# Patient Record
Sex: Male | Born: 1960
Health system: Southern US, Community
[De-identification: ages and names within clinical notes are randomized; demographics above are authoritative.]

## PROBLEM LIST (undated history)

## (undated) DIAGNOSIS — I4892 Unspecified atrial flutter: Secondary | ICD-10-CM

## (undated) DIAGNOSIS — I509 Heart failure, unspecified: Secondary | ICD-10-CM

## (undated) DIAGNOSIS — I10 Essential (primary) hypertension: Secondary | ICD-10-CM

## (undated) DIAGNOSIS — I2510 Atherosclerotic heart disease of native coronary artery without angina pectoris: Secondary | ICD-10-CM

## (undated) DIAGNOSIS — E785 Hyperlipidemia, unspecified: Secondary | ICD-10-CM

## (undated) DIAGNOSIS — I4891 Unspecified atrial fibrillation: Secondary | ICD-10-CM

## (undated) HISTORY — PX: CATARACT EXTRACTION: SUR2

## (undated) HISTORY — DX: Essential (primary) hypertension: I10

## (undated) HISTORY — PX: CARDIAC CATHETERIZATION: SHX172

---

## 2011-08-06 HISTORY — PX: MOUTH SURGERY: SHX715

## 2011-12-21 ENCOUNTER — Ambulatory Visit (INDEPENDENT_AMBULATORY_CARE_PROVIDER_SITE_OTHER): Payer: 59 | Admitting: Internal Medicine

## 2011-12-21 VITALS — BP 137/85 | HR 70 | Temp 98.1°F | Resp 16 | Ht 74.0 in | Wt 203.0 lb

## 2011-12-21 DIAGNOSIS — K122 Cellulitis and abscess of mouth: Secondary | ICD-10-CM

## 2011-12-21 DIAGNOSIS — K047 Periapical abscess without sinus: Secondary | ICD-10-CM

## 2011-12-21 DIAGNOSIS — R519 Headache, unspecified: Secondary | ICD-10-CM

## 2011-12-21 DIAGNOSIS — G501 Atypical facial pain: Secondary | ICD-10-CM

## 2011-12-21 MED ORDER — AMOXICILLIN 500 MG PO CAPS: 1000.0000 mg | ORAL_CAPSULE | Freq: Two times a day (BID) | ORAL | Status: AC

## 2011-12-21 MED ORDER — HYDROCODONE-ACETAMINOPHEN 5-500 MG PO TABS: 1.0000 | ORAL_TABLET | Freq: Three times a day (TID) | ORAL | Status: AC | PRN

## 2011-12-21 NOTE — Progress Notes (Signed)
  Subjective:    Patient ID: Christopher Gutierrez, male    DOB: 05-13-1961, 51 y.o.   MRN: [id number]  HPI C/o of abrupt onset pain L upper tooth with facial swelling.   Review of Systems     Objective:   Physical Exam Swollen tender R upper face Broken and abscessed tooth       Assessment & Plan:  See dentist Meds

## 2012-06-11 ENCOUNTER — Ambulatory Visit: Payer: 59

## 2012-06-11 ENCOUNTER — Ambulatory Visit (INDEPENDENT_AMBULATORY_CARE_PROVIDER_SITE_OTHER): Payer: 59 | Admitting: Internal Medicine

## 2012-06-11 VITALS — BP 144/92 | HR 69 | Temp 98.4°F | Resp 18 | Ht 73.75 in | Wt 200.0 lb

## 2012-06-11 DIAGNOSIS — M546 Pain in thoracic spine: Secondary | ICD-10-CM

## 2012-06-11 DIAGNOSIS — F17200 Nicotine dependence, unspecified, uncomplicated: Secondary | ICD-10-CM

## 2012-06-11 MED ORDER — HYDROCODONE-ACETAMINOPHEN 5-325 MG PO TABS: 1.0000 | ORAL_TABLET | Freq: Four times a day (QID) | ORAL | Status: DC | PRN

## 2012-06-11 MED ORDER — METHOCARBAMOL 750 MG PO TABS: 750.0000 mg | ORAL_TABLET | Freq: Four times a day (QID) | ORAL | Status: DC

## 2012-06-11 NOTE — Progress Notes (Signed)
  Subjective:    Patient ID: Christopher Gutierrez, male    DOB: January 24, 1961, 51 y.o.   MRN: [id number]  HPI Lifting about 1 mo ago and felt pain under right shoulder blade. Pain has persisted and gradually worsened. Has occ tingling into right arm but has not had neck pain. No weakness. Smoker, no hemoptysis.   Review of Systems     Objective:   Physical Exam  Vitals reviewed. Constitutional: He appears well-developed and well-nourished. No distress.  HENT:  Nose: Nose normal.  Musculoskeletal:       Thoracic back: He exhibits tenderness, bony tenderness, pain and spasm. He exhibits normal range of motion and no deformity.       Tender to palpate at and under right scapula.  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

## 2012-06-11 NOTE — Patient Instructions (Signed)
Strain thor Thoracic Strain You have injured the muscles or tendons that attach to the upper part of your back behind your chest. This injury is called a thoracic strain, thoracic sprain, or mid-back strain.  CAUSES  The cause of thoracic strain varies. A less severe injury involves pulling a muscle or tendon without tearing it. A more severe injury involves tearing (rupturing) a muscle or tendon. With less severe injuries, there may be little loss of strength. Sometimes, there are breaks (fractures) in the bones to which the muscles are attached. These fractures are rare, unless there was a direct hit (trauma) or you have weak bones due to osteoporosis or age. Longstanding strains may be caused by overuse or improper form during certain movements. Obesity can also increase your risk for back injuries. Sudden strains may occur due to injury or not warming up properly before exercise. Often, there is no obvious cause for a thoracic strain. SYMPTOMS  The main symptom is pain, especially with movement, such as during exercise. DIAGNOSIS  Your caregiver can usually tell what is wrong by taking an X-ray and doing a physical exam. TREATMENT   Physical therapy may be helpful for recovery. Your caregiver can give you exercises to do or refer you to a physical therapist after your pain improves.  After your pain improves, strengthening and conditioning programs appropriate for your sport or occupation may be helpful.  Always warm up before physical activities or athletics. Stretching after physical activity may also help.  Certain over-the-counter medicines may also help. Ask your caregiver if there are medicines that would help you. If this is your first thoracic strain injury, proper care and proper healing time before starting activities should prevent long-term problems. Torn ligaments and tendons require as long to heal as broken bones. Average healing times may be only 1 week for a mild strain. For  torn muscles and tendons, healing time may be up to 6 weeks to 2 months. HOME CARE INSTRUCTIONS   Apply ice to the injured area. Ice massages may also be used as directed.  Put ice in a plastic bag.  Place a towel between your skin and the bag.  Leave the ice on for 15 to 20 minutes, 3 to 4 times a day, for the first 2 days.  Only take over-the-counter or prescription medicines for pain, discomfort, or fever as directed by your caregiver.  Keep your appointments for physical therapy if this was prescribed.  Use wraps and back braces as instructed. SEEK IMMEDIATE MEDICAL CARE IF:   You have an increase in bruising, swelling, or pain.  Your pain has not improved with medicines.  You develop new shortness of breath, chest pain, or fever.  Problems seem to be getting worse rather than better. MAKE SURE YOU:   Understand these instructions.  Will watch your condition.  Will get help right away if you are not doing well or get worse. Document Released: 10/12/2003 Document Revised: 10/14/2011 Document Reviewed: 09/07/2010 [facility] Patient Information 2013 [place], [place].

## 2012-08-10 ENCOUNTER — Ambulatory Visit (INDEPENDENT_AMBULATORY_CARE_PROVIDER_SITE_OTHER): Payer: 59 | Admitting: Physician Assistant

## 2012-08-10 VITALS — BP 125/88 | HR 103 | Temp 98.6°F | Resp 18 | Ht 74.5 in | Wt 197.0 lb

## 2012-08-10 DIAGNOSIS — E860 Dehydration: Secondary | ICD-10-CM

## 2012-08-10 DIAGNOSIS — R197 Diarrhea, unspecified: Secondary | ICD-10-CM

## 2012-08-10 DIAGNOSIS — R1084 Generalized abdominal pain: Secondary | ICD-10-CM

## 2012-08-10 DIAGNOSIS — R112 Nausea with vomiting, unspecified: Secondary | ICD-10-CM

## 2012-08-10 LAB — COMPREHENSIVE METABOLIC PANEL
ALT: 33 U/L (ref 0–53)
AST: 52 U/L — ABNORMAL HIGH (ref 0–37)
Albumin: 4.7 g/dL (ref 3.5–5.2)
Alkaline Phosphatase: 70 U/L (ref 39–117)
BUN: 20 mg/dL (ref 6–23)
CO2: 27 mEq/L (ref 19–32)
Calcium: 9.3 mg/dL (ref 8.4–10.5)
Chloride: 96 mEq/L (ref 96–112)
Creat: 1.42 mg/dL — ABNORMAL HIGH (ref 0.50–1.35)
Glucose, Bld: 109 mg/dL — ABNORMAL HIGH (ref 70–99)
Potassium: 4.2 mEq/L (ref 3.5–5.3)
Sodium: 135 mEq/L (ref 135–145)
Total Bilirubin: 2.5 mg/dL — ABNORMAL HIGH (ref 0.3–1.2)
Total Protein: 7.2 g/dL (ref 6.0–8.3)

## 2012-08-10 MED ORDER — ONDANSETRON 4 MG PO TBDP: 4.0000 mg | ORAL_TABLET | Freq: Three times a day (TID) | ORAL | Status: DC | PRN

## 2012-08-10 MED ORDER — ONDANSETRON 4 MG PO TBDP
8.0000 mg | ORAL_TABLET | Freq: Once | ORAL | Status: AC
  Administered 2012-08-10: 8 mg via ORAL

## 2012-08-10 MED ORDER — DICYCLOMINE HCL 10 MG PO CAPS: 10.0000 mg | ORAL_CAPSULE | Freq: Three times a day (TID) | ORAL | Status: DC

## 2012-08-10 NOTE — Progress Notes (Signed)
   [address], [place] [address]   Phone [phone]  Subjective:    Patient ID: Christopher Gutierrez, male    DOB: 03-Jul-1961, 52 y.o.   MRN: [id number]  HPI Pt presents to clinic with nausea vomiting and diarrhea since Sat.  His last episode of vomiting was yesterday am and diarrhea was yesterday evening.  He has not been able to drink much and he tried to eat a small sandwich yesterday but had diarrhea right afterwards.  His abdomin is cramping terribly to the point where it hurts him to move.  He has dizziness when changing positions.  He has taken no OTC medications.  Review of Systems  Constitutional: Negative for fever and chills.  Gastrointestinal: Positive for nausea, vomiting, abdominal pain (cramping) and diarrhea. Negative for blood in stool.  Neurological: Positive for dizziness.       Objective:   Physical Exam  Vitals reviewed. Constitutional: He is oriented to person, place, and time. He appears well-developed and well-nourished.  HENT:  Head: Normocephalic and atraumatic.  Right Ear: External ear normal.  Left Ear: External ear normal.  Eyes: Conjunctivae normal are normal.  Neck: Neck supple.  Cardiovascular: Normal rate, regular rhythm and normal heart sounds.   Pulmonary/Chest: Effort normal and breath sounds normal.  Abdominal: Soft. There is tenderness (generalized). There is no rebound and no guarding.  Neurological: He is alert and oriented to person, place, and time.  Skin: Skin is warm and dry.  Psychiatric: He has a normal mood and affect. His behavior is normal. Judgment and thought content normal.    IV started - pt got 2l NS and feels much better.    Assessment & Plan:   1. Nausea & vomiting  ondansetron (ZOFRAN-ODT) disintegrating tablet 8 mg, ondansetron (ZOFRAN ODT) 4 MG disintegrating tablet  2. Diarrhea  Comprehensive metabolic panel  3. Abdominal cramping, generalized  dicyclomine (BENTYL) 10 MG capsule   Note given for work.  Pt home to  rest.  Push fluids and the 'BRAT' diet is suggested, then progress to diet as tolerated as symptoms abate. Call if bloody stools, persistent diarrhea, vomiting, fever or abdominal pain.

## 2012-08-10 NOTE — Patient Instructions (Signed)
Push fluids - start with ice and then go to clear liquids..  Advanced diet as tolerated - starting with bland foods.  B.R.A.T. Diet Your doctor has recommended the B.R.A.T. diet for you or your child until the condition improves. This is often used to help control diarrhea and vomiting symptoms. If you or your child can tolerate clear liquids, you may have:  Bananas.   Rice.   Applesauce.   Toast (and other simple starches such as crackers, potatoes, noodles).  Be sure to avoid dairy products, meats, and fatty foods until symptoms are better. Fruit juices such as apple, grape, and prune juice can make diarrhea worse. Avoid these. Continue this diet for 2 days or as instructed by your caregiver. Document Released: 07/22/2005 Document Revised: 07/11/2011 Document Reviewed: 01/08/2007 [facility] Patient Information 2012 [place], [place].

## 2012-08-14 NOTE — Addendum Note (Signed)
Addended by: Morrell Riddle on: 08/14/2012 12:36 PM   Modules accepted: Orders

## 2012-08-19 ENCOUNTER — Other Ambulatory Visit (INDEPENDENT_AMBULATORY_CARE_PROVIDER_SITE_OTHER): Payer: 59

## 2012-08-19 VITALS — BP 145/92 | HR 87

## 2012-08-19 DIAGNOSIS — R197 Diarrhea, unspecified: Secondary | ICD-10-CM

## 2012-08-19 LAB — COMPREHENSIVE METABOLIC PANEL
ALT: 36 U/L (ref 0–53)
AST: 20 U/L (ref 0–37)
Albumin: 4.7 g/dL (ref 3.5–5.2)
Alkaline Phosphatase: 67 U/L (ref 39–117)
BUN: 11 mg/dL (ref 6–23)
CO2: 26 mEq/L (ref 19–32)
Calcium: 9.7 mg/dL (ref 8.4–10.5)
Chloride: 104 mEq/L (ref 96–112)
Creat: 1.01 mg/dL (ref 0.50–1.35)
Glucose, Bld: 100 mg/dL — ABNORMAL HIGH (ref 70–99)
Potassium: 4.8 mEq/L (ref 3.5–5.3)
Sodium: 138 mEq/L (ref 135–145)
Total Bilirubin: 0.9 mg/dL (ref 0.3–1.2)
Total Protein: 6.6 g/dL (ref 6.0–8.3)

## 2014-08-05 DIAGNOSIS — Z961 Presence of intraocular lens: Secondary | ICD-10-CM

## 2014-08-05 HISTORY — DX: Presence of intraocular lens: Z96.1

## 2014-10-01 ENCOUNTER — Ambulatory Visit (INDEPENDENT_AMBULATORY_CARE_PROVIDER_SITE_OTHER): Payer: 59 | Admitting: Family Medicine

## 2014-10-01 ENCOUNTER — Other Ambulatory Visit: Payer: Self-pay | Admitting: Physician Assistant

## 2014-10-01 ENCOUNTER — Ambulatory Visit (INDEPENDENT_AMBULATORY_CARE_PROVIDER_SITE_OTHER): Payer: 59

## 2014-10-01 VITALS — BP 150/102 | HR 110 | Temp 98.4°F | Resp 16 | Ht 74.0 in | Wt 204.0 lb

## 2014-10-01 DIAGNOSIS — R0781 Pleurodynia: Secondary | ICD-10-CM

## 2014-10-01 DIAGNOSIS — R17 Unspecified jaundice: Secondary | ICD-10-CM

## 2014-10-01 DIAGNOSIS — I10 Essential (primary) hypertension: Secondary | ICD-10-CM

## 2014-10-01 LAB — COMPREHENSIVE METABOLIC PANEL
ALT: 24 U/L (ref 0–53)
AST: 21 U/L (ref 0–37)
Albumin: 4.6 g/dL (ref 3.5–5.2)
Alkaline Phosphatase: 79 U/L (ref 39–117)
BUN: 10 mg/dL (ref 6–23)
CO2: 21 mEq/L (ref 19–32)
Calcium: 10.3 mg/dL (ref 8.4–10.5)
Chloride: 100 mEq/L (ref 96–112)
Creat: 1.01 mg/dL (ref 0.50–1.35)
Glucose, Bld: 92 mg/dL (ref 70–99)
Potassium: 4.6 mEq/L (ref 3.5–5.3)
Sodium: 135 mEq/L (ref 135–145)
Total Bilirubin: 1.9 mg/dL — ABNORMAL HIGH (ref 0.2–1.2)
Total Protein: 7.3 g/dL (ref 6.0–8.3)

## 2014-10-01 MED ORDER — CYCLOBENZAPRINE HCL 5 MG PO TABS: 5.0000 mg | ORAL_TABLET | Freq: Three times a day (TID) | ORAL | Status: DC | PRN

## 2014-10-01 MED ORDER — HYDROCHLOROTHIAZIDE 12.5 MG PO TABS: 12.5000 mg | ORAL_TABLET | Freq: Every day | ORAL | Status: DC

## 2014-10-01 MED ORDER — NAPROXEN 500 MG PO TABS
500.0000 mg | ORAL_TABLET | Freq: Two times a day (BID) | ORAL | Status: DC
Start: 2014-10-01 — End: 2017-09-29

## 2014-10-01 NOTE — Progress Notes (Signed)
Subjective:    Patient ID: Christopher Gutierrez, male    DOB: 1961-04-02, 54 y.o.   MRN: [id number]  HPI  This is a 54 year old male presenting after a fall. States he tripped last night and his right posterior ribs on a corner of a dresser. Having pain with deep inspiration and movement. Pain described as throbbing and aching. Never had broken bone before. Has tried ice and helped some. He did not hit his head, no LOC. Pt does not have a primary doctor. BP is elevated here today. Never been on anything for BP. Pt is a current every day smoker - 1 ppd. Drinks a 6 pack every once in a while. No illicit drug use. Denies CP, palpitations, headache, dizziness.   Review of Systems  Constitutional: Negative for fever and chills.  Eyes: Negative for visual disturbance.  Respiratory: Negative for shortness of breath.   Cardiovascular: Negative for chest pain, palpitations and leg swelling.  Gastrointestinal: Negative for nausea and vomiting.  Musculoskeletal: Positive for myalgias and arthralgias.  Skin: Positive for color change.  Neurological: Negative for dizziness, weakness, numbness and headaches.    There are no active problems to display for this patient.  Prior to Admission medications   Not on File   No Known Allergies  Patient's social and family history were reviewed.     Objective:   Physical Exam  Constitutional: He is oriented to person, place, and time. He appears well-developed and well-nourished. No distress.  HENT:  Head: Normocephalic and atraumatic.  Right Ear: Hearing normal.  Left Ear: Hearing normal.  Nose: Nose normal.  Eyes: Conjunctivae and lids are normal. Right eye exhibits no discharge. Left eye exhibits no discharge. No scleral icterus.  Cardiovascular: Regular rhythm, normal heart sounds, intact distal pulses and normal pulses.  Tachycardia present.   Pulmonary/Chest: Effort normal and breath sounds normal. No respiratory distress. He has no decreased breath  sounds. He has no wheezes. He has no rhonchi. He has no rales.  Abdominal: Soft. Normal appearance. There is no tenderness.  Musculoskeletal: Normal range of motion.  Contusion over right posterior ribs. Tender over contusion as well as several inferior ribs Pain with spine rotation and right arm flexion/abduction  Neurological: He is alert and oriented to person, place, and time.  Skin: Skin is warm, dry and intact. No lesion and no rash noted.  Psychiatric: He has a normal mood and affect. His speech is normal and behavior is normal. Thought content normal.   BP 150/102 mmHg  Pulse 110  Temp(Src) 98.4 F (36.9 C) (Oral)  Resp 16  Ht 6\' 2"  (1.88 m)  Wt 204 lb (92.534 kg)  BMI 26.18 kg/m2  SpO2 92%  [facility] reading (PRIMARY) by  Dr. Linna Darner: normal radiograph     Assessment & Plan:  1. Rib pain on right side Naprosyn and flexeril for pain. Radiograph normal, no evidence of fracture. Will return if symptoms not improving in 2 weeks.  - DG Ribs Unilateral W/Chest Right; Future - naproxen (NAPROSYN) 500 MG tablet; Take 1 tablet (500 mg total) by mouth 2 (two) times daily with a meal.  Dispense: 30 tablet; Refill: 0 - cyclobenzaprine (FLEXERIL) 5 MG tablet; Take 1 tablet (5 mg total) by mouth 3 (three) times daily as needed for muscle spasms.  Dispense: 30 tablet; Refill: 0  2. Essential hypertension Repeat BP still 150/102. BP elevated 2 years ago here as well. After discussion decided to start on hctz. Counseled on salt, exercise,  diet, smoking cessation. He will return in 2-3 months for follow up.  - hydrochlorothiazide (HYDRODIURIL) 12.5 MG tablet; Take 1 tablet (12.5 mg total) by mouth daily.  Dispense: 30 tablet; Refill: 3 - Comprehensive metabolic panel   Benjaman Pott. Drenda Freeze, MHS [facility]  10/01/2014

## 2014-10-01 NOTE — Patient Instructions (Signed)
For rib pain, take naprosyn twice a day and flexeril 3 times a day for pain. May apply rotating ice and heat. Start taking BP med daily in the mornings. Buy a BP cuff and taking once a day. You want your BP to be <140/90. Exercise 3-4 times a week. Avoid excess salt in your diet. Start thinking about quitting smoking. Return to see me in 2-3 months for follow up.

## 2014-10-01 NOTE — Progress Notes (Signed)
PA presented the case to me. X-rays were examined and no fracture could be seen. Lung look clear. Discussed treatment plan and agreed upon it.

## 2014-10-03 LAB — BILIRUBIN, FRACTIONATED(TOT/DIR/INDIR)
Bilirubin, Direct: 0.2 mg/dL (ref 0.0–0.3)
Indirect Bilirubin: 1.4 mg/dL — ABNORMAL HIGH (ref 0.2–1.2)
Total Bilirubin: 1.6 mg/dL — ABNORMAL HIGH (ref 0.2–1.2)

## 2014-10-05 ENCOUNTER — Telehealth: Payer: Self-pay

## 2014-10-05 NOTE — Telephone Encounter (Signed)
Dr Linna Darner, your assessment and plan below. Please advise.   1. Rib pain on right side Naprosyn and flexeril for pain. Radiograph normal, no evidence of fracture. Will return if symptoms not improving in 2 weeks.  - DG Ribs Unilateral W/Chest Right; Future - naproxen (NAPROSYN) 500 MG tablet; Take 1 tablet (500 mg total) by mouth 2 (two) times daily with a meal. Dispense: 30 tablet; Refill: 0 - cyclobenzaprine (FLEXERIL) 5 MG tablet; Take 1 tablet (5 mg total) by mouth 3 (three) times daily as needed for muscle spasms. Dispense: 30 tablet; Refill: 0

## 2014-10-05 NOTE — Telephone Encounter (Signed)
Call:  Prescribe Tramadol 50 mg  # 20   Take one every 8 hours if needed for severe pain.  NR  Can continue taking naprosyn   If still needing more pain meds over the next 7-10 days needs to return for a recheck.

## 2014-10-05 NOTE — Telephone Encounter (Signed)
Patient is requesting a different pain medication. Per patient the "Naproxen" has not helped him at all. Patients call back number is [phone]

## 2014-10-06 MED ORDER — TRAMADOL HCL 50 MG PO TABS: 50.0000 mg | ORAL_TABLET | Freq: Three times a day (TID) | ORAL | Status: DC | PRN

## 2014-10-06 NOTE — Telephone Encounter (Signed)
Called pt, left message for pt to call back.

## 2014-10-06 NOTE — Telephone Encounter (Signed)
Spoke with pt, advised message from Dr. Linna Darner. Pt understood. I called in the Tramadol to his pharmacy.

## 2017-09-29 ENCOUNTER — Other Ambulatory Visit: Payer: Self-pay

## 2017-09-29 ENCOUNTER — Encounter: Payer: Self-pay | Admitting: Family Medicine

## 2017-09-29 ENCOUNTER — Ambulatory Visit: Payer: 59 | Admitting: Family Medicine

## 2017-09-29 VITALS — BP 130/88 | HR 95 | Temp 98.0°F | Resp 16 | Ht 74.0 in | Wt 214.2 lb

## 2017-09-29 DIAGNOSIS — J029 Acute pharyngitis, unspecified: Secondary | ICD-10-CM

## 2017-09-29 DIAGNOSIS — J0140 Acute pansinusitis, unspecified: Secondary | ICD-10-CM

## 2017-09-29 DIAGNOSIS — F17200 Nicotine dependence, unspecified, uncomplicated: Secondary | ICD-10-CM

## 2017-09-29 LAB — POCT SKIN KOH: Skin KOH, POC: NEGATIVE

## 2017-09-29 MED ORDER — FLUTICASONE PROPIONATE 50 MCG/ACT NA SUSP: 2.0000 | Freq: Every day | NASAL | 2 refills | Status: DC

## 2017-09-29 MED ORDER — HYDROCODONE-HOMATROPINE 5-1.5 MG/5ML PO SYRP: 5.0000 mL | ORAL_SOLUTION | Freq: Three times a day (TID) | ORAL | 0 refills | Status: DC | PRN

## 2017-09-29 MED ORDER — PSEUDOEPHEDRINE-GUAIFENESIN ER 120-1200 MG PO TB12: 1.0000 | ORAL_TABLET | Freq: Two times a day (BID) | ORAL | 0 refills | Status: DC

## 2017-09-29 MED ORDER — AMOXICILLIN-POT CLAVULANATE 875-125 MG PO TABS: 1.0000 | ORAL_TABLET | Freq: Two times a day (BID) | ORAL | 0 refills | Status: DC

## 2017-09-29 MED ORDER — IPRATROPIUM BROMIDE 0.03 % NA SOLN: 2.0000 | Freq: Four times a day (QID) | NASAL | 1 refills | Status: DC

## 2017-09-29 NOTE — Patient Instructions (Addendum)
Feel free to make an appointment to evaluate your back pain at your convenience.  You might also consider using your health insurance benefits and come in for your FREE complete physical exam your insurance covers once a year.   is now offering annual lung cancer screening by low-dose CT scan.  This would be a FREE appointment with the lung cancer screening nurse followed by a FREE lung CT scan AFTER you turn 57 yo.  Call the lung cancer screening nurse navigators at [phone] to learn more about this and get scheduled in October.  Hot showers or breathing in steam may help loosen the congestion.  Using a netti pot or sinus rinse is also likely to help you feel better and keep this from progressing.  Use the atrovent nasal spray as needed throughout the day and use the fluticasone nasal spray every night before bed for at least 2 weeks.  I recommend augmenting with 12 hr Mucinex-D to help you move out the congestion.  If no improvement or you are getting worse, come back as you might need a course of steroids but hopefully with all of the above, you can avoid it.   IF you received an x-ray today, you will receive an invoice from [facility]. Please contact [facility] at [phone] with questions or concerns regarding your invoice.   IF you received labwork today, you will receive an invoice from [place]. Please contact [facility] at [phone] with questions or concerns regarding your invoice.   Our billing staff will not be able to assist you with questions regarding bills from these companies.  You will be contacted with the lab results as soon as they are available. The fastest way to get your results is to activate your My Chart account. Instructions are located on the last page of this paperwork. If you have not heard from [place] regarding the results in 2 weeks, please contact this office.      Sinusitis, Adult Sinusitis is soreness and inflammation of your  sinuses. Sinuses are hollow spaces in the bones around your face. Your sinuses are located:  Around your eyes.  In the middle of your forehead.  Behind your nose.  In your cheekbones.  Your sinuses and nasal passages are lined with a stringy fluid (mucus). Mucus normally drains out of your sinuses. When your nasal tissues become inflamed or swollen, the mucus can become trapped or blocked so air cannot flow through your sinuses. This allows bacteria, viruses, and funguses to grow, which leads to infection. Sinusitis can develop quickly and last for 7?10 days (acute) or for more than 12 weeks (chronic). Sinusitis often develops after a cold. What are the causes? This condition is caused by anything that creates swelling in the sinuses or stops mucus from draining, including:  Allergies.  Asthma.  Bacterial or viral infection.  Abnormally shaped bones between the nasal passages.  Nasal growths that contain mucus (nasal polyps).  Narrow sinus openings.  Pollutants, such as chemicals or irritants in the air.  A foreign object stuck in the nose.  A fungal infection. This is rare.  What increases the risk? The following factors may make you more likely to develop this condition:  Having allergies or asthma.  Having had a recent cold or respiratory tract infection.  Having structural deformities or blockages in your nose or sinuses.  Having a weak immune system.  Doing a lot of swimming or diving.  Overusing nasal sprays.  Smoking.  What are  the signs or symptoms? The main symptoms of this condition are pain and a feeling of pressure around the affected sinuses. Other symptoms include:  Upper toothache.  Earache.  Headache.  Bad breath.  Decreased sense of smell and taste.  A cough that may get worse at night.  Fatigue.  Fever.  Thick drainage from your nose. The drainage is often green and it may contain pus (purulent).  Stuffy nose or  congestion.  Postnasal drip. This is when extra mucus collects in the throat or back of the nose.  Swelling and warmth over the affected sinuses.  Sore throat.  Sensitivity to light.  How is this diagnosed? This condition is diagnosed based on symptoms, a medical history, and a physical exam. To find out if your condition is acute or chronic, your health care provider may:  Look in your nose for signs of nasal polyps.  Tap over the affected sinus to check for signs of infection.  View the inside of your sinuses using an imaging device that has a light attached (endoscope).  If your health care provider suspects that you have chronic sinusitis, you may also:  Be tested for allergies.  Have a sample of mucus taken from your nose (nasal culture) and checked for bacteria.  Have a mucus sample examined to see if your sinusitis is related to an allergy.  If your sinusitis does not respond to treatment and it lasts longer than 8 weeks, you may have an MRI or CT scan to check your sinuses. These scans also help to determine how severe your infection is. In rare cases, a bone biopsy may be done to rule out more serious types of fungal sinus disease. How is this treated? Treatment for sinusitis depends on the cause and whether your condition is chronic or acute. If a virus is causing your sinusitis, your symptoms will go away on their own within 10 days. You may be given medicines to relieve your symptoms, including:  Topical nasal decongestants. They shrink swollen nasal passages and let mucus drain from your sinuses.  Antihistamines. These drugs block inflammation that is triggered by allergies. This can help to ease swelling in your nose and sinuses.  Topical nasal corticosteroids. These are nasal sprays that ease inflammation and swelling in your nose and sinuses.  Nasal saline washes. These rinses can help to get rid of thick mucus in your nose.  If your condition is caused by  bacteria, you will be given an antibiotic medicine. If your condition is caused by a fungus, you will be given an antifungal medicine. Surgery may be needed to correct underlying conditions, such as narrow nasal passages. Surgery may also be needed to remove polyps. Follow these instructions at home: Medicines  Take, use, or apply over-the-counter and prescription medicines only as told by your health care provider. These may include nasal sprays.  If you were prescribed an antibiotic medicine, take it as told by your health care provider. Do not stop taking the antibiotic even if you start to feel better. Hydrate and Humidify  Drink enough water to keep your urine clear or pale yellow. Staying hydrated will help to thin your mucus.  Use a cool mist humidifier to keep the humidity level in your home above 50%.  Inhale steam for 10-15 minutes, 3-4 times a day or as told by your health care provider. You can do this in the bathroom while a hot shower is running.  Limit your exposure to cool or dry  air. Rest  Rest as much as possible.  Sleep with your head raised (elevated).  Make sure to get enough sleep each night. General instructions  Apply a warm, moist washcloth to your face 3-4 times a day or as told by your health care provider. This will help with discomfort.  Wash your hands often with soap and water to reduce your exposure to viruses and other germs. If soap and water are not available, use hand sanitizer.  Do not smoke. Avoid being around people who are smoking (secondhand smoke).  Keep all follow-up visits as told by your health care provider. This is important. Contact a health care provider if:  You have a fever.  Your symptoms get worse.  Your symptoms do not improve within 10 days. Get help right away if:  You have a severe headache.  You have persistent vomiting.  You have pain or swelling around your face or eyes.  You have vision problems.  You  develop confusion.  Your neck is stiff.  You have trouble breathing. This information is not intended to replace advice given to you by your health care provider. Make sure you discuss any questions you have with your health care provider. Document Released: 07/22/2005 Document Revised: 03/17/2016 Document Reviewed: 05/17/2015 Elsevier Interactive Patient Education  2018 [place] Risks of Smoking Smoking cigarettes is very bad for your health. Tobacco smoke has over 200 known poisons in it. It contains the poisonous gases nitrogen oxide and carbon monoxide. There are over 60 chemicals in tobacco smoke that cause cancer. Smoking is difficult to quit because a chemical in tobacco, called nicotine, causes addiction or dependence. When you smoke and inhale, nicotine is absorbed rapidly into the bloodstream through your lungs. Both inhaled and non-inhaled nicotine may be addictive. What are the risks of cigarette smoke? Cigarette smokers have an increased risk of many serious medical problems, including:  Lung cancer.  Lung disease, such as pneumonia, bronchitis, and emphysema.  Chest pain (angina) and heart attack because the heart is not getting enough oxygen.  Heart disease and peripheral blood vessel disease.  High blood pressure (hypertension).  Stroke.  Oral cancer, including cancer of the lip, mouth, or voice box.  Bladder cancer.  Pancreatic cancer.  Cervical cancer.  Pregnancy complications, including premature birth.  Stillbirths and smaller newborn babies, birth defects, and genetic damage to sperm.  Early menopause.  Lower estrogen level for women.  Infertility.  Facial wrinkles.  Blindness.  Increased risk of broken bones (fractures).  Senile dementia.  Stomach ulcers and internal bleeding.  Delayed wound healing and increased risk of complications during surgery.  Even smoking lightly shortens your life expectancy by several  years.  Because of secondhand smoke exposure, children of smokers have an increased risk of the following:  Sudden infant death syndrome (SIDS).  Respiratory infections.  Lung cancer.  Heart disease.  Ear infections.  What are the benefits of quitting? There are many health benefits of quitting smoking. Here are some of them:  Within days of quitting smoking, your risk of having a heart attack decreases, your blood flow improves, and your lung capacity improves. Blood pressure, pulse rate, and breathing patterns start returning to normal soon after quitting.  Within months, your lungs may clear up completely.  Quitting for 10 years reduces your risk of developing lung cancer and heart disease to almost that of a nonsmoker.  People who quit may see an improvement in their overall quality of life.  How do I quit smoking? Smoking is an addiction with both physical and psychological effects, and longtime habits can be hard to change. Your health care provider can recommend:  Programs and community resources, which may include group support, education, or talk therapy.  Prescription medicines to help reduce cravings.  Nicotine replacement products, such as patches, gum, and nasal sprays. Use these products only as directed. Do not replace cigarette smoking with electronic cigarettes, which are commonly called e-cigarettes. The safety of e-cigarettes is not known, and some may contain harmful chemicals.  A combination of two or more of these methods.  Where to find more information:  American Lung Association: www.lung.org  American Cancer Society: www.cancer.org Summary  Smoking cigarettes is very bad for your health. Cigarette smokers have an increased risk of many serious medical problems, including several cancers, heart disease, and stroke.  Smoking is an addiction with both physical and psychological effects, and longtime habits can be hard to change.  By stopping right  away, you can greatly reduce the risk of medical problems for you and your family.  To help you quit smoking, your health care provider can recommend programs, community resources, prescription medicines, and nicotine replacement products such as patches, gum, and nasal sprays. This information is not intended to replace advice given to you by your health care provider. Make sure you discuss any questions you have with your health care provider. Document Released: 08/29/2004 Document Revised: 07/26/2016 Document Reviewed: 07/26/2016 Elsevier Interactive Patient Education  2017 [employer].

## 2017-09-29 NOTE — Progress Notes (Signed)
Subjective:  By signing my name below, I, Essence Howell, attest that this documentation has been prepared under the direction and in the presence of Delman Cheadle, MD Electronically Signed: Ladene Artist, ED Scribe 09/29/2017 at 9:28 AM.   Patient ID: Christopher Gutierrez, male    DOB: May 22, 1961, 57 y.o.   MRN: [id number]  Chief Complaint  Patient presents with  . URI    cough, nasal congestion yellowish mucus, hot/cold chills more at night , started last wednesday    HPI Christopher Gutierrez is a 57 y.o. male who presents to Primary Care at [facility] complaining of gradually worsening productive cough with yellow mucus onset 2 days ago. Pt reports associated symptoms of nasal congestion with clear/yellow discolored discharge x 5 days, intermittent sore throat at night, L-sided sinus pressure, night sweats, chills last night. Pt has tried Sudafed with some relief initially, none now. No antibiotic allergies. He is smoking ~1 ppd.  History reviewed. No pertinent past medical history.   Current Outpatient Medications on File Prior to Visit  Medication Sig Dispense Refill  . cyclobenzaprine (FLEXERIL) 5 MG tablet Take 1 tablet (5 mg total) by mouth 3 (three) times daily as needed for muscle spasms. (Patient not taking: Reported on 09/29/2017) 30 tablet 0  . hydrochlorothiazide (HYDRODIURIL) 12.5 MG tablet Take 1 tablet (12.5 mg total) by mouth daily. (Patient not taking: Reported on 09/29/2017) 30 tablet 3  . naproxen (NAPROSYN) 500 MG tablet Take 1 tablet (500 mg total) by mouth 2 (two) times daily with a meal. (Patient not taking: Reported on 09/29/2017) 30 tablet 0  . traMADol (ULTRAM) 50 MG tablet Take 1 tablet (50 mg total) by mouth every 8 (eight) hours as needed. (Patient not taking: Reported on 09/29/2017) 20 tablet 0   No current facility-administered medications on file prior to visit.    No Known Allergies   Past Surgical History:  Procedure Laterality Date  . MOUTH SURGERY  2013   Family History    Problem Relation Age of Onset  . Gout Brother   . Hypertension Brother   . COPD Maternal Uncle   . Birth defects Paternal Aunt    Social History   Socioeconomic History  . Marital status: Divorced    Spouse name: None  . Number of children: None  . Years of education: None  . Highest education level: None  Social Needs  . Financial resource strain: None  . Food insecurity - worry: None  . Food insecurity - inability: None  . Transportation needs - medical: None  . Transportation needs - non-medical: None  Occupational History  . None  Tobacco Use  . Smoking status: Current Every Day Smoker    Packs/day: 20.00    Types: Cigarettes  . Smokeless tobacco: Never Used  Substance and Sexual Activity  . Alcohol use: Yes    Comment: occassionally  . Drug use: No  . Sexual activity: Yes  Other Topics Concern  . None  Social History Narrative  . None   Depression screen [facility] 2/9 09/29/2017  Decreased Interest 0  Down, Depressed, Hopeless 0  PHQ - 2 Score 0    Review of Systems  Constitutional: Positive for chills and diaphoresis.  HENT: Positive for congestion, sinus pressure and sore throat.   Respiratory: Positive for cough.       Objective:   Physical Exam  Constitutional: He is oriented to person, place, and time. He appears well-developed and well-nourished. No distress.  HENT:  Head: Normocephalic and  atraumatic.  Right Ear: Tympanic membrane is injected and erythematous.  Left Ear: Tympanic membrane normal.  Nose: Rhinorrhea (and erythema) present.  Mouth/Throat: Uvula is midline. Mucous membranes are dry. No trismus in the jaw. Posterior oropharyngeal erythema (severe) present.  Tongue red, dry with tan adherent coating spotted throughout but pt asymptomatic with neg KOH on scraping.  Eyes: Conjunctivae and EOM are normal.  Neck: Neck supple. No tracheal deviation present.  Cardiovascular: Regular rhythm, S1 normal, S2 normal and normal heart sounds.  Tachycardia present.  No murmur heard. Pulmonary/Chest: Effort normal and breath sounds normal. No respiratory distress.  Lungs are clear.  Musculoskeletal: Normal range of motion.  Lymphadenopathy:       Head (right side): Submandibular and tonsillar adenopathy present.    He has cervical adenopathy (large R lower anterior cervical chain).  Neurological: He is alert and oriented to person, place, and time.  Skin: Skin is warm and dry.  Psychiatric: He has a normal mood and affect. His behavior is normal.  Nursing note and vitals reviewed.  BP 130/88   Pulse 95   Temp 98 F (36.7 C)   Resp 16   Ht 6\' 2"  (1.88 m)   Wt 214 lb 3.2 oz (97.2 kg)   SpO2 97%   BMI 27.50 kg/m     Results for orders placed or performed in visit on 09/29/17  POCT Skin KOH  Result Value Ref Range   Skin KOH, POC Negative Negative  From tongue scraping.  Assessment & Plan:   1. Acute non-recurrent pansinusitis - Augmentin.  Sig Rt-sided cervical adenopathy but Rt ear appears infected. Advised pt that adenopathy should resolve w/in 2 weeks and if not, then needs to RTC for further evaluation w/ throat clx, cbc, cxr, etc.  2. Pharyngitis, unspecified etiology - not severe - exam appeared worse than sxs, so question of thrush but KOH negative and no sxs so watchful waiting  3. Tobacco use disorder - encouraged cessation - pt not interested at this time though knows he should   Pt identifies [place] as his PCP - no interest in preventative health care - only comes here when his sxs are severe. Does not seek medical care elsewhere. Encouraged pt to take advantage of the free health insurance benefits he is paying for like CPE and cancer screening.  Orders Placed This Encounter  Procedures  . POCT Skin KOH    Meds ordered this encounter  Medications  . amoxicillin-clavulanate (AUGMENTIN) 875-125 MG tablet    Sig: Take 1 tablet by mouth 2 (two) times daily.    Dispense:  20 tablet    Refill:  0  .  fluticasone (FLONASE) 50 MCG/ACT nasal spray    Sig: Place 2 sprays into both nostrils at bedtime.    Dispense:  16 g    Refill:  2  . ipratropium (ATROVENT) 0.03 % nasal spray    Sig: Place 2 sprays into the nose 4 (four) times daily.    Dispense:  30 mL    Refill:  1  . Pseudoephedrine-Guaifenesin (MUCINEX D MAX STRENGTH) [phone] MG TB12    Sig: Take 1 tablet by mouth 2 (two) times daily.    Dispense:  28 each    Refill:  0  . DISCONTD: HYDROcodone-homatropine (HYCODAN) 5-1.5 MG/5ML syrup    Sig: Take 5 mLs by mouth every 8 (eight) hours as needed for cough.    Dispense:  180 mL    Refill:  0  . HYDROcodone-homatropine (  HYCODAN) 5-1.5 MG/5ML syrup    Sig: Take 5 mLs by mouth every 8 (eight) hours as needed for cough.    Dispense:  180 mL    Refill:  0    I personally performed the services described in this documentation, which was scribed in my presence. The recorded information has been reviewed and considered, and addended by me as needed.   Delman Cheadle, M.D.  Primary Care at [facility] [address] [place], [place] [address] [phone] phone [phone] fax  09/29/17 10:05 AM

## 2020-06-16 DIAGNOSIS — M47816 Spondylosis without myelopathy or radiculopathy, lumbar region: Secondary | ICD-10-CM | POA: Insufficient documentation

## 2020-10-12 ENCOUNTER — Other Ambulatory Visit: Payer: Self-pay

## 2020-10-12 ENCOUNTER — Ambulatory Visit (INDEPENDENT_AMBULATORY_CARE_PROVIDER_SITE_OTHER): Payer: 59 | Admitting: Family Medicine

## 2020-10-12 ENCOUNTER — Encounter: Payer: Self-pay | Admitting: Family Medicine

## 2020-10-12 VITALS — BP 151/100 | HR 95 | Temp 97.6°F | Wt 201.0 lb

## 2020-10-12 DIAGNOSIS — F1020 Alcohol dependence, uncomplicated: Secondary | ICD-10-CM

## 2020-10-12 DIAGNOSIS — I483 Typical atrial flutter: Secondary | ICD-10-CM | POA: Diagnosis not present

## 2020-10-12 DIAGNOSIS — G6289 Other specified polyneuropathies: Secondary | ICD-10-CM | POA: Diagnosis not present

## 2020-10-12 DIAGNOSIS — I499 Cardiac arrhythmia, unspecified: Secondary | ICD-10-CM | POA: Diagnosis not present

## 2020-10-12 DIAGNOSIS — G629 Polyneuropathy, unspecified: Secondary | ICD-10-CM | POA: Insufficient documentation

## 2020-10-12 DIAGNOSIS — I4891 Unspecified atrial fibrillation: Secondary | ICD-10-CM

## 2020-10-12 DIAGNOSIS — F17200 Nicotine dependence, unspecified, uncomplicated: Secondary | ICD-10-CM

## 2020-10-12 DIAGNOSIS — I4892 Unspecified atrial flutter: Secondary | ICD-10-CM | POA: Insufficient documentation

## 2020-10-12 DIAGNOSIS — I10 Essential (primary) hypertension: Secondary | ICD-10-CM

## 2020-10-12 MED ORDER — GABAPENTIN 300 MG PO CAPS: ORAL_CAPSULE | ORAL | 3 refills | Status: DC

## 2020-10-12 MED ORDER — METOPROLOL TARTRATE 50 MG PO TABS: 50.0000 mg | ORAL_TABLET | Freq: Two times a day (BID) | ORAL | 3 refills | Status: DC

## 2020-10-12 NOTE — Assessment & Plan Note (Signed)
Could be due to diabetes, chronic EtOH use or neurogenic in nature.  Check a1c. Check folate and thiamine levels.  May need to consider repeat MRI Start gabapentin 300mg  qhs for first week then may increase to TID.

## 2020-10-12 NOTE — Assessment & Plan Note (Addendum)
A. Flutter with RVR on EKG.  I do not think he needs hospitalization at this time as he is asymptomatic with this.  He has multiple risk factors including HTN, alcohol dependence, nicotine dependence.  He may also have OSA as his hgb is elevated.   Start metoprolol 50mg  BID for rate control.   CHA2DS2-VASC: 1, if A1c returns elevated this would increase his risk.  He will start 81mg  ASA for now.  Referral placed to cardiology.

## 2020-10-12 NOTE — Progress Notes (Signed)
Christopher Gutierrez - 60 y.o. male MRN [id number]  Date of birth: 08/14/1960  Subjective Chief Complaint  Patient presents with  . Establish Care    HPI Christopher Gutierrez is a 60 y.o. male here today for initial visit.  He has a history of HTN and chronic low back pain.    His concern today is pain into bilateral lower extremities. He describes burning pain, numbness, tingling in his lower legs and feet as well as cramping sensation in his calf.  He has had this for several weeks.  He does have history of low back pain and was seeing [facility] for this previously.  He didn't really have radicular symptoms with this previously.  [facility] and glucose is mildly elevated, b12 levels are normal.  He does admit to drinking at least a 6 pack of beer every night to help manage pain.  He is a smoker as welll  [facility] and he was started on lisinopril/hctz.  He is taking this daily now.  Feels occasional palpitations.  He denies chest pain, shortness of breath, headache or vision changes.   ROS:  A comprehensive ROS was completed and negative except as noted per HPI  No Known Allergies  Past Medical History:  Diagnosis Date  . Hypertension     Past Surgical History:  Procedure Laterality Date  . CATARACT EXTRACTION Right   . MOUTH SURGERY  2013    Social History   Socioeconomic History  . Marital status: Divorced    Spouse name: Not on file  . Number of children: Not on file  . Years of education: Not on file  . Highest education level: Not on file  Occupational History  . Not on file  Tobacco Use  . Smoking status: Current Every Day Smoker    Packs/day: 1.50    Years: 25.00    Pack years: 37.50    Types: Cigarettes  . Smokeless tobacco: Never Used  Vaping Use  . Vaping Use: Never used  Substance and Sexual Activity  . Alcohol use: Yes    Comment: occassionally  . Drug use: No  . Sexual  activity: Yes    Partners: Female  Other Topics Concern  . Not on file  Social History Narrative  . Not on file   Social Determinants of Health   Financial Resource Strain: Not on file  Food Insecurity: Not on file  Transportation Needs: Not on file  Physical Activity: Not on file  Stress: Not on file  Social Connections: Not on file    Family History  Problem Relation Age of Onset  . Gout Brother   . Hypertension Brother   . COPD Maternal Uncle   . Birth defects Paternal Aunt     Health Maintenance  Topic Date Due  . Hepatitis C Screening  Never done  . HIV Screening  Never done  . COLONOSCOPY (Pts 45-18yrs Insurance coverage will need to be confirmed)  Never done  . COVID-19 Vaccine (3 - Booster for Pfizer series) 06/07/2020  . INFLUENZA VACCINE  11/02/2020 (Originally 03/05/2020)  . TETANUS/TDAP  10/12/2021 (Originally 04/18/1982)  . HPV VACCINES  Aged Out     ----------------------------------------------------------------------------------------------------------------------------------------------------------------------------------------------------------------- Physical Exam BP (!) 151/100 (BP Location: Left Arm, Patient Position: Sitting, Cuff Size: Normal)   Pulse 95   Temp 97.6 F (36.4 C)   Wt 201 lb (91.2 kg)   SpO2 99%  BMI 25.81 kg/m   Physical Exam Constitutional:      Appearance: Normal appearance.  HENT:     Head: Normocephalic and atraumatic.  Eyes:     General: No scleral icterus. Cardiovascular:     Rate and Rhythm: Tachycardia present. Rhythm irregular.  Pulmonary:     Effort: Pulmonary effort is normal.     Breath sounds: Normal breath sounds.  Musculoskeletal:     Cervical back: Neck supple.     Right lower leg: No edema.     Left lower leg: No edema.     Comments: DP and PT pulses 1+ bilaterally.   Skin:    General: Skin is warm and dry.  Neurological:     General: No focal deficit present.     Mental Status: He is alert.      Motor: No weakness.     Gait: Gait normal.  Psychiatric:        Mood and Affect: Mood normal.        Behavior: Behavior normal.     ------------------------------------------------------------------------------------------------------------------------------------------------------------------------------------------------------------------- Assessment and Plan  Essential hypertension BP remains elevated.  Adding lopressor 50mg  BID in addiiton to lisinopril/hctz.  Atrial flutter ([place]) A. Flutter with RVR on EKG.  I do not think he needs hospitalization at this time as he is asymptomatic with this.  He has multiple risk factors including HTN, alcohol dependence, nicotine dependence.  He may also have OSA as his hgb is elevated.   Start metoprolol 50mg  BID for rate control.   CHA2DS2-VASC: 1, if A1c returns elevated this would increase his risk.  He will start 81mg  ASA for now.  Referral placed to cardiology.    Peripheral neuropathy Could be due to diabetes, chronic EtOH use or neurogenic in nature.  Check a1c. Check folate and thiamine levels.  May need to consider repeat MRI Start gabapentin 300mg  qhs for first week then may increase to TID.    Tobacco use disorder Counseled on smoking cessation.    Uncomplicated alcohol dependence ([place]) We discussed cutting back on EtOH intake and short and long term consequences if he continues current intake.     Meds ordered this encounter  Medications  . metoprolol tartrate (LOPRESSOR) 50 MG tablet    Sig: Take 1 tablet (50 mg total) by mouth 2 (two) times daily.    Dispense:  180 tablet    Refill:  3  . gabapentin (NEURONTIN) 300 MG capsule    Sig: Start 300mg  qhs x1 week and then may increase to TID.    Dispense:  90 capsule    Refill:  3    Return in about 1 week (around 10/19/2020) for A. fib/nerve pain.    This visit occurred during the SARS-CoV-2 public health emergency.  Safety protocols were in place, including  screening questions prior to the visit, additional usage of staff PPE, and extensive cleaning of exam room while observing appropriate contact time as indicated for disinfecting solutions.

## 2020-10-12 NOTE — Patient Instructions (Signed)
Have labs completed Continue lisinopril/hctz Start metoprolol 50mg  twice per day.  You should get a call from cardiology.  Take 81mg  aspirin daily.   Try gabapentin for nerve pain.       Atrial Fibrillation  Atrial fibrillation is a type of heartbeat that is irregular or fast. If you have this condition, your heart beats without any order. This makes it hard for your heart to pump blood in a normal way. Atrial fibrillation may come and go, or it may become a long-lasting problem. If this condition is not treated, it can put you at higher risk for stroke, heart failure, and other heart problems. What are the causes? This condition may be caused by diseases that damage the heart. They include:  High blood pressure.  Heart failure.  Heart valve disease.  Heart surgery. Other causes include:  Diabetes.  Thyroid disease.  Being overweight.  Kidney disease. Sometimes the cause is not known. What increases the risk? You are more likely to develop this condition if:  You are older.  You smoke.  You exercise often and very hard.  You have a family history of this condition.  You are a man.  You use drugs.  You drink a lot of alcohol.  You have lung conditions, such as emphysema, pneumonia, or COPD.  You have sleep apnea. What are the signs or symptoms? Common symptoms of this condition include:  A feeling that your heart is beating very fast.  Chest pain or discomfort.  Feeling short of breath.  Suddenly feeling light-headed or weak.  Getting tired easily during activity.  Fainting.  Sweating. In some cases, there are no symptoms. How is this treated? Treatment for this condition depends on underlying conditions and how you feel when you have atrial fibrillation. They include:  Medicines to: ? Prevent blood clots. ? Treat heart rate or heart rhythm problems.  Using devices, such as a pacemaker, to correct heart rhythm problems.  Doing surgery  to remove the part of the heart that sends bad signals.  Closing an area where clots can form in the heart (left atrial appendage). In some cases, your doctor will treat other underlying conditions. Follow these instructions at home: Medicines  Take over-the-counter and prescription medicines only as told by your doctor.  Do not take any new medicines without first talking to your doctor.  If you are taking blood thinners: ? Talk with your doctor before you take any medicines that have aspirin or NSAIDs, such as ibuprofen, in them. ? Take your medicine exactly as told by your doctor. Take it at the same time each day. ? Avoid activities that could hurt or bruise you. Follow instructions about how to prevent falls. ? Wear a bracelet that says you are taking blood thinners. Or, carry a card that lists what medicines you take. Lifestyle  Do not use any products that have nicotine or tobacco in them. These include cigarettes, e-cigarettes, and chewing tobacco. If you need help quitting, ask your doctor.  Eat heart-healthy foods. Talk with your doctor about the right eating plan for you.  Exercise regularly as told by your doctor.  Do not drink alcohol.  Lose weight if you are overweight.  Do not use drugs, including cannabis.      General instructions  If you have a condition that causes breathing to stop for a short period of time (apnea), treat it as told by your doctor.  Keep a healthy weight. Do not use diet pills  unless your doctor says they are safe for you. Diet pills may make heart problems worse.  Keep all follow-up visits as told by your doctor. This is important. Contact a doctor if:  You notice a change in the speed, rhythm, or strength of your heartbeat.  You are taking a blood-thinning medicine and you get more bruising.  You get tired more easily when you move or exercise.  You have a sudden change in weight. Get help right away if:  You have pain in your  chest or your belly (abdomen).  You have trouble breathing.  You have side effects of blood thinners, such as blood in your vomit, poop (stool), or pee (urine), or bleeding that cannot stop.  You have any signs of a stroke. "BE FAST" is an easy way to remember the main warning signs: ? B - Balance. Signs are dizziness, sudden trouble walking, or loss of balance. ? E - Eyes. Signs are trouble seeing or a change in how you see. ? F - Face. Signs are sudden weakness or loss of feeling in the face, or the face or eyelid drooping on one side. ? A - Arms. Signs are weakness or loss of feeling in an arm. This happens suddenly and usually on one side of the body. ? S - Speech. Signs are sudden trouble speaking, slurred speech, or trouble understanding what people say. ? T - Time. Time to call emergency services. Write down what time symptoms started.  You have other signs of a stroke, such as: ? A sudden, very bad headache with no known cause. ? Feeling like you may vomit (nausea). ? Vomiting. ? A seizure. These symptoms may be an emergency. Do not wait to see if the symptoms will go away. Get medical help right away. Call your local emergency services (911 in the [place] Do not drive yourself to the hospital.   Summary  Atrial fibrillation is a type of heartbeat that is irregular or fast.  You are at higher risk of this condition if you smoke, are older, have diabetes, or are overweight.  Follow your doctor's instructions about medicines, diet, exercise, and follow-up visits.  Get help right away if you have signs or symptoms of a stroke.  Get help right away if you cannot catch your breath, or you have chest pain or discomfort. This information is not intended to replace advice given to you by your health care provider. Make sure you discuss any questions you have with your health care provider. Document Revised: 01/13/2019 Document Reviewed: 01/13/2019 Elsevier Patient Education  [place].

## 2020-10-12 NOTE — Assessment & Plan Note (Signed)
Counseled on smoking cessation  

## 2020-10-12 NOTE — Assessment & Plan Note (Signed)
We discussed cutting back on EtOH intake and short and long term consequences if he continues current intake.

## 2020-10-12 NOTE — Assessment & Plan Note (Signed)
BP remains elevated.  Adding lopressor 50mg  BID in addiiton to lisinopril/hctz.

## 2020-10-16 ENCOUNTER — Encounter: Payer: Self-pay | Admitting: Family Medicine

## 2020-10-16 LAB — TSH: TSH: 2.06 mIU/L (ref 0.40–4.50)

## 2020-10-16 LAB — HEMOGLOBIN A1C
Hgb A1c MFr Bld: 5.3 % of total Hgb (ref <5.7)
Mean Plasma Glucose: 105 mg/dL
eAG (mmol/L): 5.8 mmol/L

## 2020-10-16 LAB — FOLATE: Folate: 13.4 ng/mL

## 2020-10-16 LAB — VITAMIN B1: Vitamin B1 (Thiamine): 9 nmol/L (ref 8–30)

## 2020-10-17 ENCOUNTER — Encounter: Payer: Self-pay | Admitting: Family Medicine

## 2020-10-17 ENCOUNTER — Ambulatory Visit (INDEPENDENT_AMBULATORY_CARE_PROVIDER_SITE_OTHER): Payer: 59

## 2020-10-17 ENCOUNTER — Ambulatory Visit: Payer: 59 | Admitting: Family Medicine

## 2020-10-17 ENCOUNTER — Other Ambulatory Visit: Payer: Self-pay

## 2020-10-17 VITALS — BP 141/113 | HR 106 | Temp 97.4°F | Wt 203.3 lb

## 2020-10-17 DIAGNOSIS — N50812 Left testicular pain: Secondary | ICD-10-CM

## 2020-10-17 DIAGNOSIS — N5089 Other specified disorders of the male genital organs: Secondary | ICD-10-CM | POA: Diagnosis not present

## 2020-10-17 DIAGNOSIS — I483 Typical atrial flutter: Secondary | ICD-10-CM | POA: Diagnosis not present

## 2020-10-17 MED ORDER — CARVEDILOL 6.25 MG PO TABS: 6.2500 mg | ORAL_TABLET | Freq: Two times a day (BID) | ORAL | 3 refills | Status: DC

## 2020-10-17 NOTE — Progress Notes (Signed)
Christopher Gutierrez - 60 y.o. male MRN [id number]  Date of birth: 1961-03-28  Subjective Chief Complaint  Patient presents with  . Testicle Pain    HPI Christopher Gutierrez is a 60 y.o. male here today with complaint of testicular pain.  I saw him last week, found to be in a. Flutter.  Started on metoprolol.  Reports that a few hours after taking metoprolol he noticed swelling and pain in his left testicle.  This has persisted over the past couple of days.  He has never had this before.  He states that he looked on google and found the metoprolol can cause testicle pain.  He has continued the medication but took only 1/2 tab this morning.   ROS:  A comprehensive ROS was completed and negative except as noted per HPI  No Known Allergies  Past Medical History:  Diagnosis Date  . Hypertension     Past Surgical History:  Procedure Laterality Date  . CATARACT EXTRACTION Right   . MOUTH SURGERY  2013    Social History   Socioeconomic History  . Marital status: Divorced    Spouse name: Not on file  . Number of children: Not on file  . Years of education: Not on file  . Highest education level: Not on file  Occupational History  . Not on file  Tobacco Use  . Smoking status: Current Every Day Smoker    Packs/day: 1.50    Years: 25.00    Pack years: 37.50    Types: Cigarettes  . Smokeless tobacco: Never Used  Vaping Use  . Vaping Use: Never used  Substance and Sexual Activity  . Alcohol use: Yes    Comment: occassionally  . Drug use: No  . Sexual activity: Yes    Partners: Female  Other Topics Concern  . Not on file  Social History Narrative  . Not on file   Social Determinants of Health   Financial Resource Strain: Not on file  Food Insecurity: Not on file  Transportation Needs: Not on file  Physical Activity: Not on file  Stress: Not on file  Social Connections: Not on file    Family History  Problem Relation Age of Onset  . Gout Brother   . Hypertension Brother   .  COPD Maternal Uncle   . Birth defects Paternal Aunt     Health Maintenance  Topic Date Due  . Hepatitis C Screening  Never done  . HIV Screening  Never done  . COLONOSCOPY (Pts 45-83yrs Insurance coverage will need to be confirmed)  Never done  . COVID-19 Vaccine (3 - Booster for Pfizer series) 06/07/2020  . INFLUENZA VACCINE  11/02/2020 (Originally 03/05/2020)  . TETANUS/TDAP  10/12/2021 (Originally 04/18/1982)  . HPV VACCINES  Aged Out     ----------------------------------------------------------------------------------------------------------------------------------------------------------------------------------------------------------------- Physical Exam BP (!) 141/113 (BP Location: Left Arm, Patient Position: Sitting, Cuff Size: Normal)   Pulse (!) 106   Temp (!) 97.4 F (36.3 C)   Wt 203 lb 4.8 oz (92.2 kg)   SpO2 100%   BMI 26.10 kg/m   Physical Exam Constitutional:      Appearance: Normal appearance.  Eyes:     General: No scleral icterus. Cardiovascular:     Rate and Rhythm: Normal rate. Rhythm irregular.  Pulmonary:     Effort: Pulmonary effort is normal.     Breath sounds: Normal breath sounds.  Genitourinary:    Comments: Left testicle tender with some scrotal swelling.  Musculoskeletal:  Cervical back: Neck supple.  Neurological:     General: No focal deficit present.     Mental Status: He is alert.  Psychiatric:        Mood and Affect: Mood normal.        Behavior: Behavior normal.     ------------------------------------------------------------------------------------------------------------------------------------------------------------------------------------------------------------------- Assessment and Plan  Pain in left testicle Stat scrotal US ordered. I have never seen metoprolol cause this side effect before and there is nothing listed on lexicomp in regards to this.   Atrial flutter ([facility]) Changing metoprolol to carvedilol due to his  concern of potential side effect from metoprolol.  He has upcoming visit with cardiology as well.    Meds ordered this encounter  Medications  . carvedilol (COREG) 6.25 MG tablet    Sig: Take 1 tablet (6.25 mg total) by mouth 2 (two) times daily with a meal.    Dispense:  60 tablet    Refill:  3    No follow-ups on file.    This visit occurred during the SARS-CoV-2 public health emergency.  Safety protocols were in place, including screening questions prior to the visit, additional usage of staff PPE, and extensive cleaning of exam room while observing appropriate contact time as indicated for disinfecting solutions.

## 2020-10-17 NOTE — Assessment & Plan Note (Signed)
Stat scrotal US ordered. I have never seen metoprolol cause this side effect before and there is nothing listed on lexicomp in regards to this.

## 2020-10-17 NOTE — Assessment & Plan Note (Signed)
Changing metoprolol to carvedilol due to his concern of potential side effect from metoprolol.  He has upcoming visit with cardiology as well.

## 2020-10-17 NOTE — Patient Instructions (Signed)
Discontinue metoprolol Start carvedilol to replace this. Have [place] completed.

## 2020-10-24 ENCOUNTER — Other Ambulatory Visit: Payer: Self-pay | Admitting: Family Medicine

## 2020-10-24 DIAGNOSIS — I4891 Unspecified atrial fibrillation: Secondary | ICD-10-CM

## 2020-10-24 DIAGNOSIS — I10 Essential (primary) hypertension: Secondary | ICD-10-CM

## 2020-10-24 HISTORY — DX: Unspecified atrial fibrillation: I48.91

## 2020-10-27 ENCOUNTER — Ambulatory Visit (INDEPENDENT_AMBULATORY_CARE_PROVIDER_SITE_OTHER): Payer: 59 | Admitting: Cardiology

## 2020-10-27 ENCOUNTER — Other Ambulatory Visit: Payer: Self-pay

## 2020-10-27 ENCOUNTER — Encounter: Payer: Self-pay | Admitting: Cardiology

## 2020-10-27 VITALS — BP 140/77 | HR 165 | Ht 74.0 in | Wt 203.4 lb

## 2020-10-27 DIAGNOSIS — Z720 Tobacco use: Secondary | ICD-10-CM

## 2020-10-27 DIAGNOSIS — I4891 Unspecified atrial fibrillation: Secondary | ICD-10-CM

## 2020-10-27 DIAGNOSIS — R600 Localized edema: Secondary | ICD-10-CM | POA: Diagnosis not present

## 2020-10-27 DIAGNOSIS — R400 Somnolence: Secondary | ICD-10-CM

## 2020-10-27 MED ORDER — APIXABAN 5 MG PO TABS: 5.0000 mg | ORAL_TABLET | Freq: Two times a day (BID) | ORAL | 3 refills | Status: DC

## 2020-10-27 NOTE — Patient Instructions (Addendum)
Medication Instructions:  START carvedilol (Coreg) 6.25 mg two times daily START Eliquis 5 mg two times daily  *If you need a refill on your cardiac medications before your next appointment, please call your pharmacy*   Lab Work: CMET, CBC, BNP today  If you have labs (blood work) drawn today and your tests are completely normal, you will receive your results only by: Marland Kitchen MyChart Message (if you have MyChart) OR . A paper copy in the mail If you have any lab test that is abnormal or we need to change your treatment, we will call you to review the results.   Testing/Procedures: TEE + cardioversion asap (see below)  Your physician has recommended that you have a sleep study. This test records several body functions during sleep, including: brain activity, eye movement, oxygen and carbon dioxide blood levels, heart rate and rhythm, breathing rate and rhythm, the flow of air through your mouth and nose, snoring, body muscle movements, and chest and belly movement. --this must be approved by insurance prior to scheduling.   Follow-Up: At [facility], you and your health needs are our priority.  As part of our continuing mission to provide you with exceptional heart care, we have created designated Provider Care Teams.  These Care Teams include your primary Cardiologist (physician) and Advanced Practice Providers (APPs -  Physician Assistants and Nurse Practitioners) who all work together to provide you with the care you need, when you need it.  We recommend signing up for the patient portal called "MyChart".  Sign up information is provided on this After Visit Summary.  MyChart is used to connect with patients for Virtual Visits (Telemedicine).  Patients are able to view lab/test results, encounter notes, upcoming appointments, etc.  Non-urgent messages can be sent to your provider as well.   To learn more about what you can do with MyChart, go to [email or web address].    Your next  appointment:   1 month(s)  The format for your next appointment:   In Person  Provider:   Oswaldo Milian, MD   Other Instructions BP and EKG on Monday 3/28 (nurse visit)   Your provider has recommended a TEE + cardioversion.   You will need COVID-19 testing prior to your procedure. Go to Pre-Procedural COVID-19 Testing Site at [facility] in [place], [place] [address]: Monday 3/28 at 11:25 AM _________________________________________________.    You are scheduled for a TEE + cardioversion on Wednesday 11/01/20 at 8 AM with Dr. Marlou Porch or associates. Please go to [facility] ([address]) 2nd [place] Stay at 7 AM.  There is free valet parking available.  Enter through the [place] not have any food or drink after midnight Tuesday night. You may take your medicines with a sip of water on the day of your procedure.    DO NOT STOP YOUR ANTICOAGULANT (BLOOD THINNER) Eliquis - YOU WILL NEED TO CONTINUE YOUR ANTICOAGULANT AFTER YOUR PROCEDURE UNTIL YOU ARE TOLD BY YOUR PROVIDER IT IS SAFE TO STOP  You will need someone to drive you home following your procedure and stay in the waiting room during your procedure. Failure to do so could result in your procedure being cancelled.   Every effort is made to have your procedure done on time. Occasionally there are emergencies that occur at the hospital that may cause delays.   Call the [place] office at [phone] if you have any questions, problems or concerns.  Electrical Cardioversion Electrical cardioversion is the delivery of a jolt of electricity to change the rhythm of the heart. Sticky patches or metal paddles are placed on the chest to deliver the electricity from a device. This is done to restore a normal rhythm. A rhythm that is too fast or not regular keeps the heart from pumping well. Electrical cardioversion is done in an emergency if:   There is low  or no blood pressure as a result of the heart rhythm.    Normal rhythm must be restored as fast as possible to protect the brain and heart from further damage.    It may save a life. Cardioversion may be done for heart rhythms that are not immediately life threatening, such as atrial fibrillation or flutter, in which:   The heart is beating too fast or is not regular.    Medicine to change the rhythm has not worked.    It is safe to wait in order to allow time for preparation.  Symptoms of the abnormal rhythm are bothersome.  The risk of stroke and other serious problems can be reduced.  LET [facility] CARE PROVIDER KNOW ABOUT:   Any allergies you have.  All medicines you are taking, including vitamins, herbs, eye drops, creams, and over-the-counter medicines.  Previous problems you or members of your family have had with the use of anesthetics.    Any blood disorders you have.    Previous surgeries you have had.    Medical conditions you have.   RISKS AND COMPLICATIONS  Generally, this is a safe procedure. However, problems can occur and include:   Breathing problems related to the anesthetic used.  A blood clot that breaks free and travels to other parts of your body. This could cause a stroke or other problems. The risk of this is lowered by use of blood-thinning medicine (anticoagulant) prior to the procedure.  Cardiac arrest (rare).   BEFORE THE PROCEDURE   You may have tests to detect blood clots in your heart and to evaluate heart function.   You may start taking anticoagulants so your blood does not clot as easily.    Medicines may be given to help stabilize your heart rate and rhythm.   PROCEDURE  You will be given medicine through an IV tube to reduce discomfort and make you sleepy (sedative).    An electrical shock will be delivered.   AFTER THE PROCEDURE Your heart rhythm will be watched to make sure it does not change. You will need someone to  drive you home.

## 2020-10-27 NOTE — Progress Notes (Signed)
Cardiology Office Note:    Date:  10/29/2020   ID:  Christopher Gutierrez, DOB Feb 05, 1961, MRN [id number]  PCP:  [name], DO  Cardiologist:  No primary care provider on file.  Electrophysiologist:  None   Referring MD: [name], DO   Chief Complaint  Patient presents with  . Atrial Fibrillation    History of Present Illness:    Christopher Gutierrez is a 60 y.o. male with a hx of HTN, tobacco use, alcohol use who is referred by Dr. [name] for evaluation of atrial fibrillation.  He reports occasional palpitations.  Denies any lightheadedness or syncope.  Does report some dyspnea on exertion.  Denies any chest pain.  Was found by Dr. [name] to have atrial fibrillation.  Started on metoprolol but reports felt dizzy and had testicular swelling so stopped taking it and symptoms resolved.  He was switched to carvedilol but has not started.  He drinks about a sixpack of beer every other day.  Smokes 1.5 to 2 packs/day, has smoked for 40 years.  .    Past Medical History:  Diagnosis Date  . Hypertension     Past Surgical History:  Procedure Laterality Date  . CATARACT EXTRACTION Right   . MOUTH SURGERY  2013    Current Medications: Current Meds  Medication Sig  . apixaban (ELIQUIS) 5 MG TABS tablet Take 1 tablet (5 mg total) by mouth 2 (two) times daily.  . carvedilol (COREG) 6.25 MG tablet Take 1 tablet (6.25 mg total) by mouth 2 (two) times daily with a meal.  . gabapentin (NEURONTIN) 300 MG capsule Start 300mg  qhs x1 week and then may increase to TID. (Patient taking differently: Take 300 mg by mouth at bedtime.)  . lisinopril-hydrochlorothiazide (ZESTORETIC) 20-25 MG tablet TAKE 1 TABLET BY MOUTH EVERY DAY (Patient taking differently: Take 1 tablet by mouth every evening.)  . methocarbamol (ROBAXIN) 500 MG tablet methocarbamol 500 mg tablet (Patient not taking: No sig reported)     Allergies:   Patient has no known allergies.   Social History   Socioeconomic History  .  Marital status: Divorced    Spouse name: Not on file  . Number of children: Not on file  . Years of education: Not on file  . Highest education level: Not on file  Occupational History  . Not on file  Tobacco Use  . Smoking status: Current Every Day Smoker    Packs/day: 1.50    Years: 25.00    Pack years: 37.50    Types: Cigarettes  . Smokeless tobacco: Never Used  Vaping Use  . Vaping Use: Never used  Substance and Sexual Activity  . Alcohol use: Yes    Comment: occassionally  . Drug use: No  . Sexual activity: Yes    Partners: Female  Other Topics Concern  . Not on file  Social History Narrative  . Not on file   Social Determinants of Health   Financial Resource Strain: Not on file  Food Insecurity: Not on file  Transportation Needs: Not on file  Physical Activity: Not on file  Stress: Not on file  Social Connections: Not on file     Family History: The patient's family history includes Birth defects in his paternal aunt; COPD in his maternal uncle; Gout in his brother; Hypertension in his brother.  ROS:   Please see the history of present illness.     All other systems reviewed and are negative.  EKGs/Labs/Other Studies Reviewed:    The  following studies were reviewed today:   EKG:  EKG is ordered today.  The ekg ordered today demonstrates atrial fibrillation with RVR, rate 165, Q waves in V1/2 and aVL, T wave inversions in leads V4-6  Recent Labs: 10/12/2020: TSH 2.06 10/27/2020: ALT 29; BNP 766.8; BUN 9; Creatinine, Ser 1.07; Hemoglobin 18.1; Platelets 147; Potassium 3.5; Sodium 141  Recent Lipid Panel No results found for: CHOL, TRIG, HDL, CHOLHDL, VLDL, LDLCALC, LDLDIRECT  Physical Exam:    VS:  BP 140/77   Pulse (!) 165   Ht 6\' 2"  (1.88 m)   Wt 203 lb 6.4 oz (92.3 kg)   SpO2 95%   BMI 26.12 kg/m     Wt Readings from Last 3 Encounters:  10/27/20 203 lb 6.4 oz (92.3 kg)  10/17/20 203 lb 4.8 oz (92.2 kg)  10/12/20 201 lb (91.2 kg)     GEN:   in no acute distress HEENT: Normal NECK: No JVD; No carotid bruits LYMPHATICS: No lymphadenopathy CARDIAC: Irregular, tachycardic, no murmurs RESPIRATORY:  Clear to auscultation without rales, wheezing or rhonchi  ABDOMEN: Soft, non-tender, non-distended MUSCULOSKELETAL: 1+ edema; No deformity  SKIN: Warm and dry NEUROLOGIC:  Alert and oriented x 3 PSYCHIATRIC:  Normal affect   ASSESSMENT:    1. Atrial fibrillation with rapid ventricular response ([place])   2. Lower leg edema   3. Tobacco use   4. Daytime somnolence    PLAN:    Atrial fibrillation: Presents with new diagnosis A. fib with RVR, rate 165 in clinic today.  He was started on carvedilol by PCP but has not started to take yet.  CHA2DS2-VASc score 1 (hypertension). -Start carvedilol -Start Eliquis 5 mg BID -Given significantly elevated rates and appears hypervolemic on exam, discussed admitting to hospital for rate control and plan TEE/DCCV.  He declines hospitalization.  Will schedule TEE/DCCV for next week.  Advised that if developing shortness of breath should proceed to the ED, he is agreeable to this.  Recommended to start carvedilol.  Will start Eliquis 5 mg twice daily for anticoagulation.  Will check CMP, CBC, BNP.  Will have patient return on Monday for BP/EKG check -Check sleep study -Recommend cutting back on alcohol intake  Daytime somnolence: Given new A. fib, will check sleep study  Tobacco use: Cessation recommended  RTC in 1 month   Medication Adjustments/Labs and Tests Ordered: Current medicines are reviewed at length with the patient today.  Concerns regarding medicines are outlined above.  Orders Placed This Encounter  Procedures  . Comprehensive metabolic panel  . CBC  . Brain natriuretic peptide  . EKG 12-Lead  . Split night study   Meds ordered this encounter  Medications  . apixaban (ELIQUIS) 5 MG TABS tablet    Sig: Take 1 tablet (5 mg total) by mouth 2 (two) times daily.    Dispense:  60  tablet    Refill:  3    Patient Instructions  Medication Instructions:  START carvedilol (Coreg) 6.25 mg two times daily START Eliquis 5 mg two times daily  *If you need a refill on your cardiac medications before your next appointment, please call your pharmacy*   Lab Work: CMET, CBC, BNP today  If you have labs (blood work) drawn today and your tests are completely normal, you will receive your results only by: Marland Kitchen MyChart Message (if you have MyChart) OR . A paper copy in the mail If you have any lab test that is abnormal or we need to change your  treatment, we will call you to review the results.   Testing/Procedures: TEE + cardioversion asap (see below)  Your physician has recommended that you have a sleep study. This test records several body functions during sleep, including: brain activity, eye movement, oxygen and carbon dioxide blood levels, heart rate and rhythm, breathing rate and rhythm, the flow of air through your mouth and nose, snoring, body muscle movements, and chest and belly movement. --this must be approved by insurance prior to scheduling.   Follow-Up: At [facility], you and your health needs are our priority.  As part of our continuing mission to provide you with exceptional heart care, we have created designated Provider Care Teams.  These Care Teams include your primary Cardiologist (physician) and Advanced Practice Providers (APPs -  Physician Assistants and Nurse Practitioners) who all work together to provide you with the care you need, when you need it.  We recommend signing up for the patient portal called "MyChart".  Sign up information is provided on this After Visit Summary.  MyChart is used to connect with patients for Virtual Visits (Telemedicine).  Patients are able to view lab/test results, encounter notes, upcoming appointments, etc.  Non-urgent messages can be sent to your provider as well.   To learn more about what you can do with MyChart, go  to [email or web address].    Your next appointment:   1 month(s)  The format for your next appointment:   In Person  Provider:   Oswaldo Milian, MD   Other Instructions BP and EKG on Monday 3/28 (nurse visit)   Your provider has recommended a TEE + cardioversion.   You will need COVID-19 testing prior to your procedure. Go to Pre-Procedural COVID-19 Testing Site at [facility] in [place], [place] [address]: Monday 3/28 at 11:25 AM _________________________________________________.    You are scheduled for a TEE + cardioversion on Wednesday 11/01/20 at 8 AM with Dr. Marlou Porch or associates. Please go to [facility] ([address] 2nd [place] Stay at 7 AM.  There is free valet parking available.  Enter through the [place] not have any food or drink after midnight Tuesday night. You may take your medicines with a sip of water on the day of your procedure.    DO NOT STOP YOUR ANTICOAGULANT (BLOOD THINNER) Eliquis - YOU WILL NEED TO CONTINUE YOUR ANTICOAGULANT AFTER YOUR PROCEDURE UNTIL YOU ARE TOLD BY YOUR PROVIDER IT IS SAFE TO STOP  You will need someone to drive you home following your procedure and stay in the waiting room during your procedure. Failure to do so could result in your procedure being cancelled.   Every effort is made to have your procedure done on time. Occasionally there are emergencies that occur at the hospital that may cause delays.   Call the [place] office at [phone] if you have any questions, problems or concerns.     Electrical Cardioversion Electrical cardioversion is the delivery of a jolt of electricity to change the rhythm of the heart. Sticky patches or metal paddles are placed on the chest to deliver the electricity from a device. This is done to restore a normal rhythm. A rhythm that is too fast or not regular keeps the heart from pumping well. Electrical cardioversion is  done in an emergency if:   There is low or no blood pressure as a result of the heart rhythm.    Normal rhythm must be restored  as fast as possible to protect the brain and heart from further damage.    It may save a life. Cardioversion may be done for heart rhythms that are not immediately life threatening, such as atrial fibrillation or flutter, in which:   The heart is beating too fast or is not regular.    Medicine to change the rhythm has not worked.    It is safe to wait in order to allow time for preparation.  Symptoms of the abnormal rhythm are bothersome.  The risk of stroke and other serious problems can be reduced.  LET [facility] CARE PROVIDER KNOW ABOUT:   Any allergies you have.  All medicines you are taking, including vitamins, herbs, eye drops, creams, and over-the-counter medicines.  Previous problems you or members of your family have had with the use of anesthetics.    Any blood disorders you have.    Previous surgeries you have had.    Medical conditions you have.   RISKS AND COMPLICATIONS  Generally, this is a safe procedure. However, problems can occur and include:   Breathing problems related to the anesthetic used.  A blood clot that breaks free and travels to other parts of your body. This could cause a stroke or other problems. The risk of this is lowered by use of blood-thinning medicine (anticoagulant) prior to the procedure.  Cardiac arrest (rare).   BEFORE THE PROCEDURE   You may have tests to detect blood clots in your heart and to evaluate heart function.   You may start taking anticoagulants so your blood does not clot as easily.    Medicines may be given to help stabilize your heart rate and rhythm.   PROCEDURE  You will be given medicine through an IV tube to reduce discomfort and make you sleepy (sedative).    An electrical shock will be delivered.   AFTER THE PROCEDURE Your heart rhythm will be watched to make sure it  does not change. You will need someone to drive you home.        Signed, [name], MD  10/29/2020 9:15 PM    [facility]

## 2020-10-28 LAB — COMPREHENSIVE METABOLIC PANEL
ALT: 29 IU/L (ref 0–44)
AST: 29 IU/L (ref 0–40)
Albumin/Globulin Ratio: 2 (ref 1.2–2.2)
Albumin: 4.3 g/dL (ref 3.8–4.9)
Alkaline Phosphatase: 106 IU/L (ref 44–121)
BUN/Creatinine Ratio: 8 — ABNORMAL LOW (ref 9–20)
BUN: 9 mg/dL (ref 6–24)
Bilirubin Total: 2.2 mg/dL — ABNORMAL HIGH (ref 0.0–1.2)
CO2: 24 mmol/L (ref 20–29)
Calcium: 9.1 mg/dL (ref 8.7–10.2)
Chloride: 97 mmol/L (ref 96–106)
Creatinine, Ser: 1.07 mg/dL (ref 0.76–1.27)
Globulin, Total: 2.2 g/dL (ref 1.5–4.5)
Glucose: 102 mg/dL — ABNORMAL HIGH (ref 65–99)
Potassium: 3.5 mmol/L (ref 3.5–5.2)
Sodium: 141 mmol/L (ref 134–144)
Total Protein: 6.5 g/dL (ref 6.0–8.5)
eGFR: 81 mL/min/{1.73_m2} (ref >59)

## 2020-10-28 LAB — CBC
Hematocrit: 52.2 % — ABNORMAL HIGH (ref 37.5–51.0)
Hemoglobin: 18.1 g/dL — ABNORMAL HIGH (ref 13.0–17.7)
MCH: 33.6 pg — ABNORMAL HIGH (ref 26.6–33.0)
MCHC: 34.7 g/dL (ref 31.5–35.7)
MCV: 97 fL (ref 79–97)
Platelets: 147 10*3/uL — ABNORMAL LOW (ref 150–450)
RBC: 5.39 x10E6/uL (ref 4.14–5.80)
RDW: 12.6 % (ref 11.6–15.4)
WBC: 8.3 10*3/uL (ref 3.4–10.8)

## 2020-10-28 LAB — BRAIN NATRIURETIC PEPTIDE: BNP: 766.8 pg/mL — ABNORMAL HIGH (ref 0.0–100.0)

## 2020-10-30 ENCOUNTER — Inpatient Hospital Stay (HOSPITAL_COMMUNITY): Payer: 59

## 2020-10-30 ENCOUNTER — Other Ambulatory Visit (HOSPITAL_COMMUNITY): Payer: 59

## 2020-10-30 ENCOUNTER — Ambulatory Visit (INDEPENDENT_AMBULATORY_CARE_PROVIDER_SITE_OTHER): Payer: 59 | Admitting: Cardiology

## 2020-10-30 ENCOUNTER — Encounter (HOSPITAL_COMMUNITY): Payer: Self-pay | Admitting: Cardiology

## 2020-10-30 ENCOUNTER — Other Ambulatory Visit: Payer: Self-pay

## 2020-10-30 ENCOUNTER — Inpatient Hospital Stay (HOSPITAL_COMMUNITY)
Admission: AD | Admit: 2020-10-30 | Discharge: 2020-11-02 | DRG: 308 | Disposition: A | Discharge status: Home / Self Care | Payer: 59 | Source: Ambulatory Visit | Attending: Cardiology | Admitting: Cardiology

## 2020-10-30 ENCOUNTER — Encounter: Payer: Self-pay | Admitting: Cardiology

## 2020-10-30 VITALS — BP 157/99 | HR 138 | Ht 74.0 in | Wt 206.6 lb

## 2020-10-30 DIAGNOSIS — I4819 Other persistent atrial fibrillation: Secondary | ICD-10-CM | POA: Diagnosis not present

## 2020-10-30 DIAGNOSIS — Z79899 Other long term (current) drug therapy: Secondary | ICD-10-CM

## 2020-10-30 DIAGNOSIS — F1010 Alcohol abuse, uncomplicated: Secondary | ICD-10-CM

## 2020-10-30 DIAGNOSIS — I5082 Biventricular heart failure: Secondary | ICD-10-CM | POA: Diagnosis not present

## 2020-10-30 DIAGNOSIS — I4891 Unspecified atrial fibrillation: Secondary | ICD-10-CM

## 2020-10-30 DIAGNOSIS — I10 Essential (primary) hypertension: Secondary | ICD-10-CM | POA: Diagnosis not present

## 2020-10-30 DIAGNOSIS — Z7289 Other problems related to lifestyle: Secondary | ICD-10-CM | POA: Diagnosis not present

## 2020-10-30 DIAGNOSIS — Z716 Tobacco abuse counseling: Secondary | ICD-10-CM

## 2020-10-30 DIAGNOSIS — Z720 Tobacco use: Secondary | ICD-10-CM | POA: Diagnosis not present

## 2020-10-30 DIAGNOSIS — E785 Hyperlipidemia, unspecified: Secondary | ICD-10-CM

## 2020-10-30 DIAGNOSIS — Z20822 Contact with and (suspected) exposure to covid-19: Secondary | ICD-10-CM | POA: Diagnosis not present

## 2020-10-30 DIAGNOSIS — F17210 Nicotine dependence, cigarettes, uncomplicated: Secondary | ICD-10-CM | POA: Diagnosis not present

## 2020-10-30 DIAGNOSIS — I110 Hypertensive heart disease with heart failure: Secondary | ICD-10-CM | POA: Diagnosis not present

## 2020-10-30 DIAGNOSIS — Z789 Other specified health status: Secondary | ICD-10-CM

## 2020-10-30 DIAGNOSIS — I5021 Acute systolic (congestive) heart failure: Secondary | ICD-10-CM | POA: Diagnosis not present

## 2020-10-30 DIAGNOSIS — F17200 Nicotine dependence, unspecified, uncomplicated: Secondary | ICD-10-CM | POA: Diagnosis present

## 2020-10-30 DIAGNOSIS — I428 Other cardiomyopathies: Secondary | ICD-10-CM | POA: Diagnosis not present

## 2020-10-30 DIAGNOSIS — Z7901 Long term (current) use of anticoagulants: Secondary | ICD-10-CM

## 2020-10-30 DIAGNOSIS — I340 Nonrheumatic mitral (valve) insufficiency: Secondary | ICD-10-CM | POA: Diagnosis not present

## 2020-10-30 HISTORY — DX: Unspecified atrial fibrillation: I48.92

## 2020-10-30 HISTORY — DX: Unspecified atrial fibrillation: I48.91

## 2020-10-30 LAB — COMPREHENSIVE METABOLIC PANEL
ALT: 30 U/L (ref 0–44)
ALT: 30 U/L (ref 0–44)
AST: 28 U/L (ref 15–41)
AST: 30 U/L (ref 15–41)
Albumin: 3.5 g/dL (ref 3.5–5.0)
Albumin: 3.6 g/dL (ref 3.5–5.0)
Alkaline Phosphatase: 76 U/L (ref 38–126)
Alkaline Phosphatase: 83 U/L (ref 38–126)
Anion gap: 7 (ref 5–15)
Anion gap: 11 (ref 5–15)
BUN: 12 mg/dL (ref 6–20)
BUN: 14 mg/dL (ref 6–20)
CO2: 32 mmol/L (ref 22–32)
CO2: 27 mmol/L (ref 22–32)
Calcium: 9.3 mg/dL (ref 8.9–10.3)
Calcium: 9.2 mg/dL (ref 8.9–10.3)
Chloride: 99 mmol/L (ref 98–111)
Chloride: 98 mmol/L (ref 98–111)
Creatinine, Ser: 1.28 mg/dL — ABNORMAL HIGH (ref 0.61–1.24)
Creatinine, Ser: 1.15 mg/dL (ref 0.61–1.24)
GFR, Estimated: >60 mL/min (ref >60)
GFR, Estimated: >60 mL/min (ref >60)
Glucose, Bld: 110 mg/dL — ABNORMAL HIGH (ref 70–99)
Glucose, Bld: 87 mg/dL (ref 70–99)
Potassium: 3.2 mmol/L — ABNORMAL LOW (ref 3.5–5.1)
Potassium: 3.3 mmol/L — ABNORMAL LOW (ref 3.5–5.1)
Sodium: 138 mmol/L (ref 135–145)
Sodium: 136 mmol/L (ref 135–145)
Total Bilirubin: 3.6 mg/dL — ABNORMAL HIGH (ref 0.3–1.2)
Total Bilirubin: 4 mg/dL — ABNORMAL HIGH (ref 0.3–1.2)
Total Protein: 6 g/dL — ABNORMAL LOW (ref 6.5–8.1)
Total Protein: 6.1 g/dL — ABNORMAL LOW (ref 6.5–8.1)

## 2020-10-30 LAB — ECHOCARDIOGRAM COMPLETE
AR max vel: 2.57 cm2
AV Area VTI: 2.21 cm2
AV Area mean vel: 2.22 cm2
AV Mean grad: 1.6 mmHg
AV Peak grad: 2.5 mmHg
Ao pk vel: 0.8 m/s
Calc EF: 33.3 %
Height: 74 in
S' Lateral: 4.8 cm
Single Plane A2C EF: 26.8 %
Single Plane A4C EF: 38.7 %
Weight: 3244.8 oz

## 2020-10-30 LAB — CBC WITH DIFFERENTIAL/PLATELET
Abs Immature Granulocytes: 0.04 10*3/uL (ref 0.00–0.07)
Basophils Absolute: 0.1 10*3/uL (ref 0.0–0.1)
Basophils Relative: 1 %
Eosinophils Absolute: 0.1 10*3/uL (ref 0.0–0.5)
Eosinophils Relative: 1 %
HCT: 51.3 % (ref 39.0–52.0)
Hemoglobin: 17.3 g/dL — ABNORMAL HIGH (ref 13.0–17.0)
Immature Granulocytes: 1 %
Lymphocytes Relative: 14 %
Lymphs Abs: 1.1 10*3/uL (ref 0.7–4.0)
MCH: 33.4 pg (ref 26.0–34.0)
MCHC: 33.7 g/dL (ref 30.0–36.0)
MCV: 99 fL (ref 80.0–100.0)
Monocytes Absolute: 0.7 10*3/uL (ref 0.1–1.0)
Monocytes Relative: 8 %
Neutro Abs: 5.9 10*3/uL (ref 1.7–7.7)
Neutrophils Relative %: 75 %
Platelets: 156 10*3/uL (ref 150–400)
RBC: 5.18 MIL/uL (ref 4.22–5.81)
RDW: 13.3 % (ref 11.5–15.5)
WBC: 7.8 10*3/uL (ref 4.0–10.5)
nRBC: 0 % (ref 0.0–0.2)

## 2020-10-30 LAB — CBC
HCT: 50.7 % (ref 39.0–52.0)
Hemoglobin: 17.7 g/dL — ABNORMAL HIGH (ref 13.0–17.0)
MCH: 33.8 pg (ref 26.0–34.0)
MCHC: 34.9 g/dL (ref 30.0–36.0)
MCV: 96.8 fL (ref 80.0–100.0)
Platelets: 159 10*3/uL (ref 150–400)
RBC: 5.24 MIL/uL (ref 4.22–5.81)
RDW: 13.5 % (ref 11.5–15.5)
WBC: 7.4 10*3/uL (ref 4.0–10.5)
nRBC: 0 % (ref 0.0–0.2)

## 2020-10-30 LAB — PHOSPHORUS: Phosphorus: 3.4 mg/dL (ref 2.5–4.6)

## 2020-10-30 LAB — MAGNESIUM: Magnesium: 1.7 mg/dL (ref 1.7–2.4)

## 2020-10-30 MED ORDER — METOPROLOL SUCCINATE ER 50 MG PO TB24
50.0000 mg | ORAL_TABLET | Freq: Every day | ORAL | Status: DC
  Administered 2020-10-30: 50 mg via ORAL
  Filled 2020-10-30: qty 1

## 2020-10-30 MED ORDER — DILTIAZEM HCL-DEXTROSE 125-5 MG/125ML-% IV SOLN (PREMIX)
5.0000 mg/h | INTRAVENOUS | Status: DC
  Administered 2020-10-30: 5 mg/h via INTRAVENOUS
  Filled 2020-10-30: qty 125

## 2020-10-30 MED ORDER — FUROSEMIDE 10 MG/ML IJ SOLN
40.0000 mg | Freq: Every day | INTRAMUSCULAR | Status: DC
  Administered 2020-10-30 – 2020-11-01 (×3): 40 mg via INTRAVENOUS
  Filled 2020-10-30 (×3): qty 4

## 2020-10-30 MED ORDER — LORAZEPAM 1 MG PO TABS
1.0000 mg | ORAL_TABLET | ORAL | Status: DC | PRN
  Administered 2020-10-31: 1 mg via ORAL
  Filled 2020-10-30: qty 1

## 2020-10-30 MED ORDER — POTASSIUM CHLORIDE CRYS ER 20 MEQ PO TBCR
40.0000 meq | EXTENDED_RELEASE_TABLET | Freq: Once | ORAL | Status: AC
  Administered 2020-10-30: 40 meq via ORAL
  Filled 2020-10-30: qty 2

## 2020-10-30 MED ORDER — FOLIC ACID 1 MG PO TABS
1.0000 mg | ORAL_TABLET | Freq: Every day | ORAL | Status: DC
Start: 2020-10-30 — End: 2020-11-02
  Administered 2020-10-30 – 2020-11-02 (×4): 1 mg via ORAL
  Filled 2020-10-30 (×4): qty 1

## 2020-10-30 MED ORDER — THIAMINE HCL 100 MG/ML IJ SOLN
100.0000 mg | Freq: Every day | INTRAMUSCULAR | Status: DC
Start: 2020-10-30 — End: 2020-11-02

## 2020-10-30 MED ORDER — ONDANSETRON HCL 4 MG/2ML IJ SOLN
4.0000 mg | Freq: Four times a day (QID) | INTRAMUSCULAR | Status: DC | PRN
  Administered 2020-11-01: 4 mg via INTRAVENOUS
  Filled 2020-10-30: qty 2

## 2020-10-30 MED ORDER — GABAPENTIN 300 MG PO CAPS
300.0000 mg | ORAL_CAPSULE | Freq: Every day | ORAL | Status: DC
  Administered 2020-10-30 – 2020-11-01 (×3): 300 mg via ORAL
  Filled 2020-10-30 (×3): qty 1

## 2020-10-30 MED ORDER — LORAZEPAM 2 MG/ML IJ SOLN: 1.0000 mg | INTRAMUSCULAR | Status: DC | PRN

## 2020-10-30 MED ORDER — APIXABAN 5 MG PO TABS
5.0000 mg | ORAL_TABLET | Freq: Two times a day (BID) | ORAL | Status: DC
  Administered 2020-10-30 – 2020-11-02 (×7): 5 mg via ORAL
  Filled 2020-10-30 (×6): qty 1

## 2020-10-30 MED ORDER — ADULT MULTIVITAMIN W/MINERALS CH
1.0000 | ORAL_TABLET | Freq: Every day | ORAL | Status: DC
Start: 2020-10-30 — End: 2020-11-02
  Administered 2020-10-30 – 2020-11-02 (×4): 1 via ORAL
  Filled 2020-10-30 (×4): qty 1

## 2020-10-30 MED ORDER — THIAMINE HCL 100 MG PO TABS
100.0000 mg | ORAL_TABLET | Freq: Every day | ORAL | Status: DC
  Administered 2020-10-30 – 2020-11-02 (×4): 100 mg via ORAL
  Filled 2020-10-30 (×4): qty 1

## 2020-10-30 MED ORDER — PRAVASTATIN SODIUM 40 MG PO TABS
40.0000 mg | ORAL_TABLET | Freq: Every day | ORAL | Status: DC
  Filled 2020-10-30: qty 1

## 2020-10-30 MED ORDER — ACETAMINOPHEN 325 MG PO TABS: 650.0000 mg | ORAL_TABLET | ORAL | Status: DC | PRN

## 2020-10-30 NOTE — Progress Notes (Signed)
Patient admitted from clinic. He has refused beta blocker (due to concern it contributed to scrotal edema?) Discussed with Dr. Gardiner Rhyme, will start on IV diltiazem and 40mg  IV lasix. Obtain TTE. Pending TEE/DCCV on Wednesday.  General: alert and oriented x3 Cardiac: HR 120-130s on arrival Lung: clear Leg: trace bilateral LE edema Abdomen: soft, nontender

## 2020-10-30 NOTE — Patient Instructions (Signed)
Proceed to [place] (Main Entrance A) at [facility]: [address] [place], [place] [address].  Admission to [facility]

## 2020-10-30 NOTE — H&P (Signed)
Cardiology Office Note:    Date:  10/30/2020   ID:  Christopher Gutierrez, DOB [birth year], MRN [id number]  PCP:  [name], DO     Cardiologist:  No primary care provider on file.  Electrophysiologist:  None   Referring MD: [name], DO      Chief Complaint  Patient presents with  . Atrial Fibrillation    History of Present Illness:    Christopher Gutierrez is a 60 y.o. male with a hx of HTN, tobacco use, alcohol use who presents for follow-up.  He was referred by Dr. [name] for evaluation of atrial fibrillation.  At initial clinic visit on 3/25, he reported occasional palpitations.  Denies any lightheadedness or syncope.  Does report some dyspnea on exertion.  Denies any chest pain.  Was found by Dr. [name] to have atrial fibrillation.  Started on metoprolol but reports felt dizzy and had testicular swelling so stopped taking it and symptoms resolved.  He was switched to carvedilol but has not started.  He drinks about a sixpack of beer every other day.  Smokes 1.5 to 2 packs/day, has smoked for 40 years.  At initial clinic visit on 3/25, he appeared hypervolemic on exam and was in A. fib with rates up to 160s.  Discussed admission to the hospital for diuresis, rate control of his A. fib and eventual TEE/DCCV, but he declined.  He was started on carvedilol for rate control, Eliquis for anticoagulation, TEE/DCCV was scheduled for 3/30.  Advised that if develops shortness of breath should go to the ED.  He reports over this weekend he startedhaving shortness of breath.  Also reports worsening swelling in his legs.  Started taking carvedilol, and reports has been feeling lightheaded and having testicular swelling since starting.  Weight is up 3 pounds from 3/25.      Wt Readings from Last 3 Encounters:  10/30/20 206 lb 9.6 oz (93.7 kg)  10/27/20 203 lb 6.4 oz (92.3 kg)  10/17/20 203 lb 4.8 oz (92.2 kg)         Past Medical History:  Diagnosis Date  . Hypertension      Past Surgical History:  Procedure Laterality Date  . CATARACT EXTRACTION Right   . MOUTH SURGERY  2013    Current Medications: Active Medications      Current Meds  Medication Sig  . apixaban (ELIQUIS) 5 MG TABS tablet Take 1 tablet (5 mg total) by mouth 2 (two) times daily.  . carvedilol (COREG) 6.25 MG tablet Take 1 tablet (6.25 mg total) by mouth 2 (two) times daily with a meal.  . gabapentin (NEURONTIN) 300 MG capsule Start 300mg  qhs x1 week and then may increase to TID. (Patient taking differently: Take 300 mg by mouth at bedtime.)  . lisinopril-hydrochlorothiazide (ZESTORETIC) 20-25 MG tablet TAKE 1 TABLET BY MOUTH EVERY DAY (Patient taking differently: Take 1 tablet by mouth every evening.)  . methocarbamol (ROBAXIN) 500 MG tablet methocarbamol 500 mg tablet  . pravastatin (PRAVACHOL) 40 MG tablet Take 40 mg by mouth daily.       Allergies:   Patient has no known allergies.   Social History        Socioeconomic History  . Marital status: Divorced    Spouse name: Not on file  . Number of children: Not on file  . Years of education: Not on file  . Highest education level: Not on file  Occupational History  . Not on file  Tobacco Use  . Smoking status:  Current Every Day Smoker    Packs/day: 1.50    Years: 25.00    Pack years: 37.50    Types: Cigarettes  . Smokeless tobacco: Never Used  Vaping Use  . Vaping Use: Never used  Substance and Sexual Activity  . Alcohol use: Yes    Comment: occassionally  . Drug use: No  . Sexual activity: Yes    Partners: Female  Other Topics Concern  . Not on file  Social History Narrative  . Not on file   Social Determinants of Health   Financial Resource Strain: Not on file  Food Insecurity: Not on file  Transportation Needs: Not on file  Physical Activity: Not on file  Stress: Not on file  Social Connections: Not on file     Family History: The patient's family history includes Birth  defects in his paternal aunt; COPD in his maternal uncle; Gout in his brother; Hypertension in his brother.  ROS:   Please see the history of present illness.     All other systems reviewed and are negative.  EKGs/Labs/Other Studies Reviewed:    The following studies were reviewed today:   EKG:  EKG is ordered today.  The ekg ordered today demonstrates atrial fibrillation with RVR, rate 136  Recent Labs: 10/12/2020: TSH 2.06 10/27/2020: ALT 29; BNP 766.8; BUN 9; Creatinine, Ser 1.07; Hemoglobin 18.1; Platelets 147; Potassium 3.5; Sodium 141  Recent Lipid Panel Labs (Brief)  No results found for: CHOL, TRIG, HDL, CHOLHDL, VLDL, LDLCALC, LDLDIRECT    Physical Exam:    VS:  BP (!) 157/99   Pulse (!) 138   Ht 6\' 2"  (1.88 m)   Wt 206 lb 9.6 oz (93.7 kg)   SpO2 98%   BMI 26.53 kg/m        Wt Readings from Last 3 Encounters:  10/30/20 206 lb 9.6 oz (93.7 kg)  10/27/20 203 lb 6.4 oz (92.3 kg)  10/17/20 203 lb 4.8 oz (92.2 kg)     GEN:  in no acute distress HEENT: Normal NECK: + JVD CARDIAC: Irregular, tachycardic, no murmurs RESPIRATORY:  Clear to auscultation without rales, wheezing or rhonchi  ABDOMEN: Soft, non-tender, non-distended MUSCULOSKELETAL: 1+ edema; No deformity  SKIN: Warm and dry NEUROLOGIC:  Alert and oriented x 3 PSYCHIATRIC:  Normal affect   ASSESSMENT:    1. Atrial fibrillation with rapid ventricular response ([place])   2. Tobacco use   3. Alcohol use    PLAN:    Atrial fibrillation: Presents with new diagnosis A. fib with RVR, rate 165 in clinic 3/25.  He was started on carvedilol by PCP but had not started to take yet.  CHA2DS2-VASc score 1 (hypertension). -Rates improved from 160s to 130s with starting carvedilol, but reports similar symptoms to when he started metoprolol.  States that he feels lightheaded and started having testicular swelling (reports same symptoms with metoprolol, which resolved when he stopped taking). -Continue  Eliquis 5 mg twice daily  -He appears more volume overloaded today, with elevated JVD and lower extremity edema.  In addition, he is now having shortness of breath.  Recommend admission to [facility] for IV diuresis, rate control of his A. fib, TTE, and plan for TEE/DCCV.  Cardioversion was scheduled for Wednesday as an outpatient, would recommend admitting for diureses and rate control prior to cardioversion.  For rate control of his A. fib, he appears to have intolerance to beta-blockers, would favor starting diltiazem if systolic function normal on echocardiogram -Direct admission to [facility]  arranged for today, he is agreeable to this  Tobacco use: Cessation recommended  Alcohol use: drinks about six pack of beer every other day, though sometimes reports drinking more than this.  Would monitor on CIWA protocol during admission  RTC in 1 month   Medication Adjustments/Labs and Tests Ordered: Current medicines are reviewed at length with the patient today.  Concerns regarding medicines are outlined above.     Orders Placed This Encounter  Procedures  . EKG 12-Lead   No orders of the defined types were placed in this encounter.   Patient Instructions  Proceed to [facility] (Main Entrance A) at [facility]: [address] [place], [place] [address].  Admission to [place], [name], MD  10/30/2020 12:23 PM    [place]

## 2020-10-30 NOTE — Progress Notes (Signed)
  Echocardiogram 2D Echocardiogram has been performed.  Christopher Gutierrez 10/30/2020, 2:42 PM

## 2020-10-30 NOTE — Progress Notes (Signed)
Cardiology Office Note:    Date:  10/30/2020   ID:  Christopher Gutierrez, DOB 03-09-1961, MRN [id number]  PCP:  [name], DO  Cardiologist:  No primary care provider on file.  Electrophysiologist:  None   Referring MD: [name], DO   Chief Complaint  Patient presents with  . Atrial Fibrillation    History of Present Illness:    Christopher Gutierrez is a 60 y.o. male with a hx of HTN, tobacco use, alcohol use who presents for follow-up.  He was referred by Dr. [name] for evaluation of atrial fibrillation.  At initial clinic visit on 3/25, he reported occasional palpitations.  Denies any lightheadedness or syncope.  Does report some dyspnea on exertion.  Denies any chest pain.  Was found by Dr. [name] to have atrial fibrillation.  Started on metoprolol but reports felt dizzy and had testicular swelling so stopped taking it and symptoms resolved.  He was switched to carvedilol but has not started.  He drinks about a sixpack of beer every other day.  Smokes 1.5 to 2 packs/day, has smoked for 40 years.  At initial clinic visit on 3/25, he appeared hypervolemic on exam and was in A. fib with rates up to 160s.  Discussed admission to the hospital for diuresis, rate control of his A. fib and eventual TEE/DCCV, but he declined.  He was started on carvedilol for rate control, Eliquis for anticoagulation, TEE/DCCV was scheduled for 3/30.  Advised that if develops shortness of breath should go to the ED.  He reports over this weekend he startedhaving shortness of breath.  Also reports worsening swelling in his legs.  Started taking carvedilol, and reports has been feeling lightheaded and having testicular swelling since starting.  Weight is up 3 pounds from 3/25.   Wt Readings from Last 3 Encounters:  10/30/20 206 lb 9.6 oz (93.7 kg)  10/27/20 203 lb 6.4 oz (92.3 kg)  10/17/20 203 lb 4.8 oz (92.2 kg)     Past Medical History:  Diagnosis Date  . Hypertension     Past Surgical History:   Procedure Laterality Date  . CATARACT EXTRACTION Right   . MOUTH SURGERY  2013    Current Medications: Current Meds  Medication Sig  . apixaban (ELIQUIS) 5 MG TABS tablet Take 1 tablet (5 mg total) by mouth 2 (two) times daily.  . carvedilol (COREG) 6.25 MG tablet Take 1 tablet (6.25 mg total) by mouth 2 (two) times daily with a meal.  . gabapentin (NEURONTIN) 300 MG capsule Start 300mg  qhs x1 week and then may increase to TID. (Patient taking differently: Take 300 mg by mouth at bedtime.)  . lisinopril-hydrochlorothiazide (ZESTORETIC) 20-25 MG tablet TAKE 1 TABLET BY MOUTH EVERY DAY (Patient taking differently: Take 1 tablet by mouth every evening.)  . methocarbamol (ROBAXIN) 500 MG tablet methocarbamol 500 mg tablet  . pravastatin (PRAVACHOL) 40 MG tablet Take 40 mg by mouth daily.     Allergies:   Patient has no known allergies.   Social History   Socioeconomic History  . Marital status: Divorced    Spouse name: Not on file  . Number of children: Not on file  . Years of education: Not on file  . Highest education level: Not on file  Occupational History  . Not on file  Tobacco Use  . Smoking status: Current Every Day Smoker    Packs/day: 1.50    Years: 25.00    Pack years: 37.50    Types: Cigarettes  .  Smokeless tobacco: Never Used  Vaping Use  . Vaping Use: Never used  Substance and Sexual Activity  . Alcohol use: Yes    Comment: occassionally  . Drug use: No  . Sexual activity: Yes    Partners: Female  Other Topics Concern  . Not on file  Social History Narrative  . Not on file   Social Determinants of Health   Financial Resource Strain: Not on file  Food Insecurity: Not on file  Transportation Needs: Not on file  Physical Activity: Not on file  Stress: Not on file  Social Connections: Not on file     Family History: The patient's family history includes Birth defects in his paternal aunt; COPD in his maternal uncle; Gout in his brother; Hypertension  in his brother.  ROS:   Please see the history of present illness.     All other systems reviewed and are negative.  EKGs/Labs/Other Studies Reviewed:    The following studies were reviewed today:   EKG:  EKG is ordered today.  The ekg ordered today demonstrates atrial fibrillation with RVR, rate 136  Recent Labs: 10/12/2020: TSH 2.06 10/27/2020: ALT 29; BNP 766.8; BUN 9; Creatinine, Ser 1.07; Hemoglobin 18.1; Platelets 147; Potassium 3.5; Sodium 141  Recent Lipid Panel No results found for: CHOL, TRIG, HDL, CHOLHDL, VLDL, LDLCALC, LDLDIRECT  Physical Exam:    VS:  BP (!) 157/99   Pulse (!) 138   Ht 6\' 2"  (1.88 m)   Wt 206 lb 9.6 oz (93.7 kg)   SpO2 98%   BMI 26.53 kg/m     Wt Readings from Last 3 Encounters:  10/30/20 206 lb 9.6 oz (93.7 kg)  10/27/20 203 lb 6.4 oz (92.3 kg)  10/17/20 203 lb 4.8 oz (92.2 kg)     GEN:  in no acute distress HEENT: Normal NECK: + JVD CARDIAC: Irregular, tachycardic, no murmurs RESPIRATORY:  Clear to auscultation without rales, wheezing or rhonchi  ABDOMEN: Soft, non-tender, non-distended MUSCULOSKELETAL: 1+ edema; No deformity  SKIN: Warm and dry NEUROLOGIC:  Alert and oriented x 3 PSYCHIATRIC:  Normal affect   ASSESSMENT:    1. Atrial fibrillation with rapid ventricular response ([place])   2. Tobacco use   3. Alcohol use    PLAN:    Atrial fibrillation: Presents with new diagnosis A. fib with RVR, rate 165 in clinic 3/25.  He was started on carvedilol by PCP but had not started to take yet.  CHA2DS2-VASc score 1 (hypertension). -Rates improved from 160s to 130s with starting carvedilol, but reports similar symptoms to when he started metoprolol.  States that he feels lightheaded and started having testicular swelling (reports same symptoms with metoprolol, which resolved when he stopped taking). -Continue Eliquis 5 mg twice daily  -He appears more volume overloaded today, with elevated JVD and lower extremity edema.  In addition, he  is now having shortness of breath.  Recommend admission to [facility] for IV diuresis, rate control of his A. fib, TTE, and plan for TEE/DCCV.  Cardioversion was scheduled for Wednesday as an outpatient, would recommend admitting for diureses and rate control prior to cardioversion.  For rate control of his A. fib, he appears to have intolerance to beta-blockers, would favor starting diltiazem if systolic function normal on echocardiogram -Direct admission to [facility] arranged for today, he is agreeable to this  Tobacco use: Cessation recommended  Alcohol use: drinks about six pack of beer every other day, though sometimes reports drinking more than this.  Would monitor  on CIWA protocol during admission  RTC in 1 month   Medication Adjustments/Labs and Tests Ordered: Current medicines are reviewed at length with the patient today.  Concerns regarding medicines are outlined above.  Orders Placed This Encounter  Procedures  . EKG 12-Lead   No orders of the defined types were placed in this encounter.   Patient Instructions  Proceed to [facility] (Main Entrance A) at [facility]: [address] [place], [place] [address].  Admission to [place], [name], MD  10/30/2020 12:23 PM    [place]

## 2020-10-30 NOTE — Discharge Instructions (Addendum)
Heart Failure Education: 1. Weigh yourself EVERY morning after you go to the bathroom but before you eat or drink anything. Write this number down in a weight log/diary. If you gain 3 pounds overnight or 5 pounds in a week, call the office. 2. Take your medicines as prescribed. If you have concerns about your medications, please call us before you stop taking them.  3. Eat low salt foods--Limit salt (sodium) to 2000 mg per day. This will help prevent your body from holding onto fluid. Read food labels as many processed foods have a lot of sodium, especially canned goods and prepackaged meats. If you would like some assistance choosing low sodium foods, we would be happy to set you up with a nutritionist. 4. Limit all fluids for the day to less than 2 liters (64 ounces). Fluid includes all drinks, coffee, juice, ice chips, soup, jello, and all other liquids. 5. Stay as active as you can everyday. Staying active will give you more energy and make your muscles stronger. Start with 5 minutes at a time and work your way up to 30 minutes a day. Break up your activities--do some in the morning and some in the afternoon. Start with 3 days per week and work your way up to 5 days as you can.  If you have chest pain, feel short of breath, dizzy, or lightheaded, STOP. If you don't feel better after a short rest, call 911. If you do feel better, call the office to let us know you have symptoms with exercise. _______________  Information on my medicine - ELIQUIS (apixaban)  Why was Eliquis prescribed for you? Eliquis was prescribed for you to reduce the risk of a blood clot forming that can cause a stroke if you have a medical condition called atrial fibrillation (a type of irregular heartbeat).  What do You need to know about Eliquis ? Take your Eliquis TWICE DAILY - one tablet in the morning and one tablet in the evening with or without food. If you have difficulty swallowing the tablet whole please discuss  with your pharmacist how to take the medication safely.  Take Eliquis exactly as prescribed by your doctor and DO NOT stop taking Eliquis without talking to the doctor who prescribed the medication.  Stopping may increase your risk of developing a stroke.  Refill your prescription before you run out.  After discharge, you should have regular check-up appointments with your healthcare provider that is prescribing your Eliquis.  In the future your dose may need to be changed if your kidney function or weight changes by a significant amount or as you get older.  What do you do if you miss a dose? If you miss a dose, take it as soon as you remember on the same day and resume taking twice daily.  Do not take more than one dose of ELIQUIS at the same time to make up a missed dose.  Important Safety Information A possible side effect of Eliquis is bleeding. You should call your healthcare provider right away if you experience any of the following: ? Bleeding from an injury or your nose that does not stop. ? Unusual colored urine (red or dark brown) or unusual colored stools (red or black). ? Unusual bruising for unknown reasons. ? A serious fall or if you hit your head (even if there is no bleeding).  Some medicines may interact with Eliquis and might increase your risk of bleeding or clotting while on Eliquis. To help avoid  this, consult your healthcare provider or pharmacist prior to using any new prescription or non-prescription medications, including herbals, vitamins, non-steroidal anti-inflammatory drugs (NSAIDs) and supplements.  This website has more information on Eliquis (apixaban): http://www.eliquis.com/eliquis/home

## 2020-10-30 NOTE — TOC Benefit Eligibility Note (Signed)
Patient Teacher, English as a foreign language completed.    The patient is currently admitted and upon discharge could be taking Eliquis 5 mg.  Eliquis is not on Formulary, Warfarin and Xarelto is on Formulary.   The patient is insured through [place] Patient Advocate Specialist [place] Team Direct Number: [phone]  Fax: [phone]

## 2020-10-30 NOTE — Progress Notes (Signed)
   10/30/20 1331  Assess: MEWS Score  Temp 98.4 F (36.9 C)  BP (!) 125/98  Pulse Rate (!) 138  Resp 19  Level of Consciousness Alert  SpO2 97 %  O2 Device Room Air  Assess: MEWS Score  MEWS Temp 0  MEWS Systolic 0  MEWS Pulse 3  MEWS RR 0  MEWS LOC 0  MEWS Score 3  MEWS Score Color Yellow  Assess: if the MEWS score is Yellow or Red  Were vital signs taken at a resting state? Yes  Focused Assessment Change from prior assessment (see assessment flowsheet)  Early Detection of Sepsis Score *See Row Information* Low  MEWS guidelines implemented *See Row Information* Yes  Treat  MEWS Interventions Administered scheduled meds/treatments  Pain Scale 0-10  Pain Score 0  Patients Stated Pain Goal 0  Take Vital Signs  Increase Vital Sign Frequency  Yellow: Q 2hr X 2 then Q 4hr X 2, if remains yellow, continue Q 4hrs  Escalate  MEWS: Escalate Yellow: discuss with charge nurse/RN and consider discussing with provider and RRT  Notify: Charge Nurse/RN  Name of Charge Nurse/RN Notified Christy, RN  Date Charge Nurse/RN Notified 10/30/20  Time Charge Nurse/RN Notified 1345  Document  Patient Outcome Other (Comment) (pt stable remains on unit, admitted with afib rvr)  Progress note created (see row info) Yes

## 2020-10-31 ENCOUNTER — Telehealth: Payer: Self-pay | Admitting: *Deleted

## 2020-10-31 DIAGNOSIS — I10 Essential (primary) hypertension: Secondary | ICD-10-CM

## 2020-10-31 DIAGNOSIS — F1010 Alcohol abuse, uncomplicated: Secondary | ICD-10-CM

## 2020-10-31 DIAGNOSIS — Z720 Tobacco use: Secondary | ICD-10-CM

## 2020-10-31 DIAGNOSIS — I4891 Unspecified atrial fibrillation: Secondary | ICD-10-CM | POA: Diagnosis not present

## 2020-10-31 DIAGNOSIS — I5021 Acute systolic (congestive) heart failure: Secondary | ICD-10-CM

## 2020-10-31 DIAGNOSIS — E785 Hyperlipidemia, unspecified: Secondary | ICD-10-CM | POA: Diagnosis not present

## 2020-10-31 LAB — BASIC METABOLIC PANEL
Anion gap: 9 (ref 5–15)
BUN: 19 mg/dL (ref 6–20)
CO2: 28 mmol/L (ref 22–32)
Calcium: 9.5 mg/dL (ref 8.9–10.3)
Chloride: 101 mmol/L (ref 98–111)
Creatinine, Ser: 1.33 mg/dL — ABNORMAL HIGH (ref 0.61–1.24)
GFR, Estimated: >60 mL/min (ref >60)
Glucose, Bld: 103 mg/dL — ABNORMAL HIGH (ref 70–99)
Potassium: 3.1 mmol/L — ABNORMAL LOW (ref 3.5–5.1)
Sodium: 138 mmol/L (ref 135–145)

## 2020-10-31 LAB — LIPID PANEL
Cholesterol: 153 mg/dL (ref 0–200)
HDL: 36 mg/dL — ABNORMAL LOW (ref >40)
LDL Cholesterol: 103 mg/dL — ABNORMAL HIGH (ref 0–99)
Total CHOL/HDL Ratio: 4.3 RATIO
Triglycerides: 68 mg/dL (ref <150)
VLDL: 14 mg/dL (ref 0–40)

## 2020-10-31 LAB — SARS CORONAVIRUS 2 (TAT 6-24 HRS): SARS Coronavirus 2: NEGATIVE

## 2020-10-31 LAB — PROTIME-INR
INR: 1.4 — ABNORMAL HIGH (ref 0.8–1.2)
Prothrombin Time: 16.4 seconds — ABNORMAL HIGH (ref 11.4–15.2)

## 2020-10-31 MED ORDER — NICOTINE 21 MG/24HR TD PT24
21.0000 mg | MEDICATED_PATCH | Freq: Every day | TRANSDERMAL | Status: DC
  Administered 2020-10-31: 21 mg via TRANSDERMAL
  Filled 2020-10-31 (×2): qty 1

## 2020-10-31 MED ORDER — METOPROLOL SUCCINATE ER 50 MG PO TB24
75.0000 mg | ORAL_TABLET | Freq: Every day | ORAL | Status: DC
  Administered 2020-10-31 – 2020-11-01 (×2): 75 mg via ORAL
  Filled 2020-10-31 (×2): qty 1

## 2020-10-31 MED ORDER — ENSURE ENLIVE PO LIQD
237.0000 mL | Freq: Two times a day (BID) | ORAL | Status: DC
  Administered 2020-10-31: 237 mL via ORAL

## 2020-10-31 MED ORDER — LOSARTAN POTASSIUM 25 MG PO TABS
25.0000 mg | ORAL_TABLET | Freq: Every day | ORAL | Status: DC
  Administered 2020-10-31 – 2020-11-01 (×2): 25 mg via ORAL
  Filled 2020-10-31 (×2): qty 1

## 2020-10-31 MED ORDER — POTASSIUM CHLORIDE CRYS ER 20 MEQ PO TBCR
40.0000 meq | EXTENDED_RELEASE_TABLET | Freq: Once | ORAL | Status: AC
  Administered 2020-10-31: 40 meq via ORAL
  Filled 2020-10-31: qty 2

## 2020-10-31 MED ORDER — POTASSIUM CHLORIDE CRYS ER 20 MEQ PO TBCR
40.0000 meq | EXTENDED_RELEASE_TABLET | Freq: Two times a day (BID) | ORAL | Status: DC
  Administered 2020-10-31 (×2): 40 meq via ORAL
  Filled 2020-10-31 (×3): qty 2

## 2020-10-31 MED ORDER — POTASSIUM CHLORIDE CRYS ER 20 MEQ PO TBCR: 40.0000 meq | EXTENDED_RELEASE_TABLET | Freq: Once | ORAL | Status: DC

## 2020-10-31 MED ORDER — MAGNESIUM SULFATE 2 GM/50ML IV SOLN
2.0000 g | Freq: Once | INTRAVENOUS | Status: AC
  Administered 2020-10-31: 2 g via INTRAVENOUS
  Filled 2020-10-31: qty 50

## 2020-10-31 MED ORDER — ATORVASTATIN CALCIUM 40 MG PO TABS
40.0000 mg | ORAL_TABLET | Freq: Every day | ORAL | Status: DC
  Administered 2020-10-31 – 2020-11-01 (×2): 40 mg via ORAL
  Filled 2020-10-31 (×2): qty 1

## 2020-10-31 MED ORDER — METOPROLOL SUCCINATE ER 100 MG PO TB24: 100.0000 mg | ORAL_TABLET | Freq: Every day | ORAL | Status: DC

## 2020-10-31 MED ORDER — SODIUM CHLORIDE 0.9 % IV SOLN: INTRAVENOUS | Status: DC

## 2020-10-31 NOTE — Progress Notes (Signed)
Initial Nutrition Assessment  DOCUMENTATION CODES:   Not applicable  INTERVENTION:  Provide Ensure Enlive po BID, each supplement provides 350 kcal and 20 grams of protein  Encourage adequate PO intake.  NUTRITION DIAGNOSIS:   Increased nutrient needs related to chronic illness (CHF) as evidenced by estimated needs.  GOAL:   Patient will meet greater than or equal to 90% of their needs  MONITOR:   PO intake,Supplement acceptance,Skin,Weight trends,Labs,I & O's  REASON FOR ASSESSMENT:   Malnutrition Screening Tool    ASSESSMENT:   60 y.o. male with a history of recently diagnosed atrial fibrillation, hypertension, hyperlipidemia, tobacco use, and alcohol use. Pt presents with lightheadedness, worsening edema, and 3lb weight gain. Pt with atrial fibrillation with RVR, acute systolic CHF, and volume overload.  Pt undergoing IV diuresis. Plans for cardioversion tomorrow. RD to order nutritional supplements to aid in caloric and protein needs. Unable to complete Nutrition-Focused physical exam at this time. Labs and medications reviewed.   Diet Order:   Diet Order            Diet NPO time specified  Diet effective midnight           Diet NPO time specified  Diet effective midnight           Diet heart healthy/carb modified Room service appropriate? Yes; Fluid consistency: Thin  Diet effective now                 EDUCATION NEEDS:   Not appropriate for education at this time  Skin:  Skin Assessment: Reviewed RN Assessment  Last BM:  3/28  Height:   Ht Readings from Last 1 Encounters:  10/30/20 6\' 2"  (1.88 m)    Weight:   Wt Readings from Last 1 Encounters:  10/31/20 89.1 kg   BMI:  Body mass index is 25.22 kg/m.  Estimated Nutritional Needs:   Kcal:  2200-2400  Protein:  110-120 grams  Fluid:  2L/day  Corrin Parker, MS, RD, LDN RD pager number/after hours weekend pager number on Amion.

## 2020-10-31 NOTE — Progress Notes (Signed)
Heart Failure Nurse Navigator Progress Note  Attempted to interview/screen patient for TOC readiness. Pt requested to wait until tomorrow, attempting to nap at this time.   Will screen tomorrow, hopefully prior to planned DCCV.   Pricilla Holm, RN, BSN Heart Failure Nurse Navigator [phone]

## 2020-10-31 NOTE — Progress Notes (Signed)
Heart Failure Stewardship Pharmacist Progress Note   PCP: [name], DO PCP-Cardiologist: [name], MD    HPI:  60 yo M with PMH of atrial fibrillation, HTN, HLD, tobacco use, and alcohol use. He presented to outpatient cardiology visit on 10/30/20 for follow up. He was then directly admitted to [facility] for IV diuresis prior to elective TEE/DCCV. Anticipated cardioversion for 11/01/20. An ECHO was done on 10/30/20 and LVEF was 20-25% with moderately reduced RV systolic function.  Current HF Medications: Furosemide 40 mg IV daily Metoprolol XL 75 mg daily Losartan 25 mg daily  Prior to admission HF Medications: Lisinopril 20 mg daily  Pertinent Lab Values: . Serum creatinine 1.33, BUN 19, Potassium 3.1, Sodium 138, BNP 766.8, Magnesium 1.7   Vital Signs: . Weight: 196 lbs (admission weight: 202 lbs) . Blood pressure: 130-140/100s  . Heart rate: 90s   Medication Assistance / Insurance Benefits Check: Does the patient have prescription insurance?  Yes Type of insurance plan: [facility] - Pharmacist, community  Outpatient Pharmacy:  Prior to admission outpatient [facility] Is the patient willing to use [facility] at discharge? Yes Is the patient willing to transition their outpatient pharmacy to utilize a [facility] outpatient pharmacy?   Pending    Assessment: 1. Acute on chronic systolic CHF (EF 35-32%), likely due to atrial fibrillation, no ischemic evaluation completed yet. NYHA class III symptoms. - Continue furosemide 40 mg IV daily. Potassium and magnesium replaced. - Continue metoprolol XL 75 mg daily - On losartan 25 mg daily - would optimize to Entresto 24/26 mg BID for further HF optimization with reduced EF - Consider staring spironolactone and/or SGLT2i prior to discharge   Plan: 1) Medication changes recommended at this time: - Stop losartan and start Entresto 24/26 mg BID in AM  2) Patient assistance: - Farxiga copay $12.66 per month - can  enroll in copay card to lower copay to $0 per month - Entresto copay $517.58 (claims state $246 deductible remaining) - can enroll in copay card to lower copay to $10 per 30-90 day supply  3)  Education  - To be completed prior to discharge  Kerby Nora, PharmD, BCPS Heart Failure Stewardship Pharmacist Phone [phone]

## 2020-10-31 NOTE — Anesthesia Preprocedure Evaluation (Addendum)
Anesthesia Evaluation  Patient identified by MRN, date of birth, ID band Patient awake    Reviewed: Allergy & Precautions, H&P , NPO status , Patient's Chart, lab work & pertinent test results  Airway Mallampati: II  TM Distance: >3 FB Neck ROM: Full    Dental no notable dental hx. (+) Poor Dentition, Dental Advisory Given   Pulmonary Current Smoker and Patient abstained from smoking.,    Pulmonary exam normal breath sounds clear to auscultation       Cardiovascular Exercise Tolerance: Good hypertension, Pt. on medications and Pt. on home beta blockers +CHF  + dysrhythmias Atrial Fibrillation  Rhythm:Irregular Rate:Tachycardia     Neuro/Psych negative neurological ROS  negative psych ROS   GI/Hepatic negative GI ROS, Neg liver ROS,   Endo/Other  negative endocrine ROS  Renal/GU negative Renal ROS  negative genitourinary   Musculoskeletal  (+) Arthritis , Osteoarthritis,    Abdominal   Peds  Hematology negative hematology ROS (+)   Anesthesia Other Findings   Reproductive/Obstetrics negative OB ROS                            Anesthesia Physical Anesthesia Plan  ASA: IV  Anesthesia Plan: MAC   Post-op Pain Management:    Induction: Intravenous  PONV Risk Score and Plan: 0 and Propofol infusion  Airway Management Planned: Nasal Cannula  Additional Equipment:   Intra-op Plan:   Post-operative Plan:   Informed Consent: I have reviewed the patients History and Physical, chart, labs and discussed the procedure including the risks, benefits and alternatives for the proposed anesthesia with the patient or authorized representative who has indicated his/her understanding and acceptance.     Dental advisory given  Plan Discussed with: CRNA  Anesthesia Plan Comments:        Anesthesia Quick Evaluation

## 2020-10-31 NOTE — Progress Notes (Addendum)
Progress Note  Patient Name: Christopher Gutierrez Date of Encounter: 10/31/2020  [facility] Cardiologist: [name], MD   Subjective   No acute overnight events. Patient feels like he is breathing a little better but he has not been up out of bed yet today. No chest pain. No awareness of atrial fibrillation - no palpitations or  lightheadedness/dizziness.  Inpatient Medications    Scheduled Meds: . apixaban  5 mg Oral BID  . folic acid  1 mg Oral Daily  . furosemide  40 mg Intravenous Daily  . gabapentin  300 mg Oral QHS  . metoprolol succinate  50 mg Oral Daily  . multivitamin with minerals  1 tablet Oral Daily  . pravastatin  40 mg Oral Daily  . thiamine  100 mg Oral Daily   Or  . thiamine  100 mg Intravenous Daily   Continuous Infusions:  PRN Meds: acetaminophen, LORazepam **OR** LORazepam, ondansetron (ZOFRAN) IV   Vital Signs    Vitals:   10/30/20 1753 10/30/20 1900 10/30/20 2055 10/31/20 0255  BP: (!) 138/125 (!) 125/94 (!) 118/103 (!) 137/110  Pulse: (!) 116 86 69 (!) 106  Resp: 17 16 18 20   Temp: 97.7 F (36.5 C) 98.7 F (37.1 C) 98.9 F (37.2 C) 98.9 F (37.2 C)  TempSrc: Oral Oral Oral Oral  SpO2: 98% 98% 95% 95%  Weight:    89.1 kg  Height:        Intake/Output Summary (Last 24 hours) at 10/31/2020 0746 Last data filed at 10/31/2020 0300 Gross per 24 hour  Intake 482.53 ml  Output --  Net 482.53 ml   Last 3 Weights 10/31/2020 10/30/2020 10/30/2020  Weight (lbs) 196 lb 6.4 oz 202 lb 12.8 oz 206 lb 9.6 oz  Weight (kg) 89.086 kg 91.989 kg 93.713 kg      Telemetry    Atrial fibrillation with rates in the 90's to low 100's. - Personally Reviewed  ECG    No new ECG tracing today. - Personally Reviewed  Physical Exam   GEN: No acute distress.   Neck: No JVD with patient sitting upright. Cardiac: Tachycardic with irregularly irregular rhythm. No murmurs, rubs, or gallops.  Respiratory: Clear to auscultation bilaterally. No  significant wheezes, rhonchi, or rales. GI: Soft, non-distended, and non-tender. Bowel sounds present. MS: 1+ pitting edema bilaterally. No deformity. Skin: Warm and dry. Neuro:  No focal deficits. Psych: Normal affect. Responds appropriately.  Labs    High Sensitivity Troponin:  No results for input(s): TROPONINIHS in the last 720 hours.    Chemistry Recent Labs  Lab 10/27/20 1252 10/30/20 1505 10/30/20 1821 10/31/20 0243  NA 141 138 136 138  K 3.5 3.2* 3.3* 3.1*  CL 97 99 98 101  CO2 24 32 27 28  GLUCOSE 102* 110* 87 103*  BUN 9 12 14 19   CREATININE 1.07 1.28* 1.15 1.33*  CALCIUM 9.1 9.3 9.2 9.5  PROT 6.5 6.0* 6.1*  --   ALBUMIN 4.3 3.5 3.6  --   AST 29 28 30   --   ALT 29 30 30   --   ALKPHOS 106 76 83  --   BILITOT 2.2* 3.6* 4.0*  --   GFRNONAA  --  >60 >60 >60  ANIONGAP  --  7 11 9      Hematology Recent Labs  Lab 10/27/20 1252 10/30/20 1505 10/30/20 1821  WBC 8.3 7.8 7.4  RBC 5.39 5.18 5.24  HGB 18.1* 17.3* 17.7*  HCT 52.2* 51.3 50.7  MCV 97 99.0 96.8  MCH 33.6* 33.4 33.8  MCHC 34.7 33.7 34.9  RDW 12.6 13.3 13.5  PLT 147* 156 159    BNP Recent Labs  Lab 10/27/20 1252  BNP 766.8*     DDimer No results for input(s): DDIMER in the last 168 hours.   Radiology    ECHOCARDIOGRAM COMPLETE  Result Date: 10/30/2020    ECHOCARDIOGRAM REPORT   Patient Name:   Christopher Gutierrez Date of Exam: 10/30/2020 Medical Rec #:  [id number]      Height:       74.0 in Accession #:    [id number]     Weight:       202.8 lb Date of Birth:  1960/09/03      BSA:          2.186 m Patient Age:    60 years       BP:           157/99 mmHg Patient Gender: M              HR:           126 bpm. Exam Location:  Inpatient Procedure: 2D Echo, Cardiac Doppler and Color Doppler Indications:    Atrial fibrillation  History:        Patient has no prior history of Echocardiogram examinations.                 Risk Factors:Current Smoker.  Sonographer:    Clayton Lefort RDCS (AE) Referring Phys: [id number]  HAO MENG IMPRESSIONS  1. Left ventricular ejection fraction, by estimation, is 20 to 25%. The left ventricle has severely decreased function. The left ventricle demonstrates global hypokinesis. There is mild left ventricular hypertrophy. Left ventricular diastolic parameters  are indeterminate.  2. Right ventricular systolic function is moderately reduced. The right ventricular size is normal. Tricuspid regurgitation signal is inadequate for assessing PA pressure.  3. Left atrial size was mildly dilated.  4. Right atrial size was mildly dilated.  5. The mitral valve is normal in structure. Trivial mitral valve regurgitation. No evidence of mitral stenosis.  6. The aortic valve is tricuspid. Aortic valve regurgitation is not visualized. No aortic stenosis is present.  7. Aortic dilatation noted. There is mild dilatation of the aortic root, measuring 37 mm.  8. The inferior vena cava is normal in size with greater than 50% respiratory variability, suggesting right atrial pressure of 3 mmHg.  9. The patient was in atrial fibrillation. FINDINGS  Left Ventricle: Left ventricular ejection fraction, by estimation, is 20 to 25%. The left ventricle has severely decreased function. The left ventricle demonstrates global hypokinesis. The left ventricular internal cavity size was normal in size. There is mild left ventricular hypertrophy. Left ventricular diastolic parameters are indeterminate. Right Ventricle: The right ventricular size is normal. No increase in right ventricular wall thickness. Right ventricular systolic function is moderately reduced. Tricuspid regurgitation signal is inadequate for assessing PA pressure. Left Atrium: Left atrial size was mildly dilated. Right Atrium: Right atrial size was mildly dilated. Pericardium: There is no evidence of pericardial effusion. Mitral Valve: The mitral valve is normal in structure. Trivial mitral valve regurgitation. No evidence of mitral valve stenosis. Tricuspid Valve:  The tricuspid valve is normal in structure. Tricuspid valve regurgitation is not demonstrated. Aortic Valve: The aortic valve is tricuspid. Aortic valve regurgitation is not visualized. No aortic stenosis is present. Aortic valve mean gradient measures 1.6 mmHg. Aortic valve peak gradient measures 2.5 mmHg. Aortic valve  area, by VTI measures 2.21 cm. Pulmonic Valve: The pulmonic valve was normal in structure. Pulmonic valve regurgitation is not visualized. Aorta: Aortic dilatation noted. There is mild dilatation of the aortic root, measuring 37 mm. Venous: The inferior vena cava is normal in size with greater than 50% respiratory variability, suggesting right atrial pressure of 3 mmHg. IAS/Shunts: No atrial level shunt detected by color flow Doppler.  LEFT VENTRICLE PLAX 2D LVIDd:         5.30 cm LVIDs:         4.80 cm LV PW:         1.40 cm LV IVS:        1.40 cm LVOT diam:     2.00 cm LV SV:         25 LV SV Index:   11 LVOT Area:     3.14 cm  LV Volumes (MOD) LV vol d, MOD A2C: 179.0 ml LV vol d, MOD A4C: 161.0 ml LV vol s, MOD A2C: 131.0 ml LV vol s, MOD A4C: 98.7 ml LV SV MOD A2C:     48.0 ml LV SV MOD A4C:     161.0 ml LV SV MOD BP:      57.1 ml RIGHT VENTRICLE            IVC RV Basal diam:  3.90 cm    IVC diam: 1.80 cm RV Mid diam:    2.60 cm RV S prime:     6.53 cm/s TAPSE (M-mode): 1.1 cm LEFT ATRIUM             Index       RIGHT ATRIUM           Index LA diam:        3.40 cm 1.56 cm/m  RA Area:     20.70 cm LA Vol (A2C):   84.0 ml 38.42 ml/m RA Volume:   69.70 ml  31.88 ml/m LA Vol (A4C):   55.5 ml 25.38 ml/m LA Biplane Vol: 69.3 ml 31.70 ml/m  AORTIC VALVE AV Area (Vmax):    2.57 cm AV Area (Vmean):   2.22 cm AV Area (VTI):     2.21 cm AV Vmax:           79.56 cm/s AV Vmean:          57.620 cm/s AV VTI:            0.112 m AV Peak Grad:      2.5 mmHg AV Mean Grad:      1.6 mmHg LVOT Vmax:         65.14 cm/s LVOT Vmean:        40.740 cm/s LVOT VTI:          0.079 m LVOT/AV VTI ratio: 0.70  AORTA  Ao Root diam: 3.90 cm Ao Asc diam:  3.50 cm  SHUNTS Systemic VTI:  0.08 m Systemic Diam: 2.00 cm Loralie Champagne MD Electronically signed by Loralie Champagne MD Signature Date/Time: 10/30/2020/4:28:16 PM    Final     Cardiac Studies   Echocardiogram 10/30/2020: Impression: 1. Left ventricular ejection fraction, by estimation, is 20 to 25%. The  left ventricle has severely decreased function. The left ventricle  demonstrates global hypokinesis. There is mild left ventricular  hypertrophy. Left ventricular diastolic parameters  are indeterminate.  2. Right ventricular systolic function is moderately reduced. The right  ventricular size is normal. Tricuspid regurgitation signal is inadequate  for assessing PA pressure.  3. Left atrial size was mildly dilated.  4. Right atrial size was mildly dilated.  5. The mitral valve is normal in structure. Trivial mitral valve  regurgitation. No evidence of mitral stenosis.  6. The aortic valve is tricuspid. Aortic valve regurgitation is not  visualized. No aortic stenosis is present.  7. Aortic dilatation noted. There is mild dilatation of the aortic root,  measuring 37 mm.  8. The inferior vena cava is normal in size with greater than 50%  respiratory variability, suggesting right atrial pressure of 3 mmHg.  9. The patient was in atrial fibrillation.  Patient Profile     61 y.o. male with a history of recently diagnosed atrial fibrillation on Eliquis, hypertension, hyperlipidemia, tobacco use, and alcohol use. Patient referred Dr. Gardiner Rhyme on 10/27/2020 for evaluation of atrial fibrillation and found to be in atrial fibrillation with ventricular rates in the 160's. He was started on Carvedilol and Eliquis. He declined hospitalization at that time so plan was for outpatient TEE/DCCV. However, he then developed shortness of breath and was seen back in clinic by Dr. Gardiner Rhyme on 10/30/2020. He reported lightheadedness, worsening edema, and 3lb weight  gain since last visit. Therefore, he was directed admitted from the office for further management of atrial fibrillation with RVR and volume overload.   Assessment & Plan    Recently Diagnosed Atrial Fibrillation with RVR - Rates in the 90's to low 100's. - Potassium 3.1 today. Will replete. - Magnesium 1.7 yesterday. Will replete.  - TSH normal earlier this month. - Echo showed LVEF of 20-25% with global hypokinesis. - Will increase Toprol-XL to 75mg  daily. - CHA2DS2-VASc = 2 (CHF, HTN). Continue Eliquis 5mg  twice daily. - Current plan is for TEE/DCCV tomorrow. However, EF found to be severely reduced. Very possibly tachy-mediated; however, will discuss with MD about whether patient warrants ischemic evaluation prior to this.   After careful review of history and examination, the risks and benefits of transesophageal echocardiogram have been explained including risks of esophageal damage, perforation (1:10,000 risk), bleeding, pharyngeal hematoma as well as other potential complications associated with conscious sedation including aspiration, arrhythmia, respiratory failure and death. Alternatives to treatment were discussed, questions were answered. Patient is willing to proceed.   Acute Systolic CHF - Likely secondary to atrial fibrillation with RVR. - BNP 766 at office visit last seek. - Echo showed LVEF of 20-25% with global hypokinesis and mild LVH. RV normal with moderately reduced systolic function.  - Currently on IV Lasix 40mg  daily. No documented urinary output so far. Weight down 6lbs from yesterday. Creatinine slightly up from yesterday.  - Still looks mildly volume overloaded. - Continue IV Lasix today. May be able to switch to PO tomorrow. - Continue Toprol-XL as above.  - Patient on Lisinopril-HCTZ at home. This was stopped on admission. Will start Losartan 25mg  daily and then can transition to [facility] prior to discharge after ACEi washout. - Continue to monitor daily  weights, strict I/O's, and renal function.  - Cardiomyopathy felt to likely be secondary to atrial fibrillation with RVR and alcohol abuse. Will repeat Echo after restoration of sinus rhythm, 3 months of GDMT, and ideally alcohol cessation. If EF still down, will need ischemic evaluation.  Hypertension - BP mildly elevated (mostly diastolic BP). - Home Lisinopril-HCTZ stopped. - Toprol-XL increased as above.  - Will add Losartan 25mg  daily.  Hyperlipidemia - Lipid panel this admission: Total Cholesterol 153, Triglycerides 68, HDL 36, LDL 103. - On Pravastatin 40mg  daily at home. Will  switch to Lipitor 40mg  daily. - Will need repeat lipid panel and LFTs in 2 months.  Tobacco Abuse - Patient smokes 1.5 pack per day.  - Will need to continue to emphasize the importance of complete cessation.  - Will order Nicotine patch per patient requests.   Alcohol Abuse - Patient usually drinks a 6 pack of beer every other day but sometimes reports drinking more than this.  - Continue CIWA protocol during admission.  For questions or updates, please contact [facility] Please consult [email or web address] for contact info under        Signed, Darreld Mclean, PA-C  10/31/2020, 7:46 AM    Personally seen and examined. Agree with APP above with the following comments: Briefly 60 yo M with biventricular failure in the setting of alcohol abuse Patient notes that his alcohol history may be significant but has never had withdrawl Exam notable for dullness to percussion and LE edema Labs notable for stable creatinine with diuresis Personally reviewed relevant tests; no WMA on echo but global dysfunction Would recommend diuresis, DCCV 11/01/20 if euvolemic, and starting GDMT.  Losartan switch for future Entresto start.  Discussed alcohol free lifestyle.  Rudean Haskell, MD [address], [address] [place], [place] [address] [phone]  11:49 AM

## 2020-10-31 NOTE — Plan of Care (Signed)

## 2020-10-31 NOTE — Telephone Encounter (Signed)
PA submitted to [facility] for sleep study in lab.

## 2020-10-31 NOTE — TOC CAGE-AID Note (Signed)
Transition of Care [facility]) - CAGE-AID Screening   Patient Details  Name: Christopher Gutierrez MRN: [id number] Date of Birth: November 14, 1960  [facility] CM/SW Contact:    Trula Ore, [place] Phone Number: 10/31/2020, 1:12 PM   Clinical Narrative:   TOC CAGE-AID assessment completed. CSW offered patient outpatient substance use treatment services resources. Patient declined. CSW will continue to follow.   CAGE-AID Screening:    Have You Ever Felt You Ought to Cut Down on Your Drinking or Drug Use?: Yes Have People Annoyed You By Critizing Your Drinking Or Drug Use?: Yes Have You Felt Bad Or Guilty About Your Drinking Or Drug Use?: Yes Have You Ever Had a Drink or Used Drugs First Thing In The Morning to Steady Your Nerves or to Get Rid of a Hangover?: No CAGE-AID Score: 3  Substance Abuse Education Offered: Yes (patient declined)  Substance abuse interventions: Scientist, clinical (histocompatibility and immunogenetics) (resources offered patient declined)

## 2020-10-31 NOTE — H&P (View-Only) (Signed)
Progress Note  Patient Name: Christopher Gutierrez Date of Encounter: 10/31/2020  [facility] Cardiologist: [name], MD   Subjective   No acute overnight events. Patient feels like he is breathing a little better but he has not been up out of bed yet today. No chest pain. No awareness of atrial fibrillation - no palpitations or  lightheadedness/dizziness.  Inpatient Medications    Scheduled Meds: . apixaban  5 mg Oral BID  . folic acid  1 mg Oral Daily  . furosemide  40 mg Intravenous Daily  . gabapentin  300 mg Oral QHS  . metoprolol succinate  50 mg Oral Daily  . multivitamin with minerals  1 tablet Oral Daily  . pravastatin  40 mg Oral Daily  . thiamine  100 mg Oral Daily   Or  . thiamine  100 mg Intravenous Daily   Continuous Infusions:  PRN Meds: acetaminophen, LORazepam **OR** LORazepam, ondansetron (ZOFRAN) IV   Vital Signs    Vitals:   10/30/20 1753 10/30/20 1900 10/30/20 2055 10/31/20 0255  BP: (!) 138/125 (!) 125/94 (!) 118/103 (!) 137/110  Pulse: (!) 116 86 69 (!) 106  Resp: 17 16 18 20   Temp: 97.7 F (36.5 C) 98.7 F (37.1 C) 98.9 F (37.2 C) 98.9 F (37.2 C)  TempSrc: Oral Oral Oral Oral  SpO2: 98% 98% 95% 95%  Weight:    89.1 kg  Height:        Intake/Output Summary (Last 24 hours) at 10/31/2020 0746 Last data filed at 10/31/2020 0300 Gross per 24 hour  Intake 482.53 ml  Output --  Net 482.53 ml   Last 3 Weights 10/31/2020 10/30/2020 10/30/2020  Weight (lbs) 196 lb 6.4 oz 202 lb 12.8 oz 206 lb 9.6 oz  Weight (kg) 89.086 kg 91.989 kg 93.713 kg      Telemetry    Atrial fibrillation with rates in the 90's to low 100's. - Personally Reviewed  ECG    No new ECG tracing today. - Personally Reviewed  Physical Exam   GEN: No acute distress.   Neck: No JVD with patient sitting upright. Cardiac: Tachycardic with irregularly irregular rhythm. No murmurs, rubs, or gallops.  Respiratory: Clear to auscultation bilaterally. No  significant wheezes, rhonchi, or rales. GI: Soft, non-distended, and non-tender. Bowel sounds present. MS: 1+ pitting edema bilaterally. No deformity. Skin: Warm and dry. Neuro:  No focal deficits. Psych: Normal affect. Responds appropriately.  Labs    High Sensitivity Troponin:  No results for input(s): TROPONINIHS in the last 720 hours.    Chemistry Recent Labs  Lab 10/27/20 1252 10/30/20 1505 10/30/20 1821 10/31/20 0243  NA 141 138 136 138  K 3.5 3.2* 3.3* 3.1*  CL 97 99 98 101  CO2 24 32 27 28  GLUCOSE 102* 110* 87 103*  BUN 9 12 14 19   CREATININE 1.07 1.28* 1.15 1.33*  CALCIUM 9.1 9.3 9.2 9.5  PROT 6.5 6.0* 6.1*  --   ALBUMIN 4.3 3.5 3.6  --   AST 29 28 30   --   ALT 29 30 30   --   ALKPHOS 106 76 83  --   BILITOT 2.2* 3.6* 4.0*  --   GFRNONAA  --  >60 >60 >60  ANIONGAP  --  7 11 9      Hematology Recent Labs  Lab 10/27/20 1252 10/30/20 1505 10/30/20 1821  WBC 8.3 7.8 7.4  RBC 5.39 5.18 5.24  HGB 18.1* 17.3* 17.7*  HCT 52.2* 51.3 50.7  MCV 97 99.0 96.8  MCH 33.6* 33.4 33.8  MCHC 34.7 33.7 34.9  RDW 12.6 13.3 13.5  PLT 147* 156 159    BNP Recent Labs  Lab 10/27/20 1252  BNP 766.8*     DDimer No results for input(s): DDIMER in the last 168 hours.   Radiology    ECHOCARDIOGRAM COMPLETE  Result Date: 10/30/2020    ECHOCARDIOGRAM REPORT   Patient Name:   Christopher Gutierrez Date of Exam: 10/30/2020 Medical Rec #:  [id number]      Height:       74.0 in Accession #:    [id number]     Weight:       202.8 lb Date of Birth:  1960-10-10      BSA:          2.186 m Patient Age:    60 years       BP:           157/99 mmHg Patient Gender: M              HR:           126 bpm. Exam Location:  Inpatient Procedure: 2D Echo, Cardiac Doppler and Color Doppler Indications:    Atrial fibrillation  History:        Patient has no prior history of Echocardiogram examinations.                 Risk Factors:Current Smoker.  Sonographer:    Clayton Lefort RDCS (AE) Referring Phys: [phone]  HAO MENG IMPRESSIONS  1. Left ventricular ejection fraction, by estimation, is 20 to 25%. The left ventricle has severely decreased function. The left ventricle demonstrates global hypokinesis. There is mild left ventricular hypertrophy. Left ventricular diastolic parameters  are indeterminate.  2. Right ventricular systolic function is moderately reduced. The right ventricular size is normal. Tricuspid regurgitation signal is inadequate for assessing PA pressure.  3. Left atrial size was mildly dilated.  4. Right atrial size was mildly dilated.  5. The mitral valve is normal in structure. Trivial mitral valve regurgitation. No evidence of mitral stenosis.  6. The aortic valve is tricuspid. Aortic valve regurgitation is not visualized. No aortic stenosis is present.  7. Aortic dilatation noted. There is mild dilatation of the aortic root, measuring 37 mm.  8. The inferior vena cava is normal in size with greater than 50% respiratory variability, suggesting right atrial pressure of 3 mmHg.  9. The patient was in atrial fibrillation. FINDINGS  Left Ventricle: Left ventricular ejection fraction, by estimation, is 20 to 25%. The left ventricle has severely decreased function. The left ventricle demonstrates global hypokinesis. The left ventricular internal cavity size was normal in size. There is mild left ventricular hypertrophy. Left ventricular diastolic parameters are indeterminate. Right Ventricle: The right ventricular size is normal. No increase in right ventricular wall thickness. Right ventricular systolic function is moderately reduced. Tricuspid regurgitation signal is inadequate for assessing PA pressure. Left Atrium: Left atrial size was mildly dilated. Right Atrium: Right atrial size was mildly dilated. Pericardium: There is no evidence of pericardial effusion. Mitral Valve: The mitral valve is normal in structure. Trivial mitral valve regurgitation. No evidence of mitral valve stenosis. Tricuspid Valve:  The tricuspid valve is normal in structure. Tricuspid valve regurgitation is not demonstrated. Aortic Valve: The aortic valve is tricuspid. Aortic valve regurgitation is not visualized. No aortic stenosis is present. Aortic valve mean gradient measures 1.6 mmHg. Aortic valve peak gradient measures 2.5 mmHg. Aortic valve  area, by VTI measures 2.21 cm. Pulmonic Valve: The pulmonic valve was normal in structure. Pulmonic valve regurgitation is not visualized. Aorta: Aortic dilatation noted. There is mild dilatation of the aortic root, measuring 37 mm. Venous: The inferior vena cava is normal in size with greater than 50% respiratory variability, suggesting right atrial pressure of 3 mmHg. IAS/Shunts: No atrial level shunt detected by color flow Doppler.  LEFT VENTRICLE PLAX 2D LVIDd:         5.30 cm LVIDs:         4.80 cm LV PW:         1.40 cm LV IVS:        1.40 cm LVOT diam:     2.00 cm LV SV:         25 LV SV Index:   11 LVOT Area:     3.14 cm  LV Volumes (MOD) LV vol d, MOD A2C: 179.0 ml LV vol d, MOD A4C: 161.0 ml LV vol s, MOD A2C: 131.0 ml LV vol s, MOD A4C: 98.7 ml LV SV MOD A2C:     48.0 ml LV SV MOD A4C:     161.0 ml LV SV MOD BP:      57.1 ml RIGHT VENTRICLE            IVC RV Basal diam:  3.90 cm    IVC diam: 1.80 cm RV Mid diam:    2.60 cm RV S prime:     6.53 cm/s TAPSE (M-mode): 1.1 cm LEFT ATRIUM             Index       RIGHT ATRIUM           Index LA diam:        3.40 cm 1.56 cm/m  RA Area:     20.70 cm LA Vol (A2C):   84.0 ml 38.42 ml/m RA Volume:   69.70 ml  31.88 ml/m LA Vol (A4C):   55.5 ml 25.38 ml/m LA Biplane Vol: 69.3 ml 31.70 ml/m  AORTIC VALVE AV Area (Vmax):    2.57 cm AV Area (Vmean):   2.22 cm AV Area (VTI):     2.21 cm AV Vmax:           79.56 cm/s AV Vmean:          57.620 cm/s AV VTI:            0.112 m AV Peak Grad:      2.5 mmHg AV Mean Grad:      1.6 mmHg LVOT Vmax:         65.14 cm/s LVOT Vmean:        40.740 cm/s LVOT VTI:          0.079 m LVOT/AV VTI ratio: 0.70  AORTA  Ao Root diam: 3.90 cm Ao Asc diam:  3.50 cm  SHUNTS Systemic VTI:  0.08 m Systemic Diam: 2.00 cm Loralie Champagne MD Electronically signed by Loralie Champagne MD Signature Date/Time: 10/30/2020/4:28:16 PM    Final     Cardiac Studies   Echocardiogram 10/30/2020: Impression: 1. Left ventricular ejection fraction, by estimation, is 20 to 25%. The  left ventricle has severely decreased function. The left ventricle  demonstrates global hypokinesis. There is mild left ventricular  hypertrophy. Left ventricular diastolic parameters  are indeterminate.  2. Right ventricular systolic function is moderately reduced. The right  ventricular size is normal. Tricuspid regurgitation signal is inadequate  for assessing PA pressure.  3. Left atrial size was mildly dilated.  4. Right atrial size was mildly dilated.  5. The mitral valve is normal in structure. Trivial mitral valve  regurgitation. No evidence of mitral stenosis.  6. The aortic valve is tricuspid. Aortic valve regurgitation is not  visualized. No aortic stenosis is present.  7. Aortic dilatation noted. There is mild dilatation of the aortic root,  measuring 37 mm.  8. The inferior vena cava is normal in size with greater than 50%  respiratory variability, suggesting right atrial pressure of 3 mmHg.  9. The patient was in atrial fibrillation.  Patient Profile     60 y.o. male with a history of recently diagnosed atrial fibrillation on Eliquis, hypertension, hyperlipidemia, tobacco use, and alcohol use. Patient referred Dr. Gardiner Rhyme on 10/27/2020 for evaluation of atrial fibrillation and found to be in atrial fibrillation with ventricular rates in the 160's. He was started on Carvedilol and Eliquis. He declined hospitalization at that time so plan was for outpatient TEE/DCCV. However, he then developed shortness of breath and was seen back in clinic by Dr. Gardiner Rhyme on 10/30/2020. He reported lightheadedness, worsening edema, and 3lb weight  gain since last visit. Therefore, he was directed admitted from the office for further management of atrial fibrillation with RVR and volume overload.   Assessment & Plan    Recently Diagnosed Atrial Fibrillation with RVR - Rates in the 90's to low 100's. - Potassium 3.1 today. Will replete. - Magnesium 1.7 yesterday. Will replete.  - TSH normal earlier this month. - Echo showed LVEF of 20-25% with global hypokinesis. - Will increase Toprol-XL to 75mg  daily. - CHA2DS2-VASc = 2 (CHF, HTN). Continue Eliquis 5mg  twice daily. - Current plan is for TEE/DCCV tomorrow. However, EF found to be severely reduced. Very possibly tachy-mediated; however, will discuss with MD about whether patient warrants ischemic evaluation prior to this.   After careful review of history and examination, the risks and benefits of transesophageal echocardiogram have been explained including risks of esophageal damage, perforation (1:10,000 risk), bleeding, pharyngeal hematoma as well as other potential complications associated with conscious sedation including aspiration, arrhythmia, respiratory failure and death. Alternatives to treatment were discussed, questions were answered. Patient is willing to proceed.   Acute Systolic CHF - Likely secondary to atrial fibrillation with RVR. - BNP 766 at office visit last seek. - Echo showed LVEF of 20-25% with global hypokinesis and mild LVH. RV normal with moderately reduced systolic function.  - Currently on IV Lasix 40mg  daily. No documented urinary output so far. Weight down 6lbs from yesterday. Creatinine slightly up from yesterday.  - Still looks mildly volume overloaded. - Continue IV Lasix today. May be able to switch to PO tomorrow. - Continue Toprol-XL as above.  - Patient on Lisinopril-HCTZ at home. This was stopped on admission. Will start Losartan 25mg  daily and then can transition to [facility] prior to discharge after ACEi washout. - Continue to monitor daily  weights, strict I/O's, and renal function.  - Cardiomyopathy felt to likely be secondary to atrial fibrillation with RVR and alcohol abuse. Will repeat Echo after restoration of sinus rhythm, 3 months of GDMT, and ideally alcohol cessation. If EF still down, will need ischemic evaluation.  Hypertension - BP mildly elevated (mostly diastolic BP). - Home Lisinopril-HCTZ stopped. - Toprol-XL increased as above.  - Will add Losartan 25mg  daily.  Hyperlipidemia - Lipid panel this admission: Total Cholesterol 153, Triglycerides 68, HDL 36, LDL 103. - On Pravastatin 40mg  daily at home. Will  switch to Lipitor 40mg  daily. - Will need repeat lipid panel and LFTs in 2 months.  Tobacco Abuse - Patient smokes 1.5 pack per day.  - Will need to continue to emphasize the importance of complete cessation.  - Will order Nicotine patch per patient requests.   Alcohol Abuse - Patient usually drinks a 6 pack of beer every other day but sometimes reports drinking more than this.  - Continue CIWA protocol during admission.  For questions or updates, please contact [place] Please consult [email or web address] for contact info under        Signed, Darreld Mclean, PA-C  10/31/2020, 7:46 AM    Personally seen and examined. Agree with APP above with the following comments: Briefly 60 yo M with biventricular failure in the setting of alcohol abuse Patient notes that his alcohol history may be significant but has never had withdrawl Exam notable for dullness to percussion and LE edema Labs notable for stable creatinine with diuresis Personally reviewed relevant tests; no WMA on echo but global dysfunction Would recommend diuresis, DCCV 11/01/20 if euvolemic, and starting GDMT.  Losartan switch for future Entresto start.  Discussed alcohol free lifestyle.  Rudean Haskell, MD [address] [place], [place] [address] [phone]  11:49 AM

## 2020-11-01 ENCOUNTER — Inpatient Hospital Stay (HOSPITAL_COMMUNITY): Payer: 59 | Admitting: Anesthesiology

## 2020-11-01 ENCOUNTER — Encounter (HOSPITAL_COMMUNITY): Payer: Self-pay | Admitting: Cardiology

## 2020-11-01 ENCOUNTER — Encounter (HOSPITAL_COMMUNITY)
Admission: AD | Disposition: A | Discharge status: Home / Self Care | Payer: Self-pay | Source: Ambulatory Visit | Attending: Cardiology

## 2020-11-01 ENCOUNTER — Ambulatory Visit (HOSPITAL_COMMUNITY): Admission: RE | Admit: 2020-11-01 | Payer: 59 | Source: Home / Self Care | Admitting: Cardiology

## 2020-11-01 ENCOUNTER — Inpatient Hospital Stay (HOSPITAL_COMMUNITY): Payer: 59

## 2020-11-01 DIAGNOSIS — E785 Hyperlipidemia, unspecified: Secondary | ICD-10-CM | POA: Diagnosis not present

## 2020-11-01 DIAGNOSIS — I5021 Acute systolic (congestive) heart failure: Secondary | ICD-10-CM

## 2020-11-01 DIAGNOSIS — I4891 Unspecified atrial fibrillation: Secondary | ICD-10-CM | POA: Diagnosis not present

## 2020-11-01 DIAGNOSIS — I10 Essential (primary) hypertension: Secondary | ICD-10-CM | POA: Diagnosis not present

## 2020-11-01 DIAGNOSIS — I340 Nonrheumatic mitral (valve) insufficiency: Secondary | ICD-10-CM

## 2020-11-01 DIAGNOSIS — F1010 Alcohol abuse, uncomplicated: Secondary | ICD-10-CM

## 2020-11-01 HISTORY — PX: TEE WITHOUT CARDIOVERSION: SHX5443

## 2020-11-01 HISTORY — PX: CARDIOVERSION: SHX1299

## 2020-11-01 LAB — BASIC METABOLIC PANEL
Anion gap: 11 (ref 5–15)
BUN: 24 mg/dL — ABNORMAL HIGH (ref 6–20)
CO2: 23 mmol/L (ref 22–32)
Calcium: 9.3 mg/dL (ref 8.9–10.3)
Chloride: 102 mmol/L (ref 98–111)
Creatinine, Ser: 1.29 mg/dL — ABNORMAL HIGH (ref 0.61–1.24)
GFR, Estimated: >60 mL/min (ref >60)
Glucose, Bld: 193 mg/dL — ABNORMAL HIGH (ref 70–99)
Potassium: 4.5 mmol/L (ref 3.5–5.1)
Sodium: 136 mmol/L (ref 135–145)

## 2020-11-01 LAB — MAGNESIUM: Magnesium: 2.2 mg/dL (ref 1.7–2.4)

## 2020-11-01 LAB — BRAIN NATRIURETIC PEPTIDE: B Natriuretic Peptide: 3455 pg/mL — ABNORMAL HIGH (ref 0.0–100.0)

## 2020-11-01 SURGERY — ECHOCARDIOGRAM, TRANSESOPHAGEAL
Anesthesia: General

## 2020-11-01 MED ORDER — PRAVASTATIN SODIUM 40 MG PO TABS: 40.0000 mg | ORAL_TABLET | Freq: Every day | ORAL | Status: DC

## 2020-11-01 MED ORDER — BUTAMBEN-TETRACAINE-BENZOCAINE 2-2-14 % EX AERO
INHALATION_SPRAY | CUTANEOUS | Status: DC | PRN
  Administered 2020-11-01: 2 via TOPICAL

## 2020-11-01 MED ORDER — TRAMADOL HCL 50 MG PO TABS: 50.0000 mg | ORAL_TABLET | Freq: Once | ORAL | Status: DC | PRN

## 2020-11-01 MED ORDER — LOSARTAN POTASSIUM 50 MG PO TABS
50.0000 mg | ORAL_TABLET | Freq: Every day | ORAL | Status: DC
  Administered 2020-11-02: 50 mg via ORAL
  Filled 2020-11-01: qty 1

## 2020-11-01 MED ORDER — SPIRONOLACTONE 12.5 MG HALF TABLET
12.5000 mg | ORAL_TABLET | Freq: Every day | ORAL | Status: DC
  Administered 2020-11-01 – 2020-11-02 (×2): 12.5 mg via ORAL
  Filled 2020-11-01 (×2): qty 1

## 2020-11-01 MED ORDER — FUROSEMIDE 20 MG PO TABS
20.0000 mg | ORAL_TABLET | Freq: Every day | ORAL | Status: DC
  Administered 2020-11-02: 20 mg via ORAL
  Filled 2020-11-01: qty 1

## 2020-11-01 MED ORDER — PROPOFOL 500 MG/50ML IV EMUL
INTRAVENOUS | Status: DC | PRN
  Administered 2020-11-01: 125 ug/kg/min via INTRAVENOUS
  Administered 2020-11-01: 20 ug via INTRAVENOUS

## 2020-11-01 MED ORDER — FLUTICASONE PROPIONATE 50 MCG/ACT NA SUSP
1.0000 | Freq: Every day | NASAL | Status: DC | PRN
  Filled 2020-11-01: qty 16

## 2020-11-01 MED ORDER — BISACODYL 10 MG RE SUPP
10.0000 mg | Freq: Once | RECTAL | Status: AC
  Administered 2020-11-01: 10 mg via RECTAL
  Filled 2020-11-01: qty 1

## 2020-11-01 MED ORDER — LORAZEPAM 2 MG/ML IJ SOLN
2.0000 mg | Freq: Once | INTRAMUSCULAR | Status: AC
  Administered 2020-11-01: 2 mg via INTRAVENOUS
  Filled 2020-11-01: qty 1

## 2020-11-01 MED ORDER — ZOLPIDEM TARTRATE 5 MG PO TABS: 5.0000 mg | ORAL_TABLET | Freq: Every evening | ORAL | Status: DC | PRN

## 2020-11-01 NOTE — Progress Notes (Addendum)
   Patient had expressed interest in going home earlier today. We ideally hoped to keep the patient an extra night to optimize CHF medications but were OK with discharge if patient adamant about leaving. Repeat BNP was checked prior to discharge and was markedly elevated at 3,455. Went to talk with patient. He expressed multiple frustrations about this hospitalization including people frequently entering his room, multiple blood draws, and being told different things by different people. I apologized for any miscommunication and did my best to explain everything. I also explained that unfortunately it is difficult for [place] to limit who all comes into his room because multiple people have a job that they need to do. He also expressed frustration about being back on Metoprolol. He believes that beta-blockers cause left testicular swelling. I explained that we restarted this because we felt like it was more likely due to his acute CHF rather than the medication. Also explained the purpose of Metoprolol for management of his CHF and atrial fibrillation. However, he stated the swelling decreased when beta-blocker was stopped and is now as worse as it initially was since we restarted it. I told him that we could stop the Metoprolol and see how he does. He was agreeable to staying the night to allow [place] to monitor his heart rhythm and optimize his CHF medications. He did express frustration about not being able to sleep well partly due to diffuse back pain from the uncomfortable bed. He requested something to help his pain and to help him sleep. I discussed this with Dr. Gasper Sells who was okay with giving Tramadol and dose of Zolpidem tonight to help him sleep. Patient then told RN that he does not want Tramadol and that it is "a joke of a drug." Informed RN that we will not be giving him narcotics. I will put in an order for Tramadol just in case he decides he wants this.   Overview of Plan: - Patient agreeable to  stay overnight for observation. - Stop Metoprolol due to testicular swelling. - Start Spironolactone 12.5mg  daily. - Received last dose of IV Lasix this morning. Will transition to PO Lasix 20mg  daily tomorrow. - Will increase Losartan to 50mg  daily. - Ordered one time dose of Tramadol 50mg  for patient's back pain as well as Zolpidem 5mg  to help him sleep tonight. - Patient will be discharged tomorrow assuming he remains stable. He has close follow-up in the [facility] arranged.  Of note, we had transitioned patient to Lipitor 40mg  daily this admission due to LDL of 103. Patient has Pravastatin 40mg  daily listed under PTA medications but stated he had not started taking this yet. He has this prescription at home but has not started it yet. Given we are adding multiple new medications, will switch patient back to Pravastatin. We can recheck lipid panel in 2 months and if still elevated, can consider increasing to high-intensity statin at that time.  Darreld Mclean, PA-C 11/01/2020 4:21 PM

## 2020-11-01 NOTE — Progress Notes (Addendum)
Progress Note  Patient Name: Christopher Gutierrez Date of Encounter: 11/01/2020  [facility] Cardiologist: [name], MD   Subjective   No acute overnight events. Saw patient after he came back from TEE/DCCV. Maintaining normal sinus rhythm with rates in the 70's. He is more aggravated this morning and states he just wants something to help him sleep. However, he also states he thinks he is still "groggy" from the medications he got during his procedure. No shortness of breath at rest. No chest pain.   He is having a lot of diffuse muscle aches from the uncomfortable bed. Also reports he had abdominal pain and cramping after oral K-Dur. He has declined to take morning dose but has agreed to eat banana.  Inpatient Medications    Scheduled Meds: . [MAR Hold] apixaban  5 mg Oral BID  . [MAR Hold] atorvastatin  40 mg Oral Daily  . [MAR Hold] feeding supplement  237 mL Oral BID BM  . [MAR Hold] folic acid  1 mg Oral Daily  . [MAR Hold] furosemide  40 mg Intravenous Daily  . [MAR Hold] gabapentin  300 mg Oral QHS  . [MAR Hold] losartan  25 mg Oral Daily  . [MAR Hold] metoprolol succinate  75 mg Oral Daily  . [MAR Hold] multivitamin with minerals  1 tablet Oral Daily  . [MAR Hold] nicotine  21 mg Transdermal Daily  . [MAR Hold] potassium chloride  40 mEq Oral BID  . [MAR Hold] thiamine  100 mg Oral Daily   Or  . [facility] Hold] thiamine  100 mg Intravenous Daily   Continuous Infusions: . sodium chloride 20 mL/hr at 11/01/20 0810   PRN Meds: [MAR Hold] acetaminophen, [MAR Hold] LORazepam **OR** [MAR Hold] LORazepam, [MAR Hold] ondansetron (ZOFRAN) IV   Vital Signs    Vitals:   11/01/20 0730 11/01/20 0835 11/01/20 0845 11/01/20 0850  BP: (!) 152/110 126/72 110/81 117/86  Pulse: (!) 104 (!) 58 68 70  Resp: 19 15 (!) 35 (!) 23  Temp: 97.8 F (36.6 C) 97.6 F (36.4 C)    TempSrc: Oral Axillary    SpO2: 96% 90% 97% 98%  Weight:      Height:        Intake/Output Summary  (Last 24 hours) at 11/01/2020 0856 Last data filed at 10/31/2020 1800 Gross per 24 hour  Intake 102.72 ml  Output --  Net 102.72 ml   Last 3 Weights 10/31/2020 10/30/2020 10/30/2020  Weight (lbs) 196 lb 6.4 oz 202 lb 12.8 oz 206 lb 9.6 oz  Weight (kg) 89.086 kg 91.989 kg 93.713 kg      Telemetry    Maintaining sinus rhythm with rates in the 70's after DCCV. - Personally Reviewed  ECG    Normal sinus rhythm, rate 70 bpm, with PAC and T wave changes in lateral leads. - Personally Reviewed  Physical Exam   GEN: No acute distress.   Neck: No JVD. Cardiac: RRR. No murmurs, rubs, or gallops.  Respiratory: Clear to auscultation bilaterally. No wheezes, rhonchi, or rales. GI: Soft, non-distended, and non-tender.  MS: No lower extremity edema. No deformity. Skin: Warm and dry. Neuro:  No focal deficits. Psych: Normal affect. Responds appropriately.  Labs    High Sensitivity Troponin:  No results for input(s): TROPONINIHS in the last 720 hours.    Chemistry Recent Labs  Lab 10/27/20 1252 10/30/20 1505 10/30/20 1821 10/31/20 0243  NA 141 138 136 138  K 3.5 3.2* 3.3* 3.1*  CL 97 99 98 101  CO2 24 32 27 28  GLUCOSE 102* 110* 87 103*  BUN 9 12 14 19   CREATININE 1.07 1.28* 1.15 1.33*  CALCIUM 9.1 9.3 9.2 9.5  PROT 6.5 6.0* 6.1*  --   ALBUMIN 4.3 3.5 3.6  --   AST 29 28 30   --   ALT 29 30 30   --   ALKPHOS 106 76 83  --   BILITOT 2.2* 3.6* 4.0*  --   GFRNONAA  --  >60 >60 >60  ANIONGAP  --  7 11 9      Hematology Recent Labs  Lab 10/27/20 1252 10/30/20 1505 10/30/20 1821  WBC 8.3 7.8 7.4  RBC 5.39 5.18 5.24  HGB 18.1* 17.3* 17.7*  HCT 52.2* 51.3 50.7  MCV 97 99.0 96.8  MCH 33.6* 33.4 33.8  MCHC 34.7 33.7 34.9  RDW 12.6 13.3 13.5  PLT 147* 156 159    BNP Recent Labs  Lab 10/27/20 1252  BNP 766.8*     DDimer No results for input(s): DDIMER in the last 168 hours.   Radiology    ECHOCARDIOGRAM COMPLETE  Result Date: 10/30/2020    ECHOCARDIOGRAM REPORT    Patient Name:   [name] Date of Exam: 10/30/2020 Medical Rec #:  [id number]      Height:       74.0 in Accession #:    [id number]     Weight:       202.8 lb Date of Birth:  [birth year]      BSA:          2.186 m Patient Age:    65 years       BP:           157/99 mmHg Patient Gender: M              HR:           126 bpm. Exam Location:  Inpatient Procedure: 2D Echo, Cardiac Doppler and Color Doppler Indications:    Atrial fibrillation  History:        Patient has no prior history of Echocardiogram examinations.                 Risk Factors:Current Smoker.  Sonographer:    Clayton Lefort RDCS (AE) Referring Phys: [phone] HAO MENG IMPRESSIONS  1. Left ventricular ejection fraction, by estimation, is 20 to 25%. The left ventricle has severely decreased function. The left ventricle demonstrates global hypokinesis. There is mild left ventricular hypertrophy. Left ventricular diastolic parameters  are indeterminate.  2. Right ventricular systolic function is moderately reduced. The right ventricular size is normal. Tricuspid regurgitation signal is inadequate for assessing PA pressure.  3. Left atrial size was mildly dilated.  4. Right atrial size was mildly dilated.  5. The mitral valve is normal in structure. Trivial mitral valve regurgitation. No evidence of mitral stenosis.  6. The aortic valve is tricuspid. Aortic valve regurgitation is not visualized. No aortic stenosis is present.  7. Aortic dilatation noted. There is mild dilatation of the aortic root, measuring 37 mm.  8. The inferior vena cava is normal in size with greater than 50% respiratory variability, suggesting right atrial pressure of 3 mmHg.  9. The patient was in atrial fibrillation. FINDINGS  Left Ventricle: Left ventricular ejection fraction, by estimation, is 20 to 25%. The left ventricle has severely decreased function. The left ventricle demonstrates global hypokinesis. The left ventricular internal cavity size was normal in size. There  is mild  left ventricular hypertrophy. Left ventricular diastolic parameters are indeterminate. Right Ventricle: The right ventricular size is normal. No increase in right ventricular wall thickness. Right ventricular systolic function is moderately reduced. Tricuspid regurgitation signal is inadequate for assessing PA pressure. Left Atrium: Left atrial size was mildly dilated. Right Atrium: Right atrial size was mildly dilated. Pericardium: There is no evidence of pericardial effusion. Mitral Valve: The mitral valve is normal in structure. Trivial mitral valve regurgitation. No evidence of mitral valve stenosis. Tricuspid Valve: The tricuspid valve is normal in structure. Tricuspid valve regurgitation is not demonstrated. Aortic Valve: The aortic valve is tricuspid. Aortic valve regurgitation is not visualized. No aortic stenosis is present. Aortic valve mean gradient measures 1.6 mmHg. Aortic valve peak gradient measures 2.5 mmHg. Aortic valve area, by VTI measures 2.21 cm. Pulmonic Valve: The pulmonic valve was normal in structure. Pulmonic valve regurgitation is not visualized. Aorta: Aortic dilatation noted. There is mild dilatation of the aortic root, measuring 37 mm. Venous: The inferior vena cava is normal in size with greater than 50% respiratory variability, suggesting right atrial pressure of 3 mmHg. IAS/Shunts: No atrial level shunt detected by color flow Doppler.  LEFT VENTRICLE PLAX 2D LVIDd:         5.30 cm LVIDs:         4.80 cm LV PW:         1.40 cm LV IVS:        1.40 cm LVOT diam:     2.00 cm LV SV:         25 LV SV Index:   11 LVOT Area:     3.14 cm  LV Volumes (MOD) LV vol d, MOD A2C: 179.0 ml LV vol d, MOD A4C: 161.0 ml LV vol s, MOD A2C: 131.0 ml LV vol s, MOD A4C: 98.7 ml LV SV MOD A2C:     48.0 ml LV SV MOD A4C:     161.0 ml LV SV MOD BP:      57.1 ml RIGHT VENTRICLE            IVC RV Basal diam:  3.90 cm    IVC diam: 1.80 cm RV Mid diam:    2.60 cm RV S prime:     6.53 cm/s TAPSE (M-mode): 1.1 cm  LEFT ATRIUM             Index       RIGHT ATRIUM           Index LA diam:        3.40 cm 1.56 cm/m  RA Area:     20.70 cm LA Vol (A2C):   84.0 ml 38.42 ml/m RA Volume:   69.70 ml  31.88 ml/m LA Vol (A4C):   55.5 ml 25.38 ml/m LA Biplane Vol: 69.3 ml 31.70 ml/m  AORTIC VALVE AV Area (Vmax):    2.57 cm AV Area (Vmean):   2.22 cm AV Area (VTI):     2.21 cm AV Vmax:           79.56 cm/s AV Vmean:          57.620 cm/s AV VTI:            0.112 m AV Peak Grad:      2.5 mmHg AV Mean Grad:      1.6 mmHg LVOT Vmax:         65.14 cm/s LVOT Vmean:        40.740 cm/s  LVOT VTI:          0.079 m LVOT/AV VTI ratio: 0.70  AORTA Ao Root diam: 3.90 cm Ao Asc diam:  3.50 cm  SHUNTS Systemic VTI:  0.08 m Systemic Diam: 2.00 cm Loralie Champagne MD Electronically signed by Loralie Champagne MD Signature Date/Time: 10/30/2020/4:28:16 PM    Final     Cardiac Studies   TTE 10/30/2020: Impression: 1. Left ventricular ejection fraction, by estimation, is 20 to 25%. The  left ventricle has severely decreased function. The left ventricle  demonstrates global hypokinesis. There is mild left ventricular  hypertrophy. Left ventricular diastolic parameters  are indeterminate.  2. Right ventricular systolic function is moderately reduced. The right  ventricular size is normal. Tricuspid regurgitation signal is inadequate  for assessing PA pressure.  3. Left atrial size was mildly dilated.  4. Right atrial size was mildly dilated.  5. The mitral valve is normal in structure. Trivial mitral valve  regurgitation. No evidence of mitral stenosis.  6. The aortic valve is tricuspid. Aortic valve regurgitation is not  visualized. No aortic stenosis is present.  7. Aortic dilatation noted. There is mild dilatation of the aortic root,  measuring 37 mm.  8. The inferior vena cava is normal in size with greater than 50%  respiratory variability, suggesting right atrial pressure of 3 mmHg.  9. The patient was in atrial  fibrillation.  Patient Profile     60 y.o. male with a history of recently diagnosed atrial fibrillation on Eliquis, hypertension, hyperlipidemia, tobacco use, and alcohol use. Patient referred Dr. Gardiner Rhyme on 10/27/2020 for evaluation of atrial fibrillation and found to be in atrial fibrillation with ventricular rates in the 160's. He was started on Carvedilol and Eliquis. He declined hospitalization at that time so plan was for outpatient TEE/DCCV. However, he then developed shortness of breath and was seen back in clinic by Dr. Gardiner Rhyme on 10/30/2020. He reported lightheadedness, worsening edema, and 3lb weight gain since last visit. Therefore, he was directed admitted from the office for further management of atrial fibrillation with RVR and volume overload.   Assessment & Plan    Recently Diagnosed Atrial Fibrillation with RVR - S/p successful cardioversion this morning. Maintaining sinus rhythm with rates in the 70's. - Potassium 3.1 yesterday. Repeleted. Repeat BMET pending. - Magnesium 1.7 on admission. Repleted. Repeat lab pending. - TSH normal earlier this month. - Echo showed LVEF of 20-25% with global hypokinesis. - Continue Toprol-XL to 75mg  daily. - CHA2DS2-VASc = 2 (CHF, HTN). Continue Eliquis 5mg  twice daily.  Acute Systolic CHF - Likely secondary to atrial fibrillation with RVR. - BNP 766 at office visit last seek. - Echo showed LVEF of 20-25% with global hypokinesis and mild LVH. RV normal with moderately reduced systolic function.  - Currently on IV Lasix 40mg  daily. No documented urinary output this admission. No updated weight today. Creatinine was slightly up from yesterday. Today's labs pending. - Patient looks euvolemic. I think we can likely stop IV Lasix and transition to PO. - Continue Toprol-XL as above.  - Patient on Lisinopril-HCTZ at home. This was stopped on admission. Started on Losartan yesterday and will likely switch to [facility] today if renal function  stable (has been >36 hours since ACEi). - Continue to monitor daily weights, strict I/O's, and renal function.  - Cardiomyopathy felt to likely be secondary to atrial fibrillation with RVR and alcohol abuse. Will repeat Echo after restoration of sinus rhythm, 3 months of GDMT, and ideally alcohol cessation. If  EF still down, will need ischemic evaluation.  Hypertension - BP mildly elevated. - Home Lisinopril-HCTZ stopped.  - Started on Losartan yesterday and will switch to [facility] today if renal function stable as above. - Continue Toprol-XL as above.  Hyperlipidemia - Lipid panel this admission: Total Cholesterol 153, Triglycerides 68, HDL 36, LDL 103. - On Pravastatin 40mg  daily at home. Will switch to Lipitor 40mg  daily. - Will need repeat lipid panel and LFTs in 2 months.  Tobacco Abuse - Patient smokes 1.5 pack per day.  - Will need to continue to emphasize the importance of complete cessation.  - Nicotine patches ordered.  Alcohol Abuse - Patient usually drinks a 6 pack of beer every other day but sometimes reports drinking more than this.  - Continue CIWA protocol during admission.  For questions or updates, please contact [place] Please consult [email or web address] for contact info under        Signed, Darreld Mclean, PA-C  11/01/2020, 8:56 AM    Personally seen and examined. Agree with APP above with the following comments: Briefly 60 yo M with AF and HF who has had sleeping issues through the night; with desire for early DC Patient notes not sleeping well in the hospital and general pain Exam notable for regular rhythm post DCCV Labs notable for BNP 3000+  Personally reviewed relevant tests; Significantly reduced biventricular function Would recommend uptitration of GDMT.  Shared decision making with patient:  Will stay one more day for safe uptitration of medication if we will give him medication to sleep.  Patient will leave either way 11/02/20- we have  discussed the risks and benefits at length and will work for safe DC.

## 2020-11-01 NOTE — CV Procedure (Signed)
   Transesophageal Echocardiogram  Indications: AFIB with cardiomyopathy  Time out performed  Anesthesia - Propofol  Findings:  Left Ventricle: EF 10-15%, severely reduced  Mitral Valve: Normal, Trivial regurgitation  Aortic Valve: Normal  Tricuspid Valve: Normal, mild TR  Left Atrium: No LA appendage thrombus. Smoke in LA. Dilated   Candee Furbish, MD     Electrical Cardioversion Procedure Note Christopher Gutierrez [id number] 04-24-1961  Procedure: Electrical Cardioversion Indications:  Atrial Fibrillation  Time Out: Verified patient identification, verified procedure,medications/allergies/relevent history reviewed, required imaging and test results available.  Performed  Procedure Details  The patient was NPO after midnight. Anesthesia was administered at the beside  by Dr. Therisa Doyne with mg of propofol.  Cardioversion was performed with synchronized biphasic defibrillation via AP pads with 200 joules.  1 attempt(s) were performed.  The patient converted to normal sinus rhythm. The patient tolerated the procedure well   IMPRESSION:  Successful cardioversion of atrial fibrillation.    Candee Furbish 11/01/2020, 8:31 AM

## 2020-11-01 NOTE — Discharge Summary (Signed)
Discharge Summary    Patient ID: Christopher Gutierrez MRN: [id number]; DOB: 06/30/1961  Admit date: 10/30/2020 Discharge date: 11/02/2020  PCP:  [name], [facility]  Cardiologist:  [name], MD  Electrophysiologist:  None    Discharge Diagnoses    Principal Problem:   Atrial fibrillation with RVR [facility]) Active Problems:   Essential hypertension   Tobacco use disorder   Acute systolic CHF (congestive heart failure) ([place])   Hyperlipidemia   Alcohol abuse   Diagnostic Studies/Procedures    TTE 10/30/2020: Impression: 1. Left ventricular ejection fraction, by estimation, is 20 to 25%. The  left ventricle has severely decreased function. The left ventricle  demonstrates global hypokinesis. There is mild left ventricular  hypertrophy. Left ventricular diastolic parameters  are indeterminate.  2. Right ventricular systolic function is moderately reduced. The right  ventricular size is normal. Tricuspid regurgitation signal is inadequate  for assessing PA pressure.  3. Left atrial size was mildly dilated.  4. Right atrial size was mildly dilated.  5. The mitral valve is normal in structure. Trivial mitral valve  regurgitation. No evidence of mitral stenosis.  6. The aortic valve is tricuspid. Aortic valve regurgitation is not  visualized. No aortic stenosis is present.  7. Aortic dilatation noted. There is mild dilatation of the aortic root,  measuring 37 mm.  8. The inferior vena cava is normal in size with greater than 50%  respiratory variability, suggesting right atrial pressure of 3 mmHg.  9. The patient was in atrial fibrillation. _____________   TEE/DCCV 3/302022: Indications: AFIB with cardiomyopathy Time out performed Anesthesia - Propofol  Findings: Left Ventricle: EF 10-15%, severely reduced Mitral Valve: Normal, Trivial regurgitation Aortic Valve: Normal Tricuspid Valve: Normal, mild TR Left Atrium:  No LA appendage thrombus. Smoke in LA. Dilated  Procedure Details The patient was NPO after midnight. Anesthesia was administered at the beside  by Dr. Therisa Doyne with mg of propofol.  Cardioversion was performed with synchronized biphasic defibrillation via AP pads with 200 joules.  1 attempt(s) were performed.  The patient converted to normal sinus rhythm. The patient tolerated the procedure well   Impression: Successful cardioversion of atrial fibrillation.   History of Present Illness     Christopher Gutierrez is a 60 y.o. male with a history of recently diagnosed atrial fibrillation on Eliquis, hypertension, hyperlipidemia, tobacco use, and alcohol use. Patient referred Dr. Gardiner Rhyme on 10/27/2020 for evaluation of atrial fibrillation and found to be in atrial fibrillation with ventricular rates in the 160's. He was started on Carvedilol and Eliquis. He declined hospitalization at that time so plan was for outpatient TEE/DCCV. However, he then developed shortness of breath and was seen back in clinic by Dr. Gardiner Rhyme on 10/30/2020. He reported lightheadedness, worsening edema, and 3lb weight gain since last visit. Therefore, he was directly admitted from the office for further management of atrial fibrillation with RVR and volume overload.  Hospital Course     Consultants: None  Recently Diagnosed Atrial Fibrillation with RVR Potassium initially 3.2 and Magnesium 1.7. Both were repleted with improvement. TSH was normal earlier this month. Patient initially started on IV Diltiazem. However, TTE showed LVEF of 20-25% with global hypokinesis as well as moderately reduced RV systolic function. Therefore, Diltiazem was stopped and patient was started on Toprol-XL. He initially had concerns about beta-blocker causing scrotal edema but it was felt like this was likely due to acute CHF and not the medication. He underwent successful TEE/DCCV  on 11/01/2020 and remained in sinus rhythm the remainder of the  hospitalization. Following DCCV, he voiced concern that scrotal edema had returned since restarting beta-blocker Therefore, Toprol-XL was discontinued.    Chronic anticoagulation Continue Eliquis 5mg  twice daily.  He is part of the [employer] plan which prefers xarelto. Kerby Nora PharmD has submitted the prior authorization. I have submitted eliquis to TOC pharamcy for 30 day fill. Please check for PA approval at OV follow up.  This patients CHA2DS2-VASc Score and unadjusted Ischemic Stroke Rate (% per year) is equal to 2.2 % stroke rate/year from a score of 2 (CHF, HTN)  Will require 4 weeks of AC following DCCV.    Newly Diagnosed Acute Systolic CHF Likely secondary to atrial fibrillation with RVR. BNP 766 at office visit last seek. TTE showed LVEF of 20-25% with global hypokinesis and mild LVH. RV was normal with moderately reduced systolic function. EF was 10-15% on TEE. Patient was diuresed with IV Lasix and he was started on GDMT. Home Lisinopril-HCTZ was stopped and Losartan was started instead. He was initially started on beta-blocker but this was stopped because patient felt like this was causing scrotal edema. Also started on Spironolactone. Discharge weight 196 lbs (10/31/20). Will discharge patient on Lasix 20mg  daily, Losartan 50mg  daily, and Spironolactone 12.5mg  daily. We initially were planning on starting Entresto but decision was ultimately made to hold off and reconsider as outpatient given patient's alcohol abuse and concern for non-compliance. Discharge creatinine 1.40. Repeat BMP in 1 week.   Cardiomyopathy felt to likely be secondary to atrial fibrillation with RVR and alcohol abuse. Recommend repeat Echo after restoration of sinus rhythm, 3 months of GDMT, and ideally alcohol cessation. If EF still down, will need ischemic evaluation.  Patient has close follow-up in the [facility]. BMET can be rechecked at that time.   Hypertension BP mildly elevated  throughout admission (mostly diastolic BP). Patient's home Lisinopril-HCTZ was stopped and medications for CHF were added as above.   Hyperlipidemia Lipid panel this admission: Total Cholesterol 153, Triglycerides 68, HDL 36, LDL 103. Patient has Pravastatin 40mg  daily listed under PTA medications. He states he has this at home but has not started taking it yet. Can continue this at discharge. Recommend repeat lipid panel and LFTs in 2 months. If LDL still elevated, can consider switching to high-intensity statin.    Tobacco Abuse Patient smokes 1.5 pack per day. Patient was educated on the importance of complete cessation. Nicotine patches were ordered during admission.    Alcohol Abuse Patient reported usually drinking a6 pack of beer every other day (and sometimes more than this). CIWA protocol was used during admission. Patient was educated on the importance of complete alcohol cessation.    Patient seen and examined by Dr. Gasper Sells today and determined to be stable for discharge. Outpatient follow-up has been arranged. Medications as below.  Did the patient have an acute coronary syndrome (MI, NSTEMI, STEMI, etc) this admission?:  No                               Did the patient have a percutaneous coronary intervention (stent / angioplasty)?:  No.       _____________  Discharge Vitals Blood pressure (!) 137/97, pulse 73, temperature (!) 97.5 F (36.4 C), temperature source Oral, resp. rate 20, height 6\' 2"  (1.88 m), weight 89.1 kg, SpO2 96 %.  Filed Weights   10/30/20  1331 10/31/20 0255  Weight: 92 kg 89.1 kg    Labs & Radiologic Studies    CBC Recent Labs    10/30/20 1505 10/30/20 1821 11/02/20 0823  WBC 7.8 7.4 7.6  NEUTROABS 5.9  --   --   HGB 17.3* 17.7* 17.2*  HCT 51.3 50.7 52.0  MCV 99.0 96.8 101.6*  PLT 156 159 774   Basic Metabolic Panel Recent Labs    10/30/20 1821 10/31/20 0243 11/01/20 1057 11/02/20 0823  NA 136   < > 136 138  K 3.3*   <  > 4.5 4.0  CL 98   < > 102 104  CO2 27   < > 23 23  GLUCOSE 87   < > 193* 109*  BUN 14   < > 24* 29*  CREATININE 1.15   < > 1.29* 1.40*  CALCIUM 9.2   < > 9.3 9.2  MG 1.7  --  2.2  --   PHOS 3.4  --   --   --    < > = values in this interval not displayed.   Liver Function Tests Recent Labs    10/30/20 1505 10/30/20 1821  AST 28 30  ALT 30 30  ALKPHOS 76 83  BILITOT 3.6* 4.0*  PROT 6.0* 6.1*  ALBUMIN 3.5 3.6   No results for input(s): LIPASE, AMYLASE in the last 72 hours. High Sensitivity Troponin:   No results for input(s): TROPONINIHS in the last 720 hours.  BNP Invalid input(s): POCBNP D-Dimer No results for input(s): DDIMER in the last 72 hours. Hemoglobin A1C No results for input(s): HGBA1C in the last 72 hours. Fasting Lipid Panel Recent Labs    10/31/20 0243  CHOL 153  HDL 36*  LDLCALC 103*  TRIG 68  CHOLHDL 4.3   Thyroid Function Tests No results for input(s): TSH, T4TOTAL, T3FREE, THYROIDAB in the last 72 hours.  Invalid input(s): FREET3 _____________  ECHOCARDIOGRAM COMPLETE  Result Date: 10/30/2020    ECHOCARDIOGRAM REPORT   Patient Name:   NUH LIPTON Date of Exam: 10/30/2020 Medical Rec #:  [id number]      Height:       74.0 in Accession #:    [id number]     Weight:       202.8 lb Date of Birth:  November 21, 1960      BSA:          2.186 m Patient Age:    60 years       BP:           157/99 mmHg Patient Gender: M              HR:           126 bpm. Exam Location:  Inpatient Procedure: 2D Echo, Cardiac Doppler and Color Doppler Indications:    Atrial fibrillation  History:        Patient has no prior history of Echocardiogram examinations.                 Risk Factors:Current Smoker.  Sonographer:    Clayton Lefort RDCS (AE) Referring Phys: [phone] HAO MENG IMPRESSIONS  1. Left ventricular ejection fraction, by estimation, is 20 to 25%. The left ventricle has severely decreased function. The left ventricle demonstrates global hypokinesis. There is mild left  ventricular hypertrophy. Left ventricular diastolic parameters  are indeterminate.  2. Right ventricular systolic function is moderately reduced. The right ventricular size is normal. Tricuspid regurgitation signal is inadequate for assessing  PA pressure.  3. Left atrial size was mildly dilated.  4. Right atrial size was mildly dilated.  5. The mitral valve is normal in structure. Trivial mitral valve regurgitation. No evidence of mitral stenosis.  6. The aortic valve is tricuspid. Aortic valve regurgitation is not visualized. No aortic stenosis is present.  7. Aortic dilatation noted. There is mild dilatation of the aortic root, measuring 37 mm.  8. The inferior vena cava is normal in size with greater than 50% respiratory variability, suggesting right atrial pressure of 3 mmHg.  9. The patient was in atrial fibrillation. FINDINGS  Left Ventricle: Left ventricular ejection fraction, by estimation, is 20 to 25%. The left ventricle has severely decreased function. The left ventricle demonstrates global hypokinesis. The left ventricular internal cavity size was normal in size. There is mild left ventricular hypertrophy. Left ventricular diastolic parameters are indeterminate. Right Ventricle: The right ventricular size is normal. No increase in right ventricular wall thickness. Right ventricular systolic function is moderately reduced. Tricuspid regurgitation signal is inadequate for assessing PA pressure. Left Atrium: Left atrial size was mildly dilated. Right Atrium: Right atrial size was mildly dilated. Pericardium: There is no evidence of pericardial effusion. Mitral Valve: The mitral valve is normal in structure. Trivial mitral valve regurgitation. No evidence of mitral valve stenosis. Tricuspid Valve: The tricuspid valve is normal in structure. Tricuspid valve regurgitation is not demonstrated. Aortic Valve: The aortic valve is tricuspid. Aortic valve regurgitation is not visualized. No aortic stenosis is  present. Aortic valve mean gradient measures 1.6 mmHg. Aortic valve peak gradient measures 2.5 mmHg. Aortic valve area, by VTI measures 2.21 cm. Pulmonic Valve: The pulmonic valve was normal in structure. Pulmonic valve regurgitation is not visualized. Aorta: Aortic dilatation noted. There is mild dilatation of the aortic root, measuring 37 mm. Venous: The inferior vena cava is normal in size with greater than 50% respiratory variability, suggesting right atrial pressure of 3 mmHg. IAS/Shunts: No atrial level shunt detected by color flow Doppler.  LEFT VENTRICLE PLAX 2D LVIDd:         5.30 cm LVIDs:         4.80 cm LV PW:         1.40 cm LV IVS:        1.40 cm LVOT diam:     2.00 cm LV SV:         25 LV SV Index:   11 LVOT Area:     3.14 cm  LV Volumes (MOD) LV vol d, MOD A2C: 179.0 ml LV vol d, MOD A4C: 161.0 ml LV vol s, MOD A2C: 131.0 ml LV vol s, MOD A4C: 98.7 ml LV SV MOD A2C:     48.0 ml LV SV MOD A4C:     161.0 ml LV SV MOD BP:      57.1 ml RIGHT VENTRICLE            IVC RV Basal diam:  3.90 cm    IVC diam: 1.80 cm RV Mid diam:    2.60 cm RV S prime:     6.53 cm/s TAPSE (M-mode): 1.1 cm LEFT ATRIUM             Index       RIGHT ATRIUM           Index LA diam:        3.40 cm 1.56 cm/m  RA Area:     20.70 cm LA Vol (A2C):   84.0 ml 38.[place]  ml/m RA Volume:   69.70 ml  31.88 ml/m LA Vol (A4C):   55.5 ml 25.38 ml/m LA Biplane Vol: 69.3 ml 31.70 ml/m  AORTIC VALVE AV Area (Vmax):    2.57 cm AV Area (Vmean):   2.22 cm AV Area (VTI):     2.21 cm AV Vmax:           79.56 cm/s AV Vmean:          57.620 cm/s AV VTI:            0.112 m AV Peak Grad:      2.5 mmHg AV Mean Grad:      1.6 mmHg LVOT Vmax:         65.14 cm/s LVOT Vmean:        40.740 cm/s LVOT VTI:          0.079 m LVOT/AV VTI ratio: 0.70  AORTA Ao Root diam: 3.90 cm Ao Asc diam:  3.50 cm  SHUNTS Systemic VTI:  0.08 m Systemic Diam: 2.00 cm Loralie Champagne MD Electronically signed by Loralie Champagne MD Signature Date/Time: 10/30/2020/4:28:16 PM    Final     ECHO TEE  Result Date: 11/01/2020    TRANSESOPHOGEAL ECHO REPORT   Patient Name:   SID GREENER Date of Exam: 11/01/2020 Medical Rec #:  [id number]      Height:       74.0 in Accession #:    [id number]     Weight:       196.4 lb Date of Birth:  Aug 04, 1961      BSA:          2.157 m Patient Age:    70 years       BP:           152/110 mmHg Patient Gender: M              HR:           102 bpm. Exam Location:  Inpatient Procedure: Transesophageal Echo Indications:    I48.91* Unspeicified atrial fibrillation  History:        Patient has prior history of Echocardiogram examinations, most                 recent 10/30/2020. Arrythmias:Atrial Fibrillation and Atrial                 Flutter; Risk Factors:Hypertension.  Sonographer:    Bernadene Person RDCS Referring Phys: [id number] [place] PROCEDURE: After discussion of the risks and benefits of a TEE, an informed consent was obtained from the patient. The transesophogeal probe was passed without difficulty through the esophogus of the patient. Local oropharyngeal anesthetic was provided with Cetacaine. Sedation performed by different physician. The patient was monitored while under deep sedation. Anesthestetic sedation was provided intravenously by Anesthesiology: 158.17mg  of Propofol. The patient's vital signs; including heart rate, blood pressure, and oxygen saturation; remained stable throughout the procedure. The patient developed no complications during the procedure. A successful direct current cardioversion was performed at 200 joules with 1 attempt. IMPRESSIONS  1. Left ventricular ejection fraction, by estimation, is <20%. The left ventricle has severely decreased function. The left ventricle demonstrates global hypokinesis.  2. Right ventricular systolic function is normal. The right ventricular size is normal.  3. Left atrial size was severely dilated. No left atrial/left atrial appendage thrombus was detected.  4. The mitral valve is normal in  structure. Mild mitral valve regurgitation. No evidence of mitral stenosis.  5. The aortic valve is normal in structure. Aortic valve regurgitation is trivial. No aortic stenosis is present.  6. The inferior vena cava is normal in size with greater than 50% respiratory variability, suggesting right atrial pressure of 3 mmHg. Conclusion(s)/Recommendation(s): No LA/LAA thrombus identified. Successful cardioversion performed with restoration of normal sinus rhythm. FINDINGS  Left Ventricle: Left ventricular ejection fraction, by estimation, is <20%. The left ventricle has severely decreased function. The left ventricle demonstrates global hypokinesis. The left ventricular internal cavity size was normal in size. There is no  left ventricular hypertrophy. Right Ventricle: The right ventricular size is normal. No increase in right ventricular wall thickness. Right ventricular systolic function is normal. Left Atrium: Left atrial size was severely dilated. Spontaneous echo contrast was present. No left atrial/left atrial appendage thrombus was detected. Right Atrium: Right atrial size was normal in size. Pericardium: There is no evidence of pericardial effusion. Mitral Valve: The mitral valve is normal in structure. Mild mitral valve regurgitation. No evidence of mitral valve stenosis. Tricuspid Valve: The tricuspid valve is normal in structure. Tricuspid valve regurgitation is mild . No evidence of tricuspid stenosis. Aortic Valve: The aortic valve is normal in structure. Aortic valve regurgitation is trivial. No aortic stenosis is present. Pulmonic Valve: The pulmonic valve was normal in structure. Pulmonic valve regurgitation is trivial. No evidence of pulmonic stenosis. Aorta: The aortic root is normal in size and structure. Venous: The inferior vena cava is normal in size with greater than 50% respiratory variability, suggesting right atrial pressure of 3 mmHg. IAS/Shunts: No atrial level shunt detected by color flow  Doppler. Candee Furbish MD Electronically signed by Candee Furbish MD Signature Date/Time: 11/01/2020/2:52:23 PM    Final    US SCROTUM W/DOPPLER  Result Date: 10/17/2020 CLINICAL DATA:  Left testicle pain and swelling x3 days EXAM: SCROTAL ULTRASOUND DOPPLER ULTRASOUND OF THE TESTICLES TECHNIQUE: Complete ultrasound examination of the testicles, epididymis, and other scrotal structures was performed. Color and spectral Doppler ultrasound were also utilized to evaluate blood flow to the testicles. COMPARISON:  None. FINDINGS: Right testicle Measurements: 4.1 x 2.8 x 2.6 cm. No mass or microlithiasis visualized. Left testicle Measurements: 3.6 x 2.6 x 2.6 cm. No mass or microlithiasis visualized. Right epididymis:  Normal in size and appearance. Left epididymis:  Normal in size and appearance. Hydrocele:  Bilateral hydroceles are present. Varicocele:  None visualized. Pulsed Doppler interrogation of both testes demonstrates normal low resistance arterial and venous waveforms bilaterally. IMPRESSION: No evidence of testicular torsion.  Bilateral hydroceles. Electronically Signed   By: Dahlia Bailiff MD   On: 10/17/2020 12:19   Disposition   Patient is being discharged home today in good condition.  Follow-up Plans & Appointments     Follow-up Information    Darreld Mclean, PA-C Follow up.   Specialties: Physician Assistant, Cardiology Why: Hospital follow-up with Cardiology scheduled for 11/21/2020 at 11:15am. Please arrive 15 minute early for check-in. If this date/time does not work for you, please call our office to reschedule. Contact information: [address] [place] [address] [place] [address] [phone]        [facility]. Go in 1 week(s).   Specialty: Cardiology Why: Please come by our office in 1 week for a lab visit so that we can recheck your kidney function and electrolytes. You can come by anytime from 8:00am to 4:30pm (lab is closed from around 11:45am to 1:45pm for  lunch).  Contact information: [address]  [place]. Go on 11/08/2020.   Specialty: Cardiology Why: AT 1pm. [facility] ([facility]) within the [facility] parking at [employer], off [address] all medications/pill box with you. You will see a HF physician, pharmacist, social worker, nurse- about 1 hour appt. Contact information: [address] [id number] [place] [phone]               Discharge Medications   Allergies as of 11/02/2020      Reactions   Beta Adrenergic Blockers Other (See Comments)   Scrotal edema with metoprolol and carvedilol   Tape Rash      Medication List    STOP taking these medications   lisinopril-hydrochlorothiazide 20-25 MG tablet Commonly known as: ZESTORETIC   methocarbamol 500 MG tablet Commonly known as: ROBAXIN     TAKE these medications   apixaban 5 MG Tabs tablet Commonly known as: Eliquis Take 1 tablet (5 mg total) by mouth 2 (two) times daily.   furosemide 20 MG tablet Commonly known as: LASIX Take 1 tablet (20 mg total) by mouth daily.   gabapentin 300 MG capsule Commonly known as: NEURONTIN Start 300mg  qhs x1 week and then may increase to TID. What changed:   how much to take  how to take this  when to take this  additional instructions   losartan 50 MG tablet Commonly known as: COZAAR Take 1 tablet (50 mg total) by mouth daily.   nicotine 21 mg/24hr patch Commonly known as: NICODERM CQ - dosed in mg/24 hours Place 1 patch (21 mg total) onto the skin daily. Start taking on: November 03, 2020   pravastatin 40 MG tablet Commonly known as: PRAVACHOL Take 40 mg by mouth daily.   spironolactone 25 MG tablet Commonly known as: ALDACTONE Take 0.5 tablets (12.5 mg total) by mouth daily.          Outstanding Labs/Studies    Repeat BMET at follow-up visit.  Repeat lipid panel and LFTs in about 2 weeks after starting statin.  Duration of Discharge Encounter   Greater than 30 minutes including physician time.  Signed, Tami Lin Rubena Roseman, PA 11/02/2020, 11:48 AM

## 2020-11-01 NOTE — Progress Notes (Signed)
The patient stated that staff will not be permitted collect any more vital signs during the night while he is sleeping or come in the room. He also states that he will not consent to labwork collection until "later in the morning."

## 2020-11-01 NOTE — Transfer of Care (Signed)
Immediate Anesthesia Transfer of Care Note  Patient: Christopher Gutierrez  Procedure(s) Performed: TRANSESOPHAGEAL ECHOCARDIOGRAM (TEE) (N/A ) CARDIOVERSION (N/A )  Patient Location: Endoscopy Unit  Anesthesia Type:MAC  Level of Consciousness: awake, alert  and sedated  Airway & Oxygen Therapy: Patient connected to nasal cannula oxygen  Post-op Assessment: Post -op Vital signs reviewed and stable  Post vital signs: stable  Last Vitals:  Vitals Value Taken Time  BP    Temp    Pulse    Resp    SpO2      Last Pain:  Vitals:   11/01/20 0730  TempSrc: Oral  PainSc: 4       Patients Stated Pain Goal: 0 (07/28/48 7530)  Complications: No complications documented.

## 2020-11-01 NOTE — Interval H&P Note (Signed)
History and Physical Interval Note:  11/01/2020 8:08 AM  Christopher Gutierrez  has presented today for surgery, with the diagnosis of afib.  The various methods of treatment have been discussed with the patient and family. After consideration of risks, benefits and other options for treatment, the patient has consented to  Procedure(s): TRANSESOPHAGEAL ECHOCARDIOGRAM (TEE) (N/A) CARDIOVERSION (N/A) as a surgical intervention.  The patient's history has been reviewed, patient examined, no change in status, stable for surgery.  I have reviewed the patient's chart and labs.  Questions were answered to the patient's satisfaction.     [employer]

## 2020-11-01 NOTE — Progress Notes (Signed)
Heart Failure Stewardship Pharmacist Progress Note   PCP: [name], DO PCP-Cardiologist: [name], MD    HPI:  60 yo M with PMH of atrial fibrillation, HTN, HLD, tobacco use, and alcohol use. He presented to outpatient cardiology visit on 10/30/20 for follow up. He was then directly admitted to [facility] for IV diuresis prior to elective TEE/DCCV. Anticipated cardioversion for 11/01/20. An ECHO was done on 10/30/20 and LVEF was 20-25% with moderately reduced RV systolic function.  Current HF Medications: Furosemide 40 mg IV daily Metoprolol XL 75 mg daily Losartan 25 mg daily  Prior to admission HF Medications: Lisinopril 20 mg daily  Pertinent Lab Values: . Serum creatinine 1.29, BUN 24, Potassium 4.5, Sodium 136, BNP 766.8, Magnesium 2.2  Vital Signs: . Weight: 196 lbs (admission weight: 202 lbs) . Blood pressure: 130/100s  . Heart rate: 70s   Medication Assistance / Insurance Benefits Check: Does the patient have prescription insurance?  Yes Type of insurance plan: [facility] - Pharmacist, community  Outpatient Pharmacy:  [facility] Is the patient willing to use [facility] at discharge? Yes Is the patient willing to transition their outpatient pharmacy to utilize a [facility] outpatient pharmacy?   Pending    Assessment: 1. Acute on chronic systolic CHF (EF 30-16%), likely due to atrial fibrillation, no ischemic evaluation completed yet. NYHA class II symptoms. - Continue furosemide 40 mg IV daily - Continue metoprolol XL 75 mg daily - On losartan 25 mg daily - would optimize to Entresto 24/26 mg BID for further HF optimization with reduced EF - Consider staring spironolactone and/or SGLT2i prior to discharge vs at follow up pending discharge date   Plan: 1) Medication changes recommended at this time: - Stop losartan and start Entresto 24/26 mg BID   2) Patient assistance: - Farxiga copay $12.66 per month - can enroll in  copay card to lower copay to $0 per month - Entresto copay $517.58 (claims state $246 deductible remaining) - can enroll in copay card to lower copay to $10 per 30-90 day supply  3)  Education  - Patient has been educated on current HF medications and potential additions to HF medication regimen  - Patient verbalizes understanding that over the next few months, these medication doses may change and more medications may be added to optimize HF regimen - Patient has been educated on basic disease state pathophysiology and goals of therapy - Time spent (30 mins)  Kerby Nora, PharmD, BCPS Heart Failure Stewardship Pharmacist Phone [phone]

## 2020-11-01 NOTE — Anesthesia Postprocedure Evaluation (Signed)
Anesthesia Post Note  Patient: Christopher Gutierrez  Procedure(s) Performed: TRANSESOPHAGEAL ECHOCARDIOGRAM (TEE) (N/A ) CARDIOVERSION (N/A )     Patient location during evaluation: Endoscopy Anesthesia Type: General Level of consciousness: awake and alert Pain management: pain level controlled Vital Signs Assessment: post-procedure vital signs reviewed and stable Respiratory status: spontaneous breathing, nonlabored ventilation, respiratory function stable and patient connected to nasal cannula oxygen Cardiovascular status: blood pressure returned to baseline and stable Postop Assessment: no apparent nausea or vomiting Anesthetic complications: no   No complications documented.  Last Vitals:  Vitals:   11/01/20 0850 11/01/20 0900  BP: 117/86 118/82  Pulse: 70 70  Resp: (!) 23 (!) 25  Temp:    SpO2: 98% 96%    Last Pain:  Vitals:   11/01/20 0900  TempSrc:   PainSc: 0-No pain                 [name],W. EDMOND

## 2020-11-01 NOTE — Progress Notes (Signed)
  Echocardiogram Echocardiogram Transesophageal has been performed.  Christopher Gutierrez 11/01/2020, 8:58 AM

## 2020-11-01 NOTE — Progress Notes (Addendum)
Heart Failure Nurse Navigator Progress Note  PCP: [name], DO PCP-Cardiologist: Levada Dy., MD Admission Diagnosis: AF RVR, CHF Admitted from: home  Presentation:   Christopher Gutierrez presented from cardiology office as direct admit for IV diuresis prior to elective DCCV. 3/30 successful cardioversion to NSR. Pleasant man ambulating independently back from sink to bed at time of interview. Pt was interactive with interview, asking appropriate questions, receptive of information.  ECHO/ LVEF: 20-25%, RV mod reduced.  Clinical Course:  Past Medical History:  Diagnosis Date  . Atrial fibrillation and flutter ([place])   . 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 Hypertension     Social History   Socioeconomic History  . Marital status: Divorced    Spouse name: Not on file  . Number of children: 1  . Years of education: Not on file  . Highest education level: Master's degree (e.g., MA, MS, MEng, MEd, MSW, MBA)  Occupational History  . Not on file  Tobacco Use  . Smoking status: Current Every Day Smoker    Packs/day: 1.50    Years: 25.00    Pack years: 37.50    Types: Cigarettes  . Smokeless tobacco: Current User    Types: Chew  Vaping Use  . Vaping Use: Never used  Substance and Sexual Activity  . Alcohol use: Yes    Alcohol/week: 24.0 standard drinks    Types: 24 Cans of beer per week    Comment: 6 cans every other day  . Drug use: Yes    Frequency: 2.0 times per week    Types: Marijuana    Comment: he took Marijuana 2 weeks ago  . Sexual activity: Yes    Partners: Female  Other Topics Concern  . Not on file  Social History Narrative  . Not on file   Social Determinants of Health   Financial Resource Strain: Low Risk   . Difficulty of Paying Living Expenses: Not hard at all  Food Insecurity: No Food Insecurity  . Worried About Charity fundraiser in the Last Year: Never true  . Ran Out of Food in the Last Year: Never true   Transportation Needs: No Transportation Needs  . Lack of Transportation (Medical): No  . Lack of Transportation (Non-Medical): No  Physical Activity: Not on file  Stress: Not on file  Social Connections: Not on file   High Risk Criteria for Readmission and/or Poor Patient Outcomes:  Heart failure hospital admissions (last 6 months): 1   No Show rate: 0%  Difficult social situation: yes, minimal social support. Cares for 58yo father (father relatively independent)  Demonstrates medication adherence: yes  Primary Language: English  Literacy level: able to read/write and comprehend.  Education Assessment and Provision:  Detailed education and instructions provided on heart failure disease management including the following:  Signs and symptoms of Heart Failure When to call the physician Importance of daily weights Low sodium diet Fluid restriction Medication management Anticipated future follow-up appointments  Patient education given on each of the above topics.  Patient acknowledges understanding via teach back method and acceptance of all instructions.  Education Materials:  "Living Better With Heart Failure" Booklet, HF zone tool, & Daily Weight Tracker Tool.  Patient has scale at home: no, plans to purchase upon discharge. Patient has pill box at home: yes.   Barriers of Care:   -alcohol intake -smoking -mediation regimen/compliance -knowledge base  Considerations/Referrals:   Referral made to Heart Failure Pharmacist  Stewardship: yes, at bedside Referral made to Heart & Vascular TOC clinic: yes, April 6 @ 1pm. Confirmed pt has transportation.  Items for Follow-up on DC/TOC: -medication optimization -medication compliance -fluid intake/dietary changes -alcohol cessation -smoking/chew cessation -SW: potential disability (pt concerns about returning to work) -pt had question about getting assistance at home with caring for his father (home  health?)  Pricilla Holm, RN, BSN Heart Failure Nurse Navigator [phone]

## 2020-11-02 ENCOUNTER — Other Ambulatory Visit: Payer: Self-pay | Admitting: Physician Assistant

## 2020-11-02 ENCOUNTER — Encounter (HOSPITAL_COMMUNITY): Payer: Self-pay | Admitting: Cardiology

## 2020-11-02 DIAGNOSIS — I4891 Unspecified atrial fibrillation: Secondary | ICD-10-CM | POA: Diagnosis not present

## 2020-11-02 DIAGNOSIS — I5021 Acute systolic (congestive) heart failure: Secondary | ICD-10-CM

## 2020-11-02 LAB — BASIC METABOLIC PANEL
Anion gap: 11 (ref 5–15)
BUN: 29 mg/dL — ABNORMAL HIGH (ref 6–20)
CO2: 23 mmol/L (ref 22–32)
Calcium: 9.2 mg/dL (ref 8.9–10.3)
Chloride: 104 mmol/L (ref 98–111)
Creatinine, Ser: 1.4 mg/dL — ABNORMAL HIGH (ref 0.61–1.24)
GFR, Estimated: 59 mL/min — ABNORMAL LOW (ref >60)
Glucose, Bld: 109 mg/dL — ABNORMAL HIGH (ref 70–99)
Potassium: 4 mmol/L (ref 3.5–5.1)
Sodium: 138 mmol/L (ref 135–145)

## 2020-11-02 LAB — CBC
HCT: 52 % (ref 39.0–52.0)
Hemoglobin: 17.2 g/dL — ABNORMAL HIGH (ref 13.0–17.0)
MCH: 33.6 pg (ref 26.0–34.0)
MCHC: 33.1 g/dL (ref 30.0–36.0)
MCV: 101.6 fL — ABNORMAL HIGH (ref 80.0–100.0)
Platelets: 157 10*3/uL (ref 150–400)
RBC: 5.12 MIL/uL (ref 4.22–5.81)
RDW: 13.5 % (ref 11.5–15.5)
WBC: 7.6 10*3/uL (ref 4.0–10.5)
nRBC: 0 % (ref 0.0–0.2)

## 2020-11-02 MED ORDER — SPIRONOLACTONE 25 MG PO TABS: 12.5000 mg | ORAL_TABLET | Freq: Every day | ORAL | 2 refills | Status: DC

## 2020-11-02 MED ORDER — NICOTINE 21 MG/24HR TD PT24: 21.0000 mg | MEDICATED_PATCH | Freq: Every day | TRANSDERMAL | 0 refills | Status: DC

## 2020-11-02 MED ORDER — FUROSEMIDE 20 MG PO TABS: 20.0000 mg | ORAL_TABLET | Freq: Every day | ORAL | 2 refills | Status: DC

## 2020-11-02 MED ORDER — LOSARTAN POTASSIUM 50 MG PO TABS: 50.0000 mg | ORAL_TABLET | Freq: Every day | ORAL | 2 refills | Status: DC

## 2020-11-02 MED ORDER — APIXABAN 5 MG PO TABS: 5.0000 mg | ORAL_TABLET | Freq: Two times a day (BID) | ORAL | 3 refills | Status: DC

## 2020-11-02 MED FILL — NICOTINE 21 MG/24HR PATCH: 21 | 28 days supply | Qty: 28 | Fill #0

## 2020-11-02 MED FILL — LOSARTAN POTASSIUM 50 MG TA: 50 | 30 days supply | Qty: 30 | Fill #0

## 2020-11-02 MED FILL — FUROSEMIDE 20 MG TABLET: 20 | 30 days supply | Qty: 30 | Fill #0

## 2020-11-02 MED FILL — ELIQUIS 5 MG TABLET: 5 | 30 days supply | Qty: 60 | Fill #0

## 2020-11-02 MED FILL — SPIRONOLACTONE 25 MG TABLET: 25 | 30 days supply | Qty: 15 | Fill #0

## 2020-11-02 NOTE — Progress Notes (Incomplete)
Progress Note  Patient Name: Christopher Gutierrez Date of Encounter: 11/02/2020  [facility] Cardiologist: [name], MD ***  Subjective   ***  Inpatient Medications    Scheduled Meds: . apixaban  5 mg Oral BID  . feeding supplement  237 mL Oral BID BM  . folic acid  1 mg Oral Daily  . furosemide  20 mg Oral Daily  . gabapentin  300 mg Oral QHS  . losartan  50 mg Oral Daily  . multivitamin with minerals  1 tablet Oral Daily  . nicotine  21 mg Transdermal Daily  . pravastatin  40 mg Oral q1800  . spironolactone  12.5 mg Oral Daily  . thiamine  100 mg Oral Daily   Or  . thiamine  100 mg Intravenous Daily   Continuous Infusions:  PRN Meds: acetaminophen, fluticasone, LORazepam **OR** LORazepam, ondansetron (ZOFRAN) IV, traMADol, zolpidem   Vital Signs    Vitals:   11/01/20 0944 11/01/20 1121 11/01/20 1629 11/01/20 2100  BP: (!) 130/108 (!) 130/110 (!) 104/91 (!) 137/97  Pulse: 71 74 70 73  Resp:  20 20 20   Temp:   (!) 97.2 F (36.2 C) (!) 97.5 F (36.4 C)  TempSrc:   Oral Oral  SpO2:  98% 99% 96%  Weight:      Height:        Intake/Output Summary (Last 24 hours) at 11/02/2020 0944 Last data filed at 11/01/2020 1100 Gross per 24 hour  Intake 268.39 ml  Output -  Net 268.39 ml   Last 3 Weights 10/31/2020 10/30/2020 10/30/2020  Weight (lbs) 196 lb 6.4 oz 202 lb 12.8 oz 206 lb 9.6 oz  Weight (kg) 89.086 kg 91.989 kg 93.713 kg      Telemetry    *** - Personally Reviewed  ECG    *** - Personally Reviewed  Physical Exam  *** GEN: No acute distress.   Neck: No JVD Cardiac: RRR, no murmurs, rubs, or gallops.  Respiratory: Clear to auscultation bilaterally. GI: Soft, nontender, non-distended  MS: No edema; No deformity. Neuro:  Nonfocal  Psych: Normal affect   Labs    High Sensitivity Troponin:  No results for input(s): TROPONINIHS in the last 720 hours.    Chemistry Recent Labs  Lab 10/27/20 1252 10/27/20 1252 10/30/20 1505  10/30/20 1821 10/31/20 0243 11/01/20 1057 11/02/20 0823  NA 141   < > 138 136 138 136 138  K 3.5  --  3.2* 3.3* 3.1* 4.5 4.0  CL 97  --  99 98 101 102 104  CO2 24  --  32 27 28 23 23   GLUCOSE 102*   < > 110* 87 103* 193* 109*  BUN 9   < > 12 14 19  24* 29*  CREATININE 1.07  --  1.28* 1.15 1.33* 1.29* 1.40*  CALCIUM 9.1  --  9.3 9.2 9.5 9.3 9.2  PROT 6.5  --  6.0* 6.1*  --   --   --   ALBUMIN 4.3  --  3.5 3.6  --   --   --   AST 29  --  28 30  --   --   --   ALT 29  --  30 30  --   --   --   ALKPHOS 106  --  76 83  --   --   --   BILITOT 2.2*  --  3.6* 4.0*  --   --   --   GFRNONAA  --    < > >  60 >60 >60 >60 59*  ANIONGAP  --    < > 7 11 9 11 11    < > = values in this interval not displayed.     Hematology Recent Labs  Lab 10/30/20 1505 10/30/20 1821 11/02/20 0823  WBC 7.8 7.4 7.6  RBC 5.18 5.24 5.12  HGB 17.3* 17.7* 17.2*  HCT 51.3 50.7 52.0  MCV 99.0 96.8 101.6*  MCH 33.4 33.8 33.6  MCHC 33.7 34.9 33.1  RDW 13.3 13.5 13.5  PLT 156 159 157    BNP Recent Labs  Lab 10/27/20 1252 11/01/20 1309  BNP 766.8* 3,455.0*     DDimer No results for input(s): DDIMER in the last 168 hours.   Radiology    ECHO TEE  Result Date: 11/01/2020    TRANSESOPHOGEAL ECHO REPORT   Patient Name:   Christopher Gutierrez Date of Exam: 11/01/2020 Medical Rec #:  [id number]      Height:       74.0 in Accession #:    [id number]     Weight:       196.4 lb Date of Birth:  05-Jul-1961      BSA:          2.157 m Patient Age:    60 years       BP:           152/110 mmHg Patient Gender: M              HR:           102 bpm. Exam Location:  Inpatient Procedure: Transesophageal Echo Indications:    I48.91* Unspeicified atrial fibrillation  History:        Patient has prior history of Echocardiogram examinations, most                 recent 10/30/2020. Arrythmias:Atrial Fibrillation and Atrial                 Flutter; Risk Factors:Hypertension.  Sonographer:    Bernadene Person RDCS Referring Phys: [id number] [place] PROCEDURE: After discussion of the risks and benefits of a TEE, an informed consent was obtained from the patient. The transesophogeal probe was passed without difficulty through the esophogus of the patient. Local oropharyngeal anesthetic was provided with Cetacaine. Sedation performed by different physician. The patient was monitored while under deep sedation. Anesthestetic sedation was provided intravenously by Anesthesiology: 158.17mg  of Propofol. The patient's vital signs; including heart rate, blood pressure, and oxygen saturation; remained stable throughout the procedure. The patient developed no complications during the procedure. A successful direct current cardioversion was performed at 200 joules with 1 attempt. IMPRESSIONS  1. Left ventricular ejection fraction, by estimation, is <20%. The left ventricle has severely decreased function. The left ventricle demonstrates global hypokinesis.  2. Right ventricular systolic function is normal. The right ventricular size is normal.  3. Left atrial size was severely dilated. No left atrial/left atrial appendage thrombus was detected.  4. The mitral valve is normal in structure. Mild mitral valve regurgitation. No evidence of mitral stenosis.  5. The aortic valve is normal in structure. Aortic valve regurgitation is trivial. No aortic stenosis is present.  6. The inferior vena cava is normal in size with greater than 50% respiratory variability, suggesting right atrial pressure of 3 mmHg. Conclusion(s)/Recommendation(s): No LA/LAA thrombus identified. Successful cardioversion performed with restoration of normal sinus rhythm. FINDINGS  Left Ventricle: Left ventricular ejection fraction, by estimation, is <20%. The left ventricle has severely decreased  function. The left ventricle demonstrates global hypokinesis. The left ventricular internal cavity size was normal in size. There is no  left ventricular hypertrophy. Right Ventricle: The right ventricular  size is normal. No increase in right ventricular wall thickness. Right ventricular systolic function is normal. Left Atrium: Left atrial size was severely dilated. Spontaneous echo contrast was present. No left atrial/left atrial appendage thrombus was detected. Right Atrium: Right atrial size was normal in size. Pericardium: There is no evidence of pericardial effusion. Mitral Valve: The mitral valve is normal in structure. Mild mitral valve regurgitation. No evidence of mitral valve stenosis. Tricuspid Valve: The tricuspid valve is normal in structure. Tricuspid valve regurgitation is mild . No evidence of tricuspid stenosis. Aortic Valve: The aortic valve is normal in structure. Aortic valve regurgitation is trivial. No aortic stenosis is present. Pulmonic Valve: The pulmonic valve was normal in structure. Pulmonic valve regurgitation is trivial. No evidence of pulmonic stenosis. Aorta: The aortic root is normal in size and structure. Venous: The inferior vena cava is normal in size with greater than 50% respiratory variability, suggesting right atrial pressure of 3 mmHg. IAS/Shunts: No atrial level shunt detected by color flow Doppler. Candee Furbish MD Electronically signed by Candee Furbish MD Signature Date/Time: 11/01/2020/2:52:23 PM    Final     Cardiac Studies   Echo 10/30/20: 1. Left ventricular ejection fraction, by estimation, is 20 to 25%. The  left ventricle has severely decreased function. The left ventricle  demonstrates global hypokinesis. There is mild left ventricular  hypertrophy. Left ventricular diastolic parameters  are indeterminate.  2. Right ventricular systolic function is moderately reduced. The right  ventricular size is normal. Tricuspid regurgitation signal is inadequate  for assessing PA pressure.  3. Left atrial size was mildly dilated.  4. Right atrial size was mildly dilated.  5. The mitral valve is normal in structure. Trivial mitral valve  regurgitation. No  evidence of mitral stenosis.  6. The aortic valve is tricuspid. Aortic valve regurgitation is not  visualized. No aortic stenosis is present.  7. Aortic dilatation noted. There is mild dilatation of the aortic root,  measuring 37 mm.  8. The inferior vena cava is normal in size with greater than 50%  respiratory variability, suggesting right atrial pressure of 3 mmHg.  9. The patient was in atrial fibrillation.   Patient Profile     60 y.o. male   Assessment & Plan    ***  For questions or updates, please contact [facility] Please consult [email or web address] for contact info under        Signed, Ledora Bottcher, PA  11/02/2020, 9:44 AM

## 2020-11-02 NOTE — Plan of Care (Signed)
CDI Query Response: Persistent non long standing atrial fibrillation; s/p DCCV.

## 2020-11-02 NOTE — Progress Notes (Signed)
Patient Advocate Encounter   Received notification from [facility] that prior authorization for Eliquis is required.   PA submitted on CoverMyMeds Key [id number] Status is pending   Will continue to follow.  Kerby Nora, PharmD, BCPS Heart Failure Stewardship Pharmacist Phone [phone]  Please check [email or web address] for unit-specific pharmacist phone numbers

## 2020-11-02 NOTE — Progress Notes (Signed)
Notified by CCMD that patient's leads were off to his telemetry monitoring. Went in patient's room to check on him and he said, "I took it off and I'm not wearing it anymore." PA paged and made aware. Patient currently sitting on side of bed and is inquiring about his discharge orders. PA made aware.

## 2020-11-02 NOTE — Progress Notes (Signed)
Heart Failure Stewardship Pharmacist Progress Note   PCP: [name], DO PCP-Cardiologist: [name], MD    HPI:  60 yo M with PMH of atrial fibrillation, HTN, HLD, tobacco use, and alcohol use. He presented to outpatient cardiology visit on 10/30/20 for follow up. He was then directly admitted to [facility] for IV diuresis prior to elective TEE/DCCV. Anticipated cardioversion for 11/01/20. An ECHO was done on 10/30/20 and LVEF was 20-25% with moderately reduced RV systolic function.  Current HF Medications: Furosemide 20 mg daily Losartan 50 mg daily Spironolactone 12.5 mg daily  Prior to admission HF Medications: Lisinopril 20 mg daily  Pertinent Lab Values: . Serum creatinine 1.29, BUN 24, Potassium 4.5, Sodium 136, BNP 766.8, Magnesium 2.2  Vital Signs: . Weight: 196 lbs (admission weight: 202 lbs) . Blood pressure: 130/100s  . Heart rate: 70s   Medication Assistance / Insurance Benefits Check: Does the patient have prescription insurance?  Yes Type of insurance plan: [facility] - Pharmacist, community  Outpatient Pharmacy[facility]  [facility] Is the patient willing to use [facility] at discharge? Yes Is the patient willing to transition their outpatient pharmacy to utilize a [facility] outpatient pharmacy?   Pending    Assessment: 1. Acute on chronic systolic CHF (EF 66-44%), likely due to atrial fibrillation, no ischemic evaluation completed yet. NYHA class II symptoms. - Agree with transitioning to furosemide 20 mg daily - Off metoprolol and carvedilol because patient felt like this was causing scrotal edema - On losartan 50 mg daily - would optimize to Entresto 24/26 mg BID for further HF optimization with reduced EF - discussed with cardiology and they would like to wait until outpatient to determine compliance and lifestyle modification - Agree with staring spironolactone 12.5 mg daily - Consider adding SGLT2i at follow up appointment     Plan: 1) Medication changes recommended at this time: - Agree with adding spironolactone 12.5 mg daily    2) Patient assistance: - Farxiga copay $12.66 per month - can enroll in copay card to lower copay to $0 per month - Entresto copay $517.58 (claims state $246 deductible remaining) - can enroll in copay card to lower copay to $10 per 30-90 day supply  3)  Education  - Patient has been educated on current HF medications and potential additions to HF medication regimen  - Patient verbalizes understanding that over the next few months, these medication doses may change and more medications may be added to optimize HF regimen - Patient has been educated on basic disease state pathophysiology and goals of therapy - Time spent (30 mins)  Kerby Nora, PharmD, BCPS Heart Failure Stewardship Pharmacist Phone [phone]

## 2020-11-02 NOTE — Progress Notes (Signed)
Pt is alert and oriented. Discharge instructions/ AVS given to the pt.

## 2020-11-03 ENCOUNTER — Telehealth (HOSPITAL_COMMUNITY): Payer: Self-pay

## 2020-11-03 NOTE — Telephone Encounter (Signed)
Patient Advocate Encounter  Received notification from [facility] that prior authorization for Eliquis is required.  PA submitted on CoverMyMeds Key [id number] Status is denied; appeal initiated.   Will continue to follow.  Kerby Nora, PharmD, BCPS Heart Failure Stewardship Pharmacist Phone [phone]

## 2020-11-06 ENCOUNTER — Other Ambulatory Visit: Payer: Self-pay | Admitting: Cardiology

## 2020-11-06 ENCOUNTER — Telehealth: Payer: Self-pay | Admitting: *Deleted

## 2020-11-06 DIAGNOSIS — I4891 Unspecified atrial fibrillation: Secondary | ICD-10-CM

## 2020-11-06 DIAGNOSIS — I5021 Acute systolic (congestive) heart failure: Secondary | ICD-10-CM

## 2020-11-06 NOTE — Telephone Encounter (Signed)
Reviewed in lab sleep study PA request on [employer]'s web portal. Shows this has been denied. Ordering provider Dr Gardiner Rhyme will be notified. He can call and do a peer to peer or order HST, which does not require a PA.

## 2020-11-06 NOTE — Telephone Encounter (Signed)
HST is fine  

## 2020-11-07 ENCOUNTER — Other Ambulatory Visit (HOSPITAL_COMMUNITY): Payer: Self-pay

## 2020-11-08 ENCOUNTER — Other Ambulatory Visit (HOSPITAL_COMMUNITY): Payer: Self-pay

## 2020-11-08 ENCOUNTER — Other Ambulatory Visit: Payer: Self-pay

## 2020-11-08 ENCOUNTER — Encounter (HOSPITAL_COMMUNITY): Payer: Self-pay

## 2020-11-08 ENCOUNTER — Ambulatory Visit (HOSPITAL_COMMUNITY)
Admission: RE | Admit: 2020-11-08 | Discharge: 2020-11-08 | Disposition: A | Discharge status: Home / Self Care | Payer: 59 | Source: Ambulatory Visit | Attending: Internal Medicine | Admitting: Internal Medicine

## 2020-11-08 ENCOUNTER — Telehealth: Payer: Self-pay | Admitting: *Deleted

## 2020-11-08 ENCOUNTER — Telehealth (HOSPITAL_COMMUNITY): Payer: Self-pay

## 2020-11-08 VITALS — BP 150/100 | HR 93 | Wt 191.2 lb

## 2020-11-08 DIAGNOSIS — R5383 Other fatigue: Secondary | ICD-10-CM

## 2020-11-08 DIAGNOSIS — D751 Secondary polycythemia: Secondary | ICD-10-CM | POA: Insufficient documentation

## 2020-11-08 DIAGNOSIS — I10 Essential (primary) hypertension: Secondary | ICD-10-CM

## 2020-11-08 DIAGNOSIS — I110 Hypertensive heart disease with heart failure: Secondary | ICD-10-CM | POA: Insufficient documentation

## 2020-11-08 DIAGNOSIS — Z789 Other specified health status: Secondary | ICD-10-CM

## 2020-11-08 DIAGNOSIS — I5082 Biventricular heart failure: Secondary | ICD-10-CM | POA: Diagnosis not present

## 2020-11-08 DIAGNOSIS — I5022 Chronic systolic (congestive) heart failure: Secondary | ICD-10-CM | POA: Diagnosis present

## 2020-11-08 DIAGNOSIS — Z8249 Family history of ischemic heart disease and other diseases of the circulatory system: Secondary | ICD-10-CM | POA: Insufficient documentation

## 2020-11-08 DIAGNOSIS — F17210 Nicotine dependence, cigarettes, uncomplicated: Secondary | ICD-10-CM | POA: Insufficient documentation

## 2020-11-08 DIAGNOSIS — I4891 Unspecified atrial fibrillation: Secondary | ICD-10-CM

## 2020-11-08 DIAGNOSIS — Z79899 Other long term (current) drug therapy: Secondary | ICD-10-CM | POA: Insufficient documentation

## 2020-11-08 DIAGNOSIS — Z7901 Long term (current) use of anticoagulants: Secondary | ICD-10-CM | POA: Insufficient documentation

## 2020-11-08 DIAGNOSIS — Z720 Tobacco use: Secondary | ICD-10-CM

## 2020-11-08 DIAGNOSIS — F17290 Nicotine dependence, other tobacco product, uncomplicated: Secondary | ICD-10-CM | POA: Insufficient documentation

## 2020-11-08 DIAGNOSIS — M79606 Pain in leg, unspecified: Secondary | ICD-10-CM | POA: Insufficient documentation

## 2020-11-08 DIAGNOSIS — D696 Thrombocytopenia, unspecified: Secondary | ICD-10-CM

## 2020-11-08 DIAGNOSIS — Z7289 Other problems related to lifestyle: Secondary | ICD-10-CM

## 2020-11-08 DIAGNOSIS — M791 Myalgia, unspecified site: Secondary | ICD-10-CM | POA: Insufficient documentation

## 2020-11-08 LAB — BASIC METABOLIC PANEL
Anion gap: 8 (ref 5–15)
BUN: 15 mg/dL (ref 6–20)
CO2: 26 mmol/L (ref 22–32)
Calcium: 9.5 mg/dL (ref 8.9–10.3)
Chloride: 103 mmol/L (ref 98–111)
Creatinine, Ser: 1.01 mg/dL (ref 0.61–1.24)
GFR, Estimated: >60 mL/min (ref >60)
Glucose, Bld: 109 mg/dL — ABNORMAL HIGH (ref 70–99)
Potassium: 3.7 mmol/L (ref 3.5–5.1)
Sodium: 137 mmol/L (ref 135–145)

## 2020-11-08 MED ORDER — DIGOXIN 125 MCG PO TABS: 0.1250 mg | ORAL_TABLET | Freq: Every day | ORAL | 3 refills | Status: DC

## 2020-11-08 MED ORDER — DIGOXIN 125 MCG PO TABS
0.1250 mg | ORAL_TABLET | Freq: Every day | ORAL | 3 refills | Status: DC
  Filled 2020-11-08 (×2): qty 30, 30d supply, fill #0

## 2020-11-08 MED ORDER — ENTRESTO 24-26 MG PO TABS
1.0000 | ORAL_TABLET | Freq: Two times a day (BID) | ORAL | 11 refills | Status: DC
  Filled 2020-11-08 – 2020-12-05 (×3): qty 60, 30d supply, fill #0

## 2020-11-08 MED ORDER — FUROSEMIDE 20 MG PO TABS: 20.0000 mg | ORAL_TABLET | Freq: Every day | ORAL | 0 refills | Status: DC | PRN

## 2020-11-08 MED ORDER — ENTRESTO 24-26 MG PO TABS: 1.0000 | ORAL_TABLET | Freq: Two times a day (BID) | ORAL | 11 refills | Status: DC

## 2020-11-08 MED ORDER — BISOPROLOL FUMARATE 5 MG PO TABS
2.5000 mg | ORAL_TABLET | Freq: Every day | ORAL | 3 refills | Status: DC
  Filled 2020-11-08 (×2): qty 15, 30d supply, fill #0
  Filled 2020-11-29 – 2020-12-05 (×3): qty 15, 30d supply, fill #1

## 2020-11-08 NOTE — Progress Notes (Signed)
Patient Name: Christopher Gutierrez        DOB: 28-Jul-1961      Height:  6'2    Weight:191.2lbs  Office Name: [facility]         Referring Provider: Jenne Pane. Winfrey  Today's Date: 11/08/20  Date:  11/08/20 STOP BANG RISK ASSESSMENT S (snore) Have you been told that you snore?     NO   T (tired) Are you often tired, fatigued, or sleepy during the day?   YES  O (obstruction) Do you stop breathing, choke, or gasp during sleep? NO   P (pressure) Do you have or are you being treated for high blood pressure? YES   B (BMI) Is your body index greater than 35 kg/m? NO   A (age) Are you 22 years old or older? YES   N (neck) Do you have a neck circumference greater than 16 inches?   YES/NO   G (gender) Are you a male? YES   TOTAL STOP/BANG "YES" ANSWERS 4                                                                       For Office Use Only              Procedure Order Form    YES to 3+ Stop Bang questions OR two clinical symptoms - patient qualifies for WatchPAT (CPT 95800)             Clinical Notes: Will consult Sleep Specialist and refer for management of therapy due to patient increased risk of Sleep Apnea. Ordering a sleep study due to the following two clinical symptoms: Excessive daytime sleepiness G47.10 History of high blood pressure R03.0    I understand that I am proceeding with a home sleep apnea test as ordered by my treating physician. I understand that untreated sleep apnea is a serious cardiovascular risk factor and it is my responsibility to perform the test and seek management for sleep apnea. I will be contacted with the results and be managed for sleep apnea by a local sleep physician. I will be receiving equipment and further instructions from [facility]. I shall promptly ship back the equipment via the included mailing label. I understand my insurance will be billed for the test and as the patient I am responsible for any insurance related  out-of-pocket costs incurred. I have been provided with written instructions and can call for additional video or telephonic instruction, with 24-hour availability of qualified personnel to answer any questions: Patient Help Desk [phone].  Patient Signature ______________________________________________________   Date______________________ Patient Telemedicine Verbal Consent

## 2020-11-08 NOTE — Telephone Encounter (Signed)
Called to confirm Heart & Vascular Transitions of Care appointment at [facility]. Patient reminded to bring all medications and pill box organizer with them. Confirmed patient has transportation. Gave directions, instructed to utilize [place] parking.  Confirmed appointment prior to ending call.   Pricilla Holm, RN, BSN Heart Failure Nurse Navigator [phone]

## 2020-11-08 NOTE — Progress Notes (Addendum)
[facility]  PCP: [name] Primary Cardiologist: Oswaldo Milian  HPI:  Christopher Gutierrez is a 60 y.o.  male  with a PMH significant for Afib/Flutter, HTN, Tobacco use disorder, Alcohol use  Prior to 10/2020 not really seeing a physician.  Established with a new PCP Dr. [name] due to ongoing leg pain and shortness of breath.  ecg done in pcp office which showed afib with rvr and he was referred to cardiology Dr. Gardiner Rhyme and seen on 10/27/20 remained in afib w/rvr was offered admission but declined and was started on carvedilol and eliquis.  Symptoms worsened and he returned to clinic on 10/30/20 and was direct admitted for volume overload and afib w/ rvr.  Had an ECHO which demonstrated EF 20-25%, reduced RV function.  Started on IV diuretics with good response and received successful TEE/DCCV with restoration of NSR.  He declined bb before discharge (associates carvedilol and metoprolol with scrotal edema) but was started on losartan and spironolactone.    He is uncertain when he first noticed decreased exercise capacity because of his struggle with MSK pain limiting mobility and inability to tolerate pain killers.  Thinks it may have begun a few years ago.    Chest pain across the shoulder blades a few weeks ago before admission which was associated with palpitations and the feeling of his heart racing hasn't had any chest pain since.    Smoking 25-30 years quits on and off for a few months here and there a few times over the years.  Historically smoking 1ppd or more.  Now down to 2-3 cigs per day but has substituted for chewing tobacco and nicotine packets.  Still feeling weak gets short of breath with ambulation.  Went to the grocery store and had to stop several times due to dyspnea.   Has not been weighing himself doesn't have a scale.  Discharge weight 196lbs and now 191lbs here today.  Sleeping better, denies PND.      ROS: All systems  negative except as listed in HPI, PMH and Problem List.  SH:  Social History   Socioeconomic History  . Marital status: Divorced    Spouse name: Not on file  . Number of children: 1  . Years of education: Not on file  . Highest education level: Master's degree (e.g., MA, MS, MEng, MEd, MSW, MBA)  Occupational History  . Not on file  Tobacco Use  . Smoking status: Current Some Day Smoker    Packs/day: 1.50    Years: 25.00    Pack years: 37.50    Types: Cigarettes  . Smokeless tobacco: Current User    Types: Chew  Vaping Use  . Vaping Use: Never used  Substance and Sexual Activity  . Alcohol use: Yes    Alcohol/week: 24.0 standard drinks    Types: 24 Cans of beer per week    Comment: 6 cans every other day  . Drug use: Yes    Frequency: 2.0 times per week    Types: Marijuana    Comment: he took Marijuana 2 weeks ago  . Sexual activity: Yes    Partners: Female  Other Topics Concern  . Not on file  Social History Narrative  . Not on file   Social Determinants of Health   Financial Resource Strain: Low Risk   . Difficulty of Paying Living Expenses: Not hard at all  Food Insecurity: No Food Insecurity  . Worried About Charity fundraiser  in the Last Year: Never true  . Ran Out of Food in the Last Year: Never true  Transportation Needs: No Transportation Needs  . Lack of Transportation (Medical): No  . Lack of Transportation (Non-Medical): No  Physical Activity: Not on file  Stress: Not on file  Social Connections: Not on file  Intimate Partner Violence: Not on file    FH:  Family History  Problem Relation Age of Onset  . Gout Brother   . Hypertension Brother   . COPD Maternal Uncle   . Birth defects Paternal Aunt     Past Medical History:  Diagnosis Date  . Atrial fibrillation and flutter ([place])   . 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 Hypertension     Current Outpatient Medications  Medication Sig Dispense Refill  .  apixaban (ELIQUIS) 5 MG TABS tablet Take 1 tablet (5 mg total) by mouth 2 (two) times daily. 180 tablet 3  . bisoprolol (ZEBETA) 5 MG tablet Take 0.5 tablets (2.5 mg total) by mouth daily. 45 tablet 3  . gabapentin (NEURONTIN) 300 MG capsule Start 300mg  qhs x1 week and then may increase to TID. (Patient taking differently: Take 300 mg by mouth at bedtime.) 90 capsule 3  . pravastatin (PRAVACHOL) 40 MG tablet Take 40 mg by mouth daily.    Marland Kitchen spironolactone (ALDACTONE) 25 MG tablet Take 0.5 tablets (12.5 mg total) by mouth daily. 15 tablet 2  . digoxin (LANOXIN) 0.125 MG tablet Take 1 tablet (0.125 mg total) by mouth daily. 90 tablet 3  . furosemide (LASIX) 20 MG tablet Take 1 tablet (20 mg total) by mouth daily as needed for fluid or edema. 30 tablet 0  . sacubitril-valsartan (ENTRESTO) 24-26 MG Take 1 tablet by mouth 2 (two) times daily. 60 tablet 11   No current facility-administered medications for this encounter.    Vitals:   11/08/20 1259  BP: (!) 150/100  Pulse: 93  SpO2: 98%  Weight: 86.7 kg (191 lb 3.2 oz)    PHYSICAL EXAM: Cardiac: JVD flat, normal rate and rhythm, clear s1 and s2, no murmurs, rubs or gallops, no LE edema Pulmonary: CTAB, not in distress Abdominal: non distended abdomen, soft and nontender Psych: Alert, conversant, in good spirits   ECG   NSR today rate of 89, normal axis, Left atrial enlargement, t wave inversions in    ASSESSMENT & PLAN: Chronic Systolic CHF, Biventricular CHF:   -New diagnosis in the setting of afib w/rvr, alcohol use and heavy smoking history -10/2020 EF 20%, BIV failure -NYHA Class III symptoms, dry on examination -continue spiro 12.5 -switch losartan to entresto 24/26 -add digoxin -agreed to start on bisoprolol, start at 2.5 -switch lasix to PRN -ordered sleep study  Afib: -new diagnosis 10/2020 but may have been in afib for some time -s/p TEE/DCCV 11/01/20 -CHADS2VASC 2, continue eliquis -ordered sleep study -remains in NSR,  start bisoprolol 2.5  Tobacco Use Disorder: -has cut back significantly but still using chewing tobacco and nicotine packets doesn't tolerate patches.   -he will try the gum and lozenges in an attempt to stop tobacco products  Polycythemia: -could be secondary to smoking, sleep apnea or hypoxemia from CHF but will refer to hematology for further workup and management -won't add asa given he is on eliquis  Alcohol Use: -says he was never drinking that much 1-3 beers socially only and has stopped altogether now   Follow up with clinical pharmacist q2 weeks x2  then AHF

## 2020-11-08 NOTE — Progress Notes (Signed)
[facility] Heart Failure Pharmacist Encounter  HPI:  60 yo M with PMH of atrial fibrillation, HTN, HLD, tobacco use, and alcohol use. He presented to outpatient cardiology visit on 10/30/20 for follow up for atrial fibrillation. He was then directly admitted to [facility] for IV diuresis prior to elective TEE/DCCV. S/p successful cardioversion on 11/01/20. An ECHO was done on 10/30/20 and LVEF was 20-25% with moderately reduced RV systolic function. He was then discharged on 11/01/20.  Today, Christopher Gutierrez presents [facility] for follow up. He is still having some shortness of breath and DOE along with mild dizziness upon standing. He denies having orthopnea/PND or edema. He has been following a low-sodium and fluid-restricted diet at home. He has been taking all medications as prescribed and is using a pill box to stay organized.  HF Medications: Furosemide 20 mg daily Losartan 50 mg daily Spironolactone 12.5 mg daily  Has the patient been experiencing any side effects to the medications prescribed?  no  Does the patient have any problems obtaining medications due to transportation or finances?   no  Understanding of regimen: good Understanding of indications: good Potential of compliance: good Patient understands to avoid NSAIDs. Patient understands to avoid decongestants.   Pertinent Lab Values: . Serum creatinine 1.40, BUN 29, Potassium 4.0, Sodium 138, BNP 3455.0, Magnesium 2.2   Vital Signs: . Weight: 191 lbs (discharge weight: 196 lbs) . Blood pressure: 150/100 mmHg  . Heart rate: 93   Medication Assistance / Insurance Benefits Check: Does the patient have prescription insurance?  Yes Type of insurance plan: [facility] - Pharmacist, community  Outpatient Pharmacy:  [facility] Was the [facility] pharmacy used to supply discharge medications? yes  If TOC pharmacy was used, were the refills transferred out to  current pharmacy yet? no  Is the patient willing to transition their outpatient pharmacy to utilize a [facility] outpatient pharmacy with or without mail order?   Yes - will be using [facility] outpatient pharmacy  Assessment: 1) Chronic systolic CHF (EF 29-79%), due to presumed NICM - no ischemic evaluation completed yet. NYHA class II symptoms. - Change furosemide 20 mg to daily PRN - Start bisoprolol 2.5 mg daily - Stop losartan 50 mg daily - Start Entresto 24/26 mg BID - Continue spironolactone 12.5 mg daily - Start digoxin 0.125 mg daily  Plan: 1) Medication changes: - Change furosemide 20 mg to daily PRN - Start bisoprolol 2.5 mg daily - Stop losartan 50 mg daily - Start Entresto 24/26 mg BID - Start digoxin 0.125 mg daily  2) Patient Assistance: - Was successful in obtaining a copay card for Entresto.  This copay card will make the patients copay $10 per month. - The billing information is as follows and has been shared with the patient to bring to the outpatient pharmacy.   RxBin: [id number] PCN: [id number] Member ID: [id number] Group ID: [id number]  6) Follow up: - Next appointment with HF PharmD on 12/11/20  Kerby Nora, PharmD, BCPS Heart Failure Transitions of Care Clinic Pharmacist [phone]

## 2020-11-08 NOTE — Telephone Encounter (Signed)
Patient notified of [facility] appointment. [employer] denied in lab study. Patient has no co morbidities to warrant a in lab study.

## 2020-11-08 NOTE — Progress Notes (Signed)
Christopher Gutierrez was given a home sleep study and instructions to complete at home.  He will await phone call to proceed when we know if insurance prior authorization is necessary.  He acknowledges understanding.

## 2020-11-08 NOTE — Patient Instructions (Addendum)
EKG done today.  Labs done today. We will contact you only if your labs are abnormal.  STOP taking Losartan  START Digoxin 0.125mg  (1 tablet) by mouth daily.  START Entresto 24-26mg  (1 tablet) by mouth 2 times daily.  START Bisoprolol 2.5mg  (1/2 tablet) by mouth daily.  No other medication changes were made. Please continue all current medications as prescribed.  You have been referred to [facility]. They will contact you to schedule an appointment.  Your provider has recommended that you have a home sleep study.  We have provided you with the equipment in our office today. Please download the app and follow the instructions. YOUR PIN NUMBER IS: [id number]. Once you have completed the test you just dispose of the equipment, the information is automatically uploaded to [place] via blue-tooth technology. If your test is positive for sleep apnea and you need a home CPAP machine you will be contacted by Dr Theodosia Blender office [facility]) to set this up.  Your physician recommends that you schedule a follow-up appointment in: 2 weeks with our Clinic Pharmacist and in 6 weeks with Dr. [name] with an echo prior to your exam.  Your physician has requested that you have an echocardiogram. Echocardiography is a painless test that uses sound waves to create images of your heart. It provides your doctor with information about the size and shape of your heart and how well your heart's chambers and valves are working. This procedure takes approximately one hour. There are no restrictions for this procedure  If you have any questions or concerns before your next appointment please send [place] a message through Universal or call our office at [phone].    TO LEAVE A MESSAGE FOR THE NURSE SELECT OPTION 2, PLEASE LEAVE A MESSAGE INCLUDING: . YOUR NAME . DATE OF BIRTH . CALL BACK NUMBER . REASON FOR CALL**this is important as we prioritize the call backs  YOU WILL RECEIVE A CALL BACK THE SAME DAY AS  LONG AS YOU CALL BEFORE 4:00 PM   Do the following things EVERYDAY: 1) Weigh yourself in the morning before breakfast. Write it down and keep it in a log. 2) Take your medicines as prescribed 3) Eat low salt foods--Limit salt (sodium) to 2000 mg per day.  4) Stay as active as you can everyday 5) Limit all fluids for the day to less than 2 liters   At the [facility], you and your health needs are our priority. As part of our continuing mission to provide you with exceptional heart care, we have created designated Provider Care Teams. These Care Teams include your primary Cardiologist (physician) and Advanced Practice Providers (APPs- Physician Assistants and Nurse Practitioners) who all work together to provide you with the care you need, when you need it.   You may see any of the following providers on your designated Care Team at your next follow up: Marland Kitchen Dr Glori Bickers . Dr Loralie Champagne . Darrick Grinder, NP . Lyda Jester, PA . Audry Riles, PharmD   Please be sure to bring in all your medications bottles to every appointment.

## 2020-11-09 ENCOUNTER — Encounter (HOSPITAL_COMMUNITY): Payer: Self-pay

## 2020-11-10 ENCOUNTER — Telehealth: Payer: Self-pay | Admitting: Nurse Practitioner

## 2020-11-10 NOTE — Telephone Encounter (Signed)
Received a new hem referral from Dr. Shan Levans for polycythemia. Mr. Christopher Gutierrez has been cld and scheduled to see Christopher Gutierrez on 4/28 at 1:15pm w/labs at 1pm.

## 2020-11-13 ENCOUNTER — Other Ambulatory Visit (HOSPITAL_COMMUNITY): Payer: Self-pay

## 2020-11-16 ENCOUNTER — Telehealth (HOSPITAL_COMMUNITY): Payer: Self-pay | Admitting: Internal Medicine

## 2020-11-16 ENCOUNTER — Encounter (HOSPITAL_COMMUNITY): Payer: Self-pay

## 2020-11-16 NOTE — Telephone Encounter (Signed)
Pt called to f/u w/disability forms, that was dropped off last week, pt stated he need those forms by the end of the day, or he will get paid, pt can be reached [phone], please advise

## 2020-11-16 NOTE — Telephone Encounter (Signed)
Forms completed, signed and faxed, pt aware via mychart

## 2020-11-21 ENCOUNTER — Ambulatory Visit: Payer: 59 | Admitting: Student

## 2020-11-23 ENCOUNTER — Telehealth: Payer: Self-pay

## 2020-11-23 NOTE — Telephone Encounter (Signed)
Call to [facility] per IVR prior auth is not required for CPT 95800 (ref last 4 digits [phone]).  Called pt provided update regarding no preauth required, instructions reviewed pt plans to test over the weekend and verbalized understanding of instructions and has PIN.

## 2020-11-27 ENCOUNTER — Other Ambulatory Visit: Payer: Self-pay | Admitting: *Deleted

## 2020-11-27 DIAGNOSIS — D751 Secondary polycythemia: Secondary | ICD-10-CM

## 2020-11-27 DIAGNOSIS — D45 Polycythemia vera: Secondary | ICD-10-CM

## 2020-11-28 ENCOUNTER — Other Ambulatory Visit (HOSPITAL_COMMUNITY): Payer: Self-pay

## 2020-11-28 MED ORDER — SPIRONOLACTONE 25 MG PO TABS: 12.5000 mg | ORAL_TABLET | Freq: Every day | ORAL | 5 refills | Status: DC

## 2020-11-29 ENCOUNTER — Other Ambulatory Visit (HOSPITAL_COMMUNITY): Payer: Self-pay

## 2020-11-29 ENCOUNTER — Telehealth: Payer: Self-pay | Admitting: *Deleted

## 2020-11-29 ENCOUNTER — Encounter: Payer: Self-pay | Admitting: *Deleted

## 2020-11-29 NOTE — Telephone Encounter (Signed)
Called patient to remind him of new patient appointment tomorrow at 1 pm and to arrive at 12:15. Informed of need for mask and visitor policy. He reports he is divorced and lives with his father, Milbert Coulter. He has one adult son. 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 Reports a smoking history of 25-30 years smoking 1.5 ppd and now with current health situation trying to smoke only 1/2 ppd. Has started some smokeless tobacco now. Currently drinks ~ 5 alcoholic drinks/week (trying to cut back on this as well). Recently diagnosed with heart failure and A-fib with cardioversion on 10/24/20. Currently being followed by [facility] and Dr. [name] and Dr. Jeralyn Bennett. Denies any history of blood disorders or cancer in family.

## 2020-11-29 NOTE — Progress Notes (Signed)
Chart updated for new patient appointment on 11/30/20.

## 2020-11-30 ENCOUNTER — Inpatient Hospital Stay: Payer: 59 | Attending: Nurse Practitioner | Admitting: Nurse Practitioner

## 2020-11-30 ENCOUNTER — Other Ambulatory Visit: Payer: Self-pay

## 2020-11-30 ENCOUNTER — Inpatient Hospital Stay: Payer: 59

## 2020-11-30 VITALS — BP 143/99 | HR 67 | Temp 97.8°F | Resp 20 | Ht 74.0 in | Wt 196.6 lb

## 2020-11-30 DIAGNOSIS — I509 Heart failure, unspecified: Secondary | ICD-10-CM | POA: Diagnosis not present

## 2020-11-30 DIAGNOSIS — Z7901 Long term (current) use of anticoagulants: Secondary | ICD-10-CM | POA: Diagnosis not present

## 2020-11-30 DIAGNOSIS — F17210 Nicotine dependence, cigarettes, uncomplicated: Secondary | ICD-10-CM | POA: Diagnosis not present

## 2020-11-30 DIAGNOSIS — D751 Secondary polycythemia: Secondary | ICD-10-CM | POA: Insufficient documentation

## 2020-11-30 DIAGNOSIS — F1099 Alcohol use, unspecified with unspecified alcohol-induced disorder: Secondary | ICD-10-CM | POA: Diagnosis not present

## 2020-11-30 DIAGNOSIS — I4891 Unspecified atrial fibrillation: Secondary | ICD-10-CM

## 2020-11-30 DIAGNOSIS — I110 Hypertensive heart disease with heart failure: Secondary | ICD-10-CM | POA: Diagnosis not present

## 2020-11-30 DIAGNOSIS — D45 Polycythemia vera: Secondary | ICD-10-CM

## 2020-11-30 DIAGNOSIS — I10 Essential (primary) hypertension: Secondary | ICD-10-CM

## 2020-11-30 DIAGNOSIS — Z79899 Other long term (current) drug therapy: Secondary | ICD-10-CM | POA: Insufficient documentation

## 2020-11-30 LAB — CBC WITH DIFFERENTIAL (CANCER CENTER ONLY)
Abs Immature Granulocytes: 0.01 10*3/uL (ref 0.00–0.07)
Basophils Absolute: 0 10*3/uL (ref 0.0–0.1)
Basophils Relative: 1 %
Eosinophils Absolute: 0.1 10*3/uL (ref 0.0–0.5)
Eosinophils Relative: 2 %
HCT: 53.1 % — ABNORMAL HIGH (ref 39.0–52.0)
Hemoglobin: 17.9 g/dL — ABNORMAL HIGH (ref 13.0–17.0)
Immature Granulocytes: 0 %
Lymphocytes Relative: 18 %
Lymphs Abs: 1.1 10*3/uL (ref 0.7–4.0)
MCH: 32.4 pg (ref 26.0–34.0)
MCHC: 33.7 g/dL (ref 30.0–36.0)
MCV: 96 fL (ref 80.0–100.0)
Monocytes Absolute: 0.5 10*3/uL (ref 0.1–1.0)
Monocytes Relative: 8 %
Neutro Abs: 4.6 10*3/uL (ref 1.7–7.7)
Neutrophils Relative %: 71 %
Platelet Count: 125 10*3/uL — ABNORMAL LOW (ref 150–400)
RBC: 5.53 MIL/uL (ref 4.22–5.81)
RDW: 12.8 % (ref 11.5–15.5)
WBC Count: 6.4 10*3/uL (ref 4.0–10.5)
nRBC: 0 % (ref 0.0–0.2)

## 2020-11-30 LAB — RETICULOCYTES
Immature Retic Fract: 5 % (ref 2.3–15.9)
RBC.: 5.57 MIL/uL (ref 4.22–5.81)
Retic Count, Absolute: 38.4 10*3/uL (ref 19.0–186.0)
Retic Ct Pct: 0.7 % (ref 0.4–3.1)

## 2020-11-30 LAB — SAVE SMEAR(SSMR), FOR PROVIDER SLIDE REVIEW

## 2020-11-30 NOTE — Progress Notes (Addendum)
New Hematology/Oncology Consult   Requesting MD: Dr. Guadlupe Spanish  [phone]  Reason for Consult: Polycythemia  HPI: Christopher Gutierrez is a 60 year old man with past medical history significant for atrial fibrillation, hypertension, tobacco and alcohol use.  He was hospitalized 10/30/2020 to 11/02/2020 with atrial fibrillation.  He was found to have an EF of 20 to 25%.  Labs during that hospitalization showed elevated hemoglobin/hematocrit.  He has been referred for evaluation of polycythemia.     Past Medical History:  Diagnosis Date  . Atrial fibrillation ([place]) 10/24/2020  . Atrial fibrillation and flutter ([place])   . 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 Hypertension   :   Past Surgical History:  Procedure Laterality Date  . CARDIOVERSION N/A 11/01/2020   Procedure: CARDIOVERSION;  Surgeon: Jerline Pain, MD;  Location: [facility] ENDOSCOPY;  Service: Cardiovascular;  Laterality: N/A;  . CATARACT EXTRACTION Right   . MOUTH SURGERY  2013  . TEE WITHOUT CARDIOVERSION N/A 11/01/2020   Procedure: TRANSESOPHAGEAL ECHOCARDIOGRAM (TEE);  Surgeon: Jerline Pain, MD;  Location: [facility] ENDOSCOPY;  Service: Cardiovascular;  Laterality: N/A;  :   Current Outpatient Medications:  .  apixaban (ELIQUIS) 5 MG TABS tablet, Take 1 tablet (5 mg total) by mouth 2 (two) times daily., Disp: 180 tablet, Rfl: 3 .  bisoprolol (ZEBETA) 5 MG tablet, Take 0.5 tablets (2.5 mg total) by mouth daily., Disp: 45 tablet, Rfl: 3 .  digoxin (LANOXIN) 0.125 MG tablet, Take 1 tablet (0.125 mg total) by mouth daily., Disp: 90 tablet, Rfl: 3 .  furosemide (LASIX) 20 MG tablet, Take 1 tablet (20 mg total) by mouth daily as needed for fluid or edema. (Patient not taking: Reported on 11/29/2020), Disp: 30 tablet, Rfl: 0 .  pravastatin (PRAVACHOL) 40 MG tablet, Take 40 mg by mouth daily., Disp: , Rfl:  .  sacubitril-valsartan (ENTRESTO) 24-26 MG, Take 1 tablet by mouth 2 (two) times daily., Disp: 60 tablet,  Rfl: 11 .  spironolactone (ALDACTONE) 25 MG tablet, Take 0.5 tablets (12.5 mg total) by mouth daily., Disp: 15 tablet, Rfl: 5:  :   Allergies  Allergen Reactions  . Beta Adrenergic Blockers Other (See Comments)    Scrotal edema with metoprolol and carvedilol  . Tape Rash    Plastic tape  :  FH: Father is living, 43, "reasonably" good health; mother deceased of suicide; brother age 22 in good health.  No family history of a blood disorder.  SOCIAL HISTORY: He lives in [place] with his father.  He is divorced.  He has a 74 year old son who overall is healthy.  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  He is currently on short-term disability.  He has been smoking for 25+ years, current use 1/2 pack/day, in the past as much is 2 packs/day.  Current alcohol intake reported as 3-4 beers per week.  He reports heavier alcohol intake in the past, 7 beers on a daily basis.  He has never had a blood transfusion.  Review of Systems: No fevers or sweats.  He reports a good appetite.  He reports weight loss with Lasix.  He has never had a blood clot.  No unusual headaches or vision change.  He has occasional numbness/tingling in his feet.  No pruritis after bath/shower.  No rash.  Dyspnea is better.  He reports a "smoker's cough".  Also coughs intermittently due to allergies.  No chest pain.  No leg swelling.  No blood or  pain with bowel movements.  No urinary symptoms.   Physical Exam:  Blood pressure (!) 143/99, pulse 67, temperature 97.8 F (36.6 C), temperature source Oral, resp. rate 20, height 6\' 2"  (1.88 m), weight 196 lb 9.6 oz (89.2 kg), SpO2 100 %.  HEENT: PERRLA.  Sclera anicteric.  Oropharynx without thrush or ulceration. Lungs: Lungs clear bilaterally. Cardiac: Regular rate and rhythm. Abdomen: Soft and nontender.  No hepatosplenomegaly.  Vascular: No leg edema. Lymph nodes: No palpable cervical, supraclavicular, axillary or inguinal lymph nodes. Neurologic: Alert and  oriented.  Follows commands. Skin: No rash.  LABS: CBC 11/02/2020-hemoglobin 17.2, hematocrit 52, MCV 101.6, white blood cell 7.6, platelet count 157,000 CBC 11/30/2020-hemoglobin 17.9, hematocrit 53.1, white count 6.4, platelet count 125,000  Review of peripheral blood smear: RBCs-few ovalocytes, teardrops and target cells; platelets-normal in number, no platelet clumps; white blood cells-morphology unremarkable, no blasts or other young forms seen  RADIOLOGY:  ECHO TEE  Result Date: 11/01/2020    TRANSESOPHOGEAL ECHO REPORT   Patient Name:   Christopher Gutierrez Date of Exam: 11/01/2020 Medical Rec #:  [id number]      Height:       74.0 in Accession #:    [id number]     Weight:       196.4 lb Date of Birth:  July 22, 1961      BSA:          2.157 m Patient Age:    36 years       BP:           152/110 mmHg Patient Gender: M              HR:           102 bpm. Exam Location:  Inpatient Procedure: Transesophageal Echo Indications:    I48.91* Unspeicified atrial fibrillation  History:        Patient has prior history of Echocardiogram examinations, most                 recent 10/30/2020. Arrythmias:Atrial Fibrillation and Atrial                 Flutter; Risk Factors:Hypertension.  Sonographer:    Bernadene Person RDCS Referring Phys: [id number] [place] PROCEDURE: After discussion of the risks and benefits of a TEE, an informed consent was obtained from the patient. The transesophogeal probe was passed without difficulty through the esophogus of the patient. Local oropharyngeal anesthetic was provided with Cetacaine. Sedation performed by different physician. The patient was monitored while under deep sedation. Anesthestetic sedation was provided intravenously by Anesthesiology: 158.17mg  of Propofol. The patient's vital signs; including heart rate, blood pressure, and oxygen saturation; remained stable throughout the procedure. The patient developed no complications during the procedure. A successful direct current  cardioversion was performed at 200 joules with 1 attempt. IMPRESSIONS  1. Left ventricular ejection fraction, by estimation, is <20%. The left ventricle has severely decreased function. The left ventricle demonstrates global hypokinesis.  2. Right ventricular systolic function is normal. The right ventricular size is normal.  3. Left atrial size was severely dilated. No left atrial/left atrial appendage thrombus was detected.  4. The mitral valve is normal in structure. Mild mitral valve regurgitation. No evidence of mitral stenosis.  5. The aortic valve is normal in structure. Aortic valve regurgitation is trivial. No aortic stenosis is present.  6. The inferior vena cava is normal in size with greater than 50% respiratory variability, suggesting right atrial pressure of 3 mmHg.  Conclusion(s)/Recommendation(s): No LA/LAA thrombus identified. Successful cardioversion performed with restoration of normal sinus rhythm. FINDINGS  Left Ventricle: Left ventricular ejection fraction, by estimation, is <20%. The left ventricle has severely decreased function. The left ventricle demonstrates global hypokinesis. The left ventricular internal cavity size was normal in size. There is no  left ventricular hypertrophy. Right Ventricle: The right ventricular size is normal. No increase in right ventricular wall thickness. Right ventricular systolic function is normal. Left Atrium: Left atrial size was severely dilated. Spontaneous echo contrast was present. No left atrial/left atrial appendage thrombus was detected. Right Atrium: Right atrial size was normal in size. Pericardium: There is no evidence of pericardial effusion. Mitral Valve: The mitral valve is normal in structure. Mild mitral valve regurgitation. No evidence of mitral valve stenosis. Tricuspid Valve: The tricuspid valve is normal in structure. Tricuspid valve regurgitation is mild . No evidence of tricuspid stenosis. Aortic Valve: The aortic valve is normal in  structure. Aortic valve regurgitation is trivial. No aortic stenosis is present. Pulmonic Valve: The pulmonic valve was normal in structure. Pulmonic valve regurgitation is trivial. No evidence of pulmonic stenosis. Aorta: The aortic root is normal in size and structure. Venous: The inferior vena cava is normal in size with greater than 50% respiratory variability, suggesting right atrial pressure of 3 mmHg. IAS/Shunts: No atrial level shunt detected by color flow Doppler. Candee Furbish MD Electronically signed by Candee Furbish MD Signature Date/Time: 11/01/2020/2:52:23 PM    Final     Assessment and Plan:   1. Erythrocytosis, likely secondary 2. Hypertension 3. Atrial fibrillation 4. CHF 5. Alcohol and tobacco use  Christopher Gutierrez was recently found to have erythrocytosis when he was hospitalized with atrial fibrillation, heart failure.  The erythrocytosis is likely secondary due to heart disease, smoking.   We will follow-up on the erythropoietin level from today and also send testing for JAK2 mutation.  He will return for a CBC and follow-up visit in 6 months, sooner if indicated pending results of outstanding labs.  Patient seen with Dr. Benay Spice.    Ned Card, NP 11/30/2020, 1:40 PM   This was a shared visit with Ned Card.  Christopher Gutierrez was interviewed and examined.  I reviewed the peripheral blood smear.  He was found to have an elevated hemoglobin and hematocrit during a recent hospital admission with atrial fibrillation.  I think it is very likely he has a true erythrocytosis secondary to cardiopulmonary disease.  However there may now be a component of plasma contraction related to diuretic therapy.  We have a low clinical suspicion for a myeloproliferative disorder.  We will obtain an erythropoietin level and JAK2 mutation analysis to complete a work-up for erythrocytosis.  There is no indication for phlebotomy therapy at present.  Mild thrombocytopenia is likely related to alcohol use.   John We recommend he discontinue alcohol and tobacco.  He will follow-up with cardiology for management of atrial fibrillation and CHF.  I was present for greater than 50% of today's visit.  I perform medical decision making.  Julieanne Manson, MD

## 2020-12-02 LAB — ERYTHROPOIETIN: Erythropoietin: 10.1 m[IU]/mL (ref 2.6–18.5)

## 2020-12-05 ENCOUNTER — Other Ambulatory Visit (HOSPITAL_COMMUNITY): Payer: Self-pay

## 2020-12-07 ENCOUNTER — Ambulatory Visit: Payer: 59 | Admitting: Cardiology

## 2020-12-08 ENCOUNTER — Other Ambulatory Visit (HOSPITAL_COMMUNITY): Payer: Self-pay

## 2020-12-11 ENCOUNTER — Other Ambulatory Visit: Payer: Self-pay

## 2020-12-11 ENCOUNTER — Other Ambulatory Visit (HOSPITAL_COMMUNITY): Payer: Self-pay

## 2020-12-11 ENCOUNTER — Ambulatory Visit (HOSPITAL_COMMUNITY)
Admission: RE | Admit: 2020-12-11 | Discharge: 2020-12-11 | Disposition: A | Discharge status: Home / Self Care | Payer: 59 | Source: Ambulatory Visit | Attending: Cardiology | Admitting: Cardiology

## 2020-12-11 DIAGNOSIS — Z79899 Other long term (current) drug therapy: Secondary | ICD-10-CM | POA: Insufficient documentation

## 2020-12-11 DIAGNOSIS — I4891 Unspecified atrial fibrillation: Secondary | ICD-10-CM | POA: Insufficient documentation

## 2020-12-11 DIAGNOSIS — D751 Secondary polycythemia: Secondary | ICD-10-CM | POA: Diagnosis not present

## 2020-12-11 DIAGNOSIS — Z7901 Long term (current) use of anticoagulants: Secondary | ICD-10-CM | POA: Insufficient documentation

## 2020-12-11 DIAGNOSIS — F17220 Nicotine dependence, chewing tobacco, uncomplicated: Secondary | ICD-10-CM | POA: Diagnosis not present

## 2020-12-11 DIAGNOSIS — I5022 Chronic systolic (congestive) heart failure: Secondary | ICD-10-CM | POA: Diagnosis present

## 2020-12-11 DIAGNOSIS — I5021 Acute systolic (congestive) heart failure: Secondary | ICD-10-CM

## 2020-12-11 DIAGNOSIS — I5082 Biventricular heart failure: Secondary | ICD-10-CM | POA: Insufficient documentation

## 2020-12-11 LAB — BASIC METABOLIC PANEL
Anion gap: 8 (ref 5–15)
BUN: 14 mg/dL (ref 6–20)
CO2: 25 mmol/L (ref 22–32)
Calcium: 9.4 mg/dL (ref 8.9–10.3)
Chloride: 103 mmol/L (ref 98–111)
Creatinine, Ser: 1.02 mg/dL (ref 0.61–1.24)
GFR, Estimated: >60 mL/min (ref >60)
Glucose, Bld: 108 mg/dL — ABNORMAL HIGH (ref 70–99)
Potassium: 4.1 mmol/L (ref 3.5–5.1)
Sodium: 136 mmol/L (ref 135–145)

## 2020-12-11 LAB — DIGOXIN LEVEL: Digoxin Level: <0.2 ng/mL — ABNORMAL LOW (ref 0.8–2.0)

## 2020-12-11 MED ORDER — ENTRESTO 49-51 MG PO TABS: 1.0000 | ORAL_TABLET | Freq: Two times a day (BID) | ORAL | 11 refills | Status: DC

## 2020-12-11 MED ORDER — BISOPROLOL FUMARATE 5 MG PO TABS: 2.5000 mg | ORAL_TABLET | Freq: Every day | ORAL | 3 refills | Status: DC

## 2020-12-11 NOTE — Progress Notes (Signed)
PCP: [name] Primary Cardiologist: Oswaldo Milian  HPI:  Christopher Gutierrez is a 60 y.o.  male  with a PMH significant for Afib/Flutter, HTN, tobacco use disorder, and alcohol use  Prior to 10/2020, he was not being seen by a physician. Got established with Dr. [name] due to ongoing leg pain and shortness of breath. ECG was done in PCP office which showed Afib with RVR and he was referred to cardiology. He was seen by Dr. Gardiner Rhyme on 10/27/20 and remained in Afib with RVR. He declined admission and was started on carvedilol and Eliquis.  Symptoms worsened and he returned to clinic on 10/30/20 and was directly admitted for volume overload and Afib w/ RVR. ECHO demonstrated EF 20-25%, reduced RV function.  Started on IV diuretics with good response and received successful TEE/DCCV with restoration of NSR.  He declined beta blocker before discharge (associated carvedilol and metoprolol with scrotal edema) but was started on losartan and spironolactone.    He was seen by [facility] clinic on 11/08/20 for CHF TOC follow-up. Was still feeling weak and got SOB with ambulation. Had to stop several times while grocery shopping due to dyspnea. Was sleeping better, denied PND. Did not have a scale at home but weight was down 5 lbs. No CP since admission. Uncertain when decreased exercise capacity started due to his struggle with MSK pain limiting mobility and inability to tolerate pain medications. Continued to smoke but down to 2-3 cigarettes daily, using chewing tobacco and nicotine packets.  Today he returns to HF clinic for pharmacist medication titration. At last visit with MD, losartan was switched to Entresto, bisoprolol and digoxin were added, and furosemide changed to PRN. Today he is overall feeling OK. Continues to feel weak and fatigued - this is slightly better from last visit but still not back to his baseline. No dizziness, lightheadedness, chest pain, or palpitations. Breathing is fine. Home weight  stable 195 lbs, has not used furosemide since last visit. Physical activity is limited to chronic numbness in legs that started prior to hospitalization. Denies LEE, PND, orthopnea. He is eating a lot and is trying to stick to low-salt diet. Taking all medications as prescribed.  HF Medications: Furosemide 20 mg PRN Bisoprolol 2.5 mg daily Entresto 24/26 mg BID Spironolactone 12.5 mg daily Digoxin 0.125 mg daily  Has the patient been experiencing any side effects to the medications prescribed?  no  Does the patient have any problems obtaining medications due to transportation or finances?   no - Has [employer]  Understanding of regimen: good Understanding of indications: good Potential of compliance: good Patient understands to avoid NSAIDs. Patient understands to avoid decongestants.    Pertinent Lab Values: . Serum creatinine 1.02, BUN 14, Potassium 4.1, Sodium 136, Digoxin <0.2   Vital Signs: . Weight: 198.6 lbs (last clinic weight: 291 lbs) . Blood pressure: 152/98 mmHg . Heart rate: 69 bpm  Assessment/Plan: 1. Chronic Systolic CHF, Biventricular CHF:   - New diagnosis in the setting of Afib with RVR, alcohol use and heavy smoking history - 10/2020 EF 20%, BIV failure - NYHA Class III symptoms, dry on examination - Continue furosemide 20 mg PRN for fluid or edema - Continue bisoprolol 2.5 mg daily - Increase Entresto to 49/51 mg BID. BMET completed today. SCr and K stable from piror. - Continue spironolactone 12.5 mg daily - Continue digoxin 0.125 mg daily in the evening. Digoxin trough level <0.2 today. - Will need SGLT2i in the future. Wilder Glade copay is $  0. - Previously ordered sleep study - Follow-up office visit and ECHO on 01/26/21 with Dr. [name]  2. Afib: - New diagnosis 10/2020 but may have been in afib for some time. S/p successful TEE/DCCV on 11/01/20. CHADS2VASC score 2. - Continue Eliquis 5 mg BID  - Continue Bisoprolol 2.5 mg daily  3.  Tobacco Use Disorder: - Has cut back significantly but still using chewing tobacco and nicotine packets doesn't tolerate patches.   - Previously noted that he will try gum and lozenges in an attempt to stop tobacco products  4. Polycythemia: - Could be secondary to smoking, sleep apnea or hypoxemia from CHF. Referred to hematology for further workup and management.   5. Alcohol Use: - Previously noted to have stopped alcohol intake.  Richardine Service, PharmD, [place] PGY2 Cardiology Pharmacy Resident  Kerby Nora, PharmD, BCPS Heart Failure Clinic Pharmacist [phone]

## 2020-12-11 NOTE — Patient Instructions (Signed)
It was a pleasure seeing you today!  MEDICATIONS: -We are changing your medications today -Increase Entresto to 49/51 mg (1 tablet) twice daily. You can take 2 tablets twice daily of your current supply until you use up your current supply. -Call if you have questions about your medications.  LABS: -We will call you if your labs need attention.  NEXT APPOINTMENT: Return to clinic in 1 month with Dr. [name].  In general, to take care of your heart failure: -Limit your fluid intake to 2 Liters (half-gallon) per day.   -Limit your salt intake to ideally 2-3 grams (2000-3000 mg) per day. -Weigh yourself daily and record, and bring that "weight diary" to your next appointment.  (Weight gain of 2-3 pounds in 1 day typically means fluid weight.) -The medications for your heart are to help your heart and help you live longer.   -Please contact us before stopping any of your heart medications.  Call the clinic at [phone] with questions or to reschedule future appointments.

## 2020-12-13 ENCOUNTER — Encounter (HOSPITAL_BASED_OUTPATIENT_CLINIC_OR_DEPARTMENT_OTHER): Payer: 59 | Admitting: Cardiovascular Disease

## 2020-12-20 LAB — JAK 2 V617F (GENPATH)

## 2020-12-21 ENCOUNTER — Telehealth (HOSPITAL_COMMUNITY): Payer: Self-pay | Admitting: Surgery

## 2020-12-21 NOTE — Telephone Encounter (Signed)
I attempted to reach patient in reference to his ordered home sleep study.  I left a message to indicate that he should proceed with the study and reminded him of the PIN code needed.  I also encouraged him to call with any concerns or questions.

## 2020-12-22 ENCOUNTER — Other Ambulatory Visit (HOSPITAL_COMMUNITY): Payer: Self-pay | Admitting: Internal Medicine

## 2020-12-25 ENCOUNTER — Encounter (HOSPITAL_COMMUNITY): Payer: Self-pay

## 2020-12-25 ENCOUNTER — Encounter (HOSPITAL_COMMUNITY): Payer: Self-pay | Admitting: *Deleted

## 2020-12-25 ENCOUNTER — Telehealth: Payer: Self-pay

## 2020-12-25 NOTE — Telephone Encounter (Signed)
Called message left concerning lab results Jak2 negative and erythropoietin WNL follow up as scheduled  Encouraged to call for any questions concerns or changes

## 2020-12-25 NOTE — Telephone Encounter (Signed)
-----   Message from Owens Shark, NP sent at 12/22/2020  4:03 PM EDT ----- Please let him know the JAK2 testing returned negative, erythropoietin in normal range.  Follow-up as scheduled.

## 2020-12-28 ENCOUNTER — Encounter (HOSPITAL_COMMUNITY): Payer: Self-pay

## 2020-12-29 ENCOUNTER — Encounter (HOSPITAL_COMMUNITY): Payer: Self-pay | Admitting: *Deleted

## 2021-01-20 ENCOUNTER — Other Ambulatory Visit: Payer: Self-pay | Admitting: Family Medicine

## 2021-01-20 DIAGNOSIS — I10 Essential (primary) hypertension: Secondary | ICD-10-CM

## 2021-01-26 ENCOUNTER — Other Ambulatory Visit: Payer: Self-pay

## 2021-01-26 ENCOUNTER — Ambulatory Visit (HOSPITAL_BASED_OUTPATIENT_CLINIC_OR_DEPARTMENT_OTHER)
Admission: RE | Admit: 2021-01-26 | Discharge: 2021-01-26 | Disposition: A | Discharge status: Home / Self Care | Payer: 59 | Source: Ambulatory Visit | Attending: Internal Medicine | Admitting: Internal Medicine

## 2021-01-26 ENCOUNTER — Ambulatory Visit (HOSPITAL_COMMUNITY)
Admission: RE | Admit: 2021-01-26 | Discharge: 2021-01-26 | Disposition: A | Discharge status: Home / Self Care | Payer: 59 | Source: Ambulatory Visit | Attending: Internal Medicine | Admitting: Internal Medicine

## 2021-01-26 VITALS — BP 156/106 | HR 60 | Ht 74.0 in | Wt 197.2 lb

## 2021-01-26 DIAGNOSIS — I739 Peripheral vascular disease, unspecified: Secondary | ICD-10-CM

## 2021-01-26 DIAGNOSIS — E785 Hyperlipidemia, unspecified: Secondary | ICD-10-CM | POA: Diagnosis not present

## 2021-01-26 DIAGNOSIS — I4891 Unspecified atrial fibrillation: Secondary | ICD-10-CM | POA: Diagnosis not present

## 2021-01-26 DIAGNOSIS — I5022 Chronic systolic (congestive) heart failure: Secondary | ICD-10-CM

## 2021-01-26 DIAGNOSIS — I110 Hypertensive heart disease with heart failure: Secondary | ICD-10-CM | POA: Insufficient documentation

## 2021-01-26 DIAGNOSIS — R0602 Shortness of breath: Secondary | ICD-10-CM | POA: Diagnosis not present

## 2021-01-26 DIAGNOSIS — Z8249 Family history of ischemic heart disease and other diseases of the circulatory system: Secondary | ICD-10-CM | POA: Diagnosis not present

## 2021-01-26 DIAGNOSIS — D751 Secondary polycythemia: Secondary | ICD-10-CM

## 2021-01-26 DIAGNOSIS — Z720 Tobacco use: Secondary | ICD-10-CM

## 2021-01-26 DIAGNOSIS — I4819 Other persistent atrial fibrillation: Secondary | ICD-10-CM | POA: Insufficient documentation

## 2021-01-26 DIAGNOSIS — F1020 Alcohol dependence, uncomplicated: Secondary | ICD-10-CM | POA: Insufficient documentation

## 2021-01-26 LAB — BASIC METABOLIC PANEL
Anion gap: 7 (ref 5–15)
BUN: 10 mg/dL (ref 6–20)
CO2: 27 mmol/L (ref 22–32)
Calcium: 9.6 mg/dL (ref 8.9–10.3)
Chloride: 103 mmol/L (ref 98–111)
Creatinine, Ser: 1.03 mg/dL (ref 0.61–1.24)
GFR, Estimated: >60 mL/min (ref >60)
Glucose, Bld: 99 mg/dL (ref 70–99)
Potassium: 3.9 mmol/L (ref 3.5–5.1)
Sodium: 137 mmol/L (ref 135–145)

## 2021-01-26 LAB — CBC
HCT: 52.4 % — ABNORMAL HIGH (ref 39.0–52.0)
Hemoglobin: 18 g/dL — ABNORMAL HIGH (ref 13.0–17.0)
MCH: 33.9 pg (ref 26.0–34.0)
MCHC: 34.4 g/dL (ref 30.0–36.0)
MCV: 98.7 fL (ref 80.0–100.0)
Platelets: 152 10*3/uL (ref 150–400)
RBC: 5.31 MIL/uL (ref 4.22–5.81)
RDW: 14.2 % (ref 11.5–15.5)
WBC: 7.3 10*3/uL (ref 4.0–10.5)
nRBC: 0 % (ref 0.0–0.2)

## 2021-01-26 LAB — LIPID PANEL
Cholesterol: 229 mg/dL — ABNORMAL HIGH (ref 0–200)
HDL: 69 mg/dL (ref >40)
LDL Cholesterol: 139 mg/dL — ABNORMAL HIGH (ref 0–99)
Total CHOL/HDL Ratio: 3.3 RATIO
Triglycerides: 105 mg/dL (ref <150)
VLDL: 21 mg/dL (ref 0–40)

## 2021-01-26 LAB — ECHOCARDIOGRAM COMPLETE
Area-P 1/2: 3.21 cm2
S' Lateral: 3.7 cm
Single Plane A4C EF: 41.4 %

## 2021-01-26 LAB — BRAIN NATRIURETIC PEPTIDE: B Natriuretic Peptide: 385.9 pg/mL — ABNORMAL HIGH (ref 0.0–100.0)

## 2021-01-26 MED ORDER — NICOTINE 10 MG/ML NA SOLN: NASAL | 2 refills | Status: DC

## 2021-01-26 MED ORDER — ROSUVASTATIN CALCIUM 20 MG PO TABS
20.0000 mg | ORAL_TABLET | Freq: Every day | ORAL | 6 refills | Status: DC
Start: 2021-01-26 — End: 2021-09-04

## 2021-01-26 MED ORDER — ENTRESTO 97-103 MG PO TABS: 1.0000 | ORAL_TABLET | Freq: Two times a day (BID) | ORAL | 6 refills | Status: DC

## 2021-01-26 NOTE — Progress Notes (Signed)
[facility]ED [facility] CLINIC CONSULT NOTE  Referring Physician:Dr. Oswaldo Gutierrez Primary Care: Christopher Gutierrez Primary Cardiologist: Christopher Gutierrez   HPI:   Christopher Gutierrez is a 60 y.o.  male  with a PMH significant for Afib/Flutter, HTN, Tobacco use disorder, Alcohol use   Prior to 10/2020 not really seeing a physician.  Established with a new PCP Dr. Zigmund Andreea Gutierrez due to ongoing leg pain and shortness of breath.  ecg done in pcp office which showed afib with rvr and he was referred to cardiology Dr. Gardiner Gutierrez and seen on 10/27/20 remained in afib w/rvr was offered admission but declined and was started on carvedilol and eliquis.  Symptoms worsened and he returned to clinic on 10/30/20 and was direct admitted for volume overload and afib w/ rvr.  Had an ECHO which demonstrated EF 20-25%, reduced RV function.  Started on IV diuretics with good response and received successful TEE/DCCV with restoration of NSR.  He declined bb before discharge (associates carvedilol and metoprolol with scrotal edema) but was started on losartan and spironolactone.     He is uncertain when he first noticed decreased exercise capacity because of his struggle with MSK pain limiting mobility and inability to tolerate pain killers.  Thinks it may have begun a few years ago.     Chest pain across the shoulder blades a few weeks ago before admission which was associated with palpitations and the feeling of his heart racing hasn't had any chest pain since.     Smoking 25-30 years quits on and off for a few months here and there a few times over the years.  Historically smoking 1ppd or more.    Last seen by clinical pharmacist was doing well, remained hypertensive, entresto increased.     Still smoking around 5 cigs per day.  Shortness of breath has been doing well, doing home repairs, yard work.  Denies chest pain, PND.  Does not check bp at home.  Weight has been stable, has not needed to take the PRN lasix.  He has been  having a lot of trouble with claudication in the left leg.      Past Medical History:  Diagnosis Date   Atrial fibrillation ([place]) 10/24/2020   Atrial fibrillation and flutter ([place])    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   Hypertension     Current Outpatient Medications  Medication Sig Dispense Refill   acetaminophen (TYLENOL) 325 MG tablet Take 325 mg by mouth as needed.     apixaban (ELIQUIS) 5 MG TABS tablet Take 1 tablet (5 mg total) by mouth 2 (two) times daily. 180 tablet 3   bisoprolol (ZEBETA) 5 MG tablet Take 0.5 tablets (2.5 mg total) by mouth daily. 45 tablet 3   cetirizine (ZYRTEC) 10 MG tablet Take 10 mg by mouth as needed for allergies.     digoxin (LANOXIN) 0.125 MG tablet Take 1 tablet (0.125 mg total) by mouth daily. 90 tablet 3   Famotidine-Ca Carb-Mag Hydrox (PEPCID COMPLETE PO) Take 1 tablet by mouth as needed.     furosemide (LASIX) 20 MG tablet Take 1 tablet (20 mg total) by mouth daily as needed for fluid or edema. Pt must keep upcoming appt in June for further refills 30 tablet 0   lisinopril-hydrochlorothiazide (ZESTORETIC) 20-25 MG tablet Take 1 tablet by mouth every evening. 30 tablet 2   spironolactone (ALDACTONE) 25 MG tablet Take 0.5 tablets (12.5 mg total) by mouth daily. 15 tablet 5  Throat Lozenges (RA VITAMIN C COUGH DROPS MT) Use as directed in the mouth or throat. Takes a few daily     pravastatin (PRAVACHOL) 40 MG tablet Take 40 mg by mouth daily. (Patient not taking: Reported on 01/26/2021)     No current facility-administered medications for this encounter.    Allergies  Allergen Reactions   Beta Adrenergic Blockers Other (See Comments)    Scrotal edema with metoprolol and carvedilol   Tape Rash    Plastic tape      Social History   Socioeconomic History   Marital status: Divorced    Spouse name: Not on file   Number of children: 1   Years of education: Not on file   Highest education level: Master's degree  (e.g., MA, MS, MEng, MEd, MSW, MBA)  Occupational History   Not on file  Tobacco Use   Smoking status: Some Days    Packs/day: 1.50    Years: 25.00    Pack years: 37.50    Types: Cigarettes   Smokeless tobacco: Current    Types: Chew  Vaping Use   Vaping Use: Never used  Substance and Sexual Activity   Alcohol use: Yes    Alcohol/week: 24.0 standard drinks    Types: 24 Cans of beer per week    Comment: 6 cans every other day   Drug use: Yes    Frequency: 2.0 times per week    Types: Marijuana    Comment: he took Marijuana 2 weeks ago   Sexual activity: Yes    Partners: Female  Other Topics Concern   Not on file  Social History Narrative   Not on file   Social Determinants of Health   Financial Resource Strain: Low Risk    Difficulty of Paying Living Expenses: Not hard at all  Food Insecurity: No Food Insecurity   Worried About Charity fundraiser in the Last Year: Never true   Arboriculturist in the Last Year: Never true  Transportation Needs: No Transportation Needs   Lack of Transportation (Medical): No   Lack of Transportation (Non-Medical): No  Physical Activity: Not on file  Stress: Not on file  Social Connections: Not on file  Intimate Partner Violence: Not on file      Family History  Problem Relation Age of Onset   Gout Brother    Hypertension Brother    COPD Maternal Uncle    Stroke Paternal Aunt    Birth defects Paternal Aunt     Vitals:   01/26/21 1110  BP: (!) 156/106  Pulse: 60  SpO2: 97%  Weight: 89.4 kg (197 lb 3.2 oz)  Height: 6\' 2"  (1.88 m)    PHYSICAL EXAM: General:  Well appearing. No respiratory difficulty HEENT: normal Neck: supple. no JVD. Carotids 2+ bilat; no bruits. No lymphadenopathy or thryomegaly appreciated. Cor: PMI nondisplaced. Regular rate & rhythm. No rubs, gallops or murmurs. Lungs: clear Abdomen: soft, nontender, nondistended. No hepatosplenomegaly. No bruits or masses. Good bowel sounds. Extremities: decreased  dorsalis pedis left foot, preserved on right foot, no cyanosis, clubbing, rash, edema, legs are warm bilaterally, thinning of hair bilateral lowe legs.   Neuro: alert & oriented x 3, cranial nerves grossly intact. moves all 4 extremities w/o difficulty. Affect pleasant.   ASSESSMENT & PLAN:   Chronic Systolic CHF, Biventricular CHF:   -New diagnosis in the setting of afib w/rvr, alcohol use and heavy smoking history -10/2020 EF 20%, BIV failure -ECHO today 01/26/21 with  significant improvement in EF 45-50%, normal RV function -NYHA Class II symptoms, euvolemic on exam -continue spiro 12.5 -Increase entresto to 97/103 -stop digoxin -continue bisoprolol 2.5 -continue lasix  PRN -ordered Christopher Gutierrez sleep study last visit has everything he needs at home he has not completed this yet we encouraged him to do so   2. Persistent Afib: -new diagnosis 10/2020 but may have been in afib for some time -s/p TEE/DCCV 11/01/20 -CHADS2VASC 2, continue eliquis -ordered Christopher Gutierrez sleep study last visit has everything he needs at home he has not completed this yet we encouraged him to do so -remains in NSR, continue bisoprolol 2.5   3. Tobacco Use Disorder: -Still smoking, more habitual than craving patches and lozenges did not work -switch cessation aid to nicotine inhaler   4. Polycythemia: -Likely secondary to smoking, sleep apnea -Hematology following Jak 2 negative   5. Alcohol Use: -Continued to cut back on alcohol use rarely drinking now  6. Left Leg Claudication: -ordered ABI's -encouraged walking program -restarted his statin, continue eliquis -discussed importance of complete smoking cessation  Guadlupe Spanish, MD  Patient seen and examined with the above-signed Advanced Practice Provider and/or Housestaff. I personally reviewed laboratory data, imaging studies and relevant notes. I independently examined the patient and formulated the important aspects of the plan. I have edited the note to  reflect any of my changes or salient points. I have personally discussed the plan with the patient and/or family.  He is much improved. NYHA II. EF 45-50% on echo today. Remains in NSR. No bleeding on Eliquis. Has cut back ETOH significantly. Still smoking.   General:  Well appearing. No resp difficulty HEENT: normal Neck: supple. no JVD. Carotids 2+ bilat; no bruits. No lymphadenopathy or thryomegaly appreciated. Cor: PMI nondisplaced. Regular rate & rhythm. No rubs, gallops or murmurs. Lungs: clear Abdomen: soft, nontender, nondistended. No hepatosplenomegaly. No bruits or masses. Good bowel sounds. Extremities: no cyanosis, clubbing, rash, edema Neuro: alert & orientedx3, cranial nerves grossly intact. moves all 4 extremities w/o difficulty. Affect pleasant  NYHA II. EF much improved on echo now 45-50%. Increase Entresto. Stop digoxin. Encouraged smoking cessation and to complete sleep study. Labs today, Can likely transfer care to [facility] at next visit.   Glori Bickers, MD  11:29 PM

## 2021-01-26 NOTE — Patient Instructions (Signed)
Stop Digoxin  Start Crestor 20 mg Daily  Start Nicotrol Inhaler  Increase Entresto to 97/103 mg Twice daily   Labs done today, your results will be available in MyChart, we will contact you for abnormal readings.  Your physician has requested that you have an ankle brachial index (ABI). During this test an ultrasound and blood pressure cuff are used to evaluate the arteries that supply the arms and legs with blood. Allow thirty minutes for this exam. There are no restrictions or special instructions.  Your physician recommends that you schedule a follow-up appointment in: 3-4 months  We have provided you a note to remain out of work until your next appointment  If you have any questions or concerns before your next appointment please send [place] a message through [place] or call our office at [phone].    TO LEAVE A MESSAGE FOR THE NURSE SELECT OPTION 2, PLEASE LEAVE A MESSAGE INCLUDING: YOUR NAME DATE OF BIRTH CALL BACK NUMBER REASON FOR CALL**this is important as we prioritize the call backs  YOU WILL RECEIVE A CALL BACK THE SAME DAY AS LONG AS YOU CALL BEFORE 4:00 PM  At the [facility], you and your health needs are our priority. As part of our continuing mission to provide you with exceptional heart care, we have created designated Provider Care Teams. These Care Teams include your primary Cardiologist (physician) and Advanced Practice Providers (APPs- Physician Assistants and Nurse Practitioners) who all work together to provide you with the care you need, when you need it.   You may see any of the following providers on your designated Care Team at your next follow up: Dr Glori Bickers Dr Loralie Champagne Dr Patrice Paradise, NP Lyda Jester, [place] Ginnie Smart Audry Riles, PharmD   Please be sure to bring in all your medications bottles to every appointment.

## 2021-01-26 NOTE — Progress Notes (Signed)
  Echocardiogram 2D Echocardiogram has been performed.  Matilde Bash 01/26/2021, 11:13 AM

## 2021-01-30 ENCOUNTER — Encounter (HOSPITAL_COMMUNITY): Payer: Self-pay

## 2021-01-30 ENCOUNTER — Other Ambulatory Visit: Payer: Self-pay

## 2021-01-30 ENCOUNTER — Ambulatory Visit (HOSPITAL_COMMUNITY)
Admission: RE | Admit: 2021-01-30 | Discharge: 2021-01-30 | Disposition: A | Discharge status: Home / Self Care | Payer: 59 | Source: Ambulatory Visit | Attending: Internal Medicine | Admitting: Internal Medicine

## 2021-01-30 DIAGNOSIS — I739 Peripheral vascular disease, unspecified: Secondary | ICD-10-CM | POA: Diagnosis present

## 2021-02-02 ENCOUNTER — Encounter: Payer: Self-pay | Admitting: *Deleted

## 2021-02-02 ENCOUNTER — Telehealth: Payer: Self-pay | Admitting: *Deleted

## 2021-02-02 DIAGNOSIS — I739 Peripheral vascular disease, unspecified: Secondary | ICD-10-CM

## 2021-02-02 NOTE — Telephone Encounter (Signed)
Left a message for the patient to call back to set up an appointment with Dr. Fletcher Anon on 02/06/21 and to get the Doppler scheduled.   Per Dr. Fletcher Anon: This patient seems to have severe PAD.  Please schedule an urgent LEA and office visit with me July 5th.

## 2021-02-06 ENCOUNTER — Encounter (HOSPITAL_COMMUNITY): Payer: Self-pay

## 2021-02-06 NOTE — Telephone Encounter (Signed)
MyChart message sent to the patient as well.

## 2021-02-07 ENCOUNTER — Ambulatory Visit (HOSPITAL_COMMUNITY)
Admission: RE | Admit: 2021-02-07 | Discharge: 2021-02-07 | Disposition: A | Discharge status: Home / Self Care | Payer: 59 | Source: Ambulatory Visit | Attending: Internal Medicine | Admitting: Internal Medicine

## 2021-02-07 ENCOUNTER — Other Ambulatory Visit (HOSPITAL_COMMUNITY): Payer: Self-pay | Admitting: Cardiovascular Disease

## 2021-02-07 ENCOUNTER — Other Ambulatory Visit: Payer: Self-pay

## 2021-02-07 DIAGNOSIS — I739 Peripheral vascular disease, unspecified: Secondary | ICD-10-CM

## 2021-02-07 DIAGNOSIS — I708 Atherosclerosis of other arteries: Secondary | ICD-10-CM

## 2021-02-07 DIAGNOSIS — Z95828 Presence of other vascular implants and grafts: Secondary | ICD-10-CM

## 2021-02-13 ENCOUNTER — Ambulatory Visit (INDEPENDENT_AMBULATORY_CARE_PROVIDER_SITE_OTHER): Payer: 59 | Admitting: Cardiovascular Disease

## 2021-02-13 ENCOUNTER — Encounter: Payer: Self-pay | Admitting: Cardiovascular Disease

## 2021-02-13 ENCOUNTER — Other Ambulatory Visit: Payer: Self-pay

## 2021-02-13 VITALS — BP 150/98 | HR 55 | Resp 18 | Ht 74.0 in | Wt 197.0 lb

## 2021-02-13 DIAGNOSIS — Z720 Tobacco use: Secondary | ICD-10-CM

## 2021-02-13 DIAGNOSIS — I5022 Chronic systolic (congestive) heart failure: Secondary | ICD-10-CM

## 2021-02-13 DIAGNOSIS — I4819 Other persistent atrial fibrillation: Secondary | ICD-10-CM

## 2021-02-13 DIAGNOSIS — E785 Hyperlipidemia, unspecified: Secondary | ICD-10-CM

## 2021-02-13 DIAGNOSIS — I739 Peripheral vascular disease, unspecified: Secondary | ICD-10-CM

## 2021-02-13 MED ORDER — ASPIRIN EC 81 MG PO TBEC: 81.0000 mg | DELAYED_RELEASE_TABLET | Freq: Every day | ORAL | 3 refills | Status: DC

## 2021-02-13 MED ORDER — SODIUM CHLORIDE 0.9% FLUSH: 3.0000 mL | Freq: Two times a day (BID) | INTRAVENOUS | Status: DC

## 2021-02-13 NOTE — Progress Notes (Signed)
Cardiology Office Note   Date:  02/13/2021   ID:  Christopher Gutierrez, DOB 25-Apr-1961, MRN [id number]  PCP:  [name], DO  Cardiologist: Dr. [name].  No chief complaint on file.     History of Present Illness: Christopher Gutierrez is a 60 y.o. male who was referred by Dr. [name] for evaluation management of peripheral arterial disease.  He has known history of atrial fibrillation/flutter, essential hypertension, tobacco use, and chronic systolic heart failure with severely reduced LV systolic function. The patient was diagnosed with heart failure in March in the setting of atrial fibrillation with rapid ventricular response.  Echocardiogram showed an EF of 20 to 25%.  He underwent successful TEE guided cardioversion.  Heart failure improved gradually with medical therapy with most recent ejection fraction 40 to 45%.  He has been mostly limited by severe left calf claudication.  This started about 1 year ago but has progressed significantly over the last few months.  Symptoms are now happening after walking few steps.  In addition, he has numbness in his toes mostly at rest.  Also when he is sitting certain position, there is dark discoloration in his left toes with subsequent discomfort forcing him to stand and change position.  Recent lower extremity arterial Doppler studies showed an ABI of 0.76 on the right and 0.41 on the left with no flow detected in the toe.  Duplex on the right showed severe mid SFA stenosis with occluded posterior tibial artery.  On the left, the SFA was occluded in the mid to distal segment with occluded posterior tibial artery.  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  He is currently on short-term disability.  He is clearly limited by his claudication and rest pain.  He cut down on tobacco use to half a pack per day.  There is history of excessive alcohol use but not recently.  Past Medical History:   Diagnosis Date   Atrial fibrillation ([place]) 10/24/2020   Atrial fibrillation and flutter ([place])    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   Hypertension     Past Surgical History:  Procedure Laterality Date   CARDIOVERSION N/A 11/01/2020   Procedure: CARDIOVERSION;  Surgeon: Jerline Pain, MD;  Location: [facility] ENDOSCOPY;  Service: Cardiovascular;  Laterality: N/A;   CATARACT EXTRACTION Right    MOUTH SURGERY  2013   TEE WITHOUT CARDIOVERSION N/A 11/01/2020   Procedure: TRANSESOPHAGEAL ECHOCARDIOGRAM (TEE);  Surgeon: Jerline Pain, MD;  Location: [facility] ENDOSCOPY;  Service: Cardiovascular;  Laterality: N/A;     Current Outpatient Medications  Medication Sig Dispense Refill   acetaminophen (TYLENOL) 325 MG tablet Take 325 mg by mouth as needed.     apixaban (ELIQUIS) 5 MG TABS tablet Take 1 tablet (5 mg total) by mouth 2 (two) times daily. 180 tablet 3   aspirin EC 81 MG tablet Take 1 tablet (81 mg total) by mouth daily. Swallow whole. 90 tablet 3   bisoprolol (ZEBETA) 5 MG tablet Take 0.5 tablets (2.5 mg total) by mouth daily. 45 tablet 3   cetirizine (ZYRTEC) 10 MG tablet Take 10 mg by mouth as needed for allergies.     Famotidine-Ca Carb-Mag Hydrox (PEPCID COMPLETE PO) Take 1 tablet by mouth as needed.     Nicotine 10 MG/ML SOLN 1-2 puffs as needed 10 mL 2   rosuvastatin (CRESTOR) 20 MG tablet Take 1 tablet (  20 mg total) by mouth daily. 30 tablet 6   sacubitril-valsartan (ENTRESTO) 97-103 MG Take 1 tablet by mouth 2 (two) times daily. 60 tablet 6   spironolactone (ALDACTONE) 25 MG tablet Take 0.5 tablets (12.5 mg total) by mouth daily. 15 tablet 5   Throat Lozenges (RA VITAMIN C COUGH DROPS MT) Use as directed in the mouth or throat. Takes a few daily     No current facility-administered medications for this visit.    Allergies:   Beta adrenergic blockers and Tape    Social History:  The patient  reports that he has been smoking cigarettes. He has a 37.50  pack-year smoking history. His smokeless tobacco use includes chew. He reports current alcohol use of about 24.0 standard drinks of alcohol per week. He reports current drug use. Frequency: 2.00 times per week. Drug: Marijuana.   Family History:  The patient's family history includes Birth defects in his paternal aunt; COPD in his maternal uncle; Gout in his brother; Hypertension in his brother; Stroke in his paternal aunt.    ROS:  Please see the history of present illness.   Otherwise, review of systems are positive for .   All other systems are reviewed and negative.    PHYSICAL EXAM: VS:  BP (!) 150/98   Pulse (!) 55   Resp 18   Ht 6\' 2"  (1.88 m)   Wt 197 lb (89.4 kg)   SpO2 99%   BMI 25.29 kg/m  , BMI Body mass index is 25.29 kg/m. GEN: Well nourished, well developed, in no acute distress  HEENT: normal  Neck: no JVD, carotid bruits, or masses Cardiac: RRR; no murmurs, rubs, or gallops,no edema  Respiratory:  clear to auscultation bilaterally, normal work of breathing GI: soft, nontender, nondistended, + BS MS: no deformity or atrophy  Skin: warm and dry, no rash Neuro:  Strength and sensation are intact Psych: euthymic mood, full affect Vascular: Femoral pulses +2 on the right and +1 on the left.  Dorsalis pedis is +1 on the right and not palpable on the left.  Posterior tibial is not palpable bilaterally.  There is dependent rubor on the left   EKG:  EKG is ordered today. The ekg ordered today demonstrates sinus bradycardia with old septal infarct.   Recent Labs: 10/12/2020: TSH 2.06 10/30/2020: ALT 30 11/01/2020: Magnesium 2.2 01/26/2021: B Natriuretic Peptide 385.9; BUN 10; Creatinine, Ser 1.03; Hemoglobin 18.0; Platelets 152; Potassium 3.9; Sodium 137    Lipid Panel    Component Value Date/Time   CHOL 229 (H) 01/26/2021 1213   TRIG 105 01/26/2021 1213   HDL 69 01/26/2021 1213   CHOLHDL 3.3 01/26/2021 1213   VLDL 21 01/26/2021 1213   LDLCALC 139 (H) 01/26/2021  1213      Wt Readings from Last 3 Encounters:  02/13/21 197 lb (89.4 kg)  01/26/21 197 lb 3.2 oz (89.4 kg)  12/11/20 198 lb 9.6 oz (90.1 kg)        No flowsheet data found.    ASSESSMENT AND PLAN:  1.  Peripheral arterial disease: Severe left lower extremity claudication and rest pain.  [place]uther[place] class IV.  The patient has severe ischemia affecting the left lower extremity.  His symptoms are clearly disabling and he has been trying to increase his activities but has not been able to do so. Given severity of his symptoms, I recommend proceeding with abdominal aortogram with lower extremity runoff and possible endovascular intervention.  I discussed the procedure in details  as well as risk and benefits. The patient reports that he has no one at home to stay with him the day of the procedure and we might have to keep him overnight especially that he is on anticoagulation with Eliquis. Hold Eliquis 2 days before and start aspirin 81 mg daily.  2.  Chronic systolic heart failure: He appears to be euvolemic without furosemide.  He is currently on bisoprolol, spironolactone and Entresto. His blood pressure remains uncontrolled and this raises the possibility of renal artery stenosis which will be evaluated with upcoming angiogram.  3.  Persistent atrial fibrillation: He is maintaining in sinus rhythm and tolerating anticoagulation.  4.  Tobacco use: He cut down but has not been able to quit smoking completely.  He understands that he needs to quit.  5.  Hyperlipidemia: I reviewed most recent lipid profile which showed an LDL of 139.  Not entirely sure he has been on rosuvastatin long enough.  For now, I recommend continuing rosuvastatin and will recheck his lipid profile in few months.  If LDL remains above 70, we will plan on adding Zetia.  6.  Short-term disability: The patient is trying to extend his short-term disability which I think is warranted given his degree of claudication and  rest pain in addition to his cardiac history.    Disposition: Proceed with angiogram next week and follow-up with me in 1 month.  Signed,  Kathlyn Sacramento, MD  02/13/2021 1:18 PM    [place]

## 2021-02-13 NOTE — H&P (View-Only) (Signed)
Cardiology Office Note   Date:  02/13/2021   ID:  Christopher Gutierrez, DOB 1961-03-07, MRN [id number]  PCP:  [name], DO  Cardiologist: Dr. [name].  No chief complaint on file.     History of Present Illness: Christopher Gutierrez is a 60 y.o. male who was referred by Dr. [name] for evaluation management of peripheral arterial disease.  He has known history of atrial fibrillation/flutter, essential hypertension, tobacco use, and chronic systolic heart failure with severely reduced LV systolic function. The patient was diagnosed with heart failure in March in the setting of atrial fibrillation with rapid ventricular response.  Echocardiogram showed an EF of 20 to 25%.  He underwent successful TEE guided cardioversion.  Heart failure improved gradually with medical therapy with most recent ejection fraction 40 to 45%.  He has been mostly limited by severe left calf claudication.  This started about 1 year ago but has progressed significantly over the last few months.  Symptoms are now happening after walking few steps.  In addition, he has numbness in his toes mostly at rest.  Also when he is sitting certain position, there is dark discoloration in his left toes with subsequent discomfort forcing him to stand and change position.  Recent lower extremity arterial Doppler studies showed an ABI of 0.76 on the right and 0.41 on the left with no flow detected in the toe.  Duplex on the right showed severe mid SFA stenosis with occluded posterior tibial artery.  On the left, the SFA was occluded in the mid to distal segment with occluded posterior tibial artery.  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  He is currently on short-term disability.  He is clearly limited by his claudication and rest pain.  He cut down on tobacco use to half a pack per day.  There is history of excessive alcohol use but not recently.  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   Hypertension     Past Surgical History:  Procedure Laterality Date   CARDIOVERSION N/A 11/01/2020   Procedure: CARDIOVERSION;  Surgeon: Jerline Pain, MD;  Location: [facility] ENDOSCOPY;  Service: Cardiovascular;  Laterality: N/A;   CATARACT EXTRACTION Right    MOUTH SURGERY  2013   TEE WITHOUT CARDIOVERSION N/A 11/01/2020   Procedure: TRANSESOPHAGEAL ECHOCARDIOGRAM (TEE);  Surgeon: Jerline Pain, MD;  Location: [facility] ENDOSCOPY;  Service: Cardiovascular;  Laterality: N/A;     Current Outpatient Medications  Medication Sig Dispense Refill   acetaminophen (TYLENOL) 325 MG tablet Take 325 mg by mouth as needed.     apixaban (ELIQUIS) 5 MG TABS tablet Take 1 tablet (5 mg total) by mouth 2 (two) times daily. 180 tablet 3   aspirin EC 81 MG tablet Take 1 tablet (81 mg total) by mouth daily. Swallow whole. 90 tablet 3   bisoprolol (ZEBETA) 5 MG tablet Take 0.5 tablets (2.5 mg total) by mouth daily. 45 tablet 3   cetirizine (ZYRTEC) 10 MG tablet Take 10 mg by mouth as needed for allergies.     Famotidine-Ca Carb-Mag Hydrox (PEPCID COMPLETE PO) Take 1 tablet by mouth as needed.     Nicotine 10 MG/ML SOLN 1-2 puffs as needed 10 mL 2   rosuvastatin (CRESTOR) 20 MG tablet Take 1 tablet (  20 mg total) by mouth daily. 30 tablet 6   sacubitril-valsartan (ENTRESTO) 97-103 MG Take 1 tablet by mouth 2 (two) times daily. 60 tablet 6   spironolactone (ALDACTONE) 25 MG tablet Take 0.5 tablets (12.5 mg total) by mouth daily. 15 tablet 5   Throat Lozenges (RA VITAMIN C COUGH DROPS MT) Use as directed in the mouth or throat. Takes a few daily     No current facility-administered medications for this visit.    Allergies:   Beta adrenergic blockers and Tape    Social History:  The patient  reports that he has been smoking cigarettes. He has a 37.50  pack-year smoking history. His smokeless tobacco use includes chew. He reports current alcohol use of about 24.0 standard drinks of alcohol per week. He reports current drug use. Frequency: 2.00 times per week. Drug: Marijuana.   Family History:  The patient's family history includes Birth defects in his paternal aunt; COPD in his maternal uncle; Gout in his brother; Hypertension in his brother; Stroke in his paternal aunt.    ROS:  Please see the history of present illness.   Otherwise, review of systems are positive for .   All other systems are reviewed and negative.    PHYSICAL EXAM: VS:  BP (!) 150/98   Pulse (!) 55   Resp 18   Ht 6\' 2"  (1.88 m)   Wt 197 lb (89.4 kg)   SpO2 99%   BMI 25.29 kg/m  , BMI Body mass index is 25.29 kg/m. GEN: Well nourished, well developed, in no acute distress  HEENT: normal  Neck: no JVD, carotid bruits, or masses Cardiac: RRR; no murmurs, rubs, or gallops,no edema  Respiratory:  clear to auscultation bilaterally, normal work of breathing GI: soft, nontender, nondistended, + BS MS: no deformity or atrophy  Skin: warm and dry, no rash Neuro:  Strength and sensation are intact Psych: euthymic mood, full affect Vascular: Femoral pulses +2 on the right and +1 on the left.  Dorsalis pedis is +1 on the right and not palpable on the left.  Posterior tibial is not palpable bilaterally.  There is dependent rubor on the left   EKG:  EKG is ordered today. The ekg ordered today demonstrates sinus bradycardia with old septal infarct.   Recent Labs: 10/12/2020: TSH 2.06 10/30/2020: ALT 30 11/01/2020: Magnesium 2.2 01/26/2021: B Natriuretic Peptide 385.9; BUN 10; Creatinine, Ser 1.03; Hemoglobin 18.0; Platelets 152; Potassium 3.9; Sodium 137    Lipid Panel    Component Value Date/Time   CHOL 229 (H) 01/26/2021 1213   TRIG 105 01/26/2021 1213   HDL 69 01/26/2021 1213   CHOLHDL 3.3 01/26/2021 1213   VLDL 21 01/26/2021 1213   LDLCALC 139 (H) 01/26/2021  1213      Wt Readings from Last 3 Encounters:  02/13/21 197 lb (89.4 kg)  01/26/21 197 lb 3.2 oz (89.4 kg)  12/11/20 198 lb 9.6 oz (90.1 kg)        No flowsheet data found.    ASSESSMENT AND PLAN:  1.  Peripheral arterial disease: Severe left lower extremity claudication and rest pain.  Rutherford class IV.  The patient has severe ischemia affecting the left lower extremity.  His symptoms are clearly disabling and he has been trying to increase his activities but has not been able to do so. Given severity of his symptoms, I recommend proceeding with abdominal aortogram with lower extremity runoff and possible endovascular intervention.  I discussed the procedure in details  as well as risk and benefits. The patient reports that he has no one at home to stay with him the day of the procedure and we might have to keep him overnight especially that he is on anticoagulation with Eliquis. Hold Eliquis 2 days before and start aspirin 81 mg daily.  2.  Chronic systolic heart failure: He appears to be euvolemic without furosemide.  He is currently on bisoprolol, spironolactone and Entresto. His blood pressure remains uncontrolled and this raises the possibility of renal artery stenosis which will be evaluated with upcoming angiogram.  3.  Persistent atrial fibrillation: He is maintaining in sinus rhythm and tolerating anticoagulation.  4.  Tobacco use: He cut down but has not been able to quit smoking completely.  He understands that he needs to quit.  5.  Hyperlipidemia: I reviewed most recent lipid profile which showed an LDL of 139.  Not entirely sure he has been on rosuvastatin long enough.  For now, I recommend continuing rosuvastatin and will recheck his lipid profile in few months.  If LDL remains above 70, we will plan on adding Zetia.  6.  Short-term disability: The patient is trying to extend his short-term disability which I think is warranted given his degree of claudication and  rest pain in addition to his cardiac history.    Disposition: Proceed with angiogram next week and follow-up with me in 1 month.  Signed,  Kathlyn Sacramento, MD  02/13/2021 1:18 PM    [place]

## 2021-02-13 NOTE — Patient Instructions (Signed)
Medication Instructions:  START: ASPIRIN 81mg  DAILY PLEASE FOLLOW MEDICATION INSTRUCTIONS LISTED ON PROCEDURE INSTRUCTION SHEET *If you need a refill on your cardiac medications before your next appointment, please call your pharmacy*  Testing/Procedures: Your physician has requested that you have a peripheral vascular angiogram. This exam is performed at the hospital. During this exam IV contrast is used to look at arterial blood flow. Please review the information sheet given for details.  Follow-Up: At [facility], you and your health needs are our priority.  As part of our continuing mission to provide you with exceptional heart care, we have created designated Provider Care Teams.  These Care Teams include your primary Cardiologist (physician) and Advanced Practice Providers (APPs -  Physician Assistants and Nurse Practitioners) who all work together to provide you with the care you need, when you need it.  Your next appointment:   3 week(s) POST PROCEDURE   The format for your next appointment:   In Person  Provider:   Kathlyn Sacramento, MD  Other Instructions    [place] [address] Dept: [phone] Loc: [phone]  Christopher Gutierrez  02/13/2021  You are scheduled for a Peripheral Angiogram on Wednesday, July 20 with Dr. Kathlyn Sacramento.  1. Please arrive at the [facility] (Main Entrance A) at [facility]: [address] [place], [place] [address] at 8:30 AM (This time is two hours before your procedure to ensure your preparation). Free valet parking service is available.   Special note: Every effort is made to have your procedure done on time. Please understand that emergencies sometimes delay scheduled procedures.  2. Diet: Do not eat solid foods after midnight.  The patient may have clear liquids until 5am upon the day of the procedure.  4.  Medication instructions in preparation for your procedure:   Contrast Allergy: No  Stop taking Eliquis (Apixiban) on Monday, July 18. 2 DAYS BEFORE PROCEDURE   On the morning of your procedure, take your Aspirin and any morning medicines NOT listed above.  You may use sips of water.  5. Plan for one night stay--bring personal belongings. 6. Bring a current list of your medications and current insurance cards. 7. You MUST have a responsible person to drive you home. 8. Someone MUST be with you the first 24 hours after you arrive home or your discharge will be delayed. 9. Please wear clothes that are easy to get on and off and wear slip-on shoes.  Thank you for allowing [place] to care for you!   -- [facility]

## 2021-02-15 ENCOUNTER — Telehealth (HOSPITAL_COMMUNITY): Payer: Self-pay | Admitting: *Deleted

## 2021-02-15 NOTE — Telephone Encounter (Signed)
Done, pt aware.

## 2021-02-15 NOTE — Telephone Encounter (Signed)
Pt sent another mychart message following up on disability.    "Please help my hr dept is threatening to terminate my benefits if i cannot suppy them with medical documentation Thank You"  "Hello I have spoken to my HR dept and they are requesting that I have an "Attending Physician Statement" filled out and returned (Attached)  If you could help me with this i would appreciate it Fax number  [phone]"   Routed to [facility]

## 2021-02-20 ENCOUNTER — Telehealth: Payer: Self-pay | Admitting: *Deleted

## 2021-02-20 DIAGNOSIS — I739 Peripheral vascular disease, unspecified: Secondary | ICD-10-CM

## 2021-02-20 DIAGNOSIS — I5022 Chronic systolic (congestive) heart failure: Secondary | ICD-10-CM

## 2021-02-20 NOTE — Telephone Encounter (Addendum)
Pt contacted pre-abdominal aortogram scheduled at [facility] for: Wednesday February 21, 2021 10:30 AM Verified arrival time and place: [facility]) at: 8:30 AM   No solid food after midnight prior to cath, clear liquids until 5 AM day of procedure.  Hold: Eliquis-pt reports he took dose this morning at 7:30 AM-knows not to take anymore until post procedure* Spironolactone-AM of procedure  Except hold medications AM meds can be  taken pre-cath with sips of water including: aspirin 81 mg   Confirmed patient has responsible adult to drive home post procedure and be with patient first 24 hours after arriving home:  You are allowed ONE visitor in the waiting room during the time you are at the hospital for your procedure. Both you and your visitor must wear a mask once you enter the hospital.   Patient reports does not currently have any symptoms concerning for COVID-19 and no household members with COVID-19 like illness.      * Patient reports he did not stop Eliquis yesterday (02/19/21)  as instructed and did take dose of Eliquis this morning at 7:30 AM.                           I will forward to Dr Fletcher Anon for review and recommendation about proceeding with abdominal aortogram 02/21/21.

## 2021-02-20 NOTE — Telephone Encounter (Addendum)
Per Dr Leland Her need to reschedule abdominal aortogram since patient took dose of Eliquis this morning 7:30 AM.  I have rescheduled abdominal aortogram to Wednesday March 07, 2021 with Dr Pandora Leiter 10 AM procedure for 12 Noon procedure. I have reviewed instructions with patient.  In addition: -Patient will plan to get BMP/CBC week of February 26, 2021 at [facility]. -Patient instructed -do not take Eliquis starting Monday March 05, 2021 until after procedure March 07, 2021.

## 2021-02-20 NOTE — Telephone Encounter (Signed)
Patient is returning call to discuss procedure instructions.

## 2021-02-21 NOTE — Telephone Encounter (Signed)
Thanks!  Anne

## 2021-02-23 ENCOUNTER — Ambulatory Visit: Payer: 59 | Admitting: Cardiovascular Disease

## 2021-02-27 ENCOUNTER — Other Ambulatory Visit: Payer: Self-pay

## 2021-02-27 DIAGNOSIS — I5021 Acute systolic (congestive) heart failure: Secondary | ICD-10-CM

## 2021-02-28 LAB — CBC
Hematocrit: 50.3 % (ref 37.5–51.0)
Hemoglobin: 17.6 g/dL (ref 13.0–17.7)
MCH: 34.3 pg — ABNORMAL HIGH (ref 26.6–33.0)
MCHC: 35 g/dL (ref 31.5–35.7)
MCV: 98 fL — ABNORMAL HIGH (ref 79–97)
Platelets: 148 10*3/uL — ABNORMAL LOW (ref 150–450)
RBC: 5.13 x10E6/uL (ref 4.14–5.80)
RDW: 11.6 % (ref 11.6–15.4)
WBC: 6.9 10*3/uL (ref 3.4–10.8)

## 2021-02-28 LAB — BASIC METABOLIC PANEL
BUN/Creatinine Ratio: 10 (ref 9–20)
BUN: 11 mg/dL (ref 6–24)
CO2: 23 mmol/L (ref 20–29)
Calcium: 9.3 mg/dL (ref 8.7–10.2)
Chloride: 101 mmol/L (ref 96–106)
Creatinine, Ser: 1.11 mg/dL (ref 0.76–1.27)
Glucose: 92 mg/dL (ref 65–99)
Potassium: 4.8 mmol/L (ref 3.5–5.2)
Sodium: 139 mmol/L (ref 134–144)
eGFR: 77 mL/min/{1.73_m2} (ref >59)

## 2021-03-05 ENCOUNTER — Telehealth: Payer: Self-pay | Admitting: *Deleted

## 2021-03-05 NOTE — Telephone Encounter (Signed)
Abdominal aortogram scheduled at [facility] for: Wednesday March 07, 2021 [facility] Main Entrance A [facility]) at: 10 AM   No solid food after midnight prior to cath, clear liquids until 5 AM day of procedure.  Hold:  -  Eliquis-hold 03/05/21 until post procedure  - Spironolactone-AM of procedure   -Except hold medication AM meds can be  taken pre-cath with sips of water including:   aspirin 81 mg   Confirmed patient has responsible adult to drive home post procedure and be with patient first 24 hours after arriving home.  Patients are allowed one visitor in the waiting room during the time they are at the hospital for their procedure. Both patient and visitor must wear a mask once they enter the hospital.   Patient reports does not currently have any symptoms concerning for COVID-19 and no household members with COVID-19 like illness.      Reviewed procedure/mask/visitor instructions with patient.

## 2021-03-07 ENCOUNTER — Encounter (HOSPITAL_COMMUNITY): Payer: Self-pay | Admitting: Cardiovascular Disease

## 2021-03-07 ENCOUNTER — Ambulatory Visit (HOSPITAL_COMMUNITY)
Admission: RE | Disposition: A | Discharge status: Home / Self Care | Payer: Self-pay | Source: Home / Self Care | Attending: Cardiovascular Disease

## 2021-03-07 ENCOUNTER — Ambulatory Visit (HOSPITAL_COMMUNITY)
Admission: RE | Admit: 2021-03-07 | Discharge: 2021-03-07 | Disposition: A | Discharge status: Home / Self Care | Payer: 59 | Source: Home / Self Care | Attending: Cardiovascular Disease | Admitting: Cardiovascular Disease

## 2021-03-07 ENCOUNTER — Other Ambulatory Visit: Payer: Self-pay | Admitting: *Deleted

## 2021-03-07 ENCOUNTER — Other Ambulatory Visit: Payer: Self-pay

## 2021-03-07 ENCOUNTER — Observation Stay (HOSPITAL_COMMUNITY)
Admission: EM | Admit: 2021-03-07 | Discharge: 2021-03-08 | Disposition: A | Discharge status: Home / Self Care | Payer: 59 | Attending: Cardiology | Admitting: Cardiology

## 2021-03-07 ENCOUNTER — Other Ambulatory Visit (HOSPITAL_COMMUNITY): Payer: Self-pay

## 2021-03-07 DIAGNOSIS — R58 Hemorrhage, not elsewhere classified: Secondary | ICD-10-CM | POA: Diagnosis present

## 2021-03-07 DIAGNOSIS — I110 Hypertensive heart disease with heart failure: Secondary | ICD-10-CM | POA: Insufficient documentation

## 2021-03-07 DIAGNOSIS — Z20822 Contact with and (suspected) exposure to covid-19: Secondary | ICD-10-CM | POA: Diagnosis not present

## 2021-03-07 DIAGNOSIS — I70213 Atherosclerosis of native arteries of extremities with intermittent claudication, bilateral legs: Secondary | ICD-10-CM | POA: Diagnosis not present

## 2021-03-07 DIAGNOSIS — Z888 Allergy status to other drugs, medicaments and biological substances status: Secondary | ICD-10-CM | POA: Diagnosis not present

## 2021-03-07 DIAGNOSIS — I70211 Atherosclerosis of native arteries of extremities with intermittent claudication, right leg: Secondary | ICD-10-CM | POA: Insufficient documentation

## 2021-03-07 DIAGNOSIS — I97638 Postprocedural hematoma of a circulatory system organ or structure following other circulatory system procedure: Secondary | ICD-10-CM

## 2021-03-07 DIAGNOSIS — F1290 Cannabis use, unspecified, uncomplicated: Secondary | ICD-10-CM | POA: Insufficient documentation

## 2021-03-07 DIAGNOSIS — I480 Paroxysmal atrial fibrillation: Secondary | ICD-10-CM | POA: Insufficient documentation

## 2021-03-07 DIAGNOSIS — I97618 Postprocedural hemorrhage and hematoma of a circulatory system organ or structure following other circulatory system procedure: Principal | ICD-10-CM | POA: Insufficient documentation

## 2021-03-07 DIAGNOSIS — I5022 Chronic systolic (congestive) heart failure: Secondary | ICD-10-CM | POA: Insufficient documentation

## 2021-03-07 DIAGNOSIS — G629 Polyneuropathy, unspecified: Secondary | ICD-10-CM | POA: Diagnosis not present

## 2021-03-07 DIAGNOSIS — Z7902 Long term (current) use of antithrombotics/antiplatelets: Secondary | ICD-10-CM | POA: Diagnosis not present

## 2021-03-07 DIAGNOSIS — I4819 Other persistent atrial fibrillation: Secondary | ICD-10-CM | POA: Insufficient documentation

## 2021-03-07 DIAGNOSIS — Z7901 Long term (current) use of anticoagulants: Secondary | ICD-10-CM | POA: Insufficient documentation

## 2021-03-07 DIAGNOSIS — Z7982 Long term (current) use of aspirin: Secondary | ICD-10-CM | POA: Insufficient documentation

## 2021-03-07 DIAGNOSIS — E785 Hyperlipidemia, unspecified: Secondary | ICD-10-CM | POA: Insufficient documentation

## 2021-03-07 DIAGNOSIS — Z8249 Family history of ischemic heart disease and other diseases of the circulatory system: Secondary | ICD-10-CM | POA: Insufficient documentation

## 2021-03-07 DIAGNOSIS — I70222 Atherosclerosis of native arteries of extremities with rest pain, left leg: Secondary | ICD-10-CM | POA: Insufficient documentation

## 2021-03-07 DIAGNOSIS — I5021 Acute systolic (congestive) heart failure: Secondary | ICD-10-CM | POA: Insufficient documentation

## 2021-03-07 DIAGNOSIS — I4892 Unspecified atrial flutter: Secondary | ICD-10-CM | POA: Insufficient documentation

## 2021-03-07 DIAGNOSIS — I97610 Postprocedural hemorrhage and hematoma of a circulatory system organ or structure following a cardiac catheterization: Secondary | ICD-10-CM | POA: Insufficient documentation

## 2021-03-07 DIAGNOSIS — F17210 Nicotine dependence, cigarettes, uncomplicated: Secondary | ICD-10-CM | POA: Insufficient documentation

## 2021-03-07 DIAGNOSIS — I739 Peripheral vascular disease, unspecified: Secondary | ICD-10-CM

## 2021-03-07 DIAGNOSIS — Z79899 Other long term (current) drug therapy: Secondary | ICD-10-CM | POA: Diagnosis not present

## 2021-03-07 HISTORY — PX: ABDOMINAL AORTOGRAM W/LOWER EXTREMITY: CATH118223

## 2021-03-07 HISTORY — PX: PERIPHERAL VASCULAR INTERVENTION: CATH118257

## 2021-03-07 LAB — POCT ACTIVATED CLOTTING TIME
Activated Clotting Time: 254 seconds
Activated Clotting Time: 312 seconds

## 2021-03-07 SURGERY — ABDOMINAL AORTOGRAM W/LOWER EXTREMITY
Anesthesia: LOCAL

## 2021-03-07 MED ORDER — FENTANYL CITRATE (PF) 100 MCG/2ML IJ SOLN
INTRAMUSCULAR | Status: DC | PRN
  Administered 2021-03-07 (×2): 50 ug via INTRAVENOUS

## 2021-03-07 MED ORDER — MIDAZOLAM HCL 2 MG/2ML IJ SOLN
INTRAMUSCULAR | Status: AC
  Filled 2021-03-07: qty 2

## 2021-03-07 MED ORDER — CLOPIDOGREL BISULFATE 75 MG PO TABS: 75.0000 mg | ORAL_TABLET | Freq: Every day | ORAL | 6 refills | Status: DC

## 2021-03-07 MED ORDER — FENTANYL CITRATE (PF) 100 MCG/2ML IJ SOLN
INTRAMUSCULAR | Status: AC
  Filled 2021-03-07: qty 2

## 2021-03-07 MED ORDER — SODIUM CHLORIDE 0.9% FLUSH: 3.0000 mL | INTRAVENOUS | Status: DC | PRN

## 2021-03-07 MED ORDER — HEPARIN (PORCINE) IN NACL 1000-0.9 UT/500ML-% IV SOLN
INTRAVENOUS | Status: DC | PRN
Start: 2021-03-07 — End: 2021-03-07
  Administered 2021-03-07: 500 mL

## 2021-03-07 MED ORDER — HEPARIN SODIUM (PORCINE) 1000 UNIT/ML IJ SOLN
INTRAMUSCULAR | Status: AC
  Filled 2021-03-07: qty 1

## 2021-03-07 MED ORDER — ONDANSETRON HCL 4 MG/2ML IJ SOLN: 4.0000 mg | Freq: Four times a day (QID) | INTRAMUSCULAR | Status: DC | PRN

## 2021-03-07 MED ORDER — SODIUM CHLORIDE 0.9% FLUSH: 3.0000 mL | Freq: Two times a day (BID) | INTRAVENOUS | Status: DC

## 2021-03-07 MED ORDER — HYDRALAZINE HCL 20 MG/ML IJ SOLN
INTRAMUSCULAR | Status: DC | PRN
  Administered 2021-03-07: 10 mg via INTRAVENOUS

## 2021-03-07 MED ORDER — SODIUM CHLORIDE 0.9 % IV SOLN: INTRAVENOUS | Status: DC

## 2021-03-07 MED ORDER — SODIUM CHLORIDE 0.9 % IV SOLN: 250.0000 mL | INTRAVENOUS | Status: DC | PRN

## 2021-03-07 MED ORDER — LIDOCAINE HCL (PF) 1 % IJ SOLN
INTRAMUSCULAR | Status: DC | PRN
  Administered 2021-03-07: 20 mL

## 2021-03-07 MED ORDER — MIDAZOLAM HCL 2 MG/2ML IJ SOLN
INTRAMUSCULAR | Status: DC | PRN
  Administered 2021-03-07: 1 mg via INTRAVENOUS

## 2021-03-07 MED ORDER — HYDRALAZINE HCL 20 MG/ML IJ SOLN
INTRAMUSCULAR | Status: AC
  Filled 2021-03-07: qty 1

## 2021-03-07 MED ORDER — LIDOCAINE HCL (PF) 1 % IJ SOLN
INTRAMUSCULAR | Status: AC
  Filled 2021-03-07: qty 30

## 2021-03-07 MED ORDER — ACETAMINOPHEN 325 MG PO TABS: 650.0000 mg | ORAL_TABLET | ORAL | Status: DC | PRN

## 2021-03-07 MED ORDER — IODIXANOL 320 MG/ML IV SOLN
INTRAVENOUS | Status: DC | PRN
  Administered 2021-03-07: 170 mL via INTRA_ARTERIAL

## 2021-03-07 MED ORDER — CLOPIDOGREL BISULFATE 75 MG PO TABS
75.0000 mg | ORAL_TABLET | Freq: Every day | ORAL | 6 refills | Status: AC
  Filled 2021-03-07: qty 30, 30d supply, fill #0

## 2021-03-07 MED ORDER — HYDRALAZINE HCL 20 MG/ML IJ SOLN
5.0000 mg | INTRAMUSCULAR | Status: DC | PRN
Start: 2021-03-07 — End: 2021-03-07
  Administered 2021-03-07: 5 mg via INTRAVENOUS
  Filled 2021-03-07: qty 1

## 2021-03-07 MED ORDER — HEPARIN (PORCINE) IN NACL 1000-0.9 UT/500ML-% IV SOLN
INTRAVENOUS | Status: AC
  Filled 2021-03-07: qty 1000

## 2021-03-07 MED ORDER — ASPIRIN 81 MG PO CHEW
81.0000 mg | CHEWABLE_TABLET | ORAL | Status: DC
Start: 2021-03-08 — End: 2021-03-07

## 2021-03-07 MED ORDER — HEPARIN SODIUM (PORCINE) 1000 UNIT/ML IJ SOLN
INTRAMUSCULAR | Status: DC | PRN
  Administered 2021-03-07: 8000 [IU] via INTRAVENOUS

## 2021-03-07 MED ORDER — CLOPIDOGREL BISULFATE 300 MG PO TABS
ORAL_TABLET | ORAL | Status: AC
  Filled 2021-03-07: qty 1

## 2021-03-07 MED ORDER — CLOPIDOGREL BISULFATE 300 MG PO TABS
ORAL_TABLET | ORAL | Status: DC | PRN
  Administered 2021-03-07: 300 mg via ORAL

## 2021-03-07 SURGICAL SUPPLY — 34 items
BALLN JADE .018 4.0 X 150 (BALLOONS) ×3
BALLN JADE .018 5.0 X 150 (BALLOONS) ×3
BALLOON JADE .018 4.0 X 150 (BALLOONS) ×2 IMPLANT
BALLOON JADE .018 5.0 X 150 (BALLOONS) ×2 IMPLANT
CATH ANGIO 5F PIGTAIL 65CM (CATHETERS) ×3 IMPLANT
CATH CROSS OVER TEMPO 5F (CATHETERS) ×3 IMPLANT
CATH CXI SUPP 2.6F 150 ANG (CATHETERS) ×3 IMPLANT
CATH QUICKCROSS .018X135CM (MICROCATHETER) ×3 IMPLANT
CATH QUICKCROSS SUPP .018X90CM (MICROCATHETER) IMPLANT
CATH STRAIGHT 5FR 65CM (CATHETERS) ×3 IMPLANT
DEVICE CLOSURE MYNXGRIP 6/7F (Vascular Products) ×3 IMPLANT
DEVICE TORQUE .014-.018 (MISCELLANEOUS) ×2 IMPLANT
DEVICE TORQUE .025-.038 (MISCELLANEOUS) ×3 IMPLANT
GUIDEWIRE ZILIENT 12G 018 (WIRE) ×3 IMPLANT
GUIDEWIRE ZILIENT 30G 018 (WIRE) ×3 IMPLANT
GUIDEWIRE ZILIENT 6G 018 (WIRE) ×3 IMPLANT
KIT ENCORE 26 ADVANTAGE (KITS) ×3 IMPLANT
KIT MICROPUNCTURE NIT STIFF (SHEATH) ×3 IMPLANT
KIT PV (KITS) ×3 IMPLANT
SHEATH PINNACLE 5F 10CM (SHEATH) ×3 IMPLANT
SHEATH PINNACLE 6F 10CM (SHEATH) ×3 IMPLANT
SHEATH PINNACLE ST 6F 65CM (SHEATH) ×3 IMPLANT
SHEATH PROBE COVER 6X72 (BAG) ×3 IMPLANT
STENT BIOMIMICS 6X150 (Permanent Stent) ×3 IMPLANT
STENT BIOMIMICS 6X60 (Permanent Stent) ×3 IMPLANT
STOPCOCK MORSE 400PSI 3WAY (MISCELLANEOUS) ×3 IMPLANT
SYR MEDRAD MARK 7 150ML (SYRINGE) ×3 IMPLANT
TAPE VIPERTRACK RADIOPAQ (MISCELLANEOUS) ×2 IMPLANT
TAPE VIPERTRACK RADIOPAQUE (MISCELLANEOUS) ×3
TORQUE DEVICE .014-.018 (MISCELLANEOUS) ×3
TRANSDUCER W/STOPCOCK (MISCELLANEOUS) ×3 IMPLANT
TRAY PV CATH (CUSTOM PROCEDURE TRAY) ×3 IMPLANT
TUBING CIL FLEX 10 FLL-RA (TUBING) ×3 IMPLANT
WIRE HITORQ VERSACORE ST 145CM (WIRE) ×3 IMPLANT

## 2021-03-07 NOTE — ED Triage Notes (Signed)
Pt here via [facility] from home. Pt had a stent placed in his L leg today, pt took a nap and woke up and noticed the bandage was saturated w/ blood. Pt was on eliquis, stopped it on Monday, was supposed to start plavix tomorrow.

## 2021-03-07 NOTE — Interval H&P Note (Signed)
History and Physical Interval Note:  03/07/2021 11:01 AM  Christopher Gutierrez  has presented today for surgery, with the diagnosis of peripheral vascular disease.  The various methods of treatment have been discussed with the patient and family. After consideration of risks, benefits and other options for treatment, the patient has consented to  Procedure(s): ABDOMINAL AORTOGRAM W/LOWER EXTREMITY (N/A) as a surgical intervention.  The patient's history has been reviewed, patient examined, no change in status, stable for surgery.  I have reviewed the patient's chart and labs.  Questions were answered to the patient's satisfaction.     Kathlyn Sacramento

## 2021-03-07 NOTE — ED Notes (Signed)
Femoral pressure belt applied to pt's right femoral artery insertion site by cardiac ICU RN. Right distal dorsalis pulse present and strong. Bleeding controlled. Will continue to monitor pt.

## 2021-03-07 NOTE — ED Provider Notes (Signed)
Emergency Medicine Provider Triage Evaluation Note  Christopher Gutierrez , a 60 y.o. male  was evaluated in triage.  Pt complains of postoperative bleeding.  The patient had an abdominal aortogram with lower extremity with Dr. Fletcher Anon with cardiology earlier today.  He had a stent placed in his left leg.  Since the procedure, he has not bled through the bandage placed in his right femoral region.  Bleeding was going down the leg earlier and has persisted, but has started to slow.  He is on Eliquis, which was stopped on Monday.  He is supposed to start taking Plavix tomorrow.  He spoke with his cardiology team earlier today who advised him to come to the ER for further evaluation.  No dizziness, lightheadedness, chest pain, shortness of breath.  Review of Systems  Positive: Bleeding, wound Negative: Fever, chills, abdominal pain, vomiting, chest pain, shortness of breath, dizziness, lightheadedness  Physical Exam  BP (!) 137/104   Pulse 100   Temp 98.3 F (36.8 C) (Oral)   Resp 20   Ht 6' (1.829 m)   Wt 88.4 kg   SpO2 99%   BMI 26.43 kg/m  Gen:   Awake, no distress   Resp:  Normal effort  MSK:   Moves extremities without difficulty  Other:  Bloodsoaked gauze and dressing and Tegaderm noted to the right femoral region.  There is blood noted to the right thigh.  No obvious bruising noted around the bandage.  Medical Decision Making  Medically screening exam initiated at 10:08 PM.  Appropriate orders placed.  Rip Schoenbauer was informed that the remainder of the evaluation will be completed by another provider, this initial triage assessment does not replace that evaluation, and the importance of remaining in the ED until their evaluation is complete.  60 year old male who underwent stent placement after having an abdominal aortogram with lower extremity today with cardiology.  Since the procedure, the patient has not bled through his bandage in the right femoral region.  We will leave the patient  supine in triage since there are long weights due to boarding.  We will have the patient apply pressure to the right femoral region.  Call has been placed to the cardiology fellow for further recommendations.  He is currently laying supine in a recliner applying pressure to the right femoral region.  Spoke with Dr. Conley Canal with cardiology.  Coordinated bed placement with charge RN.  He will see the patient when he is roomed in the ED.  Patient remained stable at this time.  He will require further work-up and evaluation in the emergency department.   Joanne Gavel, PA-C 03/07/21 2240    Drenda Freeze, MD 03/07/21 [phone]

## 2021-03-07 NOTE — ED Provider Notes (Signed)
[facility] EMERGENCY DEPARTMENT Provider Note   CSN: [id number] Arrival date & time: 03/07/21  2124     History Chief Complaint  Patient presents with   Post-op Problem    Christopher Gutierrez is a 60 y.o. male.  60 year old male complains of postoperative bleeding.  The patient had an abdominal aortogram with lower extremity with Dr. Fletcher Anon with cardiology earlier today.  He had a stent placed in his left leg with right femoral access.  Since the procedure, he has not bled through the bandage placed in his right femoral region.  Bleeding was going down the leg earlier and has persisted, but has started to slow.  He is on Eliquis, which was stopped on Sunday.  He is supposed to start taking Plavix tomorrow.  He spoke with his cardiology team earlier today who advised him to come to the ER for further evaluation.  No dizziness, lightheadedness, chest pain, shortness of breath.      Past Medical History:  Diagnosis Date   Atrial fibrillation ([place]) 10/24/2020   Atrial fibrillation and flutter ([place])    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   Hypertension     Patient Active Problem List   Diagnosis Date Noted   Acute systolic CHF (congestive heart failure) ([place]) 11/01/2020   Hyperlipidemia 11/01/2020   Alcohol abuse 11/01/2020   Atrial fibrillation with RVR ([place]) 10/30/2020   Pain in left testicle 10/17/2020   Atrial flutter ([place]) 10/12/2020   Peripheral neuropathy 0000000   Uncomplicated alcohol dependence ([place]) 10/12/2020   Lumbar spondylosis 06/16/2020   Tobacco use disorder 09/29/2017   Essential hypertension 10/01/2014    Past Surgical History:  Procedure Laterality Date   ABDOMINAL AORTOGRAM W/LOWER EXTREMITY N/A 03/07/2021   Procedure: ABDOMINAL AORTOGRAM W/LOWER EXTREMITY;  Surgeon: Wellington Hampshire, MD;  Location: [place] CV LAB;  Service: Cardiovascular;  Laterality: N/A;   CARDIOVERSION N/A 11/01/2020   Procedure:  CARDIOVERSION;  Surgeon: Jerline Pain, MD;  Location: [facility] ENDOSCOPY;  Service: Cardiovascular;  Laterality: N/A;   CATARACT EXTRACTION Right    MOUTH SURGERY  2013   PERIPHERAL VASCULAR INTERVENTION Left 03/07/2021   Procedure: PERIPHERAL VASCULAR INTERVENTION;  Surgeon: Wellington Hampshire, MD;  Location: [place] [facility] LAB;  Service: Cardiovascular;  Laterality: Left;  left SFA   TEE WITHOUT CARDIOVERSION N/A 11/01/2020   Procedure: TRANSESOPHAGEAL ECHOCARDIOGRAM (TEE);  Surgeon: Jerline Pain, MD;  Location: [facility] ENDOSCOPY;  Service: Cardiovascular;  Laterality: N/A;       Family History  Problem Relation Age of Onset   Gout Brother    Hypertension Brother    COPD Maternal Uncle    Stroke Paternal Aunt    Birth defects Paternal Aunt     Social History   Tobacco Use   Smoking status: Some Days    Packs/day: 1.50    Years: 25.00    Pack years: 37.50    Types: Cigarettes   Smokeless tobacco: Current    Types: Chew  Vaping Use   Vaping Use: Never used  Substance Use Topics   Alcohol use: Yes    Alcohol/week: 24.0 standard drinks    Types: 24 Cans of beer per week    Comment: 6 cans every other day   Drug use: Yes    Frequency: 2.0 times per week    Types: Marijuana    Comment: he took Marijuana 2 weeks ago    Home Medications Prior to  Admission medications   Medication Sig Start Date End Date Taking? Authorizing Provider  acetaminophen (TYLENOL) 325 MG tablet Take 650 mg by mouth every 6 (six) hours as needed for moderate pain or headache.    [provider]  apixaban (ELIQUIS) 5 MG TABS tablet Take 1 tablet (5 mg total) by mouth 2 (two) times daily. 11/02/20   Duke, Tami Lin, PA  bisoprolol (ZEBETA) 5 MG tablet Take 0.5 tablets (2.5 mg total) by mouth daily. 12/11/20   Bensimhon, Shaune Pascal, MD  Carboxymethylcellul-Glycerin (LUBRICATING EYE DROPS OP) Place 1 drop into both eyes daily as needed (dry eyes).    [provider]  cetirizine (ZYRTEC) 10 MG  tablet Take 10 mg by mouth as needed for allergies.    [provider]  clopidogrel (PLAVIX) 75 MG tablet Take 1 tablet (75 mg total) by mouth daily. 03/07/21 10/03/21  Cheryln Manly, NP  Famotidine-Ca Carb-Mag Hydrox (PEPCID COMPLETE PO) Take 1 tablet by mouth daily as needed (acid reflux).    [provider]  Nicotine 10 MG/ML SOLN 1-2 puffs as needed 01/26/21   Bensimhon, Shaune Pascal, MD  nicotine polacrilex (COMMIT) 4 MG lozenge Take 4 mg by mouth as needed for smoking cessation.    [provider]  rosuvastatin (CRESTOR) 20 MG tablet Take 1 tablet (20 mg total) by mouth daily. 01/26/21   Bensimhon, Shaune Pascal, MD  sacubitril-valsartan (ENTRESTO) 97-103 MG Take 1 tablet by mouth 2 (two) times daily. 01/26/21   Bensimhon, Shaune Pascal, MD  spironolactone (ALDACTONE) 25 MG tablet Take 0.5 tablets (12.5 mg total) by mouth daily. 11/28/20   Bensimhon, Shaune Pascal, MD    Allergies    Beta adrenergic blockers and Tape  Review of Systems   Review of Systems  Constitutional:  Negative for fever.  Respiratory:  Negative for shortness of breath.   Cardiovascular:  Negative for chest pain.  Gastrointestinal:  Negative for abdominal pain, nausea and vomiting.  Musculoskeletal:  Negative for arthralgias and myalgias.  Skin:  Positive for wound.  Allergic/Immunologic: Negative for immunocompromised state.  Neurological:  Negative for dizziness, weakness and numbness.  Hematological:  Negative for adenopathy.  Psychiatric/Behavioral:  Negative for confusion.   All other systems reviewed and are negative.  Physical Exam Updated Vital Signs BP (!) 137/104   Pulse 100   Temp 98.3 F (36.8 C) (Oral)   Resp 20   Ht 6' (1.829 m)   Wt 88.4 kg   SpO2 99%   BMI 26.43 kg/m   Physical Exam Vitals and nursing note reviewed.  Constitutional:      General: He is not in acute distress.    Appearance: He is well-developed. He is not diaphoretic.  HENT:     Head: Normocephalic and  atraumatic.  Cardiovascular:     Pulses: Normal pulses.  Pulmonary:     Effort: Pulmonary effort is normal.  Abdominal:     Palpations: Abdomen is soft.     Tenderness: There is no abdominal tenderness.  Musculoskeletal:     Right lower leg: No edema.     Left lower leg: No edema.  Skin:    General: Skin is warm and dry.     Findings: No erythema.     Comments: Right femoral access site with blood soaked dressing  Neurological:     Mental Status: He is alert and oriented to person, place, and time.     Sensory: No sensory deficit.     Motor: No weakness.  Psychiatric:        Behavior: Behavior normal.    ED Results / Procedures / Treatments   Labs (all labs ordered are listed, but only abnormal results are displayed) Labs Reviewed  RESP PANEL BY RT-PCR (FLU A&B, COVID) ARPGX2  BASIC METABOLIC PANEL  CBC WITH DIFFERENTIAL/PLATELET    EKG None  Radiology PERIPHERAL VASCULAR CATHETERIZATION  Result Date: 03/07/2021 1.  No significant aortoiliac disease. 2.  Left lower extremity: Occluded distal SFA into the proximal popliteal artery with two-vessel runoff below the knee.  Occluded posterior tibial artery. 3.  Right lower extremity: Severe focal stenosis in the distal SFA 4.  Successful angioplasty and 2 overlapped stents to the left distal SFA into the proximal popliteal artery Recommendations: Resume Eliquis tomorrow if no bleeding issues from the right groin. Use Plavix for a minimal of 1 month but preferably for 6 months if tolerated.  No aspirin to minimize the risk of bleeding given that he is on anticoagulation. The patient will require staged intervention on the severe disease in the right SFA.    Procedures Procedures   Medications Ordered in ED Medications - No data to display  ED Course  I have reviewed the triage vital signs and the nursing notes.  Pertinent labs & imaging results that were available during my care of the patient were reviewed by me and  considered in my medical decision making (see chart for details).  Clinical Course as of 03/07/21 2331  Wed Mar 07, 1930  [id number] 60 year old male presents with bleeding from right femoral access site after stent placement and left leg today.  Patient is found of a blood soaked dressing, Dr. Conley Canal with cardiology at bedside for evaluation.  Dressing was removed, mild oozing of blood from site which is controlled with pressure.  Plan is for Dr. Elisabeth Cara to admit overnight for observation, pressure device placed.  Admission labs pending. [LM]    Clinical Course User Index [LM] Roque Lias   MDM Rules/Calculators/A&P                           Final Clinical Impression(s) / ED Diagnoses Final diagnoses:  Postoperative hematoma involving circulatory system following other circulatory system procedure    Rx / DC Orders ED Discharge Orders     None        Tacy Learn, PA-C 03/07/21 2332    Drenda Freeze, MD 03/09/21 [phone]

## 2021-03-08 ENCOUNTER — Observation Stay (HOSPITAL_BASED_OUTPATIENT_CLINIC_OR_DEPARTMENT_OTHER): Payer: 59

## 2021-03-08 DIAGNOSIS — I724 Aneurysm of artery of lower extremity: Secondary | ICD-10-CM

## 2021-03-08 DIAGNOSIS — R58 Hemorrhage, not elsewhere classified: Secondary | ICD-10-CM | POA: Diagnosis present

## 2021-03-08 DIAGNOSIS — I4891 Unspecified atrial fibrillation: Secondary | ICD-10-CM | POA: Diagnosis not present

## 2021-03-08 DIAGNOSIS — M7981 Nontraumatic hematoma of soft tissue: Secondary | ICD-10-CM

## 2021-03-08 LAB — BASIC METABOLIC PANEL
Anion gap: 10 (ref 5–15)
BUN: 11 mg/dL (ref 6–20)
CO2: 21 mmol/L — ABNORMAL LOW (ref 22–32)
Calcium: 9.2 mg/dL (ref 8.9–10.3)
Chloride: 103 mmol/L (ref 98–111)
Creatinine, Ser: 1.06 mg/dL (ref 0.61–1.24)
GFR, Estimated: >60 mL/min (ref >60)
Glucose, Bld: 111 mg/dL — ABNORMAL HIGH (ref 70–99)
Potassium: 3.7 mmol/L (ref 3.5–5.1)
Sodium: 134 mmol/L — ABNORMAL LOW (ref 135–145)

## 2021-03-08 LAB — CBC WITH DIFFERENTIAL/PLATELET
Abs Immature Granulocytes: 0.03 10*3/uL (ref 0.00–0.07)
Basophils Absolute: 0 10*3/uL (ref 0.0–0.1)
Basophils Relative: 0 %
Eosinophils Absolute: 0 10*3/uL (ref 0.0–0.5)
Eosinophils Relative: 0 %
HCT: 51.9 % (ref 39.0–52.0)
Hemoglobin: 17.7 g/dL — ABNORMAL HIGH (ref 13.0–17.0)
Immature Granulocytes: 0 %
Lymphocytes Relative: 11 %
Lymphs Abs: 1 10*3/uL (ref 0.7–4.0)
MCH: 34.9 pg — ABNORMAL HIGH (ref 26.0–34.0)
MCHC: 34.1 g/dL (ref 30.0–36.0)
MCV: 102.4 fL — ABNORMAL HIGH (ref 80.0–100.0)
Monocytes Absolute: 0.6 10*3/uL (ref 0.1–1.0)
Monocytes Relative: 6 %
Neutro Abs: 7.6 10*3/uL (ref 1.7–7.7)
Neutrophils Relative %: 83 %
Platelets: 148 10*3/uL — ABNORMAL LOW (ref 150–400)
RBC: 5.07 MIL/uL (ref 4.22–5.81)
RDW: 11.7 % (ref 11.5–15.5)
WBC: 9.3 10*3/uL (ref 4.0–10.5)
nRBC: 0 % (ref 0.0–0.2)

## 2021-03-08 LAB — CBC
HCT: 48.4 % (ref 39.0–52.0)
Hemoglobin: 16.9 g/dL (ref 13.0–17.0)
MCH: 35 pg — ABNORMAL HIGH (ref 26.0–34.0)
MCHC: 34.9 g/dL (ref 30.0–36.0)
MCV: 100.2 fL — ABNORMAL HIGH (ref 80.0–100.0)
Platelets: 130 10*3/uL — ABNORMAL LOW (ref 150–400)
RBC: 4.83 MIL/uL (ref 4.22–5.81)
RDW: 11.6 % (ref 11.5–15.5)
WBC: 8.9 10*3/uL (ref 4.0–10.5)
nRBC: 0 % (ref 0.0–0.2)

## 2021-03-08 LAB — HIV ANTIBODY (ROUTINE TESTING W REFLEX): HIV Screen 4th Generation wRfx: NONREACTIVE

## 2021-03-08 LAB — RESP PANEL BY RT-PCR (FLU A&B, COVID) ARPGX2
Influenza A by PCR: NEGATIVE
Influenza B by PCR: NEGATIVE
SARS Coronavirus 2 by RT PCR: NEGATIVE

## 2021-03-08 MED ORDER — SACUBITRIL-VALSARTAN 97-103 MG PO TABS: 1.0000 | ORAL_TABLET | Freq: Two times a day (BID) | ORAL | Status: DC

## 2021-03-08 MED ORDER — ACETAMINOPHEN 500 MG PO TABS
1000.0000 mg | ORAL_TABLET | Freq: Three times a day (TID) | ORAL | Status: DC | PRN
  Filled 2021-03-08: qty 2

## 2021-03-08 MED ORDER — ROSUVASTATIN CALCIUM 20 MG PO TABS
20.0000 mg | ORAL_TABLET | Freq: Every day | ORAL | Status: DC
  Filled 2021-03-08: qty 1

## 2021-03-08 MED ORDER — SPIRONOLACTONE 12.5 MG HALF TABLET: 12.5000 mg | ORAL_TABLET | Freq: Every day | ORAL | Status: DC

## 2021-03-08 MED ORDER — OXYCODONE HCL 5 MG PO TABS
5.0000 mg | ORAL_TABLET | Freq: Once | ORAL | Status: AC | PRN
Start: 2021-03-08 — End: 2021-03-08
  Administered 2021-03-08: 5 mg via ORAL
  Filled 2021-03-08: qty 1

## 2021-03-08 MED FILL — Heparin Sod (Porcine)-NaCl IV Soln 1000 Unit/500ML-0.9%: INTRAVENOUS | Qty: 500 | Status: AC

## 2021-03-08 NOTE — Progress Notes (Signed)
Limited right lower extremity arterial duplex (pseudoaneurysm evaluation) completed. Refer to "CV Proc" under chart review to view preliminary results.  Preliminary results discussed with Dr. Gwenlyn Found.  03/08/2021 10:08 AM Kelby Aline., MHA, RVT, RDCS, RDMS

## 2021-03-08 NOTE — ED Notes (Addendum)
Released pressure on femoral device, doralis pedis pulse present. Pt verbalized discomfort. Pt provided with warm blankets and blankets for pillow. Refused pillow. Refused meal d/t being in the hallway. Cardiologist at bedside informed pt importance to continue to maintain device on for approx another hour. Pt verbalized understanding. Per cardiologist orders for pain medications to be placed.

## 2021-03-08 NOTE — ED Notes (Signed)
Patient repeatedly removing monitoring cords despite RN education on importance of continuous cardiac, blood pressure, and spo2 monitoring.

## 2021-03-08 NOTE — ED Notes (Signed)
Patient upset that tylenol was ordered for pain. States he wants something stronger and something to help him sleep. Will notify provider.

## 2021-03-08 NOTE — ED Notes (Signed)
Discussed pt with Charge RN [place]. Informed pt is a SDU currently in the hallway. Pending move to room.

## 2021-03-08 NOTE — H&P (Signed)
Cardiology Admission History and Physical:   Patient ID: Christopher Gutierrez MRN: [id number]; DOB: 25-Nov-1960   Admission date: 03/07/2021  PCP:  [name], [place] Prov[facility] Cardiologist:  [name], MD        Chief Complaint:  groin bleeding  Patient Profile:   Christopher Gutierrez is a 60 y.o. male with atrial fibrillation on Eliquis, HTN, HLD, tobacco abuse, alcohol use and PAD who is being seen 03/08/2021 for the evaluation of groin bleeding.  History of Present Illness:   Christopher Gutierrez is a 60 y.o. male with atrial fibrillation on Eliquis, HTN, HLD, tobacco abuse, alcohol use and PAD who is being seen 03/08/2021 for the evaluation of groin bleeding.  Patient underwent catheterization for peripheral vascular disease yesterday involving access in the right femoral artery with stents placed in the left lower extremity.  The procedure was without complications and he was discharged home.  After waking from a nap, he noticed that the gauze in his right groin was saturated with blood.  He also have pain at the access site.  He denies chest pain, shortness of breath, nausea, vomiting, diarrhea, abdominal pain, palpitations, syncope, presyncope, weakness or numbness.  Given his symptoms he presented to the emergency room for evaluation.  In the ED he was hemodynamically stable.  Labs were unremarkable.   Past Medical History:  Diagnosis Date   Atrial fibrillation ([place]) 10/24/2020   Atrial fibrillation and flutter ([place])    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   Hypertension     Past Surgical History:  Procedure Laterality Date   ABDOMINAL AORTOGRAM W/LOWER EXTREMITY N/A 03/07/2021   Procedure: ABDOMINAL AORTOGRAM W/LOWER EXTREMITY;  Surgeon: Wellington Hampshire, MD;  Location: [facility];  Service: Cardiovascular;  Laterality: N/A;   CARDIOVERSION N/A 11/01/2020   Procedure: CARDIOVERSION;  Surgeon: Jerline Pain, MD;  Location: [facility]  ENDOSCOPY;  Service: Cardiovascular;  Laterality: N/A;   CATARACT EXTRACTION Right    MOUTH SURGERY  2013   PERIPHERAL VASCULAR INTERVENTION Left 03/07/2021   Procedure: PERIPHERAL VASCULAR INTERVENTION;  Surgeon: Wellington Hampshire, MD;  Location: [place] LAB;  Service: Cardiovascular;  Laterality: Left;  left SFA   TEE WITHOUT CARDIOVERSION N/A 11/01/2020   Procedure: TRANSESOPHAGEAL ECHOCARDIOGRAM (TEE);  Surgeon: Jerline Pain, MD;  Location: [facility] ENDOSCOPY;  Service: Cardiovascular;  Laterality: N/A;     Medications Prior to Admission: Prior to Admission medications   Medication Sig Start Date End Date Taking? Authorizing Provider  acetaminophen (TYLENOL) 325 MG tablet Take 650 mg by mouth every 6 (six) hours as needed for moderate pain or headache.    [provider]  apixaban (ELIQUIS) 5 MG TABS tablet Take 1 tablet (5 mg total) by mouth 2 (two) times daily. 11/02/20   Duke, Tami Lin, PA  bisoprolol (ZEBETA) 5 MG tablet Take 0.5 tablets (2.5 mg total) by mouth daily. 12/11/20   Bensimhon, Shaune Pascal, MD  Carboxymethylcellul-Glycerin (LUBRICATING EYE DROPS OP) Place 1 drop into both eyes daily as needed (dry eyes).    [provider]  cetirizine (ZYRTEC) 10 MG tablet Take 10 mg by mouth as needed for allergies.    [provider]  clopidogrel (PLAVIX) 75 MG tablet Take 1 tablet (75 mg total) by mouth daily. 03/07/21 10/03/21  Cheryln Manly, NP  Famotidine-Ca Carb-Mag Hydrox (PEPCID COMPLETE PO) Take 1 tablet by mouth daily as needed (acid reflux).    [provider]  Nicotine 10 MG/ML SOLN 1-2 puffs as needed 01/26/21   Bensimhon, Shaune Pascal, MD  nicotine polacrilex (COMMIT) 4 MG lozenge Take 4 mg by mouth as needed for smoking cessation.    [provider]  rosuvastatin (CRESTOR) 20 MG tablet Take 1 tablet (20 mg total) by mouth daily. 01/26/21   Bensimhon, Shaune Pascal, MD  sacubitril-valsartan (ENTRESTO) 97-103 MG Take 1 tablet by mouth 2 (two)  times daily. 01/26/21   Bensimhon, Shaune Pascal, MD  spironolactone (ALDACTONE) 25 MG tablet Take 0.5 tablets (12.5 mg total) by mouth daily. 11/28/20   Bensimhon, Shaune Pascal, MD     Allergies:    Allergies  Allergen Reactions   Beta Adrenergic Blockers Other (See Comments)    Scrotal edema with metoprolol and carvedilol   Tape Rash    Plastic tape    Social History:   Social History   Socioeconomic History   Marital status: Divorced    Spouse name: Not on file   Number of children: 1   Years of education: Not on file   Highest education level: Master's degree (e.g., MA, MS, MEng, MEd, MSW, MBA)  Occupational History   Not on file  Tobacco Use   Smoking status: Some Days    Packs/day: 1.50    Years: 25.00    Pack years: 37.50    Types: Cigarettes   Smokeless tobacco: Current    Types: Chew  Vaping Use   Vaping Use: Never used  Substance and Sexual Activity   Alcohol use: Yes    Alcohol/week: 24.0 standard drinks    Types: 24 Cans of beer per week    Comment: 6 cans every other day   Drug use: Yes    Frequency: 2.0 times per week    Types: Marijuana    Comment: he took Marijuana 2 weeks ago   Sexual activity: Yes    Partners: Female  Other Topics Concern   Not on file  Social History Narrative   Not on file   Social Determinants of Health   Financial Resource Strain: Low Risk    Difficulty of Paying Living Expenses: Not hard at all  Food Insecurity: No Food Insecurity   Worried About Charity fundraiser in the Last Year: Never true   Arboriculturist in the Last Year: Never true  Transportation Needs: No Transportation Needs   Lack of Transportation (Medical): No   Lack of Transportation (Non-Medical): No  Physical Activity: Not on file  Stress: Not on file  Social Connections: Not on file  Intimate Partner Violence: Not on file    Family History:   The patient's family history includes Birth defects in his paternal aunt; COPD in his maternal uncle; Gout in his  brother; Hypertension in his brother; Stroke in his paternal aunt.    ROS:  Please see the history of present illness.  All other ROS reviewed and negative.     Physical Exam/Data:   Vitals:   03/08/21 0230 03/08/21 0300 03/08/21 0315 03/08/21 0426  BP: (!) 158/132 131/87 (!) 140/127 (!) 152/102  Pulse: 86 71 91 93  Resp: (!) '23 18 19 '$ (!) 25  Temp:      TempSrc:      SpO2: 99% 98% 97% 99%  Weight:      Height:       No intake or output data in the 24 hours ending 03/08/21 0518 Last 3 Weights 03/07/2021 03/07/2021 02/13/2021  Weight (lbs) 194  lb 14.2 oz 195 lb 197 lb  Weight (kg) 88.4 kg 88.451 kg 89.359 kg     Body mass index is 26.43 kg/m.  General:  Well nourished, well developed, in no acute distress HEENT: normal Neck: no JVD Vascular: FA pulses 2+ bilaterally, R groin with oozing and hematoma, unclear if bruit is present Cardiac:  normal S1, S2; RRR; no murmur  Lungs:  clear to auscultation bilaterally, no wheezing, rhonchi or rales  Abd: soft, nontender, no hepatomegaly  Ext: no edema Musculoskeletal:  No deformities, BUE and BLE strength normal and equal Skin: warm and dry  Neuro:  CNs 2-12 intact, no focal abnormalities noted Psych:  Normal affect    Relevant CV Studies:  Peripheral Vascular Catheterization 8/2:  1.  No significant aortoiliac disease. 2.  Left lower extremity: Occluded distal SFA into the proximal popliteal artery with two-vessel runoff below the knee.  Occluded posterior tibial artery. 3.  Right lower extremity: Severe focal stenosis in the distal SFA 4.  Successful angioplasty and 2 overlapped stents to the left distal SFA into the proximal popliteal artery   Recommendations: Resume Eliquis tomorrow if no bleeding issues from the right groin. Use Plavix for a minimal of 1 month but preferably for 6 months if tolerated.  No aspirin to minimize the risk of bleeding given that he is on anticoagulation. The patient will require staged intervention  on the severe disease in the right SFA.   TTE 01/26/21:  IMPRESSIONS     1. Compared with echo 123456, systolic function has improved.   2. Left ventricular ejection fraction, by estimation, is 45 to 50%. The  left ventricle has mildly decreased function. The left ventricle  demonstrates global hypokinesis. There is moderate concentric left  ventricular hypertrophy. Left ventricular  diastolic parameters are consistent with Grade I diastolic dysfunction  (impaired relaxation).   3. Right ventricular systolic function is normal. The right ventricular  size is normal.   4. The mitral valve is normal in structure. Trivial mitral valve  regurgitation. No evidence of mitral stenosis.   5. The aortic valve is tricuspid. Aortic valve regurgitation is not  visualized. No aortic stenosis is present.   6. The inferior vena cava is normal in size with greater than 50%  respiratory variability, suggesting right atrial pressure of 3 mmHg.   Laboratory Data:  High Sensitivity Troponin:  No results for input(s): TROPONINIHS in the last 720 hours.    Chemistry Recent Labs  Lab 03/07/21 2317  NA 134*  K 3.7  CL 103  CO2 21*  GLUCOSE 111*  BUN 11  CREATININE 1.06  CALCIUM 9.2  GFRNONAA >60  ANIONGAP 10    No results for input(s): PROT, ALBUMIN, AST, ALT, ALKPHOS, BILITOT in the last 168 hours. Hematology Recent Labs  Lab 03/07/21 2317  WBC 9.3  RBC 5.07  HGB 17.7*  HCT 51.9  MCV 102.4*  MCH 34.9*  MCHC 34.1  RDW 11.7  PLT 148*   BNPNo results for input(s): BNP, PROBNP in the last 168 hours.  DDimer No results for input(s): DDIMER in the last 168 hours.   Radiology/Studies:  PERIPHERAL VASCULAR CATHETERIZATION  Result Date: 03/07/2021 1.  No significant aortoiliac disease. 2.  Left lower extremity: Occluded distal SFA into the proximal popliteal artery with two-vessel runoff below the knee.  Occluded posterior tibial artery. 3.  Right lower extremity: Severe focal stenosis  in the distal SFA 4.  Successful angioplasty and 2 overlapped stents to the left  distal SFA into the proximal popliteal artery Recommendations: Resume Eliquis tomorrow if no bleeding issues from the right groin. Use Plavix for a minimal of 1 month but preferably for 6 months if tolerated.  No aspirin to minimize the risk of bleeding given that he is on anticoagulation. The patient will require staged intervention on the severe disease in the right SFA.     Assessment and Plan:   Daimion Balestrieri is a 60 y.o. male with atrial fibrillation on Eliquis, HTN, HLD, tobacco abuse, alcohol use and PAD who is being seen 03/08/2021 for the evaluation of groin bleeding.  #Groin Access Site Bleeding and Hematoma ::Access site with minimal oozing from femoral artery.  The patient is hemodynamically stable.  There appears to be a small hematoma that is palpable.  Cannot appreciate bruit; however, and not with [id number] certainty that there is a bruit.  Hemoglobin stable but will check 1 more in the morning.  We will get a right groin ultrasound just to rule out any pseudoaneurysm and to characterize the hematoma.  We will place a FemoStop to get good hemostasis.  Will admit to cardiology. -Hold apixaban and Plavix until bleeding stops -Please FemoStop for hemostasis -Groin ultrasound to assess for pseudoaneurysm  #Afib -Hold apixaban   Risk Assessment/Risk Scores:         CHA2DS2-VASc Score = 2  This indicates a 2.2% annual risk of stroke. The patient's score is based upon:      Severity of Illness: The appropriate patient status for this patient is OBSERVATION. Observation status is judged to be reasonable and necessary in order to provide the required intensity of service to ensure the patient's safety. The patient's presenting symptoms, physical exam findings, and initial radiographic and laboratory data in the context of their medical condition is felt to place them at decreased risk for further clinical  deterioration. Furthermore, it is anticipated that the patient will be medically stable for discharge from the hospital within 2 midnights of admission. The following factors support the patient status of observation.   " The patient's presenting symptoms include bleeding. " The physical exam findings include bleeding groin. " The initial radiographic and laboratory data are stable.   For questions or updates, please contact [place] Please consult [email or web address] for contact info under     Signed, Hershal Coria, MD  03/08/2021 5:18 AM

## 2021-03-08 NOTE — ED Notes (Signed)
PMS present in both lower extremities, right continues to be cooler than left. Bleeding controlled.

## 2021-03-08 NOTE — ED Notes (Signed)
Pt requesting to speak with his doctor and having multiple questions regarding his care. This Rn explained to pt his plan of care, pt unhappy. Requesting pain medication, refused PRN tylenol throughout the night. Admitting provider paged, awaiting response.

## 2021-03-08 NOTE — Discharge Summary (Addendum)
Discharge Summary    Patient ID: Christopher Gutierrez MRN: [id number]; DOB: 07-08-1961  Admit date: 03/07/2021 Discharge date: 03/08/2021  PCP:  [name], DO   [facility] Providers Cardiologist:  [name], MD   {    Discharge Diagnoses    Active Problems:   Bleeding from cath access site Paroxysmal atrial fibrillation Peripheral arterial disease Hypertension Tobacco abuse Hyperlipidemia    Diagnostic Studies/Procedures   Lower extremity arterial pseudoaneurysm Preliminary report: +------------+----------+---------+------+----------+  Right DuplexPSV (cm/s)Waveform PlaqueComment(s)  +------------+----------+---------+------+----------+  CFA             70    triphasic                  +------------+----------+---------+------+----------+  PFA             54    triphasic                  +------------+----------+---------+------+----------+  Prox SFA        63    triphasic                  +------------+----------+---------+------+----------+   Right Vein comments:Right CFV patent without evidence of DVT     Summary: No evidence of pseudoaneurysm, AVF or DVT    History of Present Illness     Christopher Gutierrez is a 60 y.o. male with atrial fibrillation on Eliquis, HTN, HLD, tobacco abuse, alcohol use and PAD who presented for the evaluation of groin bleeding.  Patient underwent catheterization for peripheral vascular disease yesterday involving access in the right femoral artery with stents placed in the left lower extremity.  The procedure was without complications and he was discharged home.  After waking from a nap, he noticed that the gauze in his right groin was saturated with blood.  He also have pain at the access site.  He denies chest pain, shortness of breath, nausea, vomiting, diarrhea, abdominal pain, palpitations, syncope, presyncope, weakness or numbness.  Given his symptoms he presented to the emergency room for  evaluation.   In the ED he was hemodynamically stable.  Labs were unremarkable.   Hospital Course     Consultants: None  Patient was noted to have small palpable hematoma on arrival with minimal oozing from access site at right femoral artery.  No bruit.  Patient was hemodynamically stable.  Hemoglobin stable. He was placed on FemoStop for 4-hours with achievement of good hemostasis.  Recurrent bleeding.  Patient underwent lower extremity arterial Doppler which was negative for pseudoaneurysm on preliminary study.  Final reading is pending.  Patient was felt stable to discharge per Dr. Gwenlyn Found.  He was instructed to start Eliquis tomorrow.  No driving for 72 hours.  The patient been seen by Dr. Gwenlyn Found today and deemed ready for discharge home. All follow-up appointments have been scheduled. Discharge medications are listed below.    Did the patient have an acute coronary syndrome (MI, NSTEMI, STEMI, etc) this admission?:  No                               Did the patient have a percutaneous coronary intervention (stent / angioplasty)?:  No.     Discharge Vitals Blood pressure (!) 180/116, pulse 86, temperature 98.3 F (36.8 C), temperature source Oral, resp. rate 19, height 6' (1.829 m), weight 88.4 kg, SpO2 100 %.  Filed Weights   03/07/21 2151  Weight: 88.4 kg  Physical Exam Constitutional:      Appearance: Normal appearance.  HENT:     Head: Normocephalic.  Eyes:     Pupils: Pupils are equal, round, and reactive to light.  Cardiovascular:     Rate and Rhythm: Normal rate and regular rhythm.     Pulses: Normal pulses.     Comments: Right groin cath site without hematoma Pulmonary:     Effort: Pulmonary effort is normal.     Breath sounds: Normal breath sounds.  Abdominal:     General: Abdomen is flat.     Palpations: Abdomen is soft.  Musculoskeletal:        General: Normal range of motion.     Cervical back: Normal range of motion.  Skin:    General: Skin is warm and dry.   Neurological:     General: No focal deficit present.     Mental Status: He is alert and oriented to person, place, and time.  Psychiatric:        Mood and Affect: Mood normal.        Behavior: Behavior normal.    Labs & Radiologic Studies    CBC Recent Labs    03/07/21 2317 03/08/21 0527  WBC 9.3 8.9  NEUTROABS 7.6  --   HGB 17.7* 16.9  HCT 51.9 48.4  MCV 102.4* 100.2*  PLT 148* [id number]*   Basic Metabolic Panel Recent Labs    03/07/21 2317  NA 134*  K 3.7  CL 103  CO2 21*  GLUCOSE 111*  BUN 11  CREATININE 1.06  CALCIUM 9.2    _____________  PERIPHERAL VASCULAR CATHETERIZATION  Result Date: 03/07/2021 1.  No significant aortoiliac disease. 2.  Left lower extremity: Occluded distal SFA into the proximal popliteal artery with two-vessel runoff below the knee.  Occluded posterior tibial artery. 3.  Right lower extremity: Severe focal stenosis in the distal SFA 4.  Successful angioplasty and 2 overlapped stents to the left distal SFA into the proximal popliteal artery Recommendations: Resume Eliquis tomorrow if no bleeding issues from the right groin. Use Plavix for a minimal of 1 month but preferably for 6 months if tolerated.  No aspirin to minimize the risk of bleeding given that he is on anticoagulation. The patient will require staged intervention on the severe disease in the right SFA.   VAS [place] GROIN PSEUDOANEURYSM  Result Date: 03/08/2021  ARTERIAL PSEUDOANEURYSM  Patient Name:  Christopher Gutierrez  Date of Exam:   03/08/2021 Medical Rec #: [id number]       Accession #:    [id number] Date of Birth: [birth year]       Patient Gender: M Patient Age:   59Y Exam Location:  [facility] Procedure:      VAS [place] GROIN PSEUDOANEURYSM Referring Phys: Hershal Coria --------------------------------------------------------------------------------  Exam: Right groin Indications: Patient complains of Right groin bleeding s/p catheterization 03/07/21. Comparison Study: No prior study Performing  Technologist: Maudry Mayhew MHA, RDMS, RVT, RDCS  Examination Guidelines: A complete evaluation includes B-mode imaging, spectral Doppler, color Doppler, and power Doppler as needed of all accessible portions of each vessel. Bilateral testing is considered an integral part of a complete examination. Limited examinations for reoccurring indications may be performed as noted. +------------+----------+---------+------+----------+ Right DuplexPSV (cm/s)Waveform PlaqueComment(s) +------------+----------+---------+------+----------+ CFA             70    triphasic                 +------------+----------+---------+------+----------+ PFA  54    triphasic                 +------------+----------+---------+------+----------+ Prox SFA        63    triphasic                 +------------+----------+---------+------+----------+ Right Vein comments:Right CFV patent without evidence of DVT  Summary: No evidence of pseudoaneurysm, AVF or DVT    --------------------------------------------------------------------------------    Preliminary    VAS [place] LOWER EXTREMITY ARTERIAL DUPLEX  Result Date: 02/08/2021 LOWER EXTREMITY ARTERIAL DUPLEX STUDY Patient Name:  Christopher Gutierrez  Date of Exam:   02/07/2021 Medical Rec #: [id number]       Accession #:    [id number] Date of Birth: 1960/09/17       Patient Gender: M Patient Age:   31Y Exam Location:  [facility] Procedure:      VAS [place] LOWER EXTREMITY ARTERIAL DUPLEX Referring Phys: [address] [place] --------------------------------------------------------------------------------  Indications: Claudication, peripheral artery disease, and Patient has had              bilateral leg pain after walking 5-10 minutes left > right for              about 1 year. The pain starts in the mid thigh region and goes down              to calves. He has noticed this has gotten worse since he was              diagnosed with atrial fibrillation in 10/2020. He denies  rest pain. High Risk Factors: Hypertension, hyperlipidemia, current smoker. Other Factors: SEE ABI REPORT.  Current ABI: 01/30/2021 Right .76 Left .11 Comparison Study: None Performing Technologist: Alecia Mackin RVT, RDCS (AE), RDMS  Examination Guidelines: A complete evaluation includes B-mode imaging, spectral Doppler, color Doppler, and power Doppler as needed of all accessible portions of each vessel. Bilateral testing is considered an integral part of a complete examination. Limited examinations for reoccurring indications may be performed as noted.  +-----------+--------+-----+---------------+-----------------+--------+ RIGHT      PSV cm/sRatioStenosis       Waveform         Comments +-----------+--------+-----+---------------+-----------------+--------+ CFA Prox   127                         barely triphasic          +-----------+--------+-----+---------------+-----------------+--------+ CFA Distal 66                          triphasic                 +-----------+--------+-----+---------------+-----------------+--------+ DFA        133                         triphasic                 +-----------+--------+-----+---------------+-----------------+--------+ SFA Prox   70                          blunted triphasic         +-----------+--------+-----+---------------+-----------------+--------+ SFA Mid    556          75-99% stenosismonophasic                +-----------+--------+-----+---------------+-----------------+--------+ SFA Distal 47  monophasic                +-----------+--------+-----+---------------+-----------------+--------+ POP Prox   37                          monophasic                +-----------+--------+-----+---------------+-----------------+--------+ POP Distal 23                          monophasic                +-----------+--------+-----+---------------+-----------------+--------+ TP Trunk   67                           monophasic                +-----------+--------+-----+---------------+-----------------+--------+ ATA Prox   57                          biphasic                  +-----------+--------+-----+---------------+-----------------+--------+ ATA Mid    33                          monophasic                +-----------+--------+-----+---------------+-----------------+--------+ ATA Distal 28                          monophasic                +-----------+--------+-----+---------------+-----------------+--------+ PTA Prox   26                          monophasic                +-----------+--------+-----+---------------+-----------------+--------+ PTA Mid                 occluded                                 +-----------+--------+-----+---------------+-----------------+--------+ PTA Distal              occluded                                 +-----------+--------+-----+---------------+-----------------+--------+ PERO Prox  38                          monophasic                +-----------+--------+-----+---------------+-----------------+--------+ PERO Mid   28                          monophasic                +-----------+--------+-----+---------------+-----------------+--------+ PERO Distal21                          monophasic                +-----------+--------+-----+---------------+-----------------+--------+ A focal velocity elevation of 556 cm/s was obtained at MID SFA with a VR of  10.7.  +-----------+--------+-----+--------+-----------------+------------------------+ LEFT       PSV cm/sRatioStenosisWaveform         Comments                 +-----------+--------+-----+--------+-----------------+------------------------+ CFA Prox   128                  triphasic                                 +-----------+--------+-----+--------+-----------------+------------------------+ CFA Distal 92                    triphasic                                 +-----------+--------+-----+--------+-----------------+------------------------+ DFA        134                  triphasic                                 +-----------+--------+-----+--------+-----------------+------------------------+ SFA Prox   57                   blunted triphasic                         +-----------+--------+-----+--------+-----------------+------------------------+ SFA Mid    56           occludedmultiphasic      collateral noted just                                                     proximal to occlusion    +-----------+--------+-----+--------+-----------------+------------------------+ SFA Distal              occluded                                          +-----------+--------+-----+--------+-----------------+------------------------+ POP Prox   19                   monophasic       recannulization          +-----------+--------+-----+--------+-----------------+------------------------+ POP Distal 39                   monophasic                                +-----------+--------+-----+--------+-----------------+------------------------+ TP Trunk   38                   monophasic                                +-----------+--------+-----+--------+-----------------+------------------------+ ATA Prox   25                   monophasic                                +-----------+--------+-----+--------+-----------------+------------------------+  ATA Mid    13                   monophasic                                +-----------+--------+-----+--------+-----------------+------------------------+ ATA Distal 9                    monophasic                                +-----------+--------+-----+--------+-----------------+------------------------+ PTA Prox   21                   monophasic                                 +-----------+--------+-----+--------+-----------------+------------------------+ PTA Mid                 occluded                                          +-----------+--------+-----+--------+-----------------+------------------------+ PTA Distal              occluded                                          +-----------+--------+-----+--------+-----------------+------------------------+ PERO Prox  15                   monophasic                                +-----------+--------+-----+--------+-----------------+------------------------+ PERO Mid   14                   monophasic                                +-----------+--------+-----+--------+-----------------+------------------------+ PERO Distal8                    monophasic                                +-----------+--------+-----+--------+-----------------+------------------------+  Summary: Right: 75-99% stenosis noted in the mid superficial femoral artery. Total occlusion noted in the posterior tibial artery. Left: Total occlusion noted in the mid- distal superficial femoral artery. Total occlusion noted in the posterior tibial artery. Bilateral homogenous plaque noted. See table(s) above for measurements and observations.  Patient scheduled to see Dr. Fletcher Anon on 02/13/2021 at 10:20 am. Vascular consult recommended. Electronically signed by Ida Rogue MD on 02/08/2021 at 5:23:18 PM.    Final    Disposition   Pt is being discharged home today in good condition.  Follow-up Plans & Appointments     Discharge Instructions     Diet - low sodium heart healthy   Complete by: As directed    Discharge instructions   Complete by: As directed    Resume all home medications today except Eliquis tomorrow No driving for  next 72 hours   Increase activity slowly   Complete by: As directed        Discharge Medications   Allergies as of 03/08/2021       Reactions   Beta Adrenergic Blockers Other (See Comments)    Scrotal edema with metoprolol and carvedilol   Tape Rash   Plastic tape        Medication List     TAKE these medications    acetaminophen 325 MG tablet Commonly known as: TYLENOL Take 650 mg by mouth every 6 (six) hours as needed for moderate pain or headache.   apixaban 5 MG Tabs tablet Commonly known as: Eliquis Take 1 tablet (5 mg total) by mouth 2 (two) times daily.   bisoprolol 5 MG tablet Commonly known as: ZEBETA Take 0.5 tablets (2.5 mg total) by mouth daily.   cetirizine 10 MG tablet Commonly known as: ZYRTEC Take 10 mg by mouth as needed for allergies.   clopidogrel 75 MG tablet Commonly known as: Plavix Take 1 tablet (75 mg total) by mouth daily.   Entresto 97-103 MG Generic drug: sacubitril-valsartan Take 1 tablet by mouth 2 (two) times daily.   LUBRICATING EYE DROPS OP Place 1 drop into both eyes daily as needed (dry eyes).   Nicotine 10 MG/ML Soln 1-2 puffs as needed   nicotine polacrilex 4 MG lozenge Commonly known as: COMMIT Take 4 mg by mouth as needed for smoking cessation.   PEPCID COMPLETE PO Take 1 tablet by mouth daily as needed (acid reflux).   rosuvastatin 20 MG tablet Commonly known as: CRESTOR Take 1 tablet (20 mg total) by mouth daily.   spironolactone 25 MG tablet Commonly known as: ALDACTONE Take 0.5 tablets (12.5 mg total) by mouth daily.           Outstanding Labs/Studies   N/A  Duration of Discharge Encounter   Greater than 30 minutes including physician time.  Signed, Leanor Kail, PA 03/08/2021, 1:24 PM  Agree with note by Robbie Lis PA-C  Pt had PV intervention by Dr Fletcher Anon yesterday and was sent home. Came back last night with a groin bleed. MYNX closure device was used. A femstop was used last night for hemostasis. Groin stable this AM. Duplex neg for PSA or hematoma. Labs OK. Stable for DC home. F/U with Dr Fletcher Anon.    Lorretta Harp, M.D., [place], [facility], [place] [address] [place], [place]  [id number]  [phone] 03/08/2021 2:48 PM

## 2021-03-08 NOTE — ED Notes (Signed)
Patient transported to vascular. 

## 2021-03-08 NOTE — ED Notes (Signed)
PMS present in both lower extremities, right continues to be cooler than left. Bleeding controlled, device removed and replaced with 2x2 and Tegaderm.

## 2021-03-13 ENCOUNTER — Telehealth: Payer: Self-pay | Admitting: Cardiovascular Disease

## 2021-03-13 NOTE — Telephone Encounter (Signed)
Spoke with the patient about why he needs to have the post procedure LEA and ABI. He stated that he understood but has a hard time trying to work it into his schedule. He has been advised we will send a message to scheduling and that it was okay if he had to have his appointment with Dr. Fletcher Anon first as long as he did get the dopplers scheduled.

## 2021-03-13 NOTE — Telephone Encounter (Signed)
Patient returned call to schedule LEA with ABI Per request from RN. He states he doesn't understand the purpose of this testing and why it wasn't scheduled when his appointment with Dr. Fletcher Anon was initially scheduled. I tried to explain what the test was for, but the patient continuously asked, "why?" I offered to send a message to Dr. Tyrell Antonio nurse to see if she can provide insight and patient agreed. Please return call when able.

## 2021-03-14 ENCOUNTER — Encounter (HOSPITAL_COMMUNITY): Payer: Self-pay | Admitting: Cardiovascular Disease

## 2021-03-19 ENCOUNTER — Other Ambulatory Visit (HOSPITAL_COMMUNITY): Payer: Self-pay | Admitting: Cardiovascular Disease

## 2021-03-19 ENCOUNTER — Other Ambulatory Visit (HOSPITAL_COMMUNITY): Payer: Self-pay

## 2021-03-19 ENCOUNTER — Telehealth (HOSPITAL_COMMUNITY): Payer: Self-pay

## 2021-03-19 ENCOUNTER — Other Ambulatory Visit: Payer: Self-pay

## 2021-03-19 ENCOUNTER — Ambulatory Visit (HOSPITAL_COMMUNITY)
Admission: RE | Admit: 2021-03-19 | Discharge: 2021-03-19 | Disposition: A | Discharge status: Home / Self Care | Payer: 59 | Source: Ambulatory Visit | Attending: Internal Medicine | Admitting: Internal Medicine

## 2021-03-19 DIAGNOSIS — I739 Peripheral vascular disease, unspecified: Secondary | ICD-10-CM

## 2021-03-19 DIAGNOSIS — Z95820 Peripheral vascular angioplasty status with implants and grafts: Secondary | ICD-10-CM

## 2021-03-19 NOTE — Telephone Encounter (Signed)
Transitions of Care Pharmacy  ° °Call attempted for a pharmacy transitions of care follow-up. HIPAA appropriate voicemail was left with call back information provided.  ° °Call attempt #1. Will follow-up in 2-3 days.  °  °

## 2021-03-20 ENCOUNTER — Telehealth (HOSPITAL_COMMUNITY): Payer: Self-pay | Admitting: Student-PharmD

## 2021-03-20 ENCOUNTER — Telehealth: Payer: Self-pay | Admitting: Licensed Clinical Social Worker

## 2021-03-20 ENCOUNTER — Encounter: Payer: Self-pay | Admitting: Cardiovascular Disease

## 2021-03-20 ENCOUNTER — Ambulatory Visit (INDEPENDENT_AMBULATORY_CARE_PROVIDER_SITE_OTHER): Payer: 59 | Admitting: Cardiovascular Disease

## 2021-03-20 ENCOUNTER — Other Ambulatory Visit (HOSPITAL_COMMUNITY): Payer: Self-pay

## 2021-03-20 ENCOUNTER — Encounter (HOSPITAL_COMMUNITY): Payer: Self-pay | Admitting: *Deleted

## 2021-03-20 VITALS — BP 125/74 | HR 66 | Ht 74.0 in | Wt 194.2 lb

## 2021-03-20 DIAGNOSIS — E785 Hyperlipidemia, unspecified: Secondary | ICD-10-CM | POA: Diagnosis not present

## 2021-03-20 DIAGNOSIS — I5022 Chronic systolic (congestive) heart failure: Secondary | ICD-10-CM

## 2021-03-20 DIAGNOSIS — I4819 Other persistent atrial fibrillation: Secondary | ICD-10-CM

## 2021-03-20 DIAGNOSIS — I739 Peripheral vascular disease, unspecified: Secondary | ICD-10-CM

## 2021-03-20 NOTE — Progress Notes (Signed)
Cardiology Office Note   Date:  03/20/2021   ID:  Christopher Gutierrez, DOB 1960-09-17, MRN [id number]  PCP:  Christopher Nutting, DO  Cardiologist: Dr. [name].  No chief complaint on file.     History of Present Illness: Christopher Gutierrez is a 60 y.o. male who is here today for a follow-up visit regarding peripheral arterial disease.   He has known history of atrial fibrillation/flutter, essential hypertension, tobacco use, and chronic systolic heart failure with severely reduced LV systolic function. The patient was diagnosed with heart failure in March in the setting of atrial fibrillation with rapid ventricular response.  Echocardiogram showed an EF of 20 to 25%.  He underwent successful TEE guided cardioversion.  Heart failure improved gradually with medical therapy with most recent ejection fraction 40 to 45%.  He was seen recently for severe left calf claudication with rest pain and ischemic changes in the toes.  Lower extremity arterial Doppler studies showed an ABI of 0.76 on the right and 0.41 on the left with no flow detected in the toe.  Duplex on the right showed severe mid SFA stenosis with occluded posterior tibial artery.  On the left, the SFA was occluded in the mid to distal segment with occluded posterior tibial artery.  I proceeded with angiography earlier this month which showed no significant aortoiliac disease, on the left lower extremity, the distal SFA was occluded into the proximal popliteal artery with two-vessel runoff below the knee.  On the right side, there was severe focal stenosis in the distal SFA.  I performed successful angioplasty and 2 overlapped Biom[employer]ics self-expanding stents to the left SFA into the proximal popliteal artery. Postprocedure ABI improved to normal on the left side and was mildly reduced on the right side of 0.81.  Duplex showed normal velocities in the left SFA/popliteal artery stents. He had some localized bleeding after the  procedure and thus he came back to the emergency room.  Ultrasound showed no evidence of hematoma or pseudoaneurysm.  His hemoglobin was stable with no significant drop. Left leg claudication resolved completely.  He has not been exercising on a regular basis.  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  He is currently on short-term disability.  He continues to feel very weak and deconditioned.  He cut down on tobacco use to half a pack per day.  There is history of excessive alcohol use but not recently.  Past Medical History:  Diagnosis Date   Atrial fibrillation ([place]) 10/24/2020   Atrial fibrillation and flutter ([place])    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   Hypertension     Past Surgical History:  Procedure Laterality Date   ABDOMINAL AORTOGRAM W/LOWER EXTREMITY N/A 03/07/2021   Procedure: ABDOMINAL AORTOGRAM W/LOWER EXTREMITY;  Surgeon: Wellington Hampshire, MD;  Location: [place] [facility];  Service: Cardiovascular;  Laterality: N/A;   CARDIOVERSION N/A 11/01/2020   Procedure: CARDIOVERSION;  Surgeon: Jerline Pain, MD;  Location: [facility] ENDOSCOPY;  Service: Cardiovascular;  Laterality: N/A;   CATARACT EXTRACTION Right    MOUTH SURGERY  2013   PERIPHERAL VASCULAR INTERVENTION Left 03/07/2021   Procedure: PERIPHERAL VASCULAR INTERVENTION;  Surgeon: Wellington Hampshire, MD;  Location: [place] [facility];  Service: Cardiovascular;  Laterality: Left;  left SFA   TEE WITHOUT CARDIOVERSION N/A 11/01/2020   Procedure: TRANSESOPHAGEAL ECHOCARDIOGRAM (TEE);  Surgeon: Jerline Pain,  MD;  Location: [facility] ENDOSCOPY;  Service: Cardiovascular;  Laterality: N/A;     Current Outpatient Medications  Medication Sig Dispense Refill   acetaminophen (TYLENOL) 325 MG tablet Take 650 mg by mouth every 6 (six) hours as needed for moderate pain or headache.     apixaban (ELIQUIS) 5 MG TABS tablet Take 1 tablet (5 mg total) by mouth 2 (two)  times daily. 180 tablet 3   bisoprolol (ZEBETA) 5 MG tablet Take 0.5 tablets (2.5 mg total) by mouth daily. 45 tablet 3   Carboxymethylcellul-Glycerin (LUBRICATING EYE DROPS OP) Place 1 drop into both eyes daily as needed (dry eyes).     cetirizine (ZYRTEC) 10 MG tablet Take 10 mg by mouth as needed for allergies.     clopidogrel (PLAVIX) 75 MG tablet Take 1 tablet (75 mg total) by mouth daily. 30 tablet 6   Famotidine-Ca Carb-Mag Hydrox (PEPCID COMPLETE PO) Take 1 tablet by mouth daily as needed (acid reflux).     Nicotine 10 MG/ML SOLN 1-2 puffs as needed 10 mL 2   nicotine polacrilex (COMMIT) 4 MG lozenge Take 4 mg by mouth as needed for smoking cessation.     rosuvastatin (CRESTOR) 20 MG tablet Take 1 tablet (20 mg total) by mouth daily. 30 tablet 6   sacubitril-valsartan (ENTRESTO) 97-103 MG Take 1 tablet by mouth 2 (two) times daily. 60 tablet 6   spironolactone (ALDACTONE) 25 MG tablet Take 0.5 tablets (12.5 mg total) by mouth daily. 15 tablet 5   Current Facility-Administered Medications  Medication Dose Route Frequency Provider Last Rate Last Admin   sodium chloride flush (NS) 0.9 % injection 3 mL  3 mL Intravenous Q12H Wellington Hampshire, MD        Allergies:   Beta adrenergic blockers and Tape    Social History:  The patient  reports that he has been smoking cigarettes. He has a 37.50 pack-year smoking history. His smokeless tobacco use includes chew. He reports current alcohol use of about 24.0 standard drinks per week. He reports current drug use. Frequency: 2.00 times per week. Drug: Marijuana.   Family History:  The patient's family history includes Birth defects in his paternal aunt; COPD in his maternal uncle; Gout in his brother; Hypertension in his brother; Stroke in his paternal aunt.    ROS:  Please see the history of present illness.   Otherwise, review of systems are positive for .   All other systems are reviewed and negative.    PHYSICAL EXAM: VS:  BP 125/74    Pulse 66   Ht '6\' 2"'$  (1.88 m)   Wt 194 lb 3.2 oz (88.1 kg)   SpO2 97%   BMI 24.93 kg/m  , BMI Body mass index is 24.93 kg/m. GEN: Well nourished, well developed, in no acute distress  HEENT: normal  Neck: no JVD, carotid bruits, or masses Cardiac: RRR; no murmurs, rubs, or gallops,no edema  Respiratory:  clear to auscultation bilaterally, normal work of breathing GI: soft, nontender, nondistended, + BS MS: no deformity or atrophy  Skin: warm and dry, no rash Neuro:  Strength and sensation are intact Psych: euthymic mood, full affect Vascular: There is superficial bruising in the right groin but no hematoma.  Distal pulses are palpable on the left side.   EKG:  EKG is not ordered today.    Recent Labs: 10/12/2020: TSH 2.06 10/30/2020: ALT 30 11/01/2020: Magnesium 2.2 01/26/2021: B Natriuretic Peptide 385.9 03/07/2021: BUN 11; Creatinine, Ser 1.06; Potassium 3.7; Sodium  134 03/08/2021: Hemoglobin 16.9; Platelets 130    Lipid Panel    Component Value Date/Time   CHOL 229 (H) 01/26/2021 1213   TRIG 105 01/26/2021 1213   HDL 69 01/26/2021 1213   CHOLHDL 3.3 01/26/2021 1213   VLDL 21 01/26/2021 1213   LDLCALC 139 (H) 01/26/2021 1213      Wt Readings from Last 3 Encounters:  03/20/21 194 lb 3.2 oz (88.1 kg)  03/07/21 194 lb 14.2 oz (88.4 kg)  03/07/21 195 lb (88.5 kg)        No flowsheet data found.    ASSESSMENT AND PLAN:  1.  Peripheral arterial disease: Severe left lower extremity claudication and rest pain.  Status post recent successful endovascular intervention on the left SFA and popliteal artery with complete resolution of symptoms and normalization of ABI.   He does have residual right SFA disease but currently is not having much symptoms likely because he is not exerting himself enough.  I suspect that he will require staged right SFA intervention. He seems to be very deconditioned.  I asked him to start daily walking for at least 30 minutes. Continue  clopidogrel without aspirin given that he is on Eliquis.  This will be continued for at least another few months.  2.  Chronic systolic heart failure: He appears to be euvolemic without furosemide.  He is currently on bisoprolol, spironolactone and Entresto.  3.  Persistent atrial fibrillation: He is maintaining in sinus rhythm and tolerating anticoagulation.  4.  Tobacco use: He cut down but has not been able to quit smoking completely.  I again discussed with him the importance of smoking cessation.    5.  Hyperlipidemia: Continue treatment with rosuvastatin with target LDL of less than 70.  6.  Short-term disability: The patient continues to be significantly deconditioned.  Nonetheless, his medical conditions improved and I suspect that he will likely be able to go back to work in the next 2 months.  Until then, I am going to refer him to cardiac rehab to see if we can improve his conditioning.     Disposition: Follow-up in 3 months.  Signed,  Kathlyn Sacramento, MD  03/20/2021 10:05 AM    [place]

## 2021-03-20 NOTE — Telephone Encounter (Signed)
Transitions of Care Pharmacy   Call attempted for a pharmacy transitions of care follow-up. HIPAA appropriate voicemail was left with call back information provided.   Call attempt #2. Will follow-up in 2-3 days.    

## 2021-03-20 NOTE — Patient Instructions (Signed)
Medication Instructions:  No Changes In Medications at this time.  *If you need a refill on your cardiac medications before your next appointment, please call your pharmacy*  Follow-Up: At [facility], you and your health needs are our priority.  As part of our continuing mission to provide you with exceptional heart care, we have created designated Provider Care Teams.  These Care Teams include your primary Cardiologist (physician) and Advanced Practice Providers (APPs -  Physician Assistants and Nurse Practitioners) who all work together to provide you with the care you need, when you need it.  Your next appointment:   3 month(s)  The format for your next appointment:   In Person  Provider:   Kathlyn Sacramento, MD  Other Instructions REFERRAL TO [place]

## 2021-03-20 NOTE — Telephone Encounter (Signed)
Received referral from Belinda Block, RN, that pt currently out of work and expressed some financial challenges. Per chart review pt currently on short term disability. Attempted to reach pt to complete SDOH wheel and assess any challenges LCSW team can assist with at this time. Phone went straight to voicemail, message left at [phone].  Westley Hummer, MSW, [place]  [phone]

## 2021-03-20 NOTE — Progress Notes (Signed)
Received referral from Dr. Fletcher Anon for this pt to participate in Cardiac rehab with the diagnosis of Chronic Systolic Heart failure.Clinical review of pt follow up appt on 8/16 along with prior office visit with Dr. [name]. Pt appropriate for scheduling for on site cardiac rehab and/or enrollment in Virtual Cardiac Rehab.  Pt Covid Risk Score is 3  Will forward to staff for follow up, insurance benefits and scheduling. Cherre Huger, BSN Cardiac and Training and development officer

## 2021-03-21 ENCOUNTER — Telehealth (HOSPITAL_COMMUNITY): Payer: Self-pay

## 2021-03-21 ENCOUNTER — Other Ambulatory Visit (HOSPITAL_COMMUNITY): Payer: Self-pay

## 2021-03-21 NOTE — Telephone Encounter (Signed)
Transitions of Care Pharmacy   Call attempted for a pharmacy transitions of care follow-up. HIPAA appropriate voicemail was left with call back information provided.   Call attempt #3. Will not attempt follow up.

## 2021-03-21 NOTE — Telephone Encounter (Signed)
Pharmacy Transitions of Care Follow-up Telephone Call  Date of discharge: 03/08/2021  Discharge Diagnosis: PAD  How have you been since you were released from the hospital?  Patient been well since discharge. Clopidogrel makes him dizzy in the morning so he has been taking it at night. MD is aware of this. Patient has also started Eliquis as well since discharge.  Medication changes made at discharge:  - START: Plavix  Medication changes verified by the patient? Yes  Medication Accessibility:  Home Pharmacy: [place]  Was the patient provided with refills on discharged medications? Yes, 6 refills on Plavix   Have all prescriptions been transferred from [facility] to home pharmacy?  Yes  Is the patient able to afford medications? Patient has Pharmacist, hospital.    Medication Review:  CLOPIDOGREL (PLAVIX) Clopidogrel 75 mg once daily.  - Advised patient of medications to avoid (NSAIDs, ASA)  - Educated that Tylenol (acetaminophen) will be the preferred analgesic to prevent risk of bleeding  - Emphasized importance of monitoring for signs and symptoms of bleeding (abnormal bruising, prolonged bleeding, nose bleeds, bleeding from gums, discolored urine, black tarry stools)  - Advised patient to alert all providers of anticoagulation therapy prior to starting a new medication or having a procedure   Follow-up Appointments:  PCP Hospital f/u appt confirmed? None currently scheduled  Specialist Hospital f/u appt confirmed? Scheduled to see Dr. Fletcher Anon on 03/20/21 @ Cardiology.   If their condition worsens, is the pt aware to call PCP or go to the Emergency Dept.? Yes  Final Patient Assessment:  Patient has already had follow up and has refills at home pharmacy

## 2021-03-26 ENCOUNTER — Other Ambulatory Visit: Payer: Self-pay | Admitting: Cardiology

## 2021-03-26 NOTE — Telephone Encounter (Signed)
Eliquis 5 mg refill request received. Patient is 59 years old, weight-88.1 kg, Crea- 1.06 on 03/07/21 via epic, Diagnosis-afib, and last seen by Dr. Fletcher Anon on 03/20/21. Dose is appropriate based on dosing criteria. Will send in refill to requested pharmacy.

## 2021-03-29 NOTE — Telephone Encounter (Signed)
Will forward to [place] R to see if she have any forms for this patient.

## 2021-04-03 ENCOUNTER — Telehealth: Payer: Self-pay

## 2021-04-03 NOTE — Telephone Encounter (Signed)
Left detailed message we have completed his disability paper work for him to be out until May 05, 2021 and sent to [employer]. [phone] fax. Attn: Absence Managment

## 2021-04-26 ENCOUNTER — Telehealth (HOSPITAL_COMMUNITY): Payer: Self-pay

## 2021-04-26 NOTE — Telephone Encounter (Signed)
Pt insurance is active and benefits verified through [facility]. Co-pay $0.00, DED $1,800.00/$1,800.00 met, out of pocket $3,600.00/$3,600.00 met, co-insurance 20%. No pre-authorization required. Passport, 04/26/21 @ 11:39AM, NOH#[id number]   Will contact patient to see if he is interested in the Cardiac Rehab Program.

## 2021-04-26 NOTE — Telephone Encounter (Signed)
Called patient to see if he was interested in participating in the Cardiac Rehab Program. Patient stated yes. Patient will come in for orientation on 05/17/21 @ 10:30AM and will attend the 1:45PM exercise class.    Went over insurance, patient verbalized understanding.    Tourist information centre manager.

## 2021-04-29 NOTE — Progress Notes (Signed)
ADVANCED HF CLINIC NOTE   Primary Care: [name], DO Primary Cardiologist: Oswaldo Milian HF Cardiologist: Dr. [name]   HPI:  Christopher Gutierrez is a 60 y.o. male  with a PMH significant for Afib/Flutter, HTN, Tobacco use disorder, Alcohol use and systolic HF.   Prior to 10/2020 not really seeing a physician.  Established with a new PCP Dr. [name] due to ongoing leg pain and shortness of breath.  Ecg done in pcp office which showed afib with rvr and he was referred to cardiology Dr. Gardiner Rhyme. Seen on 10/27/20, remained in afib w/rvr, was offered admission but declined and was started on carvedilol and Eliquis.  Symptoms worsened and he returned to clinic on 10/30/20 and was direct admitted for volume overload and afib w/ rvr.  Had an ECHO which demonstrated EF 20-25%, reduced RV function.  Started on IV diuretics with good response and received successful TEE/DCCV with restoration of NSR.  He declined bb before discharge (associates carvedilol and metoprolol with scrotal edema) but was started on losartan and spironolactone.    Today he returns for HF follow up. Previous AHF visits, his GDMT has been titrated. He is not significantly SOB with walking on flat ground or lifting items. Some occasional dizziness when standing. Denies CP, edema, palpitations, abnormal bleeding or PND/Orthopnea. Appetite ok. No fever or chills. Weight at home 195 pounds. Taking all medications. Has not needed lasix in months. Now smoking less than 1/2 ppd. Occasional ETOH use. Claudication-type pain much-improved since last visit. Having some anxiety about life stressor.  Past Medical History:  Diagnosis Date   Atrial fibrillation ([place]) 10/24/2020   Atrial fibrillation and flutter ([place])    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   Hypertension    Current Outpatient Medications  Medication Sig Dispense Refill   acetaminophen (TYLENOL) 325 MG tablet Take 650 mg by mouth every 6  (six) hours as needed for moderate pain or headache.     bisoprolol (ZEBETA) 5 MG tablet Take 0.5 tablets (2.5 mg total) by mouth daily. 45 tablet 3   cetirizine (ZYRTEC) 10 MG tablet Take 10 mg by mouth as needed for allergies.     clopidogrel (PLAVIX) 75 MG tablet Take 1 tablet (75 mg total) by mouth daily. 30 tablet 6   ELIQUIS 5 MG TABS tablet TAKE 1 TABLET BY MOUTH TWICE A DAY 60 tablet 3   Famotidine-Ca Carb-Mag Hydrox (PEPCID COMPLETE PO) Take 1 tablet by mouth daily as needed (acid reflux).     Nicotine 10 MG/ML SOLN 1-2 puffs as needed 10 mL 2   nicotine polacrilex (COMMIT) 4 MG lozenge Take 4 mg by mouth as needed for smoking cessation.     rosuvastatin (CRESTOR) 20 MG tablet Take 1 tablet (20 mg total) by mouth daily. 30 tablet 6   sacubitril-valsartan (ENTRESTO) 97-103 MG Take 1 tablet by mouth 2 (two) times daily. 60 tablet 6   spironolactone (ALDACTONE) 25 MG tablet Take 0.5 tablets (12.5 mg total) by mouth daily. 15 tablet 5   Current Facility-Administered Medications  Medication Dose Route Frequency Provider Last Rate Last Admin   sodium chloride flush (NS) 0.9 % injection 3 mL  3 mL Intravenous Q12H Wellington Hampshire, MD       Allergies  Allergen Reactions   Beta Adrenergic Blockers Other (See Comments)    Scrotal edema with metoprolol and carvedilol   Tape Rash    Plastic tape   Social History  Socioeconomic History   Marital status: Divorced    Spouse name: Not on file   Number of children: 1   Years of education: Not on file   Highest education level: Master's degree (e.g., MA, MS, MEng, MEd, MSW, MBA)  Occupational History   Not on file  Tobacco Use   Smoking status: Some Days    Packs/day: 1.50    Years: 25.00    Pack years: 37.50    Types: Cigarettes   Smokeless tobacco: Current    Types: Chew  Vaping Use   Vaping Use: Never used  Substance and Sexual Activity   Alcohol use: Yes    Alcohol/week: 24.0 standard drinks    Types: 24 Cans of beer per  week    Comment: 6 cans every other day   Drug use: Yes    Frequency: 2.0 times per week    Types: Marijuana    Comment: he took Marijuana 2 weeks ago   Sexual activity: Yes    Partners: Female  Other Topics Concern   Not on file  Social History Narrative   Not on file   Social Determinants of Health   Financial Resource Strain: Low Risk    Difficulty of Paying Living Expenses: Not hard at all  Food Insecurity: No Food Insecurity   Worried About Charity fundraiser in the Last Year: Never true   Arboriculturist in the Last Year: Never true  Transportation Needs: No Transportation Needs   Lack of Transportation (Medical): No   Lack of Transportation (Non-Medical): No  Physical Activity: Not on file  Stress: Not on file  Social Connections: Not on file  Intimate Partner Violence: Not on file   Family History  Problem Relation Age of Onset   Gout Brother    Hypertension Brother    COPD Maternal Uncle    Stroke Paternal Aunt    Birth defects Paternal Aunt    BP (!) 156/86   Pulse 62   Wt 88.5 kg (195 lb)   SpO2 98%   BMI 25.04 kg/m   Wt Readings from Last 3 Encounters:  04/30/21 88.5 kg (195 lb)  03/20/21 88.1 kg (194 lb 3.2 oz)  03/07/21 88.4 kg (194 lb 14.2 oz)   PHYSICAL EXAM: General:  NAD. No resp difficulty HEENT: Normal Neck: Supple. No JVD. Carotids 2+ bilat; no bruits. No lymphadenopathy or thryomegaly appreciated. Cor: PMI nondisplaced. Regular rate & rhythm. No rubs, gallops or murmurs. Lungs: Clear Abdomen: Soft, nontender, nondistended. No hepatosplenomegaly. No bruits or masses. Good bowel sounds. Extremities: No cyanosis, clubbing, rash, edema Neuro: Alert & oriented x 3, cranial nerves grossly intact. Moves all 4 extremities w/o difficulty. Affect pleasant.  ECG (personally reviewed): SB 59 bpm  ReDs: 35%  ASSESSMENT & PLAN:   Chronic Systolic CHF, Biventricular CHF:   - New diagnosis in the setting of afib w/rvr, alcohol use and heavy  smoking history - 10/2020 EF 20%, BIV failure - Echo 01/26/21 with significant improvement in EF 45-50%, normal RV function - NYHA Class I-II symptoms, volume OK on exam, ReDs 35% - Start Farxiga 10 mg daily. (Last A1c 5.3. Discussed side effects) $0/30 day supply. - Continue spiro 12.5 mg daily. - Continue Entresto 97/103 mg daily. - Continue bisoprolol 2.5 mg daily. - Continue lasix PRN. - He has home sleep study equipment and is authorized by insurance. We discussed him completing this soon.  - Start CR next month. - BMET today; repeat in 10  days.   2. Persistent Afib: - New diagnosis 10/2020 but may have been in afib for some time - s/p TEE/DCCV 11/01/20. - CHADS2VASC 2, continue Eliquis. - Remains in NSR on ECG today. - Continue bisoprolol 2.5  3. HTN - Elevated today, he says BP is always higher at his medical appointments. - I have asked him to check his BP at home and log. - Consider increasing spiro.   4. Tobacco Use Disorder: - Still smoking 1/2 ppd. - Using lozenges. Encouraged cessation.   5. Polycythemia: - Likely secondary to smoking, sleep apnea. - Hematology following. Jak 2 negative.   6. Alcohol Use: - Continued to cut back on alcohol use rarely drinking now.  7. Left Leg Claudication: - ABI (8/22): Widely patent mid SFA to above-the-knee popliteal artery stent without evidence of stenosis. Repeat 6 months - Encouraged walking program. - Continue statin + Eliquis. - Discussed importance of complete smoking cessation.  He can transfer his care back to [facility] and we will be happy to see him on an as-needed basis, discussed with Dr. [name].  Allena Katz, FNP-BC 04/30/21

## 2021-04-30 ENCOUNTER — Encounter (HOSPITAL_COMMUNITY): Payer: Self-pay

## 2021-04-30 ENCOUNTER — Ambulatory Visit (HOSPITAL_COMMUNITY)
Admission: RE | Admit: 2021-04-30 | Discharge: 2021-04-30 | Disposition: A | Discharge status: Home / Self Care | Payer: 59 | Source: Ambulatory Visit | Attending: Family Medicine | Admitting: Family Medicine

## 2021-04-30 ENCOUNTER — Telehealth (HOSPITAL_COMMUNITY): Payer: Self-pay

## 2021-04-30 ENCOUNTER — Other Ambulatory Visit (HOSPITAL_COMMUNITY): Payer: Self-pay

## 2021-04-30 ENCOUNTER — Other Ambulatory Visit: Payer: Self-pay

## 2021-04-30 VITALS — BP 156/86 | HR 62 | Wt 195.0 lb

## 2021-04-30 DIAGNOSIS — Z7289 Other problems related to lifestyle: Secondary | ICD-10-CM | POA: Insufficient documentation

## 2021-04-30 DIAGNOSIS — I5022 Chronic systolic (congestive) heart failure: Secondary | ICD-10-CM

## 2021-04-30 DIAGNOSIS — I5082 Biventricular heart failure: Secondary | ICD-10-CM | POA: Diagnosis not present

## 2021-04-30 DIAGNOSIS — Z7901 Long term (current) use of anticoagulants: Secondary | ICD-10-CM | POA: Diagnosis not present

## 2021-04-30 DIAGNOSIS — Z8249 Family history of ischemic heart disease and other diseases of the circulatory system: Secondary | ICD-10-CM | POA: Insufficient documentation

## 2021-04-30 DIAGNOSIS — D751 Secondary polycythemia: Secondary | ICD-10-CM | POA: Diagnosis not present

## 2021-04-30 DIAGNOSIS — I739 Peripheral vascular disease, unspecified: Secondary | ICD-10-CM | POA: Diagnosis not present

## 2021-04-30 DIAGNOSIS — Z733 Stress, not elsewhere classified: Secondary | ICD-10-CM | POA: Diagnosis not present

## 2021-04-30 DIAGNOSIS — Z79899 Other long term (current) drug therapy: Secondary | ICD-10-CM | POA: Insufficient documentation

## 2021-04-30 DIAGNOSIS — I4819 Other persistent atrial fibrillation: Secondary | ICD-10-CM

## 2021-04-30 DIAGNOSIS — I110 Hypertensive heart disease with heart failure: Secondary | ICD-10-CM | POA: Insufficient documentation

## 2021-04-30 DIAGNOSIS — Z888 Allergy status to other drugs, medicaments and biological substances status: Secondary | ICD-10-CM | POA: Diagnosis not present

## 2021-04-30 DIAGNOSIS — I10 Essential (primary) hypertension: Secondary | ICD-10-CM

## 2021-04-30 DIAGNOSIS — F17210 Nicotine dependence, cigarettes, uncomplicated: Secondary | ICD-10-CM | POA: Insufficient documentation

## 2021-04-30 DIAGNOSIS — Z720 Tobacco use: Secondary | ICD-10-CM

## 2021-04-30 DIAGNOSIS — Z789 Other specified health status: Secondary | ICD-10-CM

## 2021-04-30 LAB — BASIC METABOLIC PANEL
Anion gap: 8 (ref 5–15)
BUN: 12 mg/dL (ref 6–20)
CO2: 24 mmol/L (ref 22–32)
Calcium: 9.1 mg/dL (ref 8.9–10.3)
Chloride: 106 mmol/L (ref 98–111)
Creatinine, Ser: 1.03 mg/dL (ref 0.61–1.24)
GFR, Estimated: >60 mL/min (ref >60)
Glucose, Bld: 102 mg/dL — ABNORMAL HIGH (ref 70–99)
Potassium: 3.7 mmol/L (ref 3.5–5.1)
Sodium: 138 mmol/L (ref 135–145)

## 2021-04-30 MED ORDER — DAPAGLIFLOZIN PROPANEDIOL 10 MG PO TABS: 10.0000 mg | ORAL_TABLET | Freq: Every day | ORAL | 4 refills | Status: DC

## 2021-04-30 NOTE — Addendum Note (Signed)
Encounter addended by: Rockwell Alexandria, CMA on: 04/30/2021 12:08 PM  Actions taken: Clinical Note Signed

## 2021-04-30 NOTE — Patient Instructions (Addendum)
Labs were done today, if any labs are abnormal the clinic will call you  EKG performed today  START Farxiga 10 mg (1 tablet) daily   Call [facility] to schedule a appointment in 2-3 months [phone]  CHECK your blood pressure at home the goal is a systolic (top) number less than 130  At the [facility], you and your health needs are our priority. As part of our continuing mission to provide you with exceptional heart care, we have created designated Provider Care Teams. These Care Teams include your primary Cardiologist (physician) and Advanced Practice Providers (APPs- Physician Assistants and Nurse Practitioners) who all work together to provide you with the care you need, when you need it.   You may see any of the following providers on your designated Care Team at your next follow up: Dr Glori Bickers Dr Loralie Champagne Dr Patrice Paradise, NP Lyda Jester, [place] Ginnie Smart Audry Riles, PharmD   Please be sure to bring in all your medications bottles to every appointment.    If you have any questions or concerns before your next appointment please send [place] a message through [place] or call our office at [phone].    TO LEAVE A MESSAGE FOR THE NURSE SELECT OPTION 2, PLEASE LEAVE A MESSAGE INCLUDING: YOUR NAME DATE OF BIRTH CALL BACK NUMBER REASON FOR CALL**this is important as we prioritize the call backs  YOU WILL RECEIVE A CALL BACK THE SAME DAY AS LONG AS YOU CALL BEFORE 4:00 PM

## 2021-05-11 NOTE — Telephone Encounter (Signed)
Open end error

## 2021-05-14 ENCOUNTER — Telehealth (HOSPITAL_COMMUNITY): Payer: Self-pay | Admitting: *Deleted

## 2021-05-16 ENCOUNTER — Telehealth (HOSPITAL_COMMUNITY): Payer: Self-pay

## 2021-05-17 ENCOUNTER — Other Ambulatory Visit: Payer: Self-pay

## 2021-05-17 ENCOUNTER — Encounter (HOSPITAL_COMMUNITY)
Admission: RE | Admit: 2021-05-17 | Discharge: 2021-05-17 | Disposition: A | Discharge status: Home / Self Care | Payer: 59 | Source: Ambulatory Visit | Attending: Cardiovascular Disease | Admitting: Cardiovascular Disease

## 2021-05-17 ENCOUNTER — Encounter (HOSPITAL_COMMUNITY): Payer: Self-pay

## 2021-05-17 VITALS — BP 150/87 | HR 59 | Ht 73.5 in | Wt 196.9 lb

## 2021-05-17 DIAGNOSIS — I5022 Chronic systolic (congestive) heart failure: Secondary | ICD-10-CM | POA: Insufficient documentation

## 2021-05-17 NOTE — Progress Notes (Signed)
At the end of the pre assessment for Cardiac Rehab - 6 minute walk test, Christopher Gutierrez complained of 2/10 "chest tightness" when he takes a breath in.  This has happened from time to time and resolves immediately upon exhalation.  Weight is up 1 kg from previous documented reading. Christopher Gutierrez did smoke several cigarettes prior to coming in the building due to anxiety and not knowing what to expect today. BP 160/87 recheck 146/86. Reassured Christopher Gutierrez and offered support. Cherre Huger, BSN Cardiac and Training and development officer

## 2021-05-17 NOTE — Progress Notes (Signed)
Cardiac Individual Treatment Plan  Patient Details  Name: Jerimey Burridge MRN: [id number] Date of Birth: [birth year] Referring Provider:   Flowsheet Row CARDIAC REHAB PHASE II ORIENTATION from 05/17/2021 in [facility]  Referring Provider Dr. Kathlyn Sacramento MD       Initial Encounter Date:  [place] from 05/17/2021 in [facility]  Date 05/17/21       Visit Diagnosis: Heart failure, chronic systolic ([place])  Patient's Home Medications on Admission:  Current Outpatient Medications:    acetaminophen (TYLENOL) 325 MG tablet, Take 650 mg by mouth every 6 (six) hours as needed for moderate pain or headache., Disp: , Rfl:    bisoprolol (ZEBETA) 5 MG tablet, Take 0.5 tablets (2.5 mg total) by mouth daily., Disp: 45 tablet, Rfl: 3   cetirizine (ZYRTEC) 10 MG tablet, Take 10 mg by mouth as needed for allergies., Disp: , Rfl:    clopidogrel (PLAVIX) 75 MG tablet, Take 1 tablet (75 mg total) by mouth daily., Disp: 30 tablet, Rfl: 6   dapagliflozin propanediol (FARXIGA) 10 MG TABS tablet, Take 1 tablet (10 mg total) by mouth daily before breakfast., Disp: 30 tablet, Rfl: 4   ELIQUIS 5 MG TABS tablet, TAKE 1 TABLET BY MOUTH TWICE A DAY, Disp: 60 tablet, Rfl: 3   Famotidine-Ca Carb-Mag Hydrox (PEPCID COMPLETE PO), Take 1 tablet by mouth daily as needed (acid reflux)., Disp: , Rfl:    Nicotine 10 MG/ML SOLN, 1-2 puffs as needed, Disp: 10 mL, Rfl: 2   nicotine polacrilex (COMMIT) 4 MG lozenge, Take 4 mg by mouth as needed for smoking cessation., Disp: , Rfl:    rosuvastatin (CRESTOR) 20 MG tablet, Take 1 tablet (20 mg total) by mouth daily., Disp: 30 tablet, Rfl: 6   sacubitril-valsartan (ENTRESTO) 97-103 MG, Take 1 tablet by mouth 2 (two) times daily., Disp: 60 tablet, Rfl: 6   spironolactone (ALDACTONE) 25 MG tablet, Take 0.5 tablets (12.5 mg total) by mouth daily., Disp: 15 tablet, Rfl:  5  Past Medical History: Past Medical History:  Diagnosis Date   Atrial fibrillation ([place]) 10/24/2020   Atrial fibrillation and flutter ([place])    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  Not ready to quit.    Labs: Recent Review Flowsheet Data     Labs for ITP Cardiac and Pulmonary Rehab Latest Ref Rng & Units 10/12/2020 10/31/2020 01/26/2021   Cholestrol 0 - 200 mg/dL - 153 229(H)   LDLCALC 0 - 99 mg/dL - 103(H) 139(H)   HDL >40 mg/dL - 36(L) 69   Trlycerides <150 mg/dL - 68 105   Hemoglobin A1c <5.7 % of total Hgb 5.3 - -       Capillary Blood Glucose: No results found for: GLUCAP   Exercise Target Goals: Exercise Program Goal: Individual exercise prescription set using results from initial 6 min walk test and THRR while considering  patient's activity barriers and safety.   Exercise Prescription Goal: Initial exercise prescription builds to 30-45 minutes a day of aerobic activity, 2-3 days per week.  Home  exercise guidelines will be given to patient during program as part of exercise prescription that the participant will acknowledge.  Activity Barriers & Risk Stratification:  Activity Barriers & Cardiac Risk Stratification - 05/17/21 1200       Activity Barriers & Cardiac Risk Stratification   Activity Barriers Chest Pain/Angina;Back Problems;Balance Concerns;Deconditioning;History of Falls    Cardiac Risk Stratification High             6 Minute Walk:  6 Minute Walk     Row Name 05/17/21 1155         6 Minute Walk   Phase Initial     Distance 1169 feet     Walk Time 6 minutes     # of Rest Breaks 0     MPH 2.21     METS 3.64     RPE 11      Perceived Dyspnea  0     VO2 Peak 12.75     Symptoms Yes (comment)     Comments right leg fatigue/tightness and  2/10 chest tightness     Resting HR 59 bpm     Resting BP 150/87     Resting Oxygen Saturation  98 %     Exercise Oxygen Saturation  during 6 min walk 100 %     Max Ex. HR 1.84 bpm     Max Ex. BP 160/87     2 Minute Post BP 146/86              Oxygen Initial Assessment:   Oxygen Re-Evaluation:   Oxygen Discharge (Final Oxygen Re-Evaluation):   Initial Exercise Prescription:  Initial Exercise Prescription - 05/17/21 1200       Date of Initial Exercise RX and Referring Provider   Date 05/17/21    Referring Provider Dr. Kathlyn Sacramento MD    Expected Discharge Date 07/13/21      NuStep   Level 2    SPM 75    Minutes 30    METs 2      Prescription Details   Frequency (times per week) 3    Duration Progress to 30 minutes of continuous aerobic without signs/symptoms of physical distress      Intensity   THRR 40-80% of Max Heartrate 65-130    Ratings of Perceived Exertion 11-13    Perceived Dyspnea 0-4      Progression   Progression Continue progressive overload as per policy without signs/symptoms or physical distress.      Resistance Training   Training Prescription Yes    Weight 3    Reps 10-15             Perform Capillary Blood Glucose checks as needed.  Exercise Prescription Changes:   Exercise Comments:   Exercise Goals and Review:   Exercise Goals     Row Name 05/17/21 1203             Exercise Goals   Increase Physical Activity Yes       Intervention Provide advice, education, support and counseling about physical activity/exercise needs.;Develop an individualized exercise prescription for aerobic and resistive training based on initial evaluation findings, risk stratification, comorbidities and participant's personal goals.       Expected Outcomes Short Term: Attend rehab on a regular basis to increase amount of  physical activity.;Long Term: Add in home exercise to make exercise part of routine and to increase amount of physical activity.;Long Term: Exercising regularly at least 3-5 days a week.  Increase Strength and Stamina Yes       Intervention Provide advice, education, support and counseling about physical activity/exercise needs.;Develop an individualized exercise prescription for aerobic and resistive training based on initial evaluation findings, risk stratification, comorbidities and participant's personal goals.       Expected Outcomes Short Term: Increase workloads from initial exercise prescription for resistance, speed, and METs.;Short Term: Perform resistance training exercises routinely during rehab and add in resistance training at home;Long Term: Improve cardiorespiratory fitness, muscular endurance and strength as measured by increased METs and functional capacity (6MWT)       Able to understand and use rate of perceived exertion (RPE) scale Yes       Intervention Provide education and explanation on how to use RPE scale       Expected Outcomes Short Term: Able to use RPE daily in rehab to express subjective intensity level;Long Term:  Able to use RPE to guide intensity level when exercising independently       Knowledge and understanding of Target Heart Rate Range (THRR) Yes       Intervention Provide education and explanation of THRR including how the numbers were predicted and where they are located for reference       Expected Outcomes Short Term: Able to state/look up THRR;Long Term: Able to use THRR to govern intensity when exercising independently;Short Term: Able to use daily as guideline for intensity in rehab       Understanding of Exercise Prescription Yes       Intervention Provide education, explanation, and written materials on patient's individual exercise prescription       Expected Outcomes Short Term: Able to explain program exercise prescription;Long Term: Able to  explain home exercise prescription to exercise independently                Exercise Goals Re-Evaluation :   Discharge Exercise Prescription (Final Exercise Prescription Changes):   Nutrition:  Target Goals: Understanding of nutrition guidelines, daily intake of sodium 1500mg , cholesterol 200mg , calories 30% from fat and 7% or less from saturated fats, daily to have 5 or more servings of fruits and vegetables.  Biometrics:  Pre Biometrics - 05/17/21 1154       Pre Biometrics   Waist Circumference 41.25 inches    Hip Circumference 42 inches    Waist to Hip Ratio 0.98 %    Triceps Skinfold 14 mm    % Body Fat 26.3 %    Grip Strength 35 kg    Flexibility 11.5 in    Single Leg Stand 13.5 seconds              Nutrition Therapy Plan and Nutrition Goals:   Nutrition Assessments:  MEDIFICTS Score Key: ?70 Need to make dietary changes  40-70 Heart Healthy Diet ? 40 Therapeutic Level Cholesterol Diet    Picture Your Plate Scores: <39 Unhealthy dietary pattern with much room for improvement. 41-50 Dietary pattern unlikely to meet recommendations for good health and room for improvement. 51-60 More healthful dietary pattern, with some room for improvement.  >60 Healthy dietary pattern, although there may be some specific behaviors that could be improved.    Nutrition Goals Re-Evaluation:   Nutrition Goals Re-Evaluation:   Nutrition Goals Discharge (Final Nutrition Goals Re-Evaluation):   Psychosocial: Target Goals: Acknowledge presence or absence of significant depression and/or stress, maximize coping skills, provide positive support system. Participant is able to verbalize types and ability to use techniques and skills needed for reducing stress  and depression.  Initial Review & Psychosocial Screening:  Initial Psych Review & Screening - 05/17/21 1326       Family Dynamics   Comments lives with father in a "roommate" type of relationship.  They  sometimes get on each others nerves.      Screening Interventions   Interventions Encouraged to exercise;To provide support and resources with identified psychosocial needs;Provide feedback about the scores to participant    Expected Outcomes Long Term Goal: Stressors or current issues are controlled or eliminated.;Short Term goal: Identification and review with participant of any Quality of Life or Depression concerns found by scoring the questionnaire.;Long Term goal: The participant improves quality of Life and PHQ9 Scores as seen by post scores and/or verbalization of changes;Short Term goal: Utilizing psychosocial counselor, staff and physician to assist with identification of specific Stressors or current issues interfering with healing process. Setting desired goal for each stressor or current issue identified.             Quality of Life Scores:  Quality of Life - 05/17/21 1302       Quality of Life   Select Quality of Life      Quality of Life Scores   Health/Function Pre 7.9 %    Socioeconomic Pre 11.13 %    Psych/Spiritual Pre 10.5 %    Family Pre 10.8 %    GLOBAL Pre 9.75 %            Scores of 19 and below usually indicate a poorer quality of life in these areas.  A difference of  2-3 points is a clinically meaningful difference.  A difference of 2-3 points in the total score of the Quality of Life Index has been associated with significant improvement in overall quality of life, self-image, physical symptoms, and general health in studies assessing change in quality of life.  PHQ-9: Recent Review Flowsheet Data     Depression screen [facility] 2/9 05/17/2021 10/12/2020 09/29/2017   Decreased Interest 1 1 0   Down, Depressed, Hopeless 2 1 0   PHQ - 2 Score 3 2 0   Altered sleeping 2 0 -   Tired, decreased energy 1 2 -   Change in appetite 0 0 -   Feeling bad or failure about yourself  1 2 -   Trouble concentrating 1 2 -   Moving slowly or fidgety/restless 1 2 -    Suicidal thoughts 0 0 -   PHQ-9 Score 9 10 -   Difficult doing work/chores Somewhat difficult Somewhat difficult -      Interpretation of Total Score  Total Score Depression Severity:  1-4 = Minimal depression, 5-9 = Mild depression, 10-14 = Moderate depression, 15-19 = Moderately severe depression, 20-27 = Severe depression   Psychosocial Evaluation and Intervention:   Psychosocial Re-Evaluation:   Psychosocial Discharge (Final Psychosocial Re-Evaluation):   Vocational Rehabilitation: Provide vocational rehab assistance to qualifying candidates.   Vocational Rehab Evaluation & Intervention:  Vocational Rehab - 05/17/21 1235       Initial Vocational Rehab Evaluation & Intervention   Assessment shows need for Vocational Rehabilitation Yes    Vocational Rehab Packet given to patient 05/17/21      Vocational Rehab Re-Evaulation   Comments Ronalee Belts was given the packet of information to review to believe this is a resource for finding other employment he could do with his present health issues.             Education: Education Goals: Education  classes will be provided on a weekly basis, covering required topics. Participant will state understanding/return demonstration of topics presented.  Learning Barriers/Preferences:  Learning Barriers/Preferences - 05/17/21 1302       Learning Barriers/Preferences   Learning Barriers None    Learning Preferences Audio;Computer/Internet;Group Instruction;Individual Instruction;Pictoral;Skilled Demonstration;Verbal Instruction;Video;Written Material             Education Topics: Count Your Pulse:  -Group instruction provided by verbal instruction, demonstration, patient participation and written materials to support subject.  Instructors address importance of being able to find your pulse and how to count your pulse when at home without a heart monitor.  Patients get hands on experience counting their pulse with staff help and  individually.   Heart Attack, Angina, and Risk Factor Modification:  -Group instruction provided by verbal instruction, video, and written materials to support subject.  Instructors address signs and symptoms of angina and heart attacks.    Also discuss risk factors for heart disease and how to make changes to improve heart health risk factors.   Functional Fitness:  -Group instruction provided by verbal instruction, demonstration, patient participation, and written materials to support subject.  Instructors address safety measures for doing things around the house.  Discuss how to get up and down off the floor, how to pick things up properly, how to safely get out of a chair without assistance, and balance training.   Meditation and Mindfulness:  -Group instruction provided by verbal instruction, patient participation, and written materials to support subject.  Instructor addresses importance of mindfulness and meditation practice to help reduce stress and improve awareness.  Instructor also leads participants through a meditation exercise.    Stretching for Flexibility and Mobility:  -Group instruction provided by verbal instruction, patient participation, and written materials to support subject.  Instructors lead participants through series of stretches that are designed to increase flexibility thus improving mobility.  These stretches are additional exercise for major muscle groups that are typically performed during regular warm up and cool down.   Hands Only CPR:  -Group verbal, video, and participation provides a basic overview of AHA guidelines for community CPR. Role-play of emergencies allow participants the opportunity to practice calling for help and chest compression technique with discussion of AED use.   Hypertension: -Group verbal and written instruction that provides a basic overview of hypertension including the most recent diagnostic guidelines, risk factor reduction with  self-care instructions and medication management.    Nutrition I class: Heart Healthy Eating:  -Group instruction provided by PowerPoint slides, verbal discussion, and written materials to support subject matter. The instructor gives an explanation and review of the Therapeutic Lifestyle Changes diet recommendations, which includes a discussion on lipid goals, dietary fat, sodium, fiber, plant stanol/sterol esters, sugar, and the components of a well-balanced, healthy diet.   Nutrition II class: Lifestyle Skills:  -Group instruction provided by PowerPoint slides, verbal discussion, and written materials to support subject matter. The instructor gives an explanation and review of label reading, grocery shopping for heart health, heart healthy recipe modifications, and ways to make healthier choices when eating out.   Diabetes Question & Answer:  -Group instruction provided by PowerPoint slides, verbal discussion, and written materials to support subject matter. The instructor gives an explanation and review of diabetes co-morbidities, pre- and post-prandial blood glucose goals, pre-exercise blood glucose goals, signs, symptoms, and treatment of hypoglycemia and hyperglycemia, and foot care basics.   Diabetes Blitz:  -Group instruction provided by [employer], verbal discussion, and  written materials to support subject matter. The instructor gives an explanation and review of the physiology behind type 1 and type 2 diabetes, diabetes medications and rational behind using different medications, pre- and post-prandial blood glucose recommendations and Hemoglobin A1c goals, diabetes diet, and exercise including blood glucose guidelines for exercising safely.    Portion Distortion:  -Group instruction provided by PowerPoint slides, verbal discussion, written materials, and food models to support subject matter. The instructor gives an explanation of serving size versus portion size, changes in  portions sizes over the last 20 years, and what consists of a serving from each food group.   Stress Management:  -Group instruction provided by verbal instruction, video, and written materials to support subject matter.  Instructors review role of stress in heart disease and how to cope with stress positively.     Exercising on Your Own:  -Group instruction provided by verbal instruction, power point, and written materials to support subject.  Instructors discuss benefits of exercise, components of exercise, frequency and intensity of exercise, and end points for exercise.  Also discuss use of nitroglycerin and activating EMS.  Review options of places to exercise outside of rehab.  Review guidelines for sex with heart disease.   Cardiac Drugs I:  -Group instruction provided by verbal instruction and written materials to support subject.  Instructor reviews cardiac drug classes: antiplatelets, anticoagulants, beta blockers, and statins.  Instructor discusses reasons, side effects, and lifestyle considerations for each drug class.   Cardiac Drugs II:  -Group instruction provided by verbal instruction and written materials to support subject.  Instructor reviews cardiac drug classes: angiotensin converting enzyme inhibitors (ACE-I), angiotensin II receptor blockers (ARBs), nitrates, and calcium channel blockers.  Instructor discusses reasons, side effects, and lifestyle considerations for each drug class.   Anatomy and Physiology of the Circulatory System:  Group verbal and written instruction and models provide basic cardiac anatomy and physiology, with the coronary electrical and arterial systems. Review of: AMI, Angina, Valve disease, Heart Failure, Peripheral Artery Disease, Cardiac Arrhythmia, Pacemakers, and the ICD.   Other Education:  -Group or individual verbal, written, or video instructions that support the educational goals of the cardiac rehab program.   Holiday Eating Survival  Tips:  -Group instruction provided by PowerPoint slides, verbal discussion, and written materials to support subject matter. The instructor gives patients tips, tricks, and techniques to help them not only survive but enjoy the holidays despite the onslaught of food that accompanies the holidays.   Knowledge Questionnaire Score:  Knowledge Questionnaire Score - 05/17/21 1214       Knowledge Questionnaire Score   Pre Score 22/28             Core Components/Risk Factors/Patient Goals at Admission:  Personal Goals and Risk Factors at Admission - 05/17/21 1305       Core Components/Risk Factors/Patient Goals on Admission    Weight Management Yes    Tobacco Cessation Yes    Number of packs per day 1    Intervention Assist the participant in steps to quit. Provide individualized education and counseling about committing to Tobacco Cessation, relapse prevention, and pharmacological support that can be provided by physician.;Advice worker, assist with locating and accessing local/national Quit Smoking programs, and support quit date choice.    Expected Outcomes Short Term: Will demonstrate readiness to quit, by selecting a quit date.;Short Term: Will quit all tobacco product use, adhering to prevention of relapse plan.;Long Term: Complete abstinence from all tobacco products for at  least 12 months from quit date.    Heart Failure Yes    Intervention Provide a combined exercise and nutrition program that is supplemented with education, support and counseling about heart failure. Directed toward relieving symptoms such as shortness of breath, decreased exercise tolerance, and extremity edema.    Expected Outcomes Improve functional capacity of life;Short term: Attendance in program 2-3 days a week with increased exercise capacity. Reported lower sodium intake. Reported increased fruit and vegetable intake. Reports medication compliance.;Short term: Daily weights obtained and  reported for increase. Utilizing diuretic protocols set by physician.;Long term: Adoption of self-care skills and reduction of barriers for early signs and symptoms recognition and intervention leading to self-care maintenance.    Hypertension Yes    Intervention Provide education on lifestyle modifcations including regular physical activity/exercise, weight management, moderate sodium restriction and increased consumption of fresh fruit, vegetables, and low fat dairy, alcohol moderation, and smoking cessation.;Monitor prescription use compliance.    Expected Outcomes Short Term: Continued assessment and intervention until BP is < 140/64mm HG in hypertensive participants. < 130/62mm HG in hypertensive participants with diabetes, heart failure or chronic kidney disease.;Long Term: Maintenance of blood pressure at goal levels.    Lipids Yes    Intervention Provide education and support for participant on nutrition & aerobic/resistive exercise along with prescribed medications to achieve LDL 70mg , HDL >40mg .    Expected Outcomes Short Term: Participant states understanding of desired cholesterol values and is compliant with medications prescribed. Participant is following exercise prescription and nutrition guidelines.;Long Term: Cholesterol controlled with medications as prescribed, with individualized exercise RX and with personalized nutrition plan. Value goals: LDL < 70mg , HDL > 40 mg.    Stress Yes    Intervention Offer individual and/or small group education and counseling on adjustment to heart disease, stress management and health-related lifestyle change. Teach and support self-help strategies.;Refer participants experiencing significant psychosocial distress to appropriate mental health specialists for further evaluation and treatment. When possible, include family members and significant others in education/counseling sessions.    Expected Outcomes Short Term: Participant demonstrates changes in  health-related behavior, relaxation and other stress management skills, ability to obtain effective social support, and compliance with psychotropic medications if prescribed.;Long Term: Emotional wellbeing is indicated by absence of clinically significant psychosocial distress or social isolation.    Personal Goal Other Yes    Personal Goal short and long term: better health, enjoy life more, gain strength    Intervention Will continue to monitor pt and progress workloads as tolerated without sign or symptom    Expected Outcomes Pt will achieve his goals in a safe way             Core Components/Risk Factors/Patient Goals Review:    Core Components/Risk Factors/Patient Goals at Discharge (Final Review):    ITP Comments:  ITP Comments     Row Name 05/17/21 1149           ITP Comments Dr. Fransico Him MD, Medical Director                Comments: Patient attended orientation on 05/17/2021 to review rules and guidelines for program.  Completed 6 minute walk test, Intitial ITP, and exercise prescription.  VSS. Telemetry-SR/SB. Pt with complaint of fatigue behind his right knee. Safety measures and social distancing in place per CDC guidelines. Cherre Huger, BSN Cardiac and Training and development officer

## 2021-05-17 NOTE — Progress Notes (Signed)
Cardiac Rehab Medication Review by a Pharmacist  Does the patient  feel that his/her medications are working for him/her?  yes  Has the patient been experiencing any side effects to the medications prescribed?  no  Does the patient measure his/her own blood pressure or blood glucose at home?  no   Does the patient have any problems obtaining medications due to transportation or finances?   no  Understanding of regimen: fair Understanding of indications: fair Potential of compliance: good  Christopher Gutierrez does not have any financial issues getting his medications for now.  This may change if he unable to return to the workforce.  Christopher Gutierrez is compliant with his medications but does not have a good understanding of how they work - this is all "new" for him      Christopher Gutierrez Ruben Im RN 05/17/2021 1:28 PM

## 2021-05-21 ENCOUNTER — Encounter (HOSPITAL_COMMUNITY)
Admission: RE | Admit: 2021-05-21 | Discharge: 2021-05-21 | Disposition: A | Discharge status: Home / Self Care | Payer: 59 | Source: Ambulatory Visit | Attending: Cardiovascular Disease | Admitting: Cardiovascular Disease

## 2021-05-21 ENCOUNTER — Other Ambulatory Visit: Payer: Self-pay

## 2021-05-21 DIAGNOSIS — I5022 Chronic systolic (congestive) heart failure: Secondary | ICD-10-CM | POA: Diagnosis not present

## 2021-05-21 NOTE — Progress Notes (Signed)
Daily Session Note  Patient Details  Name: Christopher Gutierrez MRN: [id number] Date of Birth: May 18, 1961 Referring Provider:   Flowsheet Row CARDIAC REHAB PHASE II ORIENTATION from 05/17/2021 in [place]  Referring Provider Dr. Kathlyn Sacramento MD       Encounter Date: 05/21/2021  Check In:  Session Check In - 05/21/21 1330       Check-In   Supervising physician immediately available to respond to emergencies [facility] immediately available    Physician(s) Samtani    Location [facility]    Staff Present Esmeralda Links BS, ACSM EP-C, Exercise Physiologist;Carlette Wilber Oliphant, RN, Deland Pretty, MS, ACSM CEP, Exercise Physiologist;David Lilyan Punt, MS, ACSM-CEP, CCRP, Exercise Physiologist;Annedrea Rosezella [place], RN, MHA;Kian Ottaviano, RN, BSN    Virtual Visit No    Medication changes reported     No    Fall or balance concerns reported    No    Tobacco Cessation No Change   current smoker   Warm-up and Cool-down Performed on first and last piece of equipment    Resistance Training Performed Yes    VAD Patient? No    PAD/SET Patient? No      Pain Assessment   Currently in Pain? No/denies    Pain Score 0-No pain    Multiple Pain Sites No             Capillary Blood Glucose: No results found for this or any previous visit (from the past 24 hour(s)).   Exercise Prescription Changes - 05/21/21 1500       Response to Exercise   Blood Pressure (Admit) 140/76    Blood Pressure (Exercise) 142/88    Blood Pressure (Exit) 140/72    Heart Rate (Admit) 76 bpm    Heart Rate (Exercise) 84 bpm    Heart Rate (Exit) 62 bpm    Rating of Perceived Exertion (Exercise) 10    Symptoms None    Comments Pt's first day in the CRP2 program    Duration Continue with 30 min of aerobic exercise without signs/symptoms of physical distress.    Intensity THRR unchanged      Progression   Progression Continue to progress workloads  to maintain intensity without signs/symptoms of physical distress.    Average METs 1.8      Resistance Training   Training Prescription Yes    Weight 3    Reps 10-15    Time 10 Minutes      Interval Training   Interval Training No      NuStep   Level 2    SPM 75    Minutes 30    METs 1.8             Social History   Tobacco Use  Smoking Status Every Day   Packs/day: 1.50   Years: 25.00   Pack years: 37.50   Types: Cigarettes  Smokeless Tobacco Current   Types: Chew  Tobacco Comments   05/17/21 Christopher Gutierrez has cut down smoking to 1/2 to 1 ppd.  Not ready to quit.    Goals Met:  Exercise tolerated well No report of concerns or symptoms today Strength training completed today  Goals Unmet:  Not Applicable  Comments: Christopher Gutierrez  started cardiac rehab today.  Pt tolerated light exercise without difficulty. VSS, telemetry- Sinus Rhythm with intermittent PVC's. Will forward today's ECG tracings to Dr Tyrell Antonio office for review. , asymptomatic.  Medication list reconciled. Pt denies barriers to medicaiton compliance.  PSYCHOSOCIAL ASSESSMENT:  PHQ-9. Christopher Gutierrez says he is not currently depressed but does feel down at times regarding his health and CHF diagnosis.    Pt enjoys reading, gaming and sports.   Pt oriented to exercise equipment and routine.    Understanding verbalized. Discussed smoking cessation. Christopher Gutierrez reports that he is not ready to quit right now. Patient has the 1-800-quit-now number to call when he is ready. QUALITY OF LIFE SCORE REVIEW  Pt completed Quality of Life survey as a participant in Cardiac Rehab.  Scores 21.0 or below are considered low.  Pt score very low in several areas Overall 9.57 , Health and Function 7.90, socioeconomic 11.13, physiological and spiritual 10.50, family 10.80. Patient quality of life slightly altered by physical constraints which limits ability to perform as prior to recent cardiac illness.Christopher Gutierrez admits to having low stamina and is dissatisfied with  his health due to his CHF diagnosis. Christopher Gutierrez also says he is not currently working as he is on short term disability.   Offered emotional support and reassurance.  Will continue to monitor and intervene as necessary. Christopher Gutierrez does not want his  quality of life questionnaire forwarded to his primary care provider Dr [name] at this time. Christopher Gutierrez denies the need for counseling at this time.Barnet Pall, RN,BSN 05/22/2021 8:22 AM    Dr. Fransico Him is Medical Director for Cardiac Rehab at [facility].

## 2021-05-23 ENCOUNTER — Encounter (HOSPITAL_COMMUNITY)
Admission: RE | Admit: 2021-05-23 | Discharge: 2021-05-23 | Disposition: A | Discharge status: Home / Self Care | Payer: 59 | Source: Ambulatory Visit | Attending: Cardiovascular Disease | Admitting: Cardiovascular Disease

## 2021-05-23 ENCOUNTER — Other Ambulatory Visit: Payer: Self-pay

## 2021-05-23 DIAGNOSIS — I5022 Chronic systolic (congestive) heart failure: Secondary | ICD-10-CM

## 2021-05-25 ENCOUNTER — Encounter (HOSPITAL_COMMUNITY)
Admission: RE | Admit: 2021-05-25 | Discharge: 2021-05-25 | Disposition: A | Discharge status: Home / Self Care | Payer: 59 | Source: Ambulatory Visit | Attending: Cardiovascular Disease | Admitting: Cardiovascular Disease

## 2021-05-25 ENCOUNTER — Other Ambulatory Visit: Payer: Self-pay

## 2021-05-25 DIAGNOSIS — I5022 Chronic systolic (congestive) heart failure: Secondary | ICD-10-CM

## 2021-05-28 ENCOUNTER — Inpatient Hospital Stay: Payer: 59 | Attending: Oncology | Admitting: Oncology

## 2021-05-28 ENCOUNTER — Inpatient Hospital Stay: Payer: 59

## 2021-05-28 ENCOUNTER — Other Ambulatory Visit: Payer: Self-pay

## 2021-05-28 VITALS — BP 157/87 | HR 65 | Temp 97.8°F | Resp 18 | Ht 73.0 in | Wt 198.0 lb

## 2021-05-28 DIAGNOSIS — D751 Secondary polycythemia: Secondary | ICD-10-CM

## 2021-05-28 DIAGNOSIS — I509 Heart failure, unspecified: Secondary | ICD-10-CM | POA: Diagnosis not present

## 2021-05-28 DIAGNOSIS — F17210 Nicotine dependence, cigarettes, uncomplicated: Secondary | ICD-10-CM | POA: Insufficient documentation

## 2021-05-28 DIAGNOSIS — I4891 Unspecified atrial fibrillation: Secondary | ICD-10-CM | POA: Diagnosis not present

## 2021-05-28 DIAGNOSIS — I110 Hypertensive heart disease with heart failure: Secondary | ICD-10-CM | POA: Insufficient documentation

## 2021-05-28 LAB — CBC WITH DIFFERENTIAL (CANCER CENTER ONLY)
Abs Immature Granulocytes: 0.05 10*3/uL (ref 0.00–0.07)
Basophils Absolute: 0 10*3/uL (ref 0.0–0.1)
Basophils Relative: 1 %
Eosinophils Absolute: 0.1 10*3/uL (ref 0.0–0.5)
Eosinophils Relative: 1 %
HCT: 50.2 % (ref 39.0–52.0)
Hemoglobin: 16.9 g/dL (ref 13.0–17.0)
Immature Granulocytes: 1 %
Lymphocytes Relative: 13 %
Lymphs Abs: 1.1 10*3/uL (ref 0.7–4.0)
MCH: 33.8 pg (ref 26.0–34.0)
MCHC: 33.7 g/dL (ref 30.0–36.0)
MCV: 100.4 fL — ABNORMAL HIGH (ref 80.0–100.0)
Monocytes Absolute: 0.5 10*3/uL (ref 0.1–1.0)
Monocytes Relative: 7 %
Neutro Abs: 6.5 10*3/uL (ref 1.7–7.7)
Neutrophils Relative %: 77 %
Platelet Count: 159 10*3/uL (ref 150–400)
RBC: 5 MIL/uL (ref 4.22–5.81)
RDW: 12 % (ref 11.5–15.5)
WBC Count: 8.3 10*3/uL (ref 4.0–10.5)
nRBC: 0 % (ref 0.0–0.2)

## 2021-05-28 NOTE — Progress Notes (Signed)
  Christopher Gutierrez OFFICE PROGRESS NOTE   Diagnosis: Erythrocytosis  INTERVAL HISTORY:   Christopher Gutierrez returns as scheduled.  We saw him in April to evaluate erythrocytosis.  We felt the erythrocytosis was likely related to cardiopulmonary disease.  A JAK2 mutation returned negative.  Christopher Gutierrez continues smoking.  He reports improvement in left leg discomfort after undergoing left leg stent placement in August.   No thrombosis.  Objective:  Vital signs in last 24 hours:  Blood pressure (!) 157/87, pulse 65, temperature 97.8 F (36.6 C), temperature source Oral, resp. rate 18, height 6\' 1"  (1.854 m), weight 198 lb (89.8 kg), SpO2 100 %.    Resp: Scattered bronchial sounds, no respiratory distress Cardio: Regular rate and rhythm GI: No hepatosplenomegaly Vascular: No leg edema, left lower leg is slightly larger than the right side  Lab Results:  Lab Results  Component Value Date   WBC 8.3 05/28/2021   HGB 16.9 05/28/2021   HCT 50.2 05/28/2021   MCV 100.4 (H) 05/28/2021   PLT 159 05/28/2021   NEUTROABS 6.5 05/28/2021    CMP  Lab Results  Component Value Date   NA 138 04/30/2021   K 3.7 04/30/2021   CL 106 04/30/2021   CO2 24 04/30/2021   GLUCOSE 102 (H) 04/30/2021   BUN 12 04/30/2021   CREATININE 1.03 04/30/2021   CALCIUM 9.1 04/30/2021   PROT 6.1 (L) 10/30/2020   ALBUMIN 3.6 10/30/2020   AST 30 10/30/2020   ALT 30 10/30/2020   ALKPHOS 83 10/30/2020   BILITOT 4.0 (H) 10/30/2020   GFRNONAA >60 04/30/2021     Medications: I have reviewed the patient's current medications.   Assessment/Plan: Erythrocytosis, likely secondary  Hypertension Atrial fibrillation CHF Alcohol and tobacco use    Disposition: Christopher Gutierrez is stable from a hematologic standpoint.  He has stable mild erythrocytosis, likely related to cardiovascular disease, ongoing tobacco use, and potentially Aldactone.  I have a low suspicion for a myeloproliferative disorder.  He will  return for an office visit and CBC in 9 months.  The mild elevation of the MCV is likely related to alcohol use.  We will ask him to return for a vitamin B12 level.  I referred him to the lung cancer screening clinic given his history of longstanding tobacco use.  Betsy Coder, MD  05/28/2021  3:26 PM

## 2021-05-30 ENCOUNTER — Other Ambulatory Visit: Payer: Self-pay

## 2021-05-30 ENCOUNTER — Telehealth: Payer: Self-pay | Admitting: Oncology

## 2021-05-30 ENCOUNTER — Encounter (HOSPITAL_COMMUNITY)
Admission: RE | Admit: 2021-05-30 | Discharge: 2021-05-30 | Disposition: A | Discharge status: Home / Self Care | Payer: 59 | Source: Ambulatory Visit | Attending: Cardiovascular Disease | Admitting: Cardiovascular Disease

## 2021-05-30 DIAGNOSIS — I5022 Chronic systolic (congestive) heart failure: Secondary | ICD-10-CM

## 2021-05-30 NOTE — Telephone Encounter (Signed)
Scheduled appt per 10/24 sch msg - called patient - no answer. Left message for patient with appt date and time

## 2021-06-01 ENCOUNTER — Encounter (HOSPITAL_COMMUNITY)
Admission: RE | Admit: 2021-06-01 | Discharge: 2021-06-01 | Disposition: A | Discharge status: Home / Self Care | Payer: 59 | Source: Ambulatory Visit | Attending: Cardiovascular Disease | Admitting: Cardiovascular Disease

## 2021-06-01 ENCOUNTER — Other Ambulatory Visit: Payer: Self-pay

## 2021-06-01 DIAGNOSIS — I5022 Chronic systolic (congestive) heart failure: Secondary | ICD-10-CM

## 2021-06-04 ENCOUNTER — Telehealth (HOSPITAL_COMMUNITY): Payer: Self-pay | Admitting: *Deleted

## 2021-06-04 ENCOUNTER — Encounter (HOSPITAL_COMMUNITY): Payer: 59

## 2021-06-04 NOTE — Telephone Encounter (Signed)
Bailen called to report that he will not be attending exercise at cardiac rehab.Harrell Gave RN BSN

## 2021-06-05 NOTE — Progress Notes (Signed)
Cardiac Individual Treatment Plan  Patient Details  Name: Christopher Gutierrez MRN: [id number] Date of Birth: [birth year] Referring Provider:   Flowsheet Row CARDIAC REHAB PHASE II ORIENTATION from 05/17/2021 in [facility]  Referring Provider Dr. Kathlyn Sacramento MD       Initial Encounter Date:  [facility] from 05/17/2021 in [place]  Date 05/17/21       Visit Diagnosis: Heart failure, chronic systolic ([place])  Patient's Home Medications on Admission:  Current Outpatient Medications:    acetaminophen (TYLENOL) 325 MG tablet, Take 650 mg by mouth every 6 (six) hours as needed for moderate pain or headache., Disp: , Rfl:    bisoprolol (ZEBETA) 5 MG tablet, Take 0.5 tablets (2.5 mg total) by mouth daily., Disp: 45 tablet, Rfl: 3   cetirizine (ZYRTEC) 10 MG tablet, Take 10 mg by mouth as needed for allergies., Disp: , Rfl:    clopidogrel (PLAVIX) 75 MG tablet, Take 1 tablet (75 mg total) by mouth daily., Disp: 30 tablet, Rfl: 6   dapagliflozin propanediol (FARXIGA) 10 MG TABS tablet, Take 1 tablet (10 mg total) by mouth daily before breakfast., Disp: 30 tablet, Rfl: 4   ELIQUIS 5 MG TABS tablet, TAKE 1 TABLET BY MOUTH TWICE A DAY, Disp: 60 tablet, Rfl: 3   Famotidine-Ca Carb-Mag Hydrox (PEPCID COMPLETE PO), Take 1 tablet by mouth daily as needed (acid reflux)., Disp: , Rfl:    Nicotine 10 MG/ML SOLN, 1-2 puffs as needed, Disp: 10 mL, Rfl: 2   nicotine polacrilex (COMMIT) 4 MG lozenge, Take 4 mg by mouth as needed for smoking cessation., Disp: , Rfl:    rosuvastatin (CRESTOR) 20 MG tablet, Take 1 tablet (20 mg total) by mouth daily., Disp: 30 tablet, Rfl: 6   sacubitril-valsartan (ENTRESTO) 97-103 MG, Take 1 tablet by mouth 2 (two) times daily., Disp: 60 tablet, Rfl: 6   spironolactone (ALDACTONE) 25 MG tablet, Take 0.5 tablets (12.5 mg total) by mouth daily., Disp: 15 tablet, Rfl:  5  Past Medical History: Past Medical History:  Diagnosis Date   Atrial fibrillation ([place]) 10/24/2020   Atrial fibrillation and flutter ([facility])    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  Not ready to quit.    Labs: Recent Review Flowsheet Data     Labs for ITP Cardiac and Pulmonary Rehab Latest Ref Rng & Units 10/12/2020 10/31/2020 01/26/2021   Cholestrol 0 - 200 mg/dL - 153 229(H)   LDLCALC 0 - 99 mg/dL - 103(H) 139(H)   HDL >40 mg/dL - 36(L) 69   Trlycerides <150 mg/dL - 68 105   Hemoglobin A1c <5.7 % of total Hgb 5.3 - -       Capillary Blood Glucose: No results found for: GLUCAP   Exercise Target Goals: Exercise Program Goal: Individual exercise prescription set using results from initial 6 min walk test and THRR while considering  patient's activity barriers and safety.   Exercise Prescription Goal: Starting with aerobic activity 30 plus minutes a day, 3 days per week for initial exercise prescription. Provide  home exercise prescription and guidelines that participant acknowledges understanding prior to discharge.  Activity Barriers & Risk Stratification:  Activity Barriers & Cardiac Risk Stratification - 05/17/21 1200       Activity Barriers & Cardiac Risk Stratification   Activity Barriers Chest Pain/Angina;Back Problems;Balance Concerns;Deconditioning;History of Falls    Cardiac Risk Stratification High             6 Minute Walk:  6 Minute Walk     Row Name 05/17/21 1155         6 Minute Walk   Phase Initial     Distance 1169 feet     Walk Time 6 minutes     # of Rest Breaks 0     MPH 2.21     METS 3.64     RPE 11     Perceived Dyspnea  0      VO2 Peak 12.75     Symptoms Yes (comment)     Comments right leg fatigue/tightness and  2/10 chest tightness     Resting HR 59 bpm     Resting BP 150/87     Resting Oxygen Saturation  98 %     Exercise Oxygen Saturation  during 6 min walk 100 %     Max Ex. HR 1.84 bpm     Max Ex. BP 160/87     2 Minute Post BP 146/86              Oxygen Initial Assessment:   Oxygen Re-Evaluation:   Oxygen Discharge (Final Oxygen Re-Evaluation):   Initial Exercise Prescription:  Initial Exercise Prescription - 05/17/21 1200       Date of Initial Exercise RX and Referring Provider   Date 05/17/21    Referring Provider Dr. Kathlyn Sacramento MD    Expected Discharge Date 07/13/21      NuStep   Level 2    SPM 75    Minutes 30    METs 2      Prescription Details   Frequency (times per week) 3    Duration Progress to 30 minutes of continuous aerobic without signs/symptoms of physical distress      Intensity   THRR 40-80% of Max Heartrate 65-130    Ratings of Perceived Exertion 11-13    Perceived Dyspnea 0-4      Progression   Progression Continue progressive overload as per policy without signs/symptoms or physical distress.      Resistance Training   Training Prescription Yes    Weight 3    Reps 10-15             Perform Capillary Blood Glucose checks as needed.  Exercise Prescription Changes:   Exercise Prescription Changes     Row Name 05/21/21 1500             Response to Exercise   Blood Pressure (Admit) 140/76       Blood Pressure (Exercise) 142/88       Blood Pressure (Exit) 140/72       Heart Rate (Admit) 76 bpm       Heart Rate (Exercise) 84 bpm       Heart Rate (Exit) 62 bpm       Rating of Perceived Exertion (Exercise) 10       Symptoms None       Comments Pt's first day in the CRP2 program       Duration Continue with 30 min of aerobic exercise without signs/symptoms  of physical distress.       Intensity THRR unchanged         Progression    Progression Continue to progress workloads to maintain intensity without signs/symptoms of physical distress.       Average METs 1.8         Resistance Training   Training Prescription Yes       Weight 3       Reps 10-15       Time 10 Minutes         Interval Training   Interval Training No         NuStep   Level 2       SPM 75       Minutes 30       METs 1.8                Exercise Comments:   Exercise Comments     Row Name 05/21/21 1500           Exercise Comments Pt completed the session today without compliants. Off to a good start.                Exercise Goals and Review:   Exercise Goals     Row Name 05/17/21 1203             Exercise Goals   Increase Physical Activity Yes       Intervention Provide advice, education, support and counseling about physical activity/exercise needs.;Develop an individualized exercise prescription for aerobic and resistive training based on initial evaluation findings, risk stratification, comorbidities and participant's personal goals.       Expected Outcomes Short Term: Attend rehab on a regular basis to increase amount of physical activity.;Long Term: Add in home exercise to make exercise part of routine and to increase amount of physical activity.;Long Term: Exercising regularly at least 3-5 days a week.       Increase Strength and Stamina Yes       Intervention Provide advice, education, support and counseling about physical activity/exercise needs.;Develop an individualized exercise prescription for aerobic and resistive training based on initial evaluation findings, risk stratification, comorbidities and participant's personal goals.       Expected Outcomes Short Term: Increase workloads from initial exercise prescription for resistance, speed, and METs.;Short Term: Perform resistance training exercises routinely during rehab and add in resistance training at home;Long Term: Improve cardiorespiratory fitness,  muscular endurance and strength as measured by increased METs and functional capacity (6MWT)       Able to understand and use rate of perceived exertion (RPE) scale Yes       Intervention Provide education and explanation on how to use RPE scale       Expected Outcomes Short Term: Able to use RPE daily in rehab to express subjective intensity level;Long Term:  Able to use RPE to guide intensity level when exercising independently       Knowledge and understanding of Target Heart Rate Range (THRR) Yes       Intervention Provide education and explanation of THRR including how the numbers were predicted and where they are located for reference       Expected Outcomes Short Term: Able to state/look up THRR;Long Term: Able to use THRR to govern intensity when exercising independently;Short Term: Able to use daily as guideline for intensity in rehab       Understanding of Exercise Prescription Yes       Intervention Provide  education, explanation, and written materials on patient's individual exercise prescription       Expected Outcomes Short Term: Able to explain program exercise prescription;Long Term: Able to explain home exercise prescription to exercise independently                Exercise Goals Re-Evaluation :  Exercise Goals Re-Evaluation     Row Name 05/21/21 1500             Exercise Goal Re-Evaluation   Exercise Goals Review Increase Physical Activity;Increase Strength and Stamina;Able to understand and use rate of perceived exertion (RPE) scale;Knowledge and understanding of Target Heart Rate Range (THRR);Understanding of Exercise Prescription       Comments Pt's first day in the CRP2 program. Pt understands the exercise Rx, THRR, and RPE scale.       Expected Outcomes Will continue to monitor patient and progress exercise workloads as tolerated.                 Discharge Exercise Prescription (Final Exercise Prescription Changes):  Exercise Prescription Changes -  05/21/21 1500       Response to Exercise   Blood Pressure (Admit) 140/76    Blood Pressure (Exercise) 142/88    Blood Pressure (Exit) 140/72    Heart Rate (Admit) 76 bpm    Heart Rate (Exercise) 84 bpm    Heart Rate (Exit) 62 bpm    Rating of Perceived Exertion (Exercise) 10    Symptoms None    Comments Pt's first day in the CRP2 program    Duration Continue with 30 min of aerobic exercise without signs/symptoms of physical distress.    Intensity THRR unchanged      Progression   Progression Continue to progress workloads to maintain intensity without signs/symptoms of physical distress.    Average METs 1.8      Resistance Training   Training Prescription Yes    Weight 3    Reps 10-15    Time 10 Minutes      Interval Training   Interval Training No      NuStep   Level 2    SPM 75    Minutes 30    METs 1.8             Nutrition:  Target Goals: Understanding of nutrition guidelines, daily intake of sodium 1500mg , cholesterol 200mg , calories 30% from fat and 7% or less from saturated fats, daily to have 5 or more servings of fruits and vegetables.  Biometrics:  Pre Biometrics - 05/17/21 1154       Pre Biometrics   Waist Circumference 41.25 inches    Hip Circumference 42 inches    Waist to Hip Ratio 0.98 %    Triceps Skinfold 14 mm    % Body Fat 26.3 %    Grip Strength 35 kg    Flexibility 11.5 in    Single Leg Stand 13.5 seconds              Nutrition Therapy Plan and Nutrition Goals:   Nutrition Assessments:  MEDIFICTS Score Key: ?70 Need to make dietary changes  40-70 Heart Healthy Diet ? 40 Therapeutic Level Cholesterol Diet   Picture Your Plate Scores: <16 Unhealthy dietary pattern with much room for improvement. 41-50 Dietary pattern unlikely to meet recommendations for good health and room for improvement. 51-60 More healthful dietary pattern, with some room for improvement.  >60 Healthy dietary pattern, although there may be some  specific behaviors that could  be improved.    Nutrition Goals Re-Evaluation:   Nutrition Goals Discharge (Final Nutrition Goals Re-Evaluation):   Psychosocial: Target Goals: Acknowledge presence or absence of significant depression and/or stress, maximize coping skills, provide positive support system. Participant is able to verbalize types and ability to use techniques and skills needed for reducing stress and depression.  Initial Review & Psychosocial Screening:  Initial Psych Review & Screening - 05/17/21 1326       Family Dynamics   Comments lives with father in a "roommate" type of relationship.  They sometimes get on each others nerves.      Screening Interventions   Interventions Encouraged to exercise;To provide support and resources with identified psychosocial needs;Provide feedback about the scores to participant    Expected Outcomes Long Term Goal: Stressors or current issues are controlled or eliminated.;Short Term goal: Identification and review with participant of any Quality of Life or Depression concerns found by scoring the questionnaire.;Long Term goal: The participant improves quality of Life and PHQ9 Scores as seen by post scores and/or verbalization of changes;Short Term goal: Utilizing psychosocial counselor, staff and physician to assist with identification of specific Stressors or current issues interfering with healing process. Setting desired goal for each stressor or current issue identified.             Quality of Life Scores:  Quality of Life - 05/17/21 1302       Quality of Life   Select Quality of Life      Quality of Life Scores   Health/Function Pre 7.9 %    Socioeconomic Pre 11.13 %    Psych/Spiritual Pre 10.5 %    Family Pre 10.8 %    GLOBAL Pre 9.75 %            Scores of 19 and below usually indicate a poorer quality of life in these areas.  A difference of  2-3 points is a clinically meaningful difference.  A difference of 2-3  points in the total score of the Quality of Life Index has been associated with significant improvement in overall quality of life, self-image, physical symptoms, and general health in studies assessing change in quality of life.  PHQ-9: Recent Review Flowsheet Data     Depression screen [facility] 2/9 05/17/2021 10/12/2020 09/29/2017   Decreased Interest 1 1 0   Down, Depressed, Hopeless 2 1 0   PHQ - 2 Score 3 2 0   Altered sleeping 2 0 -   Tired, decreased energy 1 2 -   Change in appetite 0 0 -   Feeling bad or failure about yourself  1 2 -   Trouble concentrating 1 2 -   Moving slowly or fidgety/restless 1 2 -   Suicidal thoughts 0 0 -   PHQ-9 Score 9 10 -   Difficult doing work/chores Somewhat difficult Somewhat difficult -      Interpretation of Total Score  Total Score Depression Severity:  1-4 = Minimal depression, 5-9 = Mild depression, 10-14 = Moderate depression, 15-19 = Moderately severe depression, 20-27 = Severe depression   Psychosocial Evaluation and Intervention:   Psychosocial Re-Evaluation:  Psychosocial Re-Evaluation     [place] Name 06/05/21 1620             Psychosocial Re-Evaluation   Current issues with Current Stress Concerns;Current Sleep Concerns       Comments Christopher Gutierrez has not voiced any increased concerns and stressors although the patient still has ongoing financial concerns. Quality of life  questionnaire was reviewed. Christopher Gutierrez did not want his QOL forwarded to his primary care at this time.       Expected Outcomes Christopher Gutierrez will have decreased stressors/ concerns upon completion of phase 2 cardiac rehab       Interventions Encouraged to attend Cardiac Rehabilitation for the exercise;Stress management education;Relaxation education       Continue Psychosocial Services  Follow up required by staff         Initial Review   Source of Stress Concerns Occupation;Unable to participate in former interests or hobbies;Financial;Unable to perform yard/household  activities       Comments Will continue to monitor and offer support as needed.                Psychosocial Discharge (Final Psychosocial Re-Evaluation):  Psychosocial Re-Evaluation - 06/05/21 1620       Psychosocial Re-Evaluation   Current issues with Current Stress Concerns;Current Sleep Concerns    Comments Christopher Gutierrez has not voiced any increased concerns and stressors although the patient still has ongoing financial concerns. Quality of life questionnaire was reviewed. Christopher Gutierrez did not want his QOL forwarded to his primary care at this time.    Expected Outcomes Christopher Gutierrez will have decreased stressors/ concerns upon completion of phase 2 cardiac rehab    Interventions Encouraged to attend Cardiac Rehabilitation for the exercise;Stress management education;Relaxation education    Continue Psychosocial Services  Follow up required by staff      Initial Review   Source of Stress Concerns Occupation;Unable to participate in former interests or hobbies;Financial;Unable to perform yard/household activities    Comments Will continue to monitor and offer support as needed.             Vocational Rehabilitation: Provide vocational rehab assistance to qualifying candidates.   Vocational Rehab Evaluation & Intervention:  Vocational Rehab - 05/17/21 1235       Initial Vocational Rehab Evaluation & Intervention   Assessment shows need for Vocational Rehabilitation Yes    Vocational Rehab Packet given to patient 05/17/21      Vocational Rehab Re-Evaulation   Comments Christopher Gutierrez was given the packet of information to review to believe this is a resource for finding other employment he could do with his present health issues.             Education: Education Goals: Education classes will be provided on a weekly basis, covering required topics. Participant will state understanding/return demonstration of topics presented.  Learning Barriers/Preferences:  Learning Barriers/Preferences -  05/17/21 1302       Learning Barriers/Preferences   Learning Barriers None    Learning Preferences Audio;Computer/Internet;Group Instruction;Individual Instruction;Pictoral;Skilled Demonstration;Verbal Instruction;Video;Written Material             Education Topics: Hypertension, Hypertension Reduction -Define heart disease and high blood pressure. Discus how high blood pressure affects the body and ways to reduce high blood pressure.   Exercise and Your Heart -Discuss why it is important to exercise, the FITT principles of exercise, normal and abnormal responses to exercise, and how to exercise safely.   Angina -Discuss definition of angina, causes of angina, treatment of angina, and how to decrease risk of having angina.   Cardiac Medications -Review what the following cardiac medications are used for, how they affect the body, and side effects that may occur when taking the medications.  Medications include Aspirin, Beta blockers, calcium channel blockers, ACE Inhibitors, angiotensin receptor blockers, diuretics, digoxin, and antihyperlipidemics.   Congestive Heart Failure -Discuss the definition of  CHF, how to live with CHF, the signs and symptoms of CHF, and how keep track of weight and sodium intake.   Heart Disease and Intimacy -Discus the effect sexual activity has on the heart, how changes occur during intimacy as we age, and safety during sexual activity.   Smoking Cessation / COPD -Discuss different methods to quit smoking, the health benefits of quitting smoking, and the definition of COPD.   Nutrition I: Fats -Discuss the types of cholesterol, what cholesterol does to the heart, and how cholesterol levels can be controlled.   Nutrition II: Labels -Discuss the different components of food labels and how to read food label   Heart Parts/Heart Disease and PAD -Discuss the anatomy of the heart, the pathway of blood circulation through the heart, and these  are affected by heart disease.   Stress I: Signs and Symptoms -Discuss the causes of stress, how stress may lead to anxiety and depression, and ways to limit stress.   Stress II: Relaxation -Discuss different types of relaxation techniques to limit stress.   Warning Signs of Stroke / TIA -Discuss definition of a stroke, what the signs and symptoms are of a stroke, and how to identify when someone is having stroke.   Knowledge Questionnaire Score:  Knowledge Questionnaire Score - 05/17/21 1214       Knowledge Questionnaire Score   Pre Score 22/28             Core Components/Risk Factors/Patient Goals at Admission:  Personal Goals and Risk Factors at Admission - 05/17/21 1305       Core Components/Risk Factors/Patient Goals on Admission    Weight Management Yes    Tobacco Cessation Yes    Number of packs per day 1    Intervention Assist the participant in steps to quit. Provide individualized education and counseling about committing to Tobacco Cessation, relapse prevention, and pharmacological support that can be provided by physician.;Advice worker, assist with locating and accessing local/national Quit Smoking programs, and support quit date choice.    Expected Outcomes Short Term: Will demonstrate readiness to quit, by selecting a quit date.;Short Term: Will quit all tobacco product use, adhering to prevention of relapse plan.;Long Term: Complete abstinence from all tobacco products for at least 12 months from quit date.    Heart Failure Yes    Intervention Provide a combined exercise and nutrition program that is supplemented with education, support and counseling about heart failure. Directed toward relieving symptoms such as shortness of breath, decreased exercise tolerance, and extremity edema.    Expected Outcomes Improve functional capacity of life;Short term: Attendance in program 2-3 days a week with increased exercise capacity. Reported lower sodium  intake. Reported increased fruit and vegetable intake. Reports medication compliance.;Short term: Daily weights obtained and reported for increase. Utilizing diuretic protocols set by physician.;Long term: Adoption of self-care skills and reduction of barriers for early signs and symptoms recognition and intervention leading to self-care maintenance.    Hypertension Yes    Intervention Provide education on lifestyle modifcations including regular physical activity/exercise, weight management, moderate sodium restriction and increased consumption of fresh fruit, vegetables, and low fat dairy, alcohol moderation, and smoking cessation.;Monitor prescription use compliance.    Expected Outcomes Short Term: Continued assessment and intervention until BP is < 140/14mm HG in hypertensive participants. < 130/45mm HG in hypertensive participants with diabetes, heart failure or chronic kidney disease.;Long Term: Maintenance of blood pressure at goal levels.    Lipids Yes    Intervention  Provide education and support for participant on nutrition & aerobic/resistive exercise along with prescribed medications to achieve LDL 70mg , HDL >40mg .    Expected Outcomes Short Term: Participant states understanding of desired cholesterol values and is compliant with medications prescribed. Participant is following exercise prescription and nutrition guidelines.;Long Term: Cholesterol controlled with medications as prescribed, with individualized exercise RX and with personalized nutrition plan. Value goals: LDL < 70mg , HDL > 40 mg.    Stress Yes    Intervention Offer individual and/or small group education and counseling on adjustment to heart disease, stress management and health-related lifestyle change. Teach and support self-help strategies.;Refer participants experiencing significant psychosocial distress to appropriate mental health specialists for further evaluation and treatment. When possible, include family members and  significant others in education/counseling sessions.    Expected Outcomes Short Term: Participant demonstrates changes in health-related behavior, relaxation and other stress management skills, ability to obtain effective social support, and compliance with psychotropic medications if prescribed.;Long Term: Emotional wellbeing is indicated by absence of clinically significant psychosocial distress or social isolation.    Personal Goal Other Yes    Personal Goal short and long term: better health, enjoy life more, gain strength    Intervention Will continue to monitor pt and progress workloads as tolerated without sign or symptom    Expected Outcomes Pt will achieve his goals in a safe way             Core Components/Risk Factors/Patient Goals Review:   Goals and Risk Factor Review     Row Name 06/05/21 1625             Core Components/Risk Factors/Patient Goals Review   Personal Goals Review Weight Management/Obesity;Heart Failure;Stress;Hypertension;Lipids       Review Hridhaan has done well with exercise at cardiac rehab when in attendance. Vital signs have been stable. [place]anto continues to smoke and does not plan to stop currently       Expected Outcomes Darryel will continue to participate in phase 2 cardiac rehab for exercise, nutrition and lifestyle modifications.                Core Components/Risk Factors/Patient Goals at Discharge (Final Review):   Goals and Risk Factor Review - 06/05/21 1625       Core Components/Risk Factors/Patient Goals Review   Personal Goals Review Weight Management/Obesity;Heart Failure;Stress;Hypertension;Lipids    Review Christopher Gutierrez has done well with exercise at cardiac rehab when in attendance. Vital signs have been stable. Christopher Gutierrez continues to smoke and does not plan to stop currently    Expected Outcomes Christopher Gutierrez will continue to participate in phase 2 cardiac rehab for exercise, nutrition and lifestyle modifications.             ITP  Comments:  ITP Comments     Row Name 05/17/21 1149 06/05/21 1619         ITP Comments Dr. Fransico Him MD, Medical Director 30 Day ITP Review. Joann has good participation in phase 2 cardiac rehab.               Comments: See ITP comments.Christopher Gave RN BSN

## 2021-06-06 ENCOUNTER — Other Ambulatory Visit: Payer: Self-pay

## 2021-06-06 ENCOUNTER — Encounter (HOSPITAL_COMMUNITY)
Admission: RE | Admit: 2021-06-06 | Discharge: 2021-06-06 | Disposition: A | Discharge status: Home / Self Care | Payer: 59 | Source: Ambulatory Visit | Attending: Cardiovascular Disease | Admitting: Cardiovascular Disease

## 2021-06-06 DIAGNOSIS — I5022 Chronic systolic (congestive) heart failure: Secondary | ICD-10-CM | POA: Insufficient documentation

## 2021-06-08 ENCOUNTER — Encounter (HOSPITAL_COMMUNITY)
Admission: RE | Admit: 2021-06-08 | Discharge: 2021-06-08 | Disposition: A | Discharge status: Home / Self Care | Payer: 59 | Source: Ambulatory Visit | Attending: Cardiovascular Disease | Admitting: Cardiovascular Disease

## 2021-06-08 ENCOUNTER — Other Ambulatory Visit: Payer: Self-pay

## 2021-06-08 DIAGNOSIS — I5022 Chronic systolic (congestive) heart failure: Secondary | ICD-10-CM | POA: Diagnosis not present

## 2021-06-11 ENCOUNTER — Other Ambulatory Visit: Payer: Self-pay

## 2021-06-11 ENCOUNTER — Encounter (HOSPITAL_COMMUNITY)
Admission: RE | Admit: 2021-06-11 | Discharge: 2021-06-11 | Disposition: A | Discharge status: Home / Self Care | Payer: 59 | Source: Ambulatory Visit | Attending: Cardiovascular Disease | Admitting: Cardiovascular Disease

## 2021-06-11 DIAGNOSIS — I5022 Chronic systolic (congestive) heart failure: Secondary | ICD-10-CM

## 2021-06-11 NOTE — Progress Notes (Signed)
Reviewed home exercise Rx with patient today. Pt encouraged to warm-up. Cool-down, and stretch. Reviewed THRR of 65-130 and RPE of 11-13. Hydration encouraged. Discussed weather parameters for temperature and humidity for safe exercise outdoors. Reviewed S/S that would require pt to terminate exercise and when to call 911 vs MD.  Pt verbalized understanding of the home exercise Rx and was provided a copy.  Lesly Rubenstein MS, ACSM-CEP, CCRP

## 2021-06-12 ENCOUNTER — Ambulatory Visit (INDEPENDENT_AMBULATORY_CARE_PROVIDER_SITE_OTHER): Payer: 59 | Admitting: Cardiovascular Disease

## 2021-06-12 ENCOUNTER — Encounter: Payer: Self-pay | Admitting: Cardiovascular Disease

## 2021-06-12 VITALS — BP 124/78 | HR 71 | Ht 74.0 in | Wt 197.0 lb

## 2021-06-12 DIAGNOSIS — I739 Peripheral vascular disease, unspecified: Secondary | ICD-10-CM | POA: Diagnosis not present

## 2021-06-12 DIAGNOSIS — I5022 Chronic systolic (congestive) heart failure: Secondary | ICD-10-CM

## 2021-06-12 DIAGNOSIS — Z720 Tobacco use: Secondary | ICD-10-CM | POA: Diagnosis not present

## 2021-06-12 DIAGNOSIS — E785 Hyperlipidemia, unspecified: Secondary | ICD-10-CM

## 2021-06-12 DIAGNOSIS — I4819 Other persistent atrial fibrillation: Secondary | ICD-10-CM

## 2021-06-12 MED ORDER — SPIRONOLACTONE 25 MG PO TABS: 12.5000 mg | ORAL_TABLET | Freq: Every day | ORAL | 3 refills | Status: DC

## 2021-06-12 NOTE — Progress Notes (Signed)
Cardiology Office Note   Date:  06/15/2021   ID:  Christopher Gutierrez, DOB [birth year], MRN [id number]  PCP:  [name], DO  Cardiologist: Dr. [name].  No chief complaint on file.     History of Present Illness: Christopher Gutierrez is a 60 y.o. male who is here today for a follow-up visit regarding peripheral arterial disease.   He has known history of atrial fibrillation/flutter, essential hypertension, tobacco use, and chronic systolic heart failure with severely reduced LV systolic function. The patient was diagnosed with heart failure in March in the setting of atrial fibrillation with rapid ventricular response.  Echocardiogram showed an EF of 20 to 25%.  He underwent successful TEE guided cardioversion.  Heart failure improved gradually with medical therapy with most recent ejection fraction 40 to 45%.  He was seen recently for severe left calf claudication with rest pain and ischemic changes in the toes. Angiography was done in August of this year which showed no significant aortoiliac disease, on the left, the distal SFA was occluded into the proximal popliteal artery with two-vessel runoff below the knee.  On the right side, there was severe focal stenosis in the distal SFA.  I performed successful angioplasty and 2 overlapped [employer] self-expanding stents to the left SFA into the proximal popliteal artery. Postprocedure ABI improved to normal on the left side and was mildly reduced on the right side of 0.81.  Duplex showed normal velocities in the left SFA/popliteal artery stents.  He cut down on tobacco use to half a pack per day.  There is history of excessive alcohol use but not recently.  He is attending cardiac rehab with gradual improvement in symptoms although he continues to be deconditioned.  He also reports mental fogginess.  No chest pain or shortness of breath.  His recovery has been very slow overall.  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   Hypertension     Past Surgical History:  Procedure Laterality Date   ABDOMINAL AORTOGRAM W/LOWER EXTREMITY N/A 03/07/2021   Procedure: ABDOMINAL AORTOGRAM W/LOWER EXTREMITY;  Surgeon: Wellington Hampshire, MD;  Location: [place] [facility];  Service: Cardiovascular;  Laterality: N/A;   CARDIOVERSION N/A 11/01/2020   Procedure: CARDIOVERSION;  Surgeon: Jerline Pain, MD;  Location[facility] ENDOSCOPY;  Service: Cardiovascular;  Laterality: N/A;   CATARACT EXTRACTION Right    MOUTH SURGERY  2013   PERIPHERAL VASCULAR INTERVENTION Left 03/07/2021   Procedure: PERIPHERAL VASCULAR INTERVENTION;  Surgeon: Wellington Hampshire, MD;  Location: [place] [facility];  Service: Cardiovascular;  Laterality: Left;  left SFA   TEE WITHOUT CARDIOVERSION N/A 11/01/2020   Procedure: TRANSESOPHAGEAL ECHOCARDIOGRAM (TEE);  Surgeon: Jerline Pain, MD;  Location: [facility] ENDOSCOPY;  Service: Cardiovascular;  Laterality: N/A;     Current Outpatient Medications  Medication Sig Dispense Refill   acetaminophen (TYLENOL) 325 MG tablet Take 650 mg by mouth every 6 (six) hours as needed for moderate pain or headache.     bisoprolol (ZEBETA) 5 MG tablet Take 0.5 tablets (2.5 mg total) by mouth daily. 45 tablet 3   cetirizine (ZYRTEC) 10 MG tablet Take 10 mg by mouth as needed for allergies.     clopidogrel (PLAVIX) 75 MG tablet Take 1 tablet (75 mg total) by mouth daily. 30 tablet 6   dapagliflozin propanediol (FARXIGA)  10 MG TABS tablet Take 1 tablet (10 mg total) by mouth daily before breakfast. 30 tablet 4   ELIQUIS 5 MG TABS tablet TAKE 1 TABLET BY MOUTH TWICE A DAY 60 tablet 3   Famotidine-Ca Carb-Mag Hydrox (PEPCID COMPLETE PO) Take 1 tablet by mouth daily as needed (acid reflux).     Nicotine 10 MG/ML SOLN 1-2 puffs as needed 10 mL 2   nicotine polacrilex (COMMIT) 4 MG lozenge Take 4 mg  by mouth as needed for smoking cessation.     rosuvastatin (CRESTOR) 20 MG tablet Take 1 tablet (20 mg total) by mouth daily. 30 tablet 6   sacubitril-valsartan (ENTRESTO) 97-103 MG Take 1 tablet by mouth 2 (two) times daily. 60 tablet 6   spironolactone (ALDACTONE) 25 MG tablet Take 0.5 tablets (12.5 mg total) by mouth daily. 45 tablet 3   No current facility-administered medications for this visit.    Allergies:   Beta adrenergic blockers and Tape    Social History:  The patient  reports that he has been smoking cigarettes. He has a 37.50 pack-year smoking history. His smokeless tobacco use includes chew. He reports current alcohol use of about 24.0 standard drinks per week. He reports current drug use. Frequency: 2.00 times per week. Drug: Marijuana.   Family History:  The patient's family history includes Birth defects in his paternal aunt; COPD in his maternal uncle; Gout in his brother; Hypertension in his brother; Stroke in his paternal aunt.    ROS:  Please see the history of present illness.   Otherwise, review of systems are positive for .   All other systems are reviewed and negative.    PHYSICAL EXAM: VS:  BP 124/78   Pulse 71   Ht 6\' 2"  (1.88 m)   Wt 197 lb (89.4 kg)   SpO2 98%   BMI 25.29 kg/m  , BMI Body mass index is 25.29 kg/m. GEN: Well nourished, well developed, in no acute distress  HEENT: normal  Neck: no JVD, carotid bruits, or masses Cardiac: RRR; no murmurs, rubs, or gallops,no edema  Respiratory:  clear to auscultation bilaterally, normal work of breathing GI: soft, nontender, nondistended, + BS MS: no deformity or atrophy  Skin: warm and dry, no rash Neuro:  Strength and sensation are intact Psych: euthymic mood, full affect    EKG:  EKG is not ordered today.    Recent Labs: 10/12/2020: TSH 2.06 10/30/2020: ALT 30 11/01/2020: Magnesium 2.2 01/26/2021: B Natriuretic Peptide 385.9 04/30/2021: BUN 12; Creatinine, Ser 1.03; Potassium 3.7; Sodium  138 05/28/2021: Hemoglobin 16.9; Platelet Count 159    Lipid Panel    Component Value Date/Time   CHOL 229 (H) 01/26/2021 1213   TRIG 105 01/26/2021 1213   HDL 69 01/26/2021 1213   CHOLHDL 3.3 01/26/2021 1213   VLDL 21 01/26/2021 1213   LDLCALC 139 (H) 01/26/2021 1213      Wt Readings from Last 3 Encounters:  06/12/21 197 lb (89.4 kg)  05/28/21 198 lb (89.8 kg)  05/17/21 196 lb 13.9 oz (89.3 kg)        No flowsheet data found.    ASSESSMENT AND PLAN:  1.  Peripheral arterial disease:   Status post recent successful endovascular intervention on the left SFA and popliteal artery with complete resolution of symptoms and normalization of ABI.   He does have residual right SFA disease but he denies right leg claudication at the present time.   Repeat lower extremity arterial Doppler in February.  2.  Chronic systolic heart failure: He appears to be euvolemic without furosemide.  He is currently on bisoprolol, Farxiga, spironolactone and Entresto.  3.  Persistent atrial fibrillation: He is maintaining in sinus rhythm and tolerating anticoagulation.  4.  Tobacco use: He cut down but has not been able to quit smoking completely.  I again discussed with him the importance of smoking cessation.    5.  Hyperlipidemia: Continue treatment with rosuvastatin with target LDL of less than 70.  6.  Short-term disability: The patient is gradually improving but is not back to baseline.  I think he continue to benefit from continued rehab until the end of the year and he will likely be able to go back to work by January.    Disposition: Follow-up in 3 months.  Signed,  Kathlyn [place], MD  06/15/2021 2:39 PM    [place]

## 2021-06-12 NOTE — Patient Instructions (Signed)
Medication Instructions:  No changes *If you need a refill on your cardiac medications before your next appointment, please call your pharmacy*   Lab Work: None ordered If you have labs (blood work) drawn today and your tests are completely normal, you will receive your results only by: MyChart Message (if you have MyChart) OR A paper copy in the mail If you have any lab test that is abnormal or we need to change your treatment, we will call you to review the results.   Testing/Procedures: None ordered   Follow-Up: At [facility], you and your health needs are our priority.  As part of our continuing mission to provide you with exceptional heart care, we have created designated Provider Care Teams.  These Care Teams include your primary Cardiologist (physician) and Advanced Practice Providers (APPs -  Physician Assistants and Nurse Practitioners) who all work together to provide you with the care you need, when you need it.  We recommend signing up for the patient portal called "MyChart".  Sign up information is provided on this After Visit Summary.  MyChart is used to connect with patients for Virtual Visits (Telemedicine).  Patients are able to view lab/test results, encounter notes, upcoming appointments, etc.  Non-urgent messages can be sent to your provider as well.   To learn more about what you can do with MyChart, go to [email or web address].    Your next appointment:   3 month(s)  The format for your next appointment:   In Person  Provider:   Muhammad Arida, MD   

## 2021-06-13 ENCOUNTER — Telehealth (HOSPITAL_COMMUNITY): Payer: Self-pay | Admitting: Family Medicine

## 2021-06-13 ENCOUNTER — Encounter (HOSPITAL_COMMUNITY): Payer: 59

## 2021-06-15 ENCOUNTER — Encounter (HOSPITAL_COMMUNITY)
Admission: RE | Admit: 2021-06-15 | Discharge: 2021-06-15 | Disposition: A | Discharge status: Home / Self Care | Payer: 59 | Source: Ambulatory Visit | Attending: Cardiovascular Disease | Admitting: Cardiovascular Disease

## 2021-06-15 ENCOUNTER — Other Ambulatory Visit: Payer: Self-pay

## 2021-06-15 DIAGNOSIS — I5022 Chronic systolic (congestive) heart failure: Secondary | ICD-10-CM

## 2021-06-18 ENCOUNTER — Encounter (HOSPITAL_COMMUNITY)
Admission: RE | Admit: 2021-06-18 | Discharge: 2021-06-18 | Disposition: A | Discharge status: Home / Self Care | Payer: 59 | Source: Ambulatory Visit | Attending: Cardiovascular Disease | Admitting: Cardiovascular Disease

## 2021-06-18 ENCOUNTER — Other Ambulatory Visit: Payer: Self-pay

## 2021-06-18 ENCOUNTER — Encounter (HOSPITAL_COMMUNITY): Payer: 59

## 2021-06-18 DIAGNOSIS — I5022 Chronic systolic (congestive) heart failure: Secondary | ICD-10-CM | POA: Diagnosis not present

## 2021-06-19 ENCOUNTER — Telehealth: Payer: Self-pay | Admitting: Cardiovascular Disease

## 2021-06-19 NOTE — Telephone Encounter (Signed)
Disability forms sent in through my chart for [place]. Forms were given to [employer]. On 06/15/21 to be completed by Dr.Arida when he is back in the office.

## 2021-06-20 ENCOUNTER — Encounter (HOSPITAL_COMMUNITY)
Admission: RE | Admit: 2021-06-20 | Discharge: 2021-06-20 | Disposition: A | Discharge status: Home / Self Care | Payer: 59 | Source: Ambulatory Visit | Attending: Cardiovascular Disease | Admitting: Cardiovascular Disease

## 2021-06-20 ENCOUNTER — Other Ambulatory Visit: Payer: Self-pay

## 2021-06-20 ENCOUNTER — Encounter (HOSPITAL_COMMUNITY): Payer: 59

## 2021-06-20 DIAGNOSIS — I5022 Chronic systolic (congestive) heart failure: Secondary | ICD-10-CM | POA: Diagnosis not present

## 2021-06-22 ENCOUNTER — Encounter (HOSPITAL_COMMUNITY)
Admission: RE | Admit: 2021-06-22 | Discharge: 2021-06-22 | Disposition: A | Discharge status: Home / Self Care | Payer: 59 | Source: Ambulatory Visit | Attending: Cardiovascular Disease | Admitting: Cardiovascular Disease

## 2021-06-22 ENCOUNTER — Other Ambulatory Visit: Payer: Self-pay

## 2021-06-22 ENCOUNTER — Encounter (HOSPITAL_COMMUNITY): Payer: 59

## 2021-06-22 DIAGNOSIS — I5022 Chronic systolic (congestive) heart failure: Secondary | ICD-10-CM

## 2021-06-22 NOTE — Telephone Encounter (Signed)
06/22/21 unable to contact patient, forms faxed to [employer] and copy of forms left at front desk for patient to pick up.

## 2021-06-25 ENCOUNTER — Encounter (HOSPITAL_COMMUNITY): Payer: 59

## 2021-06-25 ENCOUNTER — Other Ambulatory Visit: Payer: Self-pay

## 2021-06-25 ENCOUNTER — Encounter (HOSPITAL_COMMUNITY)
Admission: RE | Admit: 2021-06-25 | Discharge: 2021-06-25 | Disposition: A | Discharge status: Home / Self Care | Payer: 59 | Source: Ambulatory Visit | Attending: Cardiovascular Disease | Admitting: Cardiovascular Disease

## 2021-06-25 ENCOUNTER — Inpatient Hospital Stay: Payer: 59

## 2021-06-25 DIAGNOSIS — I5022 Chronic systolic (congestive) heart failure: Secondary | ICD-10-CM | POA: Diagnosis not present

## 2021-06-26 ENCOUNTER — Telehealth: Payer: Self-pay

## 2021-06-26 NOTE — Telephone Encounter (Signed)
Letter has been sent to patient instructing them to call us if they are still interested in completing their sleep study. If we have not received a response from the patient within 30 days of this notice, the order will be cancelled and they will need to discuss the need for a sleep study at their next office visit.  ° °

## 2021-06-27 ENCOUNTER — Other Ambulatory Visit: Payer: Self-pay

## 2021-06-27 ENCOUNTER — Encounter (HOSPITAL_COMMUNITY): Payer: 59

## 2021-06-27 ENCOUNTER — Encounter (HOSPITAL_COMMUNITY)
Admission: RE | Admit: 2021-06-27 | Discharge: 2021-06-27 | Disposition: A | Discharge status: Home / Self Care | Payer: 59 | Source: Ambulatory Visit | Attending: Cardiovascular Disease | Admitting: Cardiovascular Disease

## 2021-06-27 DIAGNOSIS — I5022 Chronic systolic (congestive) heart failure: Secondary | ICD-10-CM | POA: Diagnosis not present

## 2021-06-29 ENCOUNTER — Encounter (HOSPITAL_COMMUNITY): Payer: 59

## 2021-07-02 ENCOUNTER — Encounter (HOSPITAL_COMMUNITY)
Admission: RE | Admit: 2021-07-02 | Discharge: 2021-07-02 | Disposition: A | Discharge status: Home / Self Care | Payer: 59 | Source: Ambulatory Visit | Attending: Cardiovascular Disease | Admitting: Cardiovascular Disease

## 2021-07-02 ENCOUNTER — Encounter (HOSPITAL_COMMUNITY): Payer: 59

## 2021-07-02 ENCOUNTER — Other Ambulatory Visit: Payer: Self-pay

## 2021-07-02 DIAGNOSIS — I5022 Chronic systolic (congestive) heart failure: Secondary | ICD-10-CM

## 2021-07-03 ENCOUNTER — Inpatient Hospital Stay: Payer: 59 | Attending: Oncology

## 2021-07-03 DIAGNOSIS — D751 Secondary polycythemia: Secondary | ICD-10-CM | POA: Insufficient documentation

## 2021-07-03 DIAGNOSIS — F17210 Nicotine dependence, cigarettes, uncomplicated: Secondary | ICD-10-CM | POA: Insufficient documentation

## 2021-07-03 LAB — VITAMIN B12: Vitamin B-12: 1227 pg/mL — ABNORMAL HIGH (ref 180–914)

## 2021-07-03 NOTE — Progress Notes (Signed)
Cardiac Individual Treatment Plan  Patient Details  Name: Christopher Gutierrez MRN: [id number] Date of Birth: [birth year] Referring Provider:   Flowsheet Row CARDIAC REHAB PHASE II ORIENTATION from 05/17/2021 in [facility]  Referring Provider Dr. Kathlyn Sacramento MD       Initial Encounter Date:  [facility] from 05/17/2021 in [facility]  Date 05/17/21       Visit Diagnosis: Heart failure, chronic systolic ([place])  Patient's Home Medications on Admission:  Current Outpatient Medications:    acetaminophen (TYLENOL) 325 MG tablet, Take 650 mg by mouth every 6 (six) hours as needed for moderate pain or headache., Disp: , Rfl:    bisoprolol (ZEBETA) 5 MG tablet, Take 0.5 tablets (2.5 mg total) by mouth daily., Disp: 45 tablet, Rfl: 3   cetirizine (ZYRTEC) 10 MG tablet, Take 10 mg by mouth as needed for allergies., Disp: , Rfl:    clopidogrel (PLAVIX) 75 MG tablet, Take 1 tablet (75 mg total) by mouth daily., Disp: 30 tablet, Rfl: 6   dapagliflozin propanediol (FARXIGA) 10 MG TABS tablet, Take 1 tablet (10 mg total) by mouth daily before breakfast., Disp: 30 tablet, Rfl: 4   ELIQUIS 5 MG TABS tablet, TAKE 1 TABLET BY MOUTH TWICE A DAY, Disp: 60 tablet, Rfl: 3   Famotidine-Ca Carb-Mag Hydrox (PEPCID COMPLETE PO), Take 1 tablet by mouth daily as needed (acid reflux)., Disp: , Rfl:    Nicotine 10 MG/ML SOLN, 1-2 puffs as needed, Disp: 10 mL, Rfl: 2   nicotine polacrilex (COMMIT) 4 MG lozenge, Take 4 mg by mouth as needed for smoking cessation., Disp: , Rfl:    rosuvastatin (CRESTOR) 20 MG tablet, Take 1 tablet (20 mg total) by mouth daily., Disp: 30 tablet, Rfl: 6   sacubitril-valsartan (ENTRESTO) 97-103 MG, Take 1 tablet by mouth 2 (two) times daily., Disp: 60 tablet, Rfl: 6   spironolactone (ALDACTONE) 25 MG tablet, Take 0.5 tablets (12.5 mg total) by mouth daily., Disp: 45 tablet, Rfl:  3  Past Medical History: Past Medical History:  Diagnosis Date   Atrial fibrillation ([place]) 10/24/2020   Atrial fibrillation and flutter ([place])    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  Not ready to quit.    Labs: Recent Review Flowsheet Data     Labs for ITP Cardiac and Pulmonary Rehab Latest Ref Rng & Units 10/12/2020 10/31/2020 01/26/2021   Cholestrol 0 - 200 mg/dL - 153 229(H)   LDLCALC 0 - 99 mg/dL - 103(H) 139(H)   HDL >40 mg/dL - 36(L) 69   Trlycerides <150 mg/dL - 68 105   Hemoglobin A1c <5.7 % of total Hgb 5.3 - -       Capillary Blood Glucose: No results found for: GLUCAP   Exercise Target Goals: Exercise Program Goal: Individual exercise prescription set using results from initial 6 min walk test and THRR while considering  patient's activity barriers and safety.   Exercise Prescription Goal: Initial exercise prescription builds to 30-45 minutes a day of aerobic activity, 2-3 days per week.  Home  exercise guidelines will be given to patient during program as part of exercise prescription that the participant will acknowledge.  Activity Barriers & Risk Stratification:  Activity Barriers & Cardiac Risk Stratification - 05/17/21 1200       Activity Barriers & Cardiac Risk Stratification   Activity Barriers Chest Pain/Angina;Back Problems;Balance Concerns;Deconditioning;History of Falls    Cardiac Risk Stratification High             6 Minute Walk:  6 Minute Walk     Row Name 05/17/21 1155         6 Minute Walk   Phase Initial     Distance 1169 feet     Walk Time 6 minutes     # of Rest Breaks 0     MPH 2.21     METS 3.64     RPE 11      Perceived Dyspnea  0     VO2 Peak 12.75     Symptoms Yes (comment)     Comments right leg fatigue/tightness and  2/10 chest tightness     Resting HR 59 bpm     Resting BP 150/87     Resting Oxygen Saturation  98 %     Exercise Oxygen Saturation  during 6 min walk 100 %     Max Ex. HR 1.84 bpm     Max Ex. BP 160/87     2 Minute Post BP 146/86              Oxygen Initial Assessment:   Oxygen Re-Evaluation:   Oxygen Discharge (Final Oxygen Re-Evaluation):   Initial Exercise Prescription:  Initial Exercise Prescription - 05/17/21 1200       Date of Initial Exercise RX and Referring Provider   Date 05/17/21    Referring Provider Dr. Kathlyn Sacramento MD    Expected Discharge Date 07/13/21      NuStep   Level 2    SPM 75    Minutes 30    METs 2      Prescription Details   Frequency (times per week) 3    Duration Progress to 30 minutes of continuous aerobic without signs/symptoms of physical distress      Intensity   THRR 40-80% of Max Heartrate 65-130    Ratings of Perceived Exertion 11-13    Perceived Dyspnea 0-4      Progression   Progression Continue progressive overload as per policy without signs/symptoms or physical distress.      Resistance Training   Training Prescription Yes    Weight 3    Reps 10-15             Perform Capillary Blood Glucose checks as needed.  Exercise Prescription Changes:   Exercise Prescription Changes     Row Name 05/21/21 1500 06/08/21 1500 06/19/21 1000 06/25/21 1500       Response to Exercise   Blood Pressure (Admit) 140/76 136/78 120/80 142/92    Blood Pressure (Exercise) 142/88 150/84 120/80 122/78    Blood Pressure (Exit) 140/72 142/78 122/80 130/80    Heart Rate (Admit) 76 bpm 65 bpm 67 bpm 68 bpm    Heart Rate (Exercise) 84 bpm 88 bpm 79 bpm 85 bpm    Heart Rate (Exit) 62 bpm 64 bpm 63 bpm 68 bpm    Rating of Perceived Exertion (Exercise) 10 -- 11 12    Symptoms None None None None    Comments Pt's first  day in the [facility]  program Reviewed METs/Home exercise Rx Reviewed METs and Goals Reviewed METs    Duration Continue with 30 min of aerobic exercise without signs/symptoms of physical distress. Progress to 30 minutes of  aerobic without signs/symptoms of physical distress Continue with 30 min of aerobic exercise without signs/symptoms of physical distress. Continue with 30 min of aerobic exercise without signs/symptoms of physical distress.    Intensity THRR unchanged THRR unchanged THRR unchanged THRR unchanged      Progression   Progression Continue to progress workloads to maintain intensity without signs/symptoms of physical distress. Continue to progress workloads to maintain intensity without signs/symptoms of physical distress. Continue to progress workloads to maintain intensity without signs/symptoms of physical distress. Continue to progress workloads to maintain intensity without signs/symptoms of physical distress.    Average METs 1.8 2 2.1 2.2      Resistance Training   Training Prescription Yes Yes Yes Yes    Weight '3 3 3 3    ' Reps 10-15 10-15 10-15 10-15    Time 10 Minutes 10 Minutes 10 Minutes 10 Minutes      Interval Training   Interval Training No No No No      NuStep   Level '2 2 3 4    ' SPM 75 80 80 80    Minutes '30 30 30 30    ' METs 1.8 2 2.1 2.2      Home Exercise Plan   Plans to continue exercise at -- Home (comment) Home (comment) Home (comment)    Frequency -- Add 2 additional days to program exercise sessions. Add 2 additional days to program exercise sessions. Add 2 additional days to program exercise sessions.    Initial Home Exercises Provided -- 06/08/21 06/08/21 06/08/21             Exercise Comments:   Exercise Comments     Row Name 05/21/21 1500 06/08/21 1638 06/15/21 1500 06/26/21 1159     Exercise Comments Pt completed the session today without compliants. Off to a good start. Reviewed home exercise Rx with patient. Pt verbalized understanding of  the Rx and was provided a copy. Reviwed METs and goals. Slow progress to goal. Pt voices that his legs feel stronger and he has less pain in his lower legs. Reviewed METs. Pt is making slow progress.             Exercise Goals and Review:   Exercise Goals     Row Name 05/17/21 1203             Exercise Goals   Increase Physical Activity Yes       Intervention Provide advice, education, support and counseling about physical activity/exercise needs.;Develop an individualized exercise prescription for aerobic and resistive training based on initial evaluation findings, risk stratification, comorbidities and participant's personal goals.       Expected Outcomes Short Term: Attend rehab on a regular basis to increase amount of physical activity.;Long Term: Add in home exercise to make exercise part of routine and to increase amount of physical activity.;Long Term: Exercising regularly at least 3-5 days a week.       Increase Strength and Stamina Yes       Intervention Provide advice, education, support and counseling about physical activity/exercise needs.;Develop an individualized exercise prescription for aerobic and resistive training based on initial evaluation findings, risk stratification, comorbidities and participant's personal goals.       Expected Outcomes Short Term: Increase workloads from initial exercise prescription for resistance, speed,  and METs.;Short Term: Perform resistance training exercises routinely during rehab and add in resistance training at home;Long Term: Improve cardiorespiratory fitness, muscular endurance and strength as measured by increased METs and functional capacity (6MWT)       Able to understand and use rate of perceived exertion (RPE) scale Yes       Intervention Provide education and explanation on how to use RPE scale       Expected Outcomes Short Term: Able to use RPE daily in rehab to express subjective intensity level;Long Term:  Able to use RPE to  guide intensity level when exercising independently       Knowledge and understanding of Target Heart Rate Range (THRR) Yes       Intervention Provide education and explanation of THRR including how the numbers were predicted and where they are located for reference       Expected Outcomes Short Term: Able to state/look up THRR;Long Term: Able to use THRR to govern intensity when exercising independently;Short Term: Able to use daily as guideline for intensity in rehab       Understanding of Exercise Prescription Yes       Intervention Provide education, explanation, and written materials on patient's individual exercise prescription       Expected Outcomes Short Term: Able to explain program exercise prescription;Long Term: Able to explain home exercise prescription to exercise independently                Exercise Goals Re-Evaluation :  Exercise Goals Re-Evaluation     Row Name 05/21/21 1500 06/08/21 1637 06/15/21 1500         Exercise Goal Re-Evaluation   Exercise Goals Review Increase Physical Activity;Increase Strength and Stamina;Able to understand and use rate of perceived exertion (RPE) scale;Knowledge and understanding of Target Heart Rate Range (THRR);Understanding of Exercise Prescription Increase Physical Activity;Increase Strength and Stamina;Able to understand and use rate of perceived exertion (RPE) scale;Knowledge and understanding of Target Heart Rate Range (THRR);Understanding of Exercise Prescription Increase Physical Activity;Increase Strength and Stamina;Able to understand and use rate of perceived exertion (RPE) scale;Knowledge and understanding of Target Heart Rate Range (THRR);Able to check pulse independently;Understanding of Exercise Prescription     Comments Pt's first day in the [facility]2 program. Pt understands the exercise Rx, THRR, and RPE scale. Reviewed home exercise Rx with patient today. Pt not exercsing at home. Encouraged patient to bgin walking 2x/week working  up to 30 minutes on of days from the [facility] program. Reviewed METs and goals. Pt making slow progress on improving MET level. Pt does feel somewhat stronger. Continue to encourage walking on off days from the [facility] program.     Expected Outcomes Will continue to monitor patient and progress exercise workloads as tolerated. Pt will walk at home 2x/week. Will contiue to montior patient and progress exercise workloads as tolerated,              Discharge Exercise Prescription (Final Exercise Prescription Changes):  Exercise Prescription Changes - 06/25/21 1500       Response to Exercise   Blood Pressure (Admit) 142/92    Blood Pressure (Exercise) 122/78    Blood Pressure (Exit) 130/80    Heart Rate (Admit) 68 bpm    Heart Rate (Exercise) 85 bpm    Heart Rate (Exit) 68 bpm    Rating of Perceived Exertion (Exercise) 12    Symptoms None    Comments Reviewed METs    Duration Continue with 30 min of aerobic exercise  without signs/symptoms of physical distress.    Intensity THRR unchanged      Progression   Progression Continue to progress workloads to maintain intensity without signs/symptoms of physical distress.    Average METs 2.2      Resistance Training   Training Prescription Yes    Weight 3    Reps 10-15    Time 10 Minutes      Interval Training   Interval Training No      NuStep   Level 4    SPM 80    Minutes 30    METs 2.2      Home Exercise Plan   Plans to continue exercise at Home (comment)    Frequency Add 2 additional days to program exercise sessions.    Initial Home Exercises Provided 06/08/21             Nutrition:  Target Goals: Understanding of nutrition guidelines, daily intake of sodium <1540m, cholesterol <2046m calories 30% from fat and 7% or less from saturated fats, daily to have 5 or more servings of fruits and vegetables.  Biometrics:  Pre Biometrics - 05/17/21 1154       Pre Biometrics   Waist Circumference 41.25 inches    Hip  Circumference 42 inches    Waist to Hip Ratio 0.98 %    Triceps Skinfold 14 mm    % Body Fat 26.3 %    Grip Strength 35 kg    Flexibility 11.5 in    Single Leg Stand 13.5 seconds              Nutrition Therapy Plan and Nutrition Goals:   Nutrition Assessments:  MEDIFICTS Score Key: ?70 Need to make dietary changes  40-70 Heart Healthy Diet ? 40 Therapeutic Level Cholesterol Diet    Picture Your Plate Scores: <4<93nhealthy dietary pattern with much room for improvement. 41-50 Dietary pattern unlikely to meet recommendations for good health and room for improvement. 51-60 More healthful dietary pattern, with some room for improvement.  >60 Healthy dietary pattern, although there may be some specific behaviors that could be improved.    Nutrition Goals Re-Evaluation:   Nutrition Goals Re-Evaluation:   Nutrition Goals Discharge (Final Nutrition Goals Re-Evaluation):   Psychosocial: Target Goals: Acknowledge presence or absence of significant depression and/or stress, maximize coping skills, provide positive support system. Participant is able to verbalize types and ability to use techniques and skills needed for reducing stress and depression.  Initial Review & Psychosocial Screening:  Initial Psych Review & Screening - 05/17/21 1326       Family Dynamics   Comments lives with father in a "roommate" type of relationship.  They sometimes get on each others nerves.      Screening Interventions   Interventions Encouraged to exercise;To provide support and resources with identified psychosocial needs;Provide feedback about the scores to participant    Expected Outcomes Long Term Goal: Stressors or current issues are controlled or eliminated.;Short Term goal: Identification and review with participant of any Quality of Life or Depression concerns found by scoring the questionnaire.;Long Term goal: The participant improves quality of Life and PHQ9 Scores as seen by post  scores and/or verbalization of changes;Short Term goal: Utilizing psychosocial counselor, staff and physician to assist with identification of specific Stressors or current issues interfering with healing process. Setting desired goal for each stressor or current issue identified.             Quality of Life Scores:  Quality of Life - 05/17/21 1302       Quality of Life   Select Quality of Life      Quality of Life Scores   Health/Function Pre 7.9 %    Socioeconomic Pre 11.13 %    Psych/Spiritual Pre 10.5 %    Family Pre 10.8 %    GLOBAL Pre 9.75 %            Scores of 19 and below usually indicate a poorer quality of life in these areas.  A difference of  2-3 points is a clinically meaningful difference.  A difference of 2-3 points in the total score of the Quality of Life Index has been associated with significant improvement in overall quality of life, self-image, physical symptoms, and general health in studies assessing change in quality of life.  PHQ-9: Recent Review Flowsheet Data     Depression screen [facility] 2/9 05/17/2021 10/12/2020 09/29/2017   Decreased Interest 1 1 0   Down, Depressed, Hopeless 2 1 0   PHQ - 2 Score 3 2 0   Altered sleeping 2 0 -   Tired, decreased energy 1 2 -   Change in appetite 0 0 -   Feeling bad or failure about yourself  1 2 -   Trouble concentrating 1 2 -   Moving slowly or fidgety/restless 1 2 -   Suicidal thoughts 0 0 -   PHQ-9 Score 9 10 -   Difficult doing work/chores Somewhat difficult Somewhat difficult -      Interpretation of Total Score  Total Score Depression Severity:  1-4 = Minimal depression, 5-9 = Mild depression, 10-14 = Moderate depression, 15-19 = Moderately severe depression, 20-27 = Severe depression   Psychosocial Evaluation and Intervention:   Psychosocial Re-Evaluation:  Psychosocial Re-Evaluation     [place] Name 06/05/21 1620 07/03/21 1525           Psychosocial Re-Evaluation   Current issues with  Current Stress Concerns;Current Sleep Concerns Current Stress Concerns;Current Sleep Concerns      Comments [place] has not voiced any increased concerns and stressors although the patient still has ongoing financial concerns. Quality of life questionnaire was reviewed. [place] did not want his QOL forwarded to his primary care at this time. [place] has not voiced any increased concerns and stressors during exercise at cardiac rehab      Expected Outcomes [place] will have decreased stressors/ concerns upon completion of phase 2 cardiac rehab [place] will have decreased stressors/ concerns upon completion of phase 2 cardiac rehab      Interventions Encouraged to attend Cardiac Rehabilitation for the exercise;Stress management education;Relaxation education Encouraged to attend Cardiac Rehabilitation for the exercise;Stress management education;Relaxation education      Continue Psychosocial Services  Follow up required by staff No Follow up required        Initial Review   Source of Stress Concerns Occupation;Unable to participate in former interests or hobbies;Financial;Unable to perform yard/household activities Occupation;Unable to participate in former interests or hobbies;Financial;Unable to perform yard/household activities      Comments Will continue to monitor and offer support as needed. Will continue to monitor and offer support as needed.               Psychosocial Discharge (Final Psychosocial Re-Evaluation):  Psychosocial Re-Evaluation - 07/03/21 1525       Psychosocial Re-Evaluation   Current issues with Current Stress Concerns;Current Sleep Concerns    Comments [place] has not voiced any increased concerns and  stressors during exercise at cardiac rehab    Expected Outcomes [place] will have decreased stressors/ concerns upon completion of phase 2 cardiac rehab    Interventions Encouraged to attend Cardiac Rehabilitation for the exercise;Stress management education;Relaxation  education    Continue Psychosocial Services  No Follow up required      Initial Review   Source of Stress Concerns Occupation;Unable to participate in former interests or hobbies;Financial;Unable to perform yard/household activities    Comments Will continue to monitor and offer support as needed.             Vocational Rehabilitation: Provide vocational rehab assistance to qualifying candidates.   Vocational Rehab Evaluation & Intervention:  Vocational Rehab - 05/17/21 1235       Initial Vocational Rehab Evaluation & Intervention   Assessment shows need for Vocational Rehabilitation Yes    Vocational Rehab Packet given to patient 05/17/21      Vocational Rehab Re-[place]va[facility]   Comments Ronalee Belts was given the packet of information to review to believe this is a resource for finding other employment he could do with his present health issues.             Education: Education Goals: Education classes will be provided on a weekly basis, covering required topics. Participant will state understanding/return demonstration of topics presented.  Learning Barriers/Preferences:  Learning Barriers/Preferences - 05/17/21 1302       Learning Barriers/Preferences   Learning Barriers None    Learning Preferences Audio;Computer/Internet;Group Instruction;Individual Instruction;Pictoral;Skilled Demonstration;Verbal Instruction;Video;Written Material             Education Topics: Count Your Pulse:  -Group instruction provided by verbal instruction, demonstration, patient participation and written materials to support subject.  Instructors address importance of being able to find your pulse and how to count your pulse when at home without a heart monitor.  Patients get hands on experience counting their pulse with staff help and individually.   Heart Attack, Angina, and Risk Factor Modification:  -Group instruction provided by verbal instruction, video, and written materials to  support subject.  Instructors address signs and symptoms of angina and heart attacks.    Also discuss risk factors for heart disease and how to make changes to improve heart health risk factors.   Functional Fitness:  -Group instruction provided by verbal instruction, demonstration, patient participation, and written materials to support subject.  Instructors address safety measures for doing things around the house.  Discuss how to get up and down off the floor, how to pick things up properly, how to safely get out of a chair without assistance, and balance training.   Meditation and Mindfulness:  -Group instruction provided by verbal instruction, patient participation, and written materials to support subject.  Instructor addresses importance of mindfulness and meditation practice to help reduce stress and improve awareness.  Instructor also leads participants through a meditation exercise.    Stretching for Flexibility and Mobility:  -Group instruction provided by verbal instruction, patient participation, and written materials to support subject.  Instructors lead participants through series of stretches that are designed to increase flexibility thus improving mobility.  These stretches are additional exercise for major muscle groups that are typically performed during regular warm up and cool down.   Hands Only CPR:  -Group verbal, video, and participation provides a basic overview of AHA guidelines for community CPR. Role-play of emergencies allow participants the opportunity to practice calling for help and chest compression technique with discussion of AED use.   Hypertension: -Group  verbal and written instruction that provides a basic overview of hypertension including the most recent diagnostic guidelines, risk factor reduction with self-care instructions and medication management.    Nutrition I class: Heart Healthy Eating:  -Group instruction provided by PowerPoint slides, verbal  discussion, and written materials to support subject matter. The instructor gives an explanation and review of the Therapeutic Lifestyle Changes diet recommendations, which includes a discussion on lipid goals, dietary fat, sodium, fiber, plant stanol/sterol esters, sugar, and the components of a well-balanced, healthy diet.   Nutrition II class: Lifestyle Skills:  -Group instruction provided by PowerPoint slides, verbal discussion, and written materials to support subject matter. The instructor gives an explanation and review of label reading, grocery shopping for heart health, heart healthy recipe modifications, and ways to make healthier choices when eating out.   Diabetes Question & Answer:  -Group instruction provided by PowerPoint slides, verbal discussion, and written materials to support subject matter. The instructor gives an explanation and review of diabetes co-morbidities, pre- and post-prandial blood glucose goals, pre-exercise blood glucose goals, signs, symptoms, and treatment of hypoglycemia and hyperglycemia, and foot care basics.   Diabetes Blitz:  -Group instruction provided by PowerPoint slides, verbal discussion, and written materials to support subject matter. The instructor gives an explanation and review of the physiology behind type 1 and type 2 diabetes, diabetes medications and rational behind using different medications, pre- and post-prandial blood glucose recommendations and Hemoglobin A1c goals, diabetes diet, and exercise including blood glucose guidelines for exercising safely.    Portion Distortion:  -Group instruction provided by PowerPoint slides, verbal discussion, written materials, and food models to support subject matter. The instructor gives an explanation of serving size versus portion size, changes in portions sizes over the last 20 years, and what consists of a serving from each food group.   Stress Management:  -Group instruction provided by verbal  instruction, video, and written materials to support subject matter.  Instructors review role of stress in heart disease and how to cope with stress positively.     Exercising on Your Own:  -Group instruction provided by verbal instruction, power point, and written materials to support subject.  Instructors discuss benefits of exercise, components of exercise, frequency and intensity of exercise, and end points for exercise.  Also discuss use of nitroglycerin and activating EMS.  Review options of places to exercise outside of rehab.  Review guidelines for sex with heart disease.   Cardiac Drugs I:  -Group instruction provided by verbal instruction and written materials to support subject.  Instructor reviews cardiac drug classes: antiplatelets, anticoagulants, beta blockers, and statins.  Instructor discusses reasons, side effects, and lifestyle considerations for each drug class.   Cardiac Drugs II:  -Group instruction provided by verbal instruction and written materials to support subject.  Instructor reviews cardiac drug classes: angiotensin converting enzyme inhibitors (ACE-I), angiotensin II receptor blockers (ARBs), nitrates, and calcium channel blockers.  Instructor discusses reasons, side effects, and lifestyle considerations for each drug class.   Anatomy and Physiology of the Circulatory System:  Group verbal and written instruction and models provide basic cardiac anatomy and physiology, with the coronary electrical and arterial systems. Review of: AMI, Angina, Valve disease, Heart Failure, Peripheral Artery Disease, Cardiac Arrhythmia, Pacemakers, and the ICD.   Other Education:  -Group or individual verbal, written, or video instructions that support the educational goals of the cardiac rehab program.   Holiday Eating Survival Tips:  -Group instruction provided by PowerPoint slides, verbal discussion, and written  materials to support subject matter. The instructor gives patients  tips, tricks, and techniques to help them not only survive but enjoy the holidays despite the onslaught of food that accompanies the holidays.   Knowledge Questionnaire Score:  Knowledge Questionnaire Score - 05/17/21 1214       Knowledge Questionnaire Score   Pre Score 22/28             Core Components/Risk Factors/Patient Goals at Admission:  Personal Goals and Risk Factors at Admission - 05/17/21 1305       Core Components/Risk Factors/Patient Goals on Admission    Weight Management Yes    Tobacco Cessation Yes    Number of packs per day 1    Intervention Assist the participant in steps to quit. Provide individualized education and counseling about committing to Tobacco Cessation, relapse prevention, and pharmacological support that can be provided by physician.;Advice worker, assist with locating and accessing local/national Quit Smoking programs, and support quit date choice.    Expected Outcomes Short Term: Will demonstrate readiness to quit, by selecting a quit date.;Short Term: Will quit all tobacco product use, adhering to prevention of relapse plan.;Long Term: Complete abstinence from all tobacco products for at least 12 months from quit date.    Heart Failure Yes    Intervention Provide a combined exercise and nutrition program that is supplemented with education, support and counseling about heart failure. Directed toward relieving symptoms such as shortness of breath, decreased exercise tolerance, and extremity edema.    Expected Outcomes Improve functional capacity of life;Short term: Attendance in program 2-3 days a week with increased exercise capacity. Reported lower sodium intake. Reported increased fruit and vegetable intake. Reports medication compliance.;Short term: Daily weights obtained and reported for increase. Utilizing diuretic protocols set by physician.;Long term: Adoption of self-care skills and reduction of barriers for early signs and  symptoms recognition and intervention leading to self-care maintenance.    Hypertension Yes    Intervention Provide education on lifestyle modifcations including regular physical activity/exercise, weight management, moderate sodium restriction and increased consumption of fresh fruit, vegetables, and low fat dairy, alcohol moderation, and smoking cessation.;Monitor prescription use compliance.    Expected Outcomes Short Term: Continued assessment and intervention until BP is < 140/53m HG in hypertensive participants. < 130/835mHG in hypertensive participants with diabetes, heart failure or chronic kidney disease.;Long Term: Maintenance of blood pressure at goal levels.    Lipids Yes    Intervention Provide education and support for participant on nutrition & aerobic/resistive exercise along with prescribed medications to achieve LDL <7068mHDL >18m69m  Expected Outcomes Short Term: Participant states understanding of desired cholesterol values and is compliant with medications prescribed. Participant is following exercise prescription and nutrition guidelines.;Long Term: Cholesterol controlled with medications as prescribed, with individualized exercise RX and with personalized nutrition plan. Value goals: LDL < 70mg62mL > 40 mg.    Stress Yes    Intervention Offer individual and/or small group education and counseling on adjustment to heart disease, stress management and health-related lifestyle change. Teach and support self-help strategies.;Refer participants experiencing significant psychosocial distress to appropriate mental health specialists for further evaluation and treatment. When possible, include family members and significant others in education/counseling sessions.    Expected Outcomes Short Term: Participant demonstrates changes in health-related behavior, relaxation and other stress management skills, ability to obtain effective social support, and compliance with psychotropic  medications if prescribed.;Long Term: Emotional wellbeing is indicated by absence of clinically significant psychosocial distress or  social isolation.    Personal Goal Other Yes    Personal Goal short and long term: better health, enjoy life more, gain strength    Intervention Will continue to monitor pt and progress workloads as tolerated without sign or symptom    Expected Outcomes Pt will achieve his goals in a safe way             Core Components/Risk Factors/Patient Goals Review:   Goals and Risk Factor Review     Row Name 06/05/21 1625 07/03/21 1526           Core Components/Risk Factors/Patient Goals Review   Personal Goals Review Weight Management/Obesity;Heart Failure;Stress;Hypertension;Lipids Weight Management/Obesity;Heart Failure;Stress;Hypertension;Lipids      Review Aries has done well with exercise at cardiac rehab when in attendance. Vital signs have been stable. Yuki continues to smoke and does not plan to stop currently Doc has done well with exercise at cardiac rehab when in attendance. Vital signs have been stable. Xayvier continues to smoke and does not plan to stop currently. Vidyuth will complete phase 2 cardiac rehab on 07/13/21.      Expected Outcomes Mithcell will continue to participate in phase 2 cardiac rehab for exercise, nutrition and lifestyle modifications. Kaian will continue to participate in phase 2 cardiac rehab for exercise, nutrition and lifestyle modifications.               Core Components/Risk Factors/Patient Goals at Discharge (Final Review):   Goals and Risk Factor Review - 07/03/21 1526       Core Components/Risk Factors/Patient Goals Review   Personal Goals Review Weight Management/Obesity;Heart Failure;Stress;Hypertension;Lipids    Review Farren has done well with exercise at cardiac rehab when in attendance. Vital signs have been stable. Yobani continues to smoke and does not plan to stop currently. Carlo will complete  phase 2 cardiac rehab on 07/13/21.    Expected Outcomes Elton will continue to participate in phase 2 cardiac rehab for exercise, nutrition and lifestyle modifications.             ITP Comments:  ITP Comments     Row Name 05/17/21 1149 06/05/21 1619 07/03/21 1524       ITP Comments Dr. Fransico Him MD, Medical Director 30 Day ITP Review. Beckam has good participation in phase 2 cardiac rehab. 30 Day ITP Review. Pasco  continues to have  good participation in phase 2 cardiac rehab.              Comments: See ITP Comments

## 2021-07-04 ENCOUNTER — Other Ambulatory Visit: Payer: Self-pay

## 2021-07-04 ENCOUNTER — Encounter (HOSPITAL_COMMUNITY)
Admission: RE | Admit: 2021-07-04 | Discharge: 2021-07-04 | Disposition: A | Discharge status: Home / Self Care | Payer: 59 | Source: Ambulatory Visit | Attending: Cardiovascular Disease | Admitting: Cardiovascular Disease

## 2021-07-04 ENCOUNTER — Encounter (HOSPITAL_COMMUNITY): Payer: 59

## 2021-07-04 DIAGNOSIS — I5022 Chronic systolic (congestive) heart failure: Secondary | ICD-10-CM

## 2021-07-06 ENCOUNTER — Encounter (HOSPITAL_COMMUNITY): Payer: 59

## 2021-07-09 ENCOUNTER — Encounter (HOSPITAL_COMMUNITY): Payer: 59

## 2021-07-09 ENCOUNTER — Other Ambulatory Visit: Payer: Self-pay

## 2021-07-09 ENCOUNTER — Encounter (HOSPITAL_COMMUNITY)
Admission: RE | Admit: 2021-07-09 | Discharge: 2021-07-09 | Disposition: A | Discharge status: Home / Self Care | Payer: 59 | Source: Ambulatory Visit | Attending: Cardiovascular Disease | Admitting: Cardiovascular Disease

## 2021-07-09 DIAGNOSIS — I5022 Chronic systolic (congestive) heart failure: Secondary | ICD-10-CM | POA: Insufficient documentation

## 2021-07-11 ENCOUNTER — Other Ambulatory Visit: Payer: Self-pay

## 2021-07-11 ENCOUNTER — Encounter (HOSPITAL_COMMUNITY): Payer: 59

## 2021-07-11 ENCOUNTER — Encounter (HOSPITAL_COMMUNITY)
Admission: RE | Admit: 2021-07-11 | Discharge: 2021-07-11 | Disposition: A | Discharge status: Home / Self Care | Payer: 59 | Source: Ambulatory Visit | Attending: Cardiovascular Disease | Admitting: Cardiovascular Disease

## 2021-07-11 VITALS — Ht 73.5 in

## 2021-07-11 DIAGNOSIS — I5022 Chronic systolic (congestive) heart failure: Secondary | ICD-10-CM

## 2021-07-13 ENCOUNTER — Encounter (HOSPITAL_COMMUNITY): Payer: 59

## 2021-07-13 ENCOUNTER — Other Ambulatory Visit: Payer: Self-pay

## 2021-07-13 ENCOUNTER — Encounter (HOSPITAL_COMMUNITY)
Admission: RE | Admit: 2021-07-13 | Discharge: 2021-07-13 | Disposition: A | Discharge status: Home / Self Care | Payer: 59 | Source: Ambulatory Visit | Attending: Cardiovascular Disease | Admitting: Cardiovascular Disease

## 2021-07-13 ENCOUNTER — Telehealth (HOSPITAL_COMMUNITY): Payer: Self-pay | Admitting: Surgery

## 2021-07-13 DIAGNOSIS — I5022 Chronic systolic (congestive) heart failure: Secondary | ICD-10-CM

## 2021-07-13 NOTE — Progress Notes (Signed)
Discharge Progress Report  Patient Details  Name: Richard Ritchey MRN: [id number] Date of Birth: 1961-04-23 Referring Provider:   Flowsheet Row CARDIAC REHAB PHASE II ORIENTATION from 05/17/2021 in [place]  Referring Provider Dr. Kathlyn Sacramento MD        Number of Visits: 19  Reason for Discharge:  Patient reached a stable level of exercise. Patient independent in their exercise. Patient has met program and personal goals.  Smoking History:  Social History   Tobacco Use  Smoking Status Every Day   Packs/day: 1.50   Years: 25.00   Pack years: 37.50   Types: Cigarettes  Smokeless Tobacco Current   Types: Chew  Tobacco Comments   05/17/21 Ronalee Belts has cut down smoking to 1/2 to 1 ppd.  Not ready to quit.    Diagnosis:  Heart failure, chronic systolic (HCC)  ADL [facility]:   Initial Exercise Prescription:  Initial Exercise Prescription - 05/17/21 1200       Date of Initial Exercise RX and Referring Provider   Date 05/17/21    Referring Provider Dr. Kathlyn Sacramento MD    Expected Discharge Date 07/13/21      NuStep   Level 2    SPM 75    Minutes 30    METs 2      Prescription Details   Frequency (times per week) 3    Duration Progress to 30 minutes of continuous aerobic without signs/symptoms of physical distress      Intensity   THRR 40-80% of Max Heartrate 65-130    Ratings of Perceived Exertion 11-13    Perceived Dyspnea 0-4      Progression   Progression Continue progressive overload as per policy without signs/symptoms or physical distress.      Resistance Training   Training Prescription Yes    Weight 3    Reps 10-15             Discharge Exercise Prescription (Final Exercise Prescription Changes):  Exercise Prescription Changes - 07/13/21 1600       Response to Exercise   Blood Pressure (Admit) 152/90    Blood Pressure (Exercise) 150/68    Blood Pressure (Exit) 132/86    Heart Rate (Admit) 63 bpm     Heart Rate (Exercise) 82 bpm    Heart Rate (Exit) 66 bpm    Rating of Perceived Exertion (Exercise) 11    Symptoms None    Comments Pt graduated from the CRP2 program    Duration Continue with 30 min of aerobic exercise without signs/symptoms of physical distress.    Intensity THRR unchanged      Progression   Progression Continue to progress workloads to maintain intensity without signs/symptoms of physical distress.    Average METs 2.3      Resistance Training   Training Prescription Yes    Weight 3    Reps 10-15    Time 10 Minutes      Interval Training   Interval Training No      NuStep   Level 4    SPM 80    Minutes 30    METs 2.3      Home Exercise Plan   Plans to continue exercise at Home (comment)    Frequency Add 2 additional days to program exercise sessions.    Initial Home Exercises Provided 06/08/21             Functional Capacity:  6 Minute Walk  [place] Name 05/17/21 1155 07/04/21 1310       6 Minute Walk   Phase Initial Discharge    Distance 1169 feet 1504 feet    Distance % Change -- 28.66 %    Distance Feet Change -- 335 ft    Walk Time 6 minutes 6 minutes    # of Rest Breaks 0 0    MPH 2.21 2.85    METS 3.64 4.2    RPE 11 12    Perceived Dyspnea  0 0    VO2 Peak 12.75 14.71    Symptoms Yes (comment) Yes (comment)    Comments right leg fatigue/tightness and  2/10 chest tightness right leg fatigue/tightness 5/10    Resting HR 59 bpm 63 bpm    Resting BP 150/87 142/82    Resting Oxygen Saturation  98 % --    Exercise Oxygen Saturation  during 6 min walk 100 % --    Max Ex. HR 1.84 bpm 86 bpm    Max Ex. BP 160/87 162/90    2 Minute Post BP 146/86 --             Psychological, QOL, Others - Outcomes: PHQ 2/9: Depression screen [facility] 2/9 07/13/2021 05/17/2021 10/12/2020 09/29/2017  Decreased Interest '1 1 1 ' 0  Down, Depressed, Hopeless 0 2 1 0  PHQ - 2 Score '1 3 2 ' 0  Altered sleeping - 2 0 -  Tired, decreased energy - 1 2 -   Change in appetite - 0 0 -  Feeling bad or failure about yourself  - 1 2 -  Trouble concentrating - 1 2 -  Moving slowly or fidgety/restless - 1 2 -  Suicidal thoughts - 0 0 -  PHQ-9 Score - 9 10 -  Difficult doing work/chores - Somewhat difficult Somewhat difficult -    Quality of Life:  Quality of Life - 07/13/21 1429       Quality of Life   Select Quality of Life      Quality of Life Scores   Health/Function Pre 7.9 %    Health/Function Post 16.54 %    Health/Function % Change 109.37 %    Socioeconomic Pre 11.13 %    Socioeconomic Post 12.43 %    Socioeconomic % Change  11.68 %    Psych/Spiritual Pre 10.5 %    Psych/Spiritual Post 17.5 %    Psych/Spiritual % Change 66.67 %    Family Pre 10.8 %    Family Post 14.33 %    Family % Change 32.69 %    GLOBAL Pre 9.75 %    GLOBAL Post 15.61 %    GLOBAL % Change 60.1 %             Personal Goals: Goals established at orientation with interventions provided to work toward goal.  Personal Goals and Risk Factors at Admission - 05/17/21 1305       Core Components/Risk Factors/Patient Goals on Admission    Weight Management Yes    Tobacco Cessation Yes    Number of packs per day 1    Intervention Assist the participant in steps to quit. Provide individualized education and counseling about committing to Tobacco Cessation, relapse prevention, and pharmacological support that can be provided by physician.;Advice worker, assist with locating and accessing local/national Quit Smoking programs, and support quit date choice.    Expected Outcomes Short Term: Will demonstrate readiness to quit, by selecting a quit date.;Short Term: Will quit all  tobacco product use, adhering to prevention of relapse plan.;Long Term: Complete abstinence from all tobacco products for at least 12 months from quit date.    Heart Failure Yes    Intervention Provide a combined exercise and nutrition program that is supplemented with  education, support and counseling about heart failure. Directed toward relieving symptoms such as shortness of breath, decreased exercise tolerance, and extremity edema.    Expected Outcomes Improve functional capacity of life;Short term: Attendance in program 2-3 days a week with increased exercise capacity. Reported lower sodium intake. Reported increased fruit and vegetable intake. Reports medication compliance.;Short term: Daily weights obtained and reported for increase. Utilizing diuretic protocols set by physician.;Long term: Adoption of self-care skills and reduction of barriers for early signs and symptoms recognition and intervention leading to self-care maintenance.    Hypertension Yes    Intervention Provide education on lifestyle modifcations including regular physical activity/exercise, weight management, moderate sodium restriction and increased consumption of fresh fruit, vegetables, and low fat dairy, alcohol moderation, and smoking cessation.;Monitor prescription use compliance.    Expected Outcomes Short Term: Continued assessment and intervention until BP is < 140/22m HG in hypertensive participants. < 130/868mHG in hypertensive participants with diabetes, heart failure or chronic kidney disease.;Long Term: Maintenance of blood pressure at goal levels.    Lipids Yes    Intervention Provide education and support for participant on nutrition & aerobic/resistive exercise along with prescribed medications to achieve LDL <7070mHDL >63m66m  Expected Outcomes Short Term: Participant states understanding of desired cholesterol values and is compliant with medications prescribed. Participant is following exercise prescription and nutrition guidelines.;Long Term: Cholesterol controlled with medications as prescribed, with individualized exercise RX and with personalized nutrition plan. Value goals: LDL < 70mg17mL > 40 mg.    Stress Yes    Intervention Offer individual and/or small group  education and counseling on adjustment to heart disease, stress management and health-related lifestyle change. Teach and support self-help strategies.;Refer participants experiencing significant psychosocial distress to appropriate mental health specialists for further evaluation and treatment. When possible, include family members and significant others in education/counseling sessions.    Expected Outcomes Short Term: Participant demonstrates changes in health-related behavior, relaxation and other stress management skills, ability to obtain effective social support, and compliance with psychotropic medications if prescribed.;Long Term: Emotional wellbeing is indicated by absence of clinically significant psychosocial distress or social isolation.    Personal Goal Other Yes    Personal Goal short and long term: better health, enjoy life more, gain strength    Intervention Will continue to monitor pt and progress workloads as tolerated without sign or symptom    Expected Outcomes Pt will achieve his goals in a safe way              Personal Goals Discharge:  Goals and Risk Factor Review     Row Name 06/05/21 1625 07/03/21 1526           Core Components/Risk Factors/Patient Goals Review   Personal Goals Review Weight Management/Obesity;Heart Failure;Stress;Hypertension;Lipids Weight Management/Obesity;Heart Failure;Stress;Hypertension;Lipids      Review MichaKaidencedone well with exercise at cardiac rehab when in attendance. Vital signs have been stable. MichaJaniinues to smoke and does not plan to stop currently MichaPaldone well with exercise at cardiac rehab when in attendance. Vital signs have been stable. MichaBergeninues to smoke and does not plan to stop currently. MichaTran complete phase 2 cardiac rehab on 07/13/21.  Expected Outcomes Parsa will continue to participate in phase 2 cardiac rehab for exercise, nutrition and lifestyle modifications. Ronan will continue  to participate in phase 2 cardiac rehab for exercise, nutrition and lifestyle modifications.               Exercise Goals and Review:  Exercise Goals     Row Name 05/17/21 1203             Exercise Goals   Increase Physical Activity Yes       Intervention Provide advice, education, support and counseling about physical activity/exercise needs.;Develop an individualized exercise prescription for aerobic and resistive training based on initial evaluation findings, risk stratification, comorbidities and participant's personal goals.       Expected Outcomes Short Term: Attend rehab on a regular basis to increase amount of physical activity.;Long Term: Add in home exercise to make exercise part of routine and to increase amount of physical activity.;Long Term: Exercising regularly at least 3-5 days a week.       Increase Strength and Stamina Yes       Intervention Provide advice, education, support and counseling about physical activity/exercise needs.;Develop an individualized exercise prescription for aerobic and resistive training based on initial evaluation findings, risk stratification, comorbidities and participant's personal goals.       Expected Outcomes Short Term: Increase workloads from initial exercise prescription for resistance, speed, and METs.;Short Term: Perform resistance training exercises routinely during rehab and add in resistance training at home;Long Term: Improve cardiorespiratory fitness, muscular endurance and strength as measured by increased METs and functional capacity (6MWT)       Able to understand and use rate of perceived exertion (RPE) scale Yes       Intervention Provide education and explanation on how to use RPE scale       Expected Outcomes Short Term: Able to use RPE daily in rehab to express subjective intensity level;Long Term:  Able to use RPE to guide intensity level when exercising independently       Knowledge and understanding of Target Heart Rate  Range (THRR) Yes       Intervention Provide education and explanation of THRR including how the numbers were predicted and where they are located for reference       Expected Outcomes Short Term: Able to state/look up THRR;Long Term: Able to use THRR to govern intensity when exercising independently;Short Term: Able to use daily as guideline for intensity in rehab       Understanding of Exercise Prescription Yes       Intervention Provide education, explanation, and written materials on patient's individual exercise prescription       Expected Outcomes Short Term: Able to explain program exercise prescription;Long Term: Able to explain home exercise prescription to exercise independently                Exercise Goals Re-Evaluation:  Exercise Goals Re-Evaluation     Row Name 05/21/21 1500 06/08/21 1637 06/15/21 1500 07/13/21 1609       Exercise Goal Re-Evaluation   Exercise Goals Review Increase Physical Activity;Increase Strength and Stamina;Able to understand and use rate of perceived exertion (RPE) scale;Knowledge and understanding of Target Heart Rate Range (THRR);Understanding of Exercise Prescription Increase Physical Activity;Increase Strength and Stamina;Able to understand and use rate of perceived exertion (RPE) scale;Knowledge and understanding of Target Heart Rate Range (THRR);Understanding of Exercise Prescription Increase Physical Activity;Increase Strength and Stamina;Able to understand and use rate of perceived exertion (RPE) scale;Knowledge and understanding  of Target Heart Rate Range (THRR);Able to check pulse independently;Understanding of Exercise Prescription Increase Physical Activity;Increase Strength and Stamina;Able to understand and use rate of perceived exertion (RPE) scale;Knowledge and understanding of Target Heart Rate Range (THRR);Able to check pulse independently    Comments Pt's first day in the CRP2 program. Pt understands the exercise Rx, THRR, and RPE scale.  Reviewed home exercise Rx with patient today. Pt not exercsing at home. Encouraged patient to bgin walking 2x/week working up to 30 minutes on of days from the [place] program. Reviewed METs and goals. Pt making slow progress on improving MET level. Pt does feel somewhat stronger. Continue to encourage walking on off days from the [place] program. Pt graduated from the [place] program. Pt had a peak MET level of 2.3. Pt voices that he feels stonger and has less pain in his legs when walking. Pt has not been consistant with walking at home on off days from [place] program. Continue to encourage patient to exercise at home by walking or other mode of patient's choice. Pt improved by 28.66% (377f) over baseline on the post 6-minute walk test.    Expected Outcomes Will continue to monitor patient and progress exercise workloads as tolerated. Pt will walk at home 2x/week. Will contiue to montior patient and progress exercise workloads as tolerated, Pt will exercise at home on his own.             Nutrition & Weight - Outcomes:  Pre Biometrics - 05/17/21 1154       Pre Biometrics   Waist Circumference 41.25 inches    Hip Circumference 42 inches    Waist to Hip Ratio 0.98 %    Triceps Skinfold 14 mm    % Body Fat 26.3 %    Grip Strength 35 kg    Flexibility 11.5 in    Single Leg Stand 13.5 seconds             Post Biometrics - 07/11/21 1421        Post  Biometrics   Height 6' 1.5" (1.867 m)    Waist Circumference 41.5 inches    Hip Circumference 42 inches    Waist to Hip Ratio 0.99 %    Triceps Skinfold 13 mm    % Body Fat 26.1 %    Grip Strength 46 kg    Flexibility 11 in    Single Leg Stand 12.81 seconds             Nutrition:   Nutrition Discharge:   Education Questionnaire Score:  Knowledge Questionnaire Score - 07/13/21 1430       Knowledge Questionnaire Score   Post Score 20/24             Goals reviewed with patient; copy given to patient.Pt graduated from  cardiac rehab program on 07/13/21 with completion of  exercise sessions in Phase II. Pt maintained good attendance and progressed nicely during his participation in rehab as evidenced by increased MET level.   Medication list reconciled. Repeat  PHQ score- 1 .  Pt has made significant lifestyle changes and should be commended for his success. Encouraged MRonalee Beltscontinues to smoke although he says he has cut back.Pt feels he has achieved his goals during cardiac rehab.MRonalee Beltsfound the program to be helpful. MRonalee Beltswas encouraged to continue exercising upon completion of phase 2 cardiac rehab. MRonalee Beltsmay consider joining a gym such as [employer]. MRonalee Beltsincreased his distance on his post exercise walk test by  335 feet. We are proud of Mike's progress! Ronalee Belts continues to smoke and does not plan to stop smoking at this time. Smoking cessation was encouraged.Barnet Pall, RN,BSN 07/20/2021 3:44 PM

## 2021-07-13 NOTE — Telephone Encounter (Signed)
Patient returned home sleep study device to [facility].

## 2021-07-13 NOTE — Telephone Encounter (Signed)
I attempted to contact patient with no answer.  I left a message to indicate that per our records he has not completed the study and to inform him that he will be charged for the home sleep device if he does not return it to our office.

## 2021-07-30 ENCOUNTER — Other Ambulatory Visit: Payer: Self-pay | Admitting: Cardiology

## 2021-07-30 DIAGNOSIS — I4891 Unspecified atrial fibrillation: Secondary | ICD-10-CM

## 2021-07-31 NOTE — Telephone Encounter (Signed)
Prescription refill request for Eliquis received. Indication:  a fib Last office visit: 06/12/21 Scr:1.03 Age: 60 Weight: 89kg

## 2021-08-21 ENCOUNTER — Ambulatory Visit: Payer: 59 | Admitting: Family Medicine

## 2021-08-21 ENCOUNTER — Other Ambulatory Visit: Payer: Self-pay

## 2021-08-21 ENCOUNTER — Encounter: Payer: Self-pay | Admitting: Family Medicine

## 2021-08-21 DIAGNOSIS — I483 Typical atrial flutter: Secondary | ICD-10-CM | POA: Diagnosis not present

## 2021-08-21 DIAGNOSIS — F1020 Alcohol dependence, uncomplicated: Secondary | ICD-10-CM

## 2021-08-21 DIAGNOSIS — F17200 Nicotine dependence, unspecified, uncomplicated: Secondary | ICD-10-CM | POA: Diagnosis not present

## 2021-08-21 DIAGNOSIS — I739 Peripheral vascular disease, unspecified: Secondary | ICD-10-CM | POA: Diagnosis not present

## 2021-08-21 DIAGNOSIS — I10 Essential (primary) hypertension: Secondary | ICD-10-CM | POA: Diagnosis not present

## 2021-08-21 DIAGNOSIS — I5021 Acute systolic (congestive) heart failure: Secondary | ICD-10-CM

## 2021-08-24 ENCOUNTER — Telehealth: Payer: Self-pay

## 2021-08-24 ENCOUNTER — Encounter: Payer: Self-pay | Admitting: Family Medicine

## 2021-08-24 DIAGNOSIS — I739 Peripheral vascular disease, unspecified: Secondary | ICD-10-CM | POA: Insufficient documentation

## 2021-08-24 NOTE — Telephone Encounter (Signed)
Scanned documents into patient's chart and also LVM that paperwork has been completed and is ready for pick up. Placed in accordion waiting for patient pick up. AM

## 2021-08-24 NOTE — Telephone Encounter (Signed)
FMLA documentation has been completed by Dr. [name].   Please contact the patient for pick-up and scan to chart.   Given to [employer]

## 2021-08-26 DIAGNOSIS — I5020 Unspecified systolic (congestive) heart failure: Secondary | ICD-10-CM | POA: Insufficient documentation

## 2021-08-26 NOTE — Assessment & Plan Note (Signed)
Counseled on smoking cessation.  Encouraged to continue to quit

## 2021-08-26 NOTE — Assessment & Plan Note (Signed)
He has been able to cut back on alcohol use.  Encouraged to quit drinking.

## 2021-08-26 NOTE — Assessment & Plan Note (Signed)
Ejection fraction and symptoms improved with medical management.  We will continue management per cardiology.

## 2021-08-26 NOTE — Progress Notes (Signed)
Christopher Gutierrez - 61 y.o. male MRN [id number]  Date of birth: Sep 07, 1960  Subjective No chief complaint on file.   HPI Christopher Gutierrez is a 61 year old male here today for follow-up visit.  Reports he is feeling good today.  I have not seen him in several months however he has been seeing cardiology for management of his atrial fibrillation and CHF.  Initially noted to have ejection fraction of 20 to 25% with global hypokinesis.  As of 01/22/2021 his EF has improved to 45 to 50% with medical management.  He has been discharged from advanced CHF clinic but continues to see cardiologist.  Current management with combination of Entresto, Aldactone and bisoprolol. Marland Kitchen  He is anticoagulated with Eliquis.  He has cut back on his alcohol use however he continues to smoke.  H in addition to eliquis he is also taking Plavix for peripheral arterial disease.  He denies significant claudication at this time.  He does need FMLA paperwork filled out for his job due to fatigue that he experiences while working.  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   Hypertension     Past Surgical History:  Procedure Laterality Date   ABDOMINAL AORTOGRAM W/LOWER EXTREMITY N/A 03/07/2021   Procedure: ABDOMINAL AORTOGRAM W/LOWER EXTREMITY;  Surgeon: Wellington Hampshire, MD;  Location: [facility];  Service: Cardiovascular;  Laterality: N/A;   CARDIOVERSION N/A 11/01/2020   Procedure: CARDIOVERSION;  Surgeon: Jerline Pain, MD;  Location: [facility] ENDOSCOPY;  Service: Cardiovascular;  Laterality: N/A;   CATARACT EXTRACTION Right    MOUTH SURGERY   2013   PERIPHERAL VASCULAR INTERVENTION Left 03/07/2021   Procedure: PERIPHERAL VASCULAR INTERVENTION;  Surgeon: Wellington Hampshire, MD;  Location: [facility];  Service: Cardiovascular;  Laterality: Left;  left SFA   TEE WITHOUT CARDIOVERSION N/A 11/01/2020   Procedure: TRANSESOPHAGEAL ECHOCARDIOGRAM (TEE);  Surgeon: Jerline Pain, MD;  Location: [facility] ENDOSCOPY;  Service: Cardiovascular;  Laterality: N/A;    Social History   Socioeconomic History   Marital status: Divorced    Spouse name: Not on file   Number of children: 1   Years of education: Not on file   Highest education level: Master's degree (e.g., MA, MS, MEng, MEd, MSW, MBA)  Occupational History   Not on file  Tobacco Use   Smoking status: Every Day    Packs/day: 1.50    Years: 25.00    Pack years: 37.50    Types: Cigarettes   Smokeless tobacco: Current    Types: Chew   Tobacco comments:    05/17/21 Ronalee Belts has cut down smoking to 1/2 to 1 ppd.  Not ready to quit.  Vaping Use   Vaping Use: Never used  Substance and Sexual Activity   Alcohol use: Yes    Alcohol/week: 24.0 standard drinks    Types: 24 Cans of beer per week    Comment: 6 cans every other day   Drug use: Yes    Frequency: 2.0 times per week    Types: Marijuana    Comment: he  took Marijuana 2 weeks ago   Sexual activity: Yes    Partners: Female  Other Topics Concern   Not on file  Social History Narrative   Not on file   Social Determinants of Health   Financial Resource Strain: Low Risk    Difficulty of Paying Living Expenses: Not hard at all  Food Insecurity: No Food Insecurity   Worried About Charity fundraiser in the Last Year: Never true   Arboriculturist in the Last Year: Never true  Transportation Needs: No Transportation Needs   Lack of Transportation (Medical): No   Lack of Transportation (Non-Medical): No  Physical Activity: Not on file  Stress: Not on file  Social Connections: Not on file    Family History  Problem  Relation Age of Onset   Gout Brother    Hypertension Brother    COPD Maternal Uncle    Stroke Paternal Aunt    Birth defects Paternal Aunt     Health Maintenance  Topic Date Due   Hepatitis C Screening  Never done   COLONOSCOPY (Pts 45-55yrs Insurance coverage will need to be confirmed)  Never done   COVID-19 Vaccine (3 - Pfizer risk series) 01/03/2020   TETANUS/TDAP  10/12/2021 (Originally 04/18/1982)   INFLUENZA VACCINE  11/02/2021 (Originally 03/05/2021)   Zoster Vaccines- Shingrix (1 of 2) 11/19/2021 (Originally 04/18/1982)   HIV Screening  Completed   HPV VACCINES  Aged Out     ----------------------------------------------------------------------------------------------------------------------------------------------------------------------------------------------------------------- Physical Exam BP (!) 155/90 (BP Location: Left Arm, Patient Position: Sitting, Cuff Size: Normal)    Pulse 70    Temp 97.8 F (36.6 C)    Ht 6\' 2"  (1.88 m)    Wt 198 lb (89.8 kg)    SpO2 98%    BMI 25.42 kg/m   Physical Exam Constitutional:      Appearance: Normal appearance.  Eyes:     General: No scleral icterus. Cardiovascular:     Rate and Rhythm: Normal rate and regular rhythm.     Pulses: Normal pulses.     Heart sounds: Normal heart sounds.  Musculoskeletal:     Cervical back: Neck supple.  Neurological:     General: No focal deficit present.     Mental Status: He is alert.  Psychiatric:        Mood and Affect: Mood normal.        Behavior: Behavior normal.    ------------------------------------------------------------------------------------------------------------------------------------------------------------------------------------------------------------------- Assessment and Plan  PAD (peripheral artery disease) ([place]) Managed with daily Plavix.  Encouraged to quit smoking and walk frequently.  Essential hypertension Blood pressure elevated on exam today.  Asked that he  continue to monitor this at home.  Atrial flutter ([facility]) Rate control bisoprolol.  Anticoagulated with Eliquis.  He will continue management per cardiology.  Tobacco use disorder Counseled on smoking cessation.  Encouraged to continue to quit  Uncomplicated alcohol dependence ([place]) He has been able to cut back on alcohol use.  Encouraged to quit drinking.  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  We will continue management per cardiology.   No orders of the defined types were placed in this encounter.   No follow-ups on file.    This visit occurred during the SARS-CoV-2 public health emergency.  Safety protocols were in place, including screening questions prior to the visit, additional usage of staff PPE, and extensive cleaning of exam room while observing appropriate contact time as indicated for disinfecting solutions.

## 2021-08-26 NOTE — Assessment & Plan Note (Signed)
Blood pressure elevated on exam today.  Asked that he continue to monitor this at home.

## 2021-08-26 NOTE — Assessment & Plan Note (Signed)
Managed with daily Plavix.  Encouraged to quit smoking and walk frequently.

## 2021-08-26 NOTE — Assessment & Plan Note (Signed)
Rate control bisoprolol.  Anticoagulated with Eliquis.  He will continue management per cardiology.

## 2021-09-01 ENCOUNTER — Encounter (HOSPITAL_COMMUNITY): Payer: Self-pay | Admitting: Emergency Medicine

## 2021-09-01 ENCOUNTER — Emergency Department (HOSPITAL_COMMUNITY): Payer: 59

## 2021-09-01 ENCOUNTER — Emergency Department (HOSPITAL_COMMUNITY)
Admission: EM | Admit: 2021-09-01 | Discharge: 2021-09-01 | Disposition: A | Discharge status: Home / Self Care | Payer: 59 | Attending: Emergency Medicine | Admitting: Emergency Medicine

## 2021-09-01 DIAGNOSIS — I4891 Unspecified atrial fibrillation: Secondary | ICD-10-CM | POA: Insufficient documentation

## 2021-09-01 DIAGNOSIS — R002 Palpitations: Secondary | ICD-10-CM | POA: Diagnosis present

## 2021-09-01 LAB — CBC WITH DIFFERENTIAL/PLATELET
Abs Immature Granulocytes: 0.07 10*3/uL (ref 0.00–0.07)
Basophils Absolute: 0.1 10*3/uL (ref 0.0–0.1)
Basophils Relative: 1 %
Eosinophils Absolute: 0.1 10*3/uL (ref 0.0–0.5)
Eosinophils Relative: 1 %
HCT: 51.3 % (ref 39.0–52.0)
Hemoglobin: 17.7 g/dL — ABNORMAL HIGH (ref 13.0–17.0)
Immature Granulocytes: 1 %
Lymphocytes Relative: 14 %
Lymphs Abs: 1.4 10*3/uL (ref 0.7–4.0)
MCH: 34 pg (ref 26.0–34.0)
MCHC: 34.5 g/dL (ref 30.0–36.0)
MCV: 98.5 fL (ref 80.0–100.0)
Monocytes Absolute: 0.7 10*3/uL (ref 0.1–1.0)
Monocytes Relative: 7 %
Neutro Abs: 7.6 10*3/uL (ref 1.7–7.7)
Neutrophils Relative %: 76 %
Platelets: 172 10*3/uL (ref 150–400)
RBC: 5.21 MIL/uL (ref 4.22–5.81)
RDW: 11.9 % (ref 11.5–15.5)
WBC: 9.9 10*3/uL (ref 4.0–10.5)
nRBC: 0 % (ref 0.0–0.2)

## 2021-09-01 LAB — COMPREHENSIVE METABOLIC PANEL
ALT: 32 U/L (ref 0–44)
AST: 29 U/L (ref 15–41)
Albumin: 3.9 g/dL (ref 3.5–5.0)
Alkaline Phosphatase: 81 U/L (ref 38–126)
Anion gap: 9 (ref 5–15)
BUN: 12 mg/dL (ref 6–20)
CO2: 23 mmol/L (ref 22–32)
Calcium: 9 mg/dL (ref 8.9–10.3)
Chloride: 107 mmol/L (ref 98–111)
Creatinine, Ser: 1.02 mg/dL (ref 0.61–1.24)
GFR, Estimated: >60 mL/min (ref >60)
Glucose, Bld: 135 mg/dL — ABNORMAL HIGH (ref 70–99)
Potassium: 4.2 mmol/L (ref 3.5–5.1)
Sodium: 139 mmol/L (ref 135–145)
Total Bilirubin: 1.1 mg/dL (ref 0.3–1.2)
Total Protein: 6.5 g/dL (ref 6.5–8.1)

## 2021-09-01 LAB — MAGNESIUM: Magnesium: 2.2 mg/dL (ref 1.7–2.4)

## 2021-09-01 MED ORDER — PROPOFOL 10 MG/ML IV BOLUS
2.0000 mg/kg | Freq: Once | INTRAVENOUS | Status: DC
  Filled 2021-09-01: qty 20

## 2021-09-01 MED ORDER — LACTATED RINGERS IV BOLUS
500.0000 mL | Freq: Once | INTRAVENOUS | Status: AC
  Administered 2021-09-01: 500 mL via INTRAVENOUS

## 2021-09-01 MED ORDER — PROPOFOL 10 MG/ML IV BOLUS
INTRAVENOUS | Status: AC | PRN
Start: 2021-09-01 — End: 2021-09-01
  Administered 2021-09-01: 90 mg via INTRAVENOUS

## 2021-09-01 MED ORDER — DILTIAZEM HCL 25 MG/5ML IV SOLN
20.0000 mg | Freq: Once | INTRAVENOUS | Status: AC
  Administered 2021-09-01: 20 mg via INTRAVENOUS
  Filled 2021-09-01: qty 5

## 2021-09-01 MED ORDER — FENTANYL CITRATE PF 50 MCG/ML IJ SOSY
50.0000 ug | PREFILLED_SYRINGE | Freq: Once | INTRAMUSCULAR | Status: AC
  Administered 2021-09-01: 50 ug via INTRAVENOUS
  Filled 2021-09-01: qty 1

## 2021-09-01 NOTE — ED Notes (Signed)
RN provided phone number for [employer]. Pt called taxi cab for ride home.

## 2021-09-01 NOTE — ED Triage Notes (Addendum)
Pt bib EMS from home, felt possible heart palpitations that began around 2200. No acute pain, denies N/V/D, denies SOB. Pt diagnosed with a fib, has not missed any home medication dosages. HR 140-150 with EMS.  355mL NS given by EMS via 18G IV in L AC vein.  EMS vitals: 138/96 97% room air HR 140-152

## 2021-09-01 NOTE — ED Notes (Signed)
E-signature pad unavailable at time of pt discharge. This RN discussed discharge materials with pt and answered all pt questions. Pt stated understanding of discharge material. ? ?

## 2021-09-01 NOTE — Progress Notes (Signed)
RT to bedside for conscious sedation standby. Pt on 4L for procedure, EtCO2 30-35 range pre/post procedure. No other respiratory interventions required.

## 2021-09-01 NOTE — Sedation Documentation (Signed)
Shock delivered by Dr. Dayna Barker. Heart rate 84, normal sinus. EKG captured.

## 2021-09-01 NOTE — ED Provider Notes (Signed)
Emergency Department Provider Note  I have reviewed the triage vital signs and the nursing notes.  HISTORY  Chief Complaint A fib RVR   HPI Christopher Gutierrez is a 61 y.o. male with  palpitations that were irregular starting around 2200 while watching TV. Had a few beers earlier around 1600 but not intoxicated. No drugs. Smokes cigarettes. Eating and drinking normal. No recent illnesses. Cardioverted last year. Compliant with meds including anticoagulants. No dyspnea.   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 Authorizing Provider  acetaminophen (TYLENOL) 325 MG tablet Take 650 mg by mouth every 6 (six) hours as needed for moderate pain or headache.   Yes [provider]  apixaban (ELIQUIS) 5 MG TABS tablet TAKE 1 TABLET BY MOUTH TWICE A DAY Patient taking differently: Take 5 mg by mouth 2 (two) times daily. 07/31/21  Yes [name], MD  bisoprolol (ZEBETA) 5 MG tablet Take 0.5 tablets (2.5 mg total) by mouth daily. 12/11/20  Yes Bensimhon, Shaune Pascal, MD  cetirizine (ZYRTEC) 10 MG tablet Take 10 mg by mouth as needed for allergies.   Yes [provider]  clopidogrel (PLAVIX) 75 MG tablet Take 1 tablet (75 mg total) by mouth daily. 03/07/21 10/03/21 Yes Cheryln Manly, NP  dapagliflozin propanediol (FARXIGA) 10 MG TABS tablet Take 1 tablet (10 mg total) by mouth daily before breakfast. 04/30/21  Yes Milford, Maricela Bo, FNP  Famotidine-Ca Carb-Mag Hydrox (PEPCID COMPLETE PO) Take 1 tablet by mouth daily as needed (acid reflux).   Yes [provider]  rosuvastatin (CRESTOR) 20 MG tablet Take 1 tablet (20 mg total) by mouth daily. 01/26/21  Yes Bensimhon, Shaune Pascal, MD  sacubitril-valsartan  (ENTRESTO) 97-103 MG Take 1 tablet by mouth 2 (two) times daily. 01/26/21  Yes Bensimhon, Shaune Pascal, MD  spironolactone (ALDACTONE) 25 MG tablet Take 0.5 tablets (12.5 mg total) by mouth daily. 06/12/21  Yes Wellington Hampshire, MD  traMADol (ULTRAM) 50 MG tablet Take 50 mg by mouth every 6 (six) hours as needed for moderate pain.   Yes [provider]  digoxin (LANOXIN) 0.125 MG tablet Take 125 mcg by mouth daily. Patient not taking: Reported on 09/01/2021 08/09/21   [provider]  Nicotine 10 MG/ML SOLN 1-2 puffs as needed Patient not taking: Reported on 07/13/2021 01/26/21   Bensimhon, Shaune Pascal, MD  nicotine polacrilex (COMMIT) 4 MG lozenge Take 4 mg by mouth as needed for smoking cessation. Patient not taking: Reported on 07/13/2021    [provider]    Social History Social History   Tobacco Use   Smoking status: Every Day    Packs/day: 1.50    Years: 25.00    Pack years: 37.50    Types: Cigarettes   Smokeless tobacco: Current    Types: Chew   Tobacco comments:    05/17/21 Ronalee Belts has cut down smoking to 1/2 to 1 ppd.  Not ready to quit.  Vaping Use   Vaping Use: Never used  Substance Use Topics   Alcohol use: Yes    Alcohol/week: 24.0 standard drinks    Types: 24 Cans of beer per week    Comment: 6 cans every other day  Drug use: Yes    Frequency: 2.0 times per week    Types: Marijuana    Comment: he took Marijuana 2 weeks ago    Review of Systems: Documented in HPI ____________________________________________  PHYSICAL EXAM: VITAL SIGNS: ED Triage Vitals  Enc Vitals Group     BP 09/01/21 0122 (!) 148/136     Pulse Rate 09/01/21 0122 (!) 156     Resp 09/01/21 0122 (!) 25     Temp 09/01/21 0122 98.7 F (37.1 C)     Temp Source 09/01/21 0122 Oral     SpO2 09/01/21 0122 98 %     Weight 09/01/21 0119 198 lb (89.8 kg)     Height 09/01/21 0119 6\' 2"  (1.88 m)    Physical Exam Vitals and nursing note reviewed.  Constitutional:      Appearance:  He is well-developed.  HENT:     Head: Normocephalic and atraumatic.     Mouth/Throat:     Mouth: Mucous membranes are moist.     Pharynx: Oropharynx is clear.  Eyes:     Pupils: Pupils are equal, round, and reactive to light.  Cardiovascular:     Rate and Rhythm: Tachycardia present. Rhythm irregular.  Pulmonary:     Effort: Pulmonary effort is normal. No respiratory distress.  Abdominal:     General: Abdomen is flat. There is no distension.  Musculoskeletal:        General: No swelling or tenderness. Normal range of motion.     Cervical back: Normal range of motion.  Skin:    General: Skin is warm and dry.     Coloration: Skin is not jaundiced or pale.  Neurological:     General: No focal deficit present.     Mental Status: He is alert.      ____________________________________________   LABS (all labs ordered are listed, but only abnormal results are displayed)  Labs Reviewed  CBC WITH DIFFERENTIAL/PLATELET - Abnormal; Notable for the following components:      Result Value   Hemoglobin 17.7 (*)    All other components within normal limits  COMPREHENSIVE METABOLIC PANEL - Abnormal; Notable for the following components:   Glucose, Bld 135 (*)    All other components within normal limits  MAGNESIUM   ____________________________________________  EKG   EKG Interpretation  Date/Time:  Saturday September 01 2021 01:19:41 EST Ventricular Rate:  155 PR Interval:  105 QRS Duration: 157 QT Interval:  303 QTC Calculation: 487 R Axis:   53 Text Interpretation: Ectopic atrial tachycardia, unifocal Right bundle branch block Anteroseptal infarct, age indeterminate Confirmed by Merrily Pew [phone]) on 09/01/2021 1:21:50 AM        EKG Interpretation  Date/Time:  Saturday September 01 2021 01:19:41 EST Ventricular Rate:  155 PR Interval:  105 QRS Duration: 157 QT Interval:  303 QTC Calculation: 487 R Axis:   53 Text Interpretation: Ectopic atrial tachycardia,  unifocal Right bundle branch block Anteroseptal infarct, age indeterminate Confirmed by Merrily Pew [phone]) on 09/01/2021 1:21:50 AM         ____________________________________________  RADIOLOGY  DG Chest 2 View  Result Date: 09/01/2021 CLINICAL DATA:  EVA EXAM: CHEST - 2 VIEW COMPARISON:  10/01/2014 FINDINGS: The heart size and mediastinal contours are within normal limits. Both lungs are clear. The visualized skeletal structures are unremarkable. IMPRESSION: No active cardiopulmonary disease. Electronically Signed   By: Ulyses Jarred M.D.   On: 09/01/2021 02:15   ____________________________________________  PROCEDURES  Procedure(s) performed:   .  Sedation  Date/Time: 09/01/2021 3:48 AM Performed by: Merrily Pew, MD Authorized by: Merrily Pew, MD   Consent:    Consent obtained:  Verbal and written   Consent given by:  Patient   Risks discussed:  Allergic reaction, dysrhythmia, inadequate sedation, vomiting, respiratory compromise necessitating ventilatory assistance and intubation, nausea, prolonged sedation necessitating reversal and prolonged hypoxia resulting in organ damage   Alternatives discussed:  Analgesia without sedation Universal protocol:    Procedure explained and questions answered to patient or proxy's satisfaction: yes     Immediately prior to procedure, a time out was called: yes   Indications:    Procedure performed:  Cardioversion   Procedure necessitating sedation performed by:  Physician performing sedation Pre-sedation assessment:    Time since last food or drink:  4 hours   ASA classification: class 2 - patient with mild systemic disease     Mouth opening:  3 or more finger widths   Mallampati score:  II - soft palate, uvula, fauces visible   Neck mobility: normal     Pre-sedation assessments completed and reviewed: airway patency     Pre-sedation assessment completed:  09/01/2021 3:49 AM Immediate pre-procedure details:    Reassessment:  Patient reassessed immediately prior to procedure     Reviewed: vital signs, relevant labs/tests and NPO status     Verified: bag valve mask available   Procedure details (see MAR for exact dosages):    Preoxygenation:  Room air and nasal cannula   Sedation:  Propofol   Intended level of sedation: deep   Analgesia:  Fentanyl   Intra-procedure monitoring:  Blood pressure monitoring, cardiac monitor, continuous capnometry, continuous pulse oximetry, frequent vital sign checks and frequent LOC assessments   Intra-procedure events: none     Total Provider sedation time (minutes):  12 Post-procedure details:    Post-sedation assessment completed:  09/01/2021 4:03 AM   Attendance: Constant attendance by certified staff until patient recovered     Recovery: Patient returned to pre-procedure baseline     Post-sedation assessments completed and reviewed: airway patency, cardiovascular function, hydration status, mental status, nausea/vomiting, pain level, respiratory function and temperature     Patient is stable for discharge or admission: yes     Procedure completion:  Tolerated well, no immediate complications .Critical Care Performed by: Merrily Pew, MD Authorized by: Merrily Pew, MD   Critical care provider statement:    Critical care time (minutes):  32   Critical care time was exclusive of:  Separately billable procedures and treating other patients and teaching time   Critical care was necessary to treat or prevent imminent or life-threatening deterioration of the following conditions:  Cardiac failure   Critical care was time spent personally by me on the following activities:  Development of treatment plan with patient or surrogate, evaluation of patient's response to treatment, examination of patient, obtaining history from patient or surrogate, review of old charts, re-evaluation of patient's condition, pulse oximetry, ordering and performing treatments and interventions and ordering  and review of laboratory studies .Cardioversion  Date/Time: 09/01/2021 4:03 AM Performed by: Merrily Pew, MD Authorized by: Merrily Pew, MD   Consent:    Consent obtained:  Verbal and written   Consent given by:  Patient   Risks discussed:  Cutaneous burn, death, induced arrhythmia and pain   Alternatives discussed:  No treatment Pre-procedure details:    Cardioversion basis:  Emergent   Rhythm:  Atrial fibrillation   Electrode placement:  Anterior-posterior Patient sedated: Yes. Refer  to sedation procedure documentation for details of sedation.  Attempt one:    Cardioversion mode:  Synchronous   Waveform:  Biphasic   Shock (Joules):  120   Shock outcome:  Conversion to normal sinus rhythm Post-procedure details:    Patient status:  Awake   Patient tolerance of procedure:  Tolerated well, no immediate complications ____________________________________________  INITIAL IMPRESSION / ASSESSMENT AND PLAN   This patient presents to the ED for concern of palpitations, this involves an extensive number of treatment options, and is a complaint that carries with it a high risk of complications and morbidity.  The differential diagnosis includes atrial fibrillation with rapid ventricular response, atrial flutter with rapid ventricular response, sinus tachycardia, ventricular fibrillation, ventricular tachycardia related to electrolyte abnormalities. Appears to be A. fib RVR at this time.  We will attempt diltiazem.  Will check electrolytes  Additional history obtained:  Additional history obtained from no one Previous records obtained and reviewed in epic  Co morbidities that complicate the patient evaluation  Smoker  Social Determinants of Health:  N/A  ED Course  Images ordered viewed and obtained by myself. Agree with Radiology interpretation. Details in ED course.  Labs ordered reviewed by myself as detailed in ED course.  Consultations obtained/considered detailed in ED  course.   Clinical Course as of 09/01/21 0458  Sat Sep 01, 2021  0147 Pulse Rate(!): 156 [JM]  0147 BP(!): 148/136 Stable blood pressure does not require emergent cardioversion at this time although I did consider it. [JM]  0148 EKG 12-Lead A flutter with a 2-1 block versus pretty consistent A. fib RVR.  Discussed cardioversion versus medication with the patient.  He states he had cardioversion the past but would not be opposed to that.  He prefers to try the diltiazem first while we wait for his labs.  If he becomes unstable I will cardiovert him [JM]  0226 Magnesium: 2.2 Reassuring [JM]  0227 Hemoglobin(!): 17.7 Likely hemoconcentrated [JM]  0227 Calcium: 9.0 Reassuring [JM]  0306 Diltiazem not working. Lytes reassuring. Risks/benefits for sedation/cardioversion explained to patient and verbal consent obtained. Will use propofol/fentanyl combo.  [JM]  0359 Cardioversion to sinus rhythm. See procedure notes. [JM]  1194 Reevaluated multiple times since awakening from sedation. Continuously in NSR. Feels well. Will d/c with cardiology follow up (will send message to Dr. Gardiner Rhyme).  [JM]    Clinical Course User Index [JM] Taija Mathias, Corene Cornea, MD      Cardiac Monitoring:  The patient was maintained on a cardiac monitor.  I personally viewed and interpreted the cardiac monitored which showed an underlying rhythm of: Initially atrial fibrillation with rapid ventricular response and then conversion to normal sinus rhythm.  CRITICAL INTERVENTIONS:  Sedation Cardioversion  Reevaluation:  After the interventions noted above, I reevaluated the patient and found that they have :improved  FINAL IMPRESSION AND PLAN Final diagnoses:  Atrial fibrillation with rapid ventricular response ([place])   A medical screening exam was performed and I feel the patient has had an appropriate workup for their chief complaint at this time and likelihood of emergent condition existing is low. They have been  counseled on decision, DISCHARGE, follow up and which symptoms necessitate immediate return to the emergency department. They or their family verbally stated understanding and agreement with plan and discharged in stable condition.   ____________________________________________   NEW OUTPATIENT MEDICATIONS STARTED DURING THIS VISIT:  New Prescriptions   No medications on file    Note:  This note was prepared with assistance  of Systems analyst. Occasional wrong-word or sound-a-like substitutions may have occurred due to the inherent limitations of voice recognition software.    Shermika Balthaser, Corene Cornea, MD 09/01/21 0500

## 2021-09-01 NOTE — ED Notes (Signed)
Patient transported to X-ray 

## 2021-09-03 ENCOUNTER — Telehealth: Payer: Self-pay | Admitting: General Practice

## 2021-09-03 ENCOUNTER — Telehealth: Payer: Self-pay | Admitting: Cardiology

## 2021-09-03 NOTE — Telephone Encounter (Signed)
Patient called to schedule a hospital follow up as soon as possible. He was scheduled for the first available I saw at the time, 09/14/21. Patient would also like his ED visit reviewed to see if he needs to be worked in sooner and given any advise prior to his appointment.

## 2021-09-03 NOTE — Telephone Encounter (Signed)
Spoke with patient who stated he went to ED on Saturday for a rapid and irregular heart beat. He was cardioverted in the ED. He wanted an appointment with Dr. Gardiner Rhyme, but had been scheduled with Arnold Long, FNP on Feb.10. I checked Dr. Newman Nickels calendar and recommended the patient keep his appointment with Arnold Long. Also informed patient that if he feels his heart racing again, to go to the ED. Patient voiced understating of the conversation.

## 2021-09-03 NOTE — Telephone Encounter (Addendum)
Transition Care Management Follow-up Telephone Call Date of discharge and from where: 09/01/21 from [facility] How have you been since you were released from the hospital? Feeling a little better. Any questions or concerns? No  Items Reviewed: Did the pt receive and understand the discharge instructions provided? Yes  Medications obtained and verified? No  Other? No  Any new allergies since your discharge? No  Dietary orders reviewed? Yes Do you have support at home? Yes   Home Care and Equipment/Supplies: Were home health services ordered? no  Functional Questionnaire: (I = Independent and D = Dependent) ADLs: I  Bathing/Dressing- I  Meal Prep- I  Eating- I  Maintaining continence- I  Transferring/Ambulation- I  Managing Meds- I  Follow up appointments reviewed:  PCP Hospital f/u appt confirmed? No   Specialist Hospital f/u appt confirmed? Yes  Scheduled to see K. Lawrence on 09/14/2021. Are transportation arrangements needed? No  If their condition worsens, is the pt aware to call PCP or go to the Emergency Dept.? Yes Was the patient provided with contact information for the PCP's office or ED? Yes Was to pt encouraged to call back with questions or concerns? Yes

## 2021-09-04 ENCOUNTER — Other Ambulatory Visit (HOSPITAL_COMMUNITY): Payer: Self-pay | Admitting: Internal Medicine

## 2021-09-04 NOTE — Telephone Encounter (Signed)
This is Dr. Tyrell Antonio pt

## 2021-09-06 ENCOUNTER — Ambulatory Visit: Payer: 59 | Admitting: Family Medicine

## 2021-09-13 NOTE — Progress Notes (Deleted)
Cardiology Clinic Note   Patient Name: Christopher Gutierrez Date of Encounter: 09/13/2021  Primary Care Provider:  [name], DO Primary Cardiologist:  Christopher Heinz, MD Vascular: Christopher Anon MD  Patient Profile    61 year old male with history of atrial fibrillation/flutter status post successful TEE guided cardioversion (11/01/2020), remains on Eliquis 5 mg twice daily, along with Plavix, hypertension, tobacco abuse, hypertension, chronic systolic heart failure with severely reduced LV systolic function (EF 11% to 25%) improved with medical therapy with an EF of 40 to 45%,, peripheral arterial disease.  Followed by Dr. Fletcher Gutierrez for severe left calf claudication with rest pain and ischemic changes in the toes.  Angiography in August 2022 did not reveal any significant aortoiliac disease on the left, the distal SFA was occluded into the proximal popliteal artery with two-vessel runoff below the knee.  On the right side there was severe focal stenosis in the distal SFA.  Dr. Fletcher Gutierrez performed a successful angioplasty with 2 overlapping Bio-Medicus self-expanding stents to the left SFA into the proximal popliteal artery on 03/07/2021.  Was last seen by Christopher Gutierrez on 06/12/2021, at which time he reported that ABIs had improved to normal on the left and only mildly reduced on the right side at 0.81.  Duplex showed normal velocities in the left SFA/popliteal artery stents.  He is due to have repeat arterial Doppler studies in February 2023.   Past Medical History    Past Medical History:  Diagnosis Date   Atrial fibrillation ([place]) 10/24/2020   Atrial fibrillation and flutter ([place])    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   Hypertension    Past Surgical History:  Procedure Laterality Date   ABDOMINAL AORTOGRAM W/LOWER EXTREMITY N/A 03/07/2021   Procedure: ABDOMINAL AORTOGRAM W/LOWER EXTREMITY;  Surgeon: Wellington Hampshire, MD;  Location: [place] CV LAB;  Service:  Cardiovascular;  Laterality: N/A;   CARDIOVERSION N/A 11/01/2020   Procedure: CARDIOVERSION;  Surgeon: Jerline Pain, MD;  Location: [facility] ENDOSCOPY;  Service: Cardiovascular;  Laterality: N/A;   CATARACT EXTRACTION Right    MOUTH SURGERY  2013   PERIPHERAL VASCULAR INTERVENTION Left 03/07/2021   Procedure: PERIPHERAL VASCULAR INTERVENTION;  Surgeon: Wellington Hampshire, MD;  Location: [place] CV LAB;  Service: Cardiovascular;  Laterality: Left;  left SFA   TEE WITHOUT CARDIOVERSION N/A 11/01/2020   Procedure: TRANSESOPHAGEAL ECHOCARDIOGRAM (TEE);  Surgeon: Jerline Pain, MD;  Location: [facility] ENDOSCOPY;  Service: Cardiovascular;  Laterality: N/A;    Allergies  Allergies  Allergen Reactions   Beta Adrenergic Blockers Other (See Comments)    Scrotal edema with metoprolol and carvedilol   Tape Rash    Plastic tape    History of Present Illness    Christopher Gutierrez is a 61 year old patient we are seeing for ongoing assessment and management of multiple medical issues to include persistent atrial fibrillation on Eliquis (CHADS VASC Score 3), peripheral arterial disease status post stent to the left FSA and popliteal artery, chronic systolic heart failure with reduced EF of 45 to 50% on most recent echo, hyperlipidemia, and ongoing tobacco abuse.  Home Medications    Current Outpatient Medications  Medication Sig Dispense Refill   acetaminophen (TYLENOL) 325 MG tablet Take 650 mg by mouth every 6 (six) hours as needed for moderate pain or headache.     apixaban (ELIQUIS) 5 MG TABS tablet TAKE 1 TABLET BY MOUTH TWICE A DAY (Patient taking differently: Take 5 mg by mouth 2 (two) times  daily.) 180 tablet 1   bisoprolol (ZEBETA) 5 MG tablet Take 0.5 tablets (2.5 mg total) by mouth daily. 45 tablet 3   cetirizine (ZYRTEC) 10 MG tablet Take 10 mg by mouth as needed for allergies.     clopidogrel (PLAVIX) 75 MG tablet Take 1 tablet (75 mg total) by mouth daily. 30 tablet 6   dapagliflozin propanediol  (FARXIGA) 10 MG TABS tablet Take 1 tablet (10 mg total) by mouth daily before breakfast. 30 tablet 4   digoxin (LANOXIN) 0.125 MG tablet Take 125 mcg by mouth daily. (Patient not taking: Reported on 09/01/2021)     ENTRESTO 97-103 MG TAKE 1 TABLET BY MOUTH TWICE A DAY 60 tablet 6   Famotidine-Ca Carb-Mag Hydrox (PEPCID COMPLETE PO) Take 1 tablet by mouth daily as needed (acid reflux).     Nicotine 10 MG/ML SOLN 1-2 puffs as needed (Patient not taking: Reported on 07/13/2021) 10 mL 2   nicotine polacrilex (COMMIT) 4 MG lozenge Take 4 mg by mouth as needed for smoking cessation. (Patient not taking: Reported on 07/13/2021)     rosuvastatin (CRESTOR) 20 MG tablet TAKE 1 TABLET BY MOUTH EVERY DAY 90 tablet 2   spironolactone (ALDACTONE) 25 MG tablet Take 0.5 tablets (12.5 mg total) by mouth daily. 45 tablet 3   traMADol (ULTRAM) 50 MG tablet Take 50 mg by mouth every 6 (six) hours as needed for moderate pain.     No current facility-administered medications for this visit.     Family History    Family History  Problem Relation Age of Onset   Gout Brother    Hypertension Brother    COPD Maternal Uncle    Stroke Paternal Aunt    Birth defects Paternal Aunt    He indicated that his mother is deceased. He indicated that his father is alive. He indicated that his brother is alive. He indicated that his maternal grandmother is deceased. He indicated that his maternal grandfather is deceased. He indicated that his paternal grandmother is deceased. He indicated that his paternal grandfather is deceased. He indicated that his son is alive. He indicated that his maternal aunt is alive. He indicated that only one of his two maternal uncles is alive. He indicated that two of his three paternal aunts are alive.  Social History    Social History   Socioeconomic History   Marital status: Divorced    Spouse name: Not on file   Number of children: 1   Years of education: Not on file   Highest education  level: Master's degree (e.g., MA, MS, MEng, MEd, MSW, MBA)  Occupational History   Not on file  Tobacco Use   Smoking status: Every Day    Packs/day: 1.50    Years: 25.00    Pack years: 37.50    Types: Cigarettes   Smokeless tobacco: Current    Types: Chew   Tobacco comments:    05/17/21 Ronalee Belts has cut down smoking to 1/2 to 1 ppd.  Not ready to quit.  Vaping Use   Vaping Use: Never used  Substance and Sexual Activity   Alcohol use: Yes    Alcohol/week: 24.0 standard drinks    Types: 24 Cans of beer per week    Comment: 6 cans every other day   Drug use: Yes    Frequency: 2.0 times per week    Types: Marijuana    Comment: he took Marijuana 2 weeks ago   Sexual activity: Yes    Partners:  Female  Other Topics Concern   Not on file  Social History Narrative   Not on file   Social Determinants of Health   Financial Resource Strain: Low Risk    Difficulty of Paying Living Expenses: Not hard at all  Food Insecurity: No Food Insecurity   Worried About [place] in the Last Year: Never true   [place] in the Last Year: Never true  Transportation Needs: No Transportation Needs   Lack of Transportation (Medical): No   Lack of Transportation (Non-Medical): No  Physical Activity: Not on file  Stress: Not on file  Social Connections: Not on file  Intimate Partner Violence: Not on file     Review of Systems    General:  No chills, fever, night sweats or weight changes.  Cardiovascular:  No chest pain, dyspnea on exertion, edema, orthopnea, palpitations, paroxysmal nocturnal dyspnea. Dermatological: No rash, lesions/masses Respiratory: No cough, dyspnea Urologic: No hematuria, dysuria Abdominal:   No nausea, vomiting, diarrhea, bright red blood per rectum, melena, or hematemesis Neurologic:  No visual changes, wkns, changes in mental status. All other systems reviewed and are otherwise negative except as noted above.     Physical Exam    VS:  There were  no vitals taken for this visit. , BMI There is no height or weight on file to calculate BMI.     GEN: Well nourished, well developed, in no acute distress. HEENT: normal. Neck: Supple, no JVD, carotid bruits, or masses. Cardiac: RRR, no murmurs, rubs, or gallops. No clubbing, cyanosis, edema.  Radials/DP/PT 2+ and equal bilaterally.  Respiratory:  Respirations regular and unlabored, clear to auscultation bilaterally. GI: Soft, nontender, nondistended, BS + x 4. MS: no deformity or atrophy. Skin: warm and dry, no rash. Neuro:  Strength and sensation are intact. Psych: Normal affect.  Accessory Clinical Findings    ECG personally reviewed by me today- *** - No acute changes  Lab Results  Component Value Date   WBC 9.9 09/01/2021   HGB 17.7 (H) 09/01/2021   HCT 51.3 09/01/2021   MCV 98.5 09/01/2021   PLT 172 09/01/2021   Lab Results  Component Value Date   CREATININE 1.02 09/01/2021   BUN 12 09/01/2021   NA 139 09/01/2021   K 4.2 09/01/2021   CL 107 09/01/2021   CO2 23 09/01/2021   Lab Results  Component Value Date   ALT 32 09/01/2021   AST 29 09/01/2021   ALKPHOS 81 09/01/2021   BILITOT 1.1 09/01/2021   Lab Results  Component Value Date   CHOL 229 (H) 01/26/2021   HDL 69 01/26/2021   LDLCALC 139 (H) 01/26/2021   TRIG 105 01/26/2021   CHOLHDL 3.3 01/26/2021    Lab Results  Component Value Date   HGBA1C 5.3 10/12/2020    Review of Prior Studies: ABI 03/19/2021 Summary:  Left: Marked improvement is noted compared to previous study.   Heterogenous plaque throughout.  Widely patent mid SFA to above-the-knee popliteal artery stent without  evidence of stenosis.   Echocardiogram 01/26/2021 1. Compared with echo 01/2830, systolic function has improved.   2. Left ventricular ejection fraction, by estimation, is 45 to 50%. The  left ventricle has mildly decreased function. The left ventricle  demonstrates global hypokinesis. There is moderate concentric left   ventricular hypertrophy. Left ventricular  diastolic parameters are consistent with Grade I diastolic dysfunction  (impaired relaxation).   3. Right ventricular systolic function is normal. The right ventricular  size is normal.   4. The mitral valve is normal in structure. Trivial mitral valve  regurgitation. No evidence of mitral stenosis.   5. The aortic valve is tricuspid. Aortic valve regurgitation is not  visualized. No aortic stenosis is present.   6. The inferior vena cava is normal in size with greater than 50%  respiratory variability, suggesting right atrial pressure of 3 mmHg.   Assessment & Plan   1.  ***    Current medicines are reviewed at length with the patient today.  I have spent *** min's  dedicated to the care of this patient on the date of this encounter to include pre-visit review of records, assessment, management and diagnostic testing,with shared decision making. Signed, Phill Myron. West Pugh, ANP, AACC   09/13/2021 6:28 PM    [facility] [place] [address] Office [phone] Fax [phone]  Notice: This dictation was prepared with Dragon dictation along with smaller phrase technology. Any transcriptional errors that result from this process are unintentional and may not be corrected upon review.

## 2021-09-14 ENCOUNTER — Ambulatory Visit: Payer: 59 | Admitting: Adult Health

## 2021-09-25 ENCOUNTER — Other Ambulatory Visit (HOSPITAL_COMMUNITY): Payer: Self-pay | Admitting: Family Medicine

## 2021-10-08 ENCOUNTER — Other Ambulatory Visit (HOSPITAL_COMMUNITY): Payer: Self-pay | Admitting: Family Medicine

## 2021-10-10 NOTE — Progress Notes (Signed)
Office Visit    Patient Name: Christopher Gutierrez Date of Encounter: 10/11/2021  Primary Care Provider:  [name], DO Primary Cardiologist:  [name], MD  Chief Complaint    61 year old male with a history of persistent atrial fibrillation, chronic systolic heart failure, hypertension, hyperlipidemia, PAD, lumbar spondylolysis, peripheral neuropathy, tobacco use, and alcohol use who presents for follow-up related to atrial fibrillation and heart failure.   Past Medical History    Past Medical History:  Diagnosis Date   Atrial fibrillation ([place]) 10/24/2020   Atrial fibrillation and flutter ([place])    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   Hypertension    Past Surgical History:  Procedure Laterality Date   ABDOMINAL AORTOGRAM W/LOWER EXTREMITY N/A 03/07/2021   Procedure: ABDOMINAL AORTOGRAM W/LOWER EXTREMITY;  Surgeon: Wellington Hampshire, MD;  Location: [place] CV LAB;  Service: Cardiovascular;  Laterality: N/A;   CARDIOVERSION N/A 11/01/2020   Procedure: CARDIOVERSION;  Surgeon: Jerline Pain, MD;  Location: [facility] ENDOSCOPY;  Service: Cardiovascular;  Laterality: N/A;   CATARACT EXTRACTION Right    MOUTH SURGERY  2013   PERIPHERAL VASCULAR INTERVENTION Left 03/07/2021   Procedure: PERIPHERAL VASCULAR INTERVENTION;  Surgeon: Wellington Hampshire, MD;  Location: [place] CV LAB;  Service: Cardiovascular;  Laterality: Left;  left SFA   TEE WITHOUT CARDIOVERSION N/A 11/01/2020   Procedure: TRANSESOPHAGEAL ECHOCARDIOGRAM (TEE);  Surgeon: Jerline Pain, MD;  Location: [facility] ENDOSCOPY;  Service: Cardiovascular;  Laterality: N/A;    Allergies  Allergies  Allergen Reactions   Beta Adrenergic Blockers Other (See Comments)    Scrotal edema with metoprolol and carvedilol   Tape Rash    Plastic tape    History of Present Illness    61 year old male with the above past medical history including persistent atrial fibrillation, chronic  systolic heart failure, hypertension, hyperlipidemia, PAD, lumbar spondylolysis, peripheral neuropathy, tobacco use, and alcohol use.  He was diagnosed with systolic heart failure in March 2022 in the setting of atrial fibrillation with rapid ventricular response.  Echocardiogram at the time showed an EF of 20 to 25%.  He underwent successful TEE guided cardioversion in March 2022, on Eliquis.  He has followed with Dr. [name] for chronic systolic heart failure.  Most recent echocardiogram in June 2022 showed EF 45 to 50%, mildly decreased LV function, global LV hypokinesis, moderate concentric LVH, GIDD.   Additionally, he follows with Dr. Fletcher Anon for PAD.  He underwent endovascular intervention on the left SFA and popliteal artery with complete resolution of claudication symptoms and normalization of ABI. He does have residual right SFA disease, but denies claudication. He is scheduled for repeat ABIs/LE duplex later this month.   He presented to the ED on 09/01/2021 with complaints of palpitations and was noted to be in A-fib with RVR.  He underwent DCCV with restoration of NSR.  Magnesium, electrolytes were normal at that time.  He presents today for follow-up. Since his ED visit he has been stable from a cardiac standpoint.  He denies any dyspnea, palpitations, dizziness, presyncope or syncope, denies symptoms concerning for angina.  He does report overall generalized fatigue, however, this is unchanged. He has chronic stable dyspnea on exertion, this is also unchanged.  He continues to smoke.  BP is elevated in office today, he reports he has not been checking his BP at home. He is frustrated as he states he has seen several cardiologists and is not sure who his  primary cardiology team is.  Otherwise, he reports feeling okay and denies any specific concerns or complaints today.  Home Medications    Current Outpatient Medications  Medication Sig Dispense Refill   acetaminophen (TYLENOL) 325 MG tablet  Take 650 mg by mouth every 6 (six) hours as needed for moderate pain or headache.     apixaban (ELIQUIS) 5 MG TABS tablet TAKE 1 TABLET BY MOUTH TWICE A DAY (Patient taking differently: Take 5 mg by mouth 2 (two) times daily.) 180 tablet 1   bisoprolol (ZEBETA) 5 MG tablet Take 0.5 tablets (2.5 mg total) by mouth daily. 45 tablet 3   cetirizine (ZYRTEC) 10 MG tablet Take 10 mg by mouth as needed for allergies.     ENTRESTO 97-103 MG TAKE 1 TABLET BY MOUTH TWICE A DAY 60 tablet 6   Famotidine-Ca Carb-Mag Hydrox (PEPCID COMPLETE PO) Take 1 tablet by mouth daily as needed (acid reflux).     Nicotine 10 MG/ML SOLN 1-2 puffs as needed 10 mL 2   spironolactone (ALDACTONE) 25 MG tablet Take 1 tablet (25 mg total) by mouth daily. 90 tablet 3   dapagliflozin propanediol (FARXIGA) 10 MG TABS tablet Take 1 tablet (10 mg total) by mouth daily before breakfast. NEEDS FOLLOW UP APPOINTMENT FOR ANYMORE REFILLS 30 tablet 3   digoxin (LANOXIN) 0.125 MG tablet Take 125 mcg by mouth daily. (Patient not taking: Reported on 09/01/2021)     nicotine polacrilex (COMMIT) 4 MG lozenge Take 4 mg by mouth as needed for smoking cessation. (Patient not taking: Reported on 07/13/2021)     rosuvastatin (CRESTOR) 20 MG tablet TAKE 1 TABLET BY MOUTH EVERY DAY (Patient not taking: Reported on 10/11/2021) 90 tablet 2   traMADol (ULTRAM) 50 MG tablet Take 50 mg by mouth every 6 (six) hours as needed for moderate pain. (Patient not taking: Reported on 10/11/2021)     No current facility-administered medications for this visit.     Review of Systems    He denies chest pain, palpitations, dyspnea, pnd, orthopnea, n, v, dizziness, syncope, edema, weight gain, or early satiety. All other systems reviewed and are otherwise negative except as noted above.   Physical Exam    VS:  BP (!) 160/100 (BP Location: Left Arm, Patient Position: Sitting, Cuff Size: Normal)    Pulse 61    Ht '6\' 2"'$  (1.88 m)    Wt 203 lb 9.6 oz (92.4 kg)    SpO2 98%     BMI 26.14 kg/m   GEN: Well nourished, well developed, in no acute distress. HEENT: normal. Neck: Supple, no JVD, carotid bruits, or masses. Cardiac: RRR, no murmurs, rubs, or gallops. No clubbing, cyanosis, edema.  Radials/DP/PT 2+ and equal bilaterally.  Respiratory:  Respirations regular and unlabored, clear to auscultation bilaterally. GI: Soft, nontender, nondistended, BS + x 4. MS: no deformity or atrophy. Skin: warm and dry, no rash. Neuro:  Strength and sensation are intact. Psych: Normal affect.  Accessory Clinical Findings    ECG personally reviewed by me today - NSR, 61 bpm - no acute changes.  Lab Results  Component Value Date   WBC 9.9 09/01/2021   HGB 17.7 (H) 09/01/2021   HCT 51.3 09/01/2021   MCV 98.5 09/01/2021   PLT 172 09/01/2021   Lab Results  Component Value Date   CREATININE 1.02 09/01/2021   BUN 12 09/01/2021   NA 139 09/01/2021   K 4.2 09/01/2021   CL 107 09/01/2021   CO2 23 09/01/2021  Lab Results  Component Value Date   ALT 32 09/01/2021   AST 29 09/01/2021   ALKPHOS 81 09/01/2021   BILITOT 1.1 09/01/2021   Lab Results  Component Value Date   CHOL 229 (H) 01/26/2021   HDL 69 01/26/2021   LDLCALC 139 (H) 01/26/2021   TRIG 105 01/26/2021   CHOLHDL 3.3 01/26/2021    Lab Results  Component Value Date   HGBA1C 5.3 10/12/2020    Assessment & Plan    1. Persistent atrial fibrillation: S/p TEE guided cardioversion in March 2022, on Eliquis. S/p repeat DCCV on 09/01/2021 during ED visit with restoration of NSR. EKG today shows normal sinus rhythm, 61 bpm. He denies palpitations, dizziness, presyncope, syncope. CHA2DS2VASc = 2. Continue Eliquis. CMET, Mg were normal on 09/01/2021. Will check TSH today.  Continue Eliquis, bisoprolol.  2. Chronic systolic heart failure: Most recent echo in June 2022 showed EF 45 to 50%, mildly decreased LV function, global LV hypokinesis, moderate concentric LVH, GIDD.  He does have stable chronic mild dyspnea on  exertion likely in the setting of longstanding tobacco use and physical deconditioning.  This is unchanged from prior visits.  Euvolemic and well compensated on exam. No indication for repeat echo at this time.  He states he stopped taking his Wilder Glade as his prescription ran out.  We will restart Farxiga 10 mg daily.  Repeat BMET as below. Continue bisoprolol, Entresto, spironolactone as below.  3. Hypertension: BP above goal.  He is not checking home BP.  Advised him to monitor home BP and report readings consistently > 130/80. Will increase spironolactone to 25 mg daily.  Resume [place] as above.  Recheck BMET in 2 weeks.  Otherwise, continue current antihypertensive regimen.   4. Hyperlipidemia: LDL was 139 in 01/2021.  He is not fasting today. Recommend repeat lipid panel at follow-up visit.  Continue Crestor.  5. PAD: He underwent endovascular intervention on the left SFA and popliteal artery with complete resolution of claudication symptoms and normalization of ABI. He does have residual right SFA disease, but denies claudication. He is scheduled for repeat ABIs/LE duplex later this month.  Recommend follow-up with Dr. Fletcher Anon following repeat studies.  6. Tobacco use/alcohol use: He continues to smoke and drink alcohol in moderation.  Full cessation advised.  7. Disposition: Follow-up with Dr. Fletcher Anon for PAD, follow-up in 6 to 8 weeks with Dr. Gardiner Rhyme or APP.   Lenna Sciara, NP 10/11/2021, 12:28 PM

## 2021-10-11 ENCOUNTER — Encounter: Payer: Self-pay | Admitting: Nurse Practitioner

## 2021-10-11 ENCOUNTER — Other Ambulatory Visit: Payer: Self-pay

## 2021-10-11 ENCOUNTER — Ambulatory Visit (INDEPENDENT_AMBULATORY_CARE_PROVIDER_SITE_OTHER): Payer: 59 | Admitting: Nurse Practitioner

## 2021-10-11 VITALS — BP 160/100 | HR 61 | Ht 74.0 in | Wt 203.6 lb

## 2021-10-11 DIAGNOSIS — I5022 Chronic systolic (congestive) heart failure: Secondary | ICD-10-CM | POA: Diagnosis not present

## 2021-10-11 DIAGNOSIS — E785 Hyperlipidemia, unspecified: Secondary | ICD-10-CM

## 2021-10-11 DIAGNOSIS — I10 Essential (primary) hypertension: Secondary | ICD-10-CM

## 2021-10-11 DIAGNOSIS — Z720 Tobacco use: Secondary | ICD-10-CM

## 2021-10-11 DIAGNOSIS — Z789 Other specified health status: Secondary | ICD-10-CM

## 2021-10-11 DIAGNOSIS — I739 Peripheral vascular disease, unspecified: Secondary | ICD-10-CM

## 2021-10-11 DIAGNOSIS — I4819 Other persistent atrial fibrillation: Secondary | ICD-10-CM | POA: Diagnosis not present

## 2021-10-11 MED ORDER — SPIRONOLACTONE 25 MG PO TABS: 25.0000 mg | ORAL_TABLET | Freq: Every day | ORAL | 3 refills | Status: DC

## 2021-10-11 MED ORDER — DAPAGLIFLOZIN PROPANEDIOL 10 MG PO TABS: 10.0000 mg | ORAL_TABLET | Freq: Every day | ORAL | 3 refills | Status: DC

## 2021-10-11 NOTE — Patient Instructions (Signed)
Medication Instructions:  ?Restart Farxiga 10 mg daily ?Start Spironolactone 25 mg daily  ? ?*If you need a refill on your cardiac medications before your next appointment, please call your pharmacy* ? ? ?Lab Work: ?Your physician recommends that complete labs today and return in 2 weeks to check BMET.  ?TSH (today) ?BMET(2 weeks) ? ?If you have labs (blood work) drawn today and your tests are completely normal, you will receive your results only by: ?MyChart Message (if you have MyChart) OR ?A paper copy in the mail ?If you have any lab test that is abnormal or we need to change your treatment, we will call you to review the results. ? ? ?Testing/Procedures: ?NONE ordered at this time of appointment  ? ? ?Follow-Up: ?At [facility], you and your health needs are our priority.  As part of our continuing mission to provide you with exceptional heart care, we have created designated Provider Care Teams.  These Care Teams include your primary Cardiologist (physician) and Advanced Practice Providers (APPs -  Physician Assistants and Nurse Practitioners) who all work together to provide you with the care you need, when you need it. ? ?We recommend signing up for the patient portal called "MyChart".  Sign up information is provided on this After Visit Summary.  MyChart is used to connect with patients for Virtual Visits (Telemedicine).  Patients are able to view lab/test results, encounter notes, upcoming appointments, etc.  Non-urgent messages can be sent to your provider as well.   ?To learn more about what you can do with MyChart, go to [email or web address].   ? ?Your next appointment:   ?1-3 month(s) ?1-2 months  ?The format for your next appointment:   ?In Person ? ?Provider:   ?[name], MD,  Fletcher Anon MD  ? ? ?Other Instructions ?Please keep a heck on your blood pressure. Log given today.  ? ?

## 2021-10-12 ENCOUNTER — Telehealth: Payer: Self-pay

## 2021-10-12 LAB — TSH: TSH: 2.67 u[IU]/mL (ref 0.450–4.500)

## 2021-10-12 NOTE — Telephone Encounter (Addendum)
Spoke with pt. Pt was notified of results and will f/u as directed. ----- Message from Lenna Sciara, NP sent at 10/12/2021  6:03 AM EST ----- ?Recent labs show normal thyroid function.  This is good news.  Continue current medications and follow-up as planned.  Thank you. ? ?

## 2021-10-19 ENCOUNTER — Ambulatory Visit (HOSPITAL_COMMUNITY)
Admission: RE | Admit: 2021-10-19 | Discharge: 2021-10-19 | Disposition: A | Discharge status: Home / Self Care | Payer: 59 | Source: Ambulatory Visit | Attending: Internal Medicine | Admitting: Internal Medicine

## 2021-10-19 ENCOUNTER — Other Ambulatory Visit: Payer: Self-pay

## 2021-10-19 DIAGNOSIS — I739 Peripheral vascular disease, unspecified: Secondary | ICD-10-CM | POA: Diagnosis present

## 2021-10-19 DIAGNOSIS — Z95820 Peripheral vascular angioplasty status with implants and grafts: Secondary | ICD-10-CM | POA: Diagnosis present

## 2021-10-22 ENCOUNTER — Other Ambulatory Visit: Payer: Self-pay | Admitting: *Deleted

## 2021-10-22 DIAGNOSIS — I739 Peripheral vascular disease, unspecified: Secondary | ICD-10-CM

## 2021-11-06 ENCOUNTER — Ambulatory Visit (INDEPENDENT_AMBULATORY_CARE_PROVIDER_SITE_OTHER): Payer: 59 | Admitting: Cardiovascular Disease

## 2021-11-06 ENCOUNTER — Encounter: Payer: Self-pay | Admitting: Cardiovascular Disease

## 2021-11-06 VITALS — BP 140/98 | HR 74 | Ht 74.0 in | Wt 208.0 lb

## 2021-11-06 DIAGNOSIS — E785 Hyperlipidemia, unspecified: Secondary | ICD-10-CM

## 2021-11-06 DIAGNOSIS — I5022 Chronic systolic (congestive) heart failure: Secondary | ICD-10-CM | POA: Diagnosis not present

## 2021-11-06 DIAGNOSIS — I4819 Other persistent atrial fibrillation: Secondary | ICD-10-CM

## 2021-11-06 DIAGNOSIS — I739 Peripheral vascular disease, unspecified: Secondary | ICD-10-CM

## 2021-11-06 DIAGNOSIS — Z720 Tobacco use: Secondary | ICD-10-CM

## 2021-11-06 NOTE — Patient Instructions (Signed)
Medication Instructions:  ?No changes ?*If you need a refill on your cardiac medications before your next appointment, please call your pharmacy* ? ? ?Lab Work: ?None ordered ?If you have labs (blood work) drawn today and your tests are completely normal, you will receive your results only by: ?MyChart Message (if you have MyChart) OR ?A paper copy in the mail ?If you have any lab test that is abnormal or we need to change your treatment, we will call you to review the results. ? ? ?Testing/Procedures: ?None ordered ? ? ?Follow-Up: ?At [facility], you and your health needs are our priority.  As part of our continuing mission to provide you with exceptional heart care, we have created designated Provider Care Teams.  These Care Teams include your primary Cardiologist (physician) and Advanced Practice Providers (APPs -  Physician Assistants and Nurse Practitioners) who all work together to provide you with the care you need, when you need it. ? ?We recommend signing up for the patient portal called "MyChart".  Sign up information is provided on this After Visit Summary.  MyChart is used to connect with patients for Virtual Visits (Telemedicine).  Patients are able to view lab/test results, encounter notes, upcoming appointments, etc.  Non-urgent messages can be sent to your provider as well.   ?To learn more about what you can do with MyChart, go to [email or web address].   ? ?Your next appointment:   ?12 month(s) ? ?The format for your next appointment:   ?In Person ? ?Provider:   ?Dr. Fletcher Anon ?

## 2021-11-06 NOTE — Progress Notes (Signed)
?  ?Cardiology Office Note ? ? ?Date:  11/06/2021  ? ?ID:  Christopher Gutierrez, DOB Dec 03, 1960, MRN [id number] ? ?PCP:  Christopher Nutting, DO  ?Cardiologist: Dr. Gardiner Rhyme ? ?Chief Complaint  ?Patient presents with  ? Follow-up  ?  1 month.  ? ? ? ?  ?History of Present Illness: ?Christopher Gutierrez is a 61 y.o. male who is here today for a follow-up visit regarding peripheral arterial disease.   He has known history of atrial fibrillation/flutter, essential hypertension, tobacco use, and chronic systolic heart failure with severely reduced LV systolic function. ?The patient was diagnosed with heart failure in March of 2022 in the setting of atrial fibrillation with rapid ventricular response.  Echocardiogram showed an EF of 20 to 25%.  He underwent successful TEE guided cardioversion.  Heart failure improved gradually with medical therapy with most recent ejection fraction 40 to 45%. ? ?He was seen last year for severe left calf claudication with rest Gutierrez and ischemic changes in the toes. ?Angiography was done in August of 2022 which showed no significant aortoiliac disease, on the left, the distal SFA was occluded into the proximal popliteal artery with two-vessel runoff below the knee.  On the right side, there was severe focal stenosis in the distal SFA.  I performed successful angioplasty and 2 overlapped Biom[employer] self-expanding stents to the left SFA into the proximal popliteal artery. ?Postprocedure ABI improved to normal on the left side and was mildly reduced on the right side of 0.81.  Duplex showed normal velocities in the left SFA/popliteal artery stents. ? ?He cut down on tobacco use to half a pack per day.  There is history of excessive alcohol use but not recently. ? ?He denies claudication at the present time.  Most recent Doppler study showed normal ABI on the left side with patent SFA/popliteal stents.  ABI was moderately reduced on the right side likely due to known SFA disease.  In spite of  that, he denies right calf claudication. ? ?Past Medical History:  ?Diagnosis Date  ? Atrial fibrillation ([place]) 10/24/2020  ? Atrial fibrillation and flutter ([place])   ? History of artificial lens replacement 2016  ? on his Left eye  ? Hypertension   ? ? ?Past Surgical History:  ?Procedure Laterality Date  ? ABDOMINAL AORTOGRAM W/LOWER EXTREMITY N/A 03/07/2021  ? Procedure: ABDOMINAL AORTOGRAM W/LOWER EXTREMITY;  Surgeon: Christopher Hampshire, MD;  Location: [place] [facility];  Service: Cardiovascular;  Laterality: N/A;  ? CARDIOVERSION N/A 11/01/2020  ? Procedure: CARDIOVERSION;  Surgeon: Christopher Pain, MD;  [facility]: [facility] ENDOSCOPY;  Service: Cardiovascular;  Laterality: N/A;  ? CATARACT EXTRACTION Right   ? MOUTH SURGERY  2013  ? PERIPHERAL VASCULAR INTERVENTION Left 03/07/2021  ? Procedure: PERIPHERAL VASCULAR INTERVENTION;  Surgeon: Christopher Hampshire, MD;  Location: [place] [facility];  Service: Cardiovascular;  Laterality: Left;  left SFA  ? TEE WITHOUT CARDIOVERSION N/A 11/01/2020  ? Procedure: TRANSESOPHAGEAL ECHOCARDIOGRAM (TEE);  Surgeon: Christopher Pain, MD;  Location: [facility] ENDOSCOPY;  Service: Cardiovascular;  Laterality: N/A;  ? ? ? ?Current Outpatient Medications  ?Medication Sig Dispense Refill  ? acetaminophen (TYLENOL) 325 MG tablet Take 650 mg by mouth every 6 (six) hours as needed for moderate Gutierrez or headache.    ? apixaban (ELIQUIS) 5 MG TABS tablet TAKE 1 TABLET BY MOUTH TWICE A DAY (Patient taking differently: Take 5 mg by mouth 2 (two) times daily.) 180 tablet 1  ? bisoprolol (ZEBETA) 5 MG tablet  Take 0.5 tablets (2.5 mg total) by mouth daily. 45 tablet 3  ? cetirizine (ZYRTEC) 10 MG tablet Take 10 mg by mouth as needed for allergies.    ? dapagliflozin propanediol (FARXIGA) 10 MG TABS tablet Take 1 tablet (10 mg total) by mouth daily before breakfast. NEEDS FOLLOW UP APPOINTMENT FOR ANYMORE REFILLS 30 tablet 3  ? digoxin (LANOXIN) 0.125 MG tablet Take 125 mcg by mouth daily.    ? ENTRESTO 97-103 MG TAKE 1  TABLET BY MOUTH TWICE A DAY 60 tablet 6  ? Famotidine-Ca Carb-Mag Hydrox (PEPCID COMPLETE PO) Take 1 tablet by mouth daily as needed (acid reflux).    ? Nicotine 10 MG/ML SOLN 1-2 puffs as needed 10 mL 2  ? nicotine polacrilex (COMMIT) 4 MG lozenge Take 4 mg by mouth as needed for smoking cessation.    ? rosuvastatin (CRESTOR) 20 MG tablet TAKE 1 TABLET BY MOUTH EVERY DAY 90 tablet 2  ? spironolactone (ALDACTONE) 25 MG tablet Take 1 tablet (25 mg total) by mouth daily. 90 tablet 3  ? traMADol (ULTRAM) 50 MG tablet Take 50 mg by mouth every 6 (six) hours as needed for moderate Gutierrez.    ? ?No current facility-administered medications for this visit.  ? ? ?Allergies:   Beta adrenergic blockers and Tape  ? ? ?Social History:  The patient  reports that he has been smoking cigarettes. He has a 37.50 pack-year smoking history. His smokeless tobacco use includes chew. He reports current alcohol use of about 24.0 standard drinks per week. He reports current drug use. Frequency: 2.00 times per week. Drug: Marijuana.  ? ?Family History:  The patient's family history includes Birth defects in his paternal aunt; COPD in his maternal uncle; Gout in his brother; Hypertension in his brother; Stroke in his paternal aunt.  ? ? ?ROS:  Please see the history of present illness.   Otherwise, review of systems are positive for .   All other systems are reviewed and negative.  ? ? ?PHYSICAL EXAM: ?VS:  BP (!) 140/98 (BP Location: Left Arm, Patient Position: Sitting, Cuff Size: Normal)   Pulse 74   Ht '6\' 2"'$  (1.88 m)   Wt 208 lb (94.3 kg)   BMI 26.71 kg/m?  , BMI Body mass index is 26.71 kg/m?. ?GEN: Well nourished, well developed, in no acute distress  ?HEENT: normal  ?Neck: no JVD, carotid bruits, or masses ?Cardiac: RRR; no murmurs, rubs, or gallops,no edema  ?Respiratory:  clear to auscultation bilaterally, normal work of breathing ?GI: soft, nontender, nondistended, + BS ?MS: no deformity or atrophy  ?Skin: warm and dry, no  rash ?Neuro:  Strength and sensation are intact ?Psych: euthymic mood, full affect ?Distal pulses are palpable on the left side. ? ? ?EKG:  EKG is not ordered today. ? ? ? ?Recent Labs: ?01/26/2021: B Natriuretic Peptide 385.9 ?09/01/2021: ALT 32; BUN 12; Creatinine, Ser 1.02; Hemoglobin 17.7; Magnesium 2.2; Platelets 172; Potassium 4.2; Sodium 139 ?10/11/2021: TSH 2.670  ? ? ?Lipid Panel ?   ?Component Value Date/Time  ? CHOL 229 (H) 01/26/2021 1213  ? TRIG 105 01/26/2021 1213  ? HDL 69 01/26/2021 1213  ? CHOLHDL 3.3 01/26/2021 1213  ? VLDL 21 01/26/2021 1213  ? [place] 139 (H) 01/26/2021 1213  ? ?  ? ?Wt Readings from Last 3 Encounters:  ?11/06/21 208 lb (94.3 kg)  ?10/11/21 203 lb 9.6 oz (92.4 kg)  ?09/01/21 198 lb (89.8 kg)  ?  ? ? ? ? ?   ?  View : No data to display.  ?  ?  ?  ? ? ? ? ?ASSESSMENT AND PLAN: ? ?1.  Peripheral arterial disease:   Status post  endovascular intervention on the left SFA and popliteal artery with complete resolution of symptoms and normalization of ABI.   ?He does have residual right SFA disease with moderately reduced ABI but he denies right leg claudication at the present time.   ?Thus, recommend continuing medical therapy.  He is not on antiplatelet medications given that he is on Eliquis. ? ?2.  Chronic systolic heart failure: He appears to be euvolemic without furosemide.  He is currently on bisoprolol, Farxiga, spironolactone and Entresto. ? ?3.  Persistent atrial fibrillation: He is maintaining in sinus rhythm and tolerating anticoagulation. ? ?4.  Tobacco use: He cut down but has not been able to quit smoking completely.  I again discussed with him the importance of smoking cessation.   ? ?5.  Hyperlipidemia: Continue treatment with rosuvastatin with target LDL of less than 70. ? ? ? ?Disposition: Follow-up with Dr. Gilman Schmidt as planned for his cardiac issues and follow-up with me in 1 year for peripheral arterial disease or earlier if needed. ? ?Signed, ? ?Kathlyn Sacramento, MD   ?11/06/2021 10:40 AM    ?[place] ? ?

## 2021-11-13 ENCOUNTER — Encounter: Payer: Self-pay | Admitting: Cardiovascular Disease

## 2021-11-18 NOTE — Progress Notes (Signed)
?Cardiology Office Note:   ? ?Date:  11/22/2021  ? ?ID:  Christopher Gutierrez, DOB [birth year], MRN [id number] ? ?PCP:  [name], DO  ?Cardiologist:  [name], MD  ?Electrophysiologist:  None  ? ?Referring MD: [name], DO  ? ?Chief Complaint  ?Patient presents with  ? Congestive Heart Failure  ? ? ?History of Present Illness:   ? ?Christopher Gutierrez is a 61 y.o. male with a hx of HTN, tobacco use, alcohol use who presents for follow-up.  He was referred by Dr. [name] for evaluation of atrial fibrillation.  At initial clinic visit on 10/27/20, he appeared hypervolemic on exam and was in A. fib with rates up to 160s.  Discussed admission to the hospital for diuresis, rate control of his A. fib and eventual TEE/DCCV, but he declined.  At follow-up visit on 10/30/2020 he appeared more volume overloaded on direct admission was arranged.  Echo showed EF 20 to 25%, reduced RV function.  He had good response to IV diuretics and underwent successful TEE/DCCV on 11/01/2020 with restoration of sinus rhythm.  Initially followed with Dr. [name] in advanced heart failure after his hospitalization.  He was started on Entresto, spironolactone, and bisoprolol.  Repeat echocardiogram 01/26/2021 showed significant improvement in EF to 45 to 50%, and normal RV function. ? ?Since last clinic visit, he reports has been doing okay.  He had episode of atrial flutter in January, went to ED and required cardioversion.  Reports has been feeling fatigued.  Also with occasional chest pain.  Describes chest pain as left-sided pain that occurs about once per week.  Usually occurs with exertion.  Will last 10 to 15 seconds and resolve with rest.  Also has been short of breath with exertion.  Report has been having leg pain with walking, but improved since underwent stenting.  He denies any lightheadedness, syncope, lower extremity edema.  Reports occasional palpitations that last for short period.  He continues to  smoke, but is less than 0.5 packs/day.  Has also cut down on his chewing tobacco. ? ? ?Wt Readings from Last 3 Encounters:  ?11/22/21 207 lb (93.9 kg)  ?11/06/21 208 lb (94.3 kg)  ?10/11/21 203 lb 9.6 oz (92.4 kg)  ? ? ? ?Past Medical History:  ?Diagnosis Date  ? Atrial fibrillation ([place]) 10/24/2020  ? Atrial fibrillation and flutter ([place])   ? History of artificial lens replacement 2016  ? on his Left eye  ? Hypertension   ? ? ?Past Surgical History:  ?Procedure Laterality Date  ? ABDOMINAL AORTOGRAM W/LOWER EXTREMITY N/A 03/07/2021  ? Procedure: ABDOMINAL AORTOGRAM W/LOWER EXTREMITY;  Surgeon: Wellington Hampshire, MD;  Location: [place] [facility];  Service: Cardiovascular;  Laterality: N/A;  ? CARDIOVERSION N/A 11/01/2020  ? Procedure: CARDIOVERSION;  Surgeon: Jerline Pain, MD;  Location: [facility] ENDOSCOPY;  Service: Cardiovascular;  Laterality: N/A;  ? CATARACT EXTRACTION Right   ? MOUTH SURGERY  2013  ? PERIPHERAL VASCULAR INTERVENTION Left 03/07/2021  ? Procedure: PERIPHERAL VASCULAR INTERVENTION;  Surgeon: Wellington Hampshire, MD;  Location: [place] [facility];  Service: Cardiovascular;  Laterality: Left;  left SFA  ? TEE WITHOUT CARDIOVERSION N/A 11/01/2020  ? Procedure: TRANSESOPHAGEAL ECHOCARDIOGRAM (TEE);  Surgeon: Jerline Pain, MD;  Location: [facility] ENDOSCOPY;  Service: Cardiovascular;  Laterality: N/A;  ? ? ?Current Medications: ?Current Meds  ?Medication Sig  ? apixaban (ELIQUIS) 5 MG TABS tablet TAKE 1 TABLET BY MOUTH TWICE A DAY (Patient taking differently: Take 5 mg  by mouth 2 (two) times daily.)  ? bisoprolol (ZEBETA) 5 MG tablet Take 0.5 tablets (2.5 mg total) by mouth daily.  ? dapagliflozin propanediol (FARXIGA) 10 MG TABS tablet Take 1 tablet (10 mg total) by mouth daily before breakfast. NEEDS FOLLOW UP APPOINTMENT FOR ANYMORE REFILLS  ? ENTRESTO 97-103 MG TAKE 1 TABLET BY MOUTH TWICE A DAY  ? metoprolol tartrate (LOPRESSOR) 50 MG tablet Take 1 tablet (50 mg total) by mouth as directed. One time dose 2 hours  prior to Ct scan.  ? Nicotine 10 MG/ML SOLN 1-2 puffs as needed  ? nicotine polacrilex (COMMIT) 4 MG lozenge Take 4 mg by mouth as needed for smoking cessation.  ? rosuvastatin (CRESTOR) 20 MG tablet TAKE 1 TABLET BY MOUTH EVERY DAY  ? [DISCONTINUED] spironolactone (ALDACTONE) 25 MG tablet Take 1 tablet (25 mg total) by mouth daily.  ?  ? ?Allergies:   Beta adrenergic blockers and Tape  ? ?Social History  ? ?Socioeconomic History  ? Marital status: Divorced  ?  Spouse name: Not on file  ? Number of children: 1  ? Years of education: Not on file  ? Highest education level: Master's degree (e.g., MA, MS, MEng, MEd, MSW, MBA)  ?Occupational History  ? Not on file  ?Tobacco Use  ? Smoking status: Some Days  ?  Packs/day: 1.50  ?  Years: 25.00  ?  Pack years: 37.50  ?  Types: Cigarettes  ? Smokeless tobacco: Current  ?  Types: Chew  ? Tobacco comments:  ?  05/17/21 Ronalee Belts has cut down smoking to 1/2 to 1 ppd.  Not ready to quit.  ?  11/22/2021 Smokes some days  ?Vaping Use  ? Vaping Use: Never used  ?Substance and Sexual Activity  ? Alcohol use: Yes  ?  Alcohol/week: 24.0 standard drinks  ?  Types: 24 Cans of beer per week  ?  Comment: 6 cans every other day  ? Drug use: Yes  ?  Frequency: 2.0 times per week  ?  Types: Marijuana  ?  Comment: he took Marijuana 2 weeks ago  ? Sexual activity: Yes  ?  Partners: Female  ?Other Topics Concern  ? Not on file  ?Social History Narrative  ? Not on file  ? ?Social Determinants of Health  ? ?Financial Resource Strain: Not on file  ?Food Insecurity: Not on file  ?Transportation Needs: Not on file  ?Physical Activity: Not on file  ?Stress: Not on file  ?Social Connections: Not on file  ?  ? ?Family History: ?The patient's family history includes Birth defects in his paternal aunt; COPD in his maternal uncle; Gout in his brother; Hypertension in his brother; Stroke in his paternal aunt. ? ?ROS:   ?Please see the history of present illness.    ? All other systems reviewed and are  negative. ? ?EKGs/Labs/Other Studies Reviewed:   ? ?The following studies were reviewed today: ? ? ?EKG:  EKG is ordered today.  The ekg ordered today demonstrates atrial fibrillation with RVR, rate 136 ? ?Recent Labs: ?01/26/2021: B Natriuretic Peptide 385.9 ?09/01/2021: ALT 32; BUN 12; Creatinine, Ser 1.02; Hemoglobin 17.7; Magnesium 2.2; Platelets 172; Potassium 4.2; Sodium 139 ?10/11/2021: TSH 2.670  ?Recent Lipid Panel ?   ?Component Value Date/Time  ? CHOL 229 (H) 01/26/2021 1213  ? TRIG 105 01/26/2021 1213  ? HDL 69 01/26/2021 1213  ? CHOLHDL 3.3 01/26/2021 1213  ? VLDL 21 01/26/2021 1213  ? [place] 139 (H) 01/26/2021 1213  ? ? ?  Physical Exam:   ? ?VS:  BP (!) 154/102 (BP Location: Left Arm, Patient Position: Sitting, Cuff Size: Normal)   Pulse 73   Ht '6\' 2"'$  (1.88 m)   Wt 207 lb (93.9 kg)   SpO2 98%   BMI 26.58 kg/m?    ? ?Wt Readings from Last 3 Encounters:  ?11/22/21 207 lb (93.9 kg)  ?11/06/21 208 lb (94.3 kg)  ?10/11/21 203 lb 9.6 oz (92.4 kg)  ?  ? ?GEN:  in no acute distress ?HEENT: Normal ?NECK: + JVD ?CARDIAC: Irregular, tachycardic, no murmurs ?RESPIRATORY:  Clear to auscultation without rales, wheezing or rhonchi  ?ABDOMEN: Soft, non-tender, non-distended ?MUSCULOSKELETAL: 1+ edema; No deformity  ?SKIN: Warm and dry ?NEUROLOGIC:  Alert and oriented x 3 ?PSYCHIATRIC:  Normal affect  ? ?ASSESSMENT:   ? ?1. Chronic combined systolic and diastolic congestive heart failure ([place])   ?2. Chest pain, unspecified type   ?3. Persistent atrial fibrillation ([place])   ?4. Essential hypertension   ?5. Tobacco use   ?6. PAD (peripheral artery disease) ([place])   ? ? ?PLAN:   ? ?Chronic combined systolic and diastolic heart failure: New diagnosis in the setting of afib w/rvr, alcohol use and heavy smoking history. 10/2020 EF 20%, BIV failure. ECHO 01/26/21 with significant improvement in EF 45-50%, normal RV function.  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 ?-NYHA Class II symptoms, euvolemic on exam ?-Increase  spironolactone to 25 mg daily.  Check BMET, magnesium ?-continue entresto 97/103 ?-continue bisoprolol 2.5 mgdaily ?-Continue Farxiga 10 mg daily ?  ?Persistent Afib:new diagnosis 10/2020. S/p TEE/DCCV 11/01/20.  Required

## 2021-11-22 ENCOUNTER — Encounter: Payer: Self-pay | Admitting: Cardiology

## 2021-11-22 ENCOUNTER — Other Ambulatory Visit: Payer: Self-pay | Admitting: *Deleted

## 2021-11-22 ENCOUNTER — Ambulatory Visit (INDEPENDENT_AMBULATORY_CARE_PROVIDER_SITE_OTHER): Payer: 59 | Admitting: Cardiology

## 2021-11-22 VITALS — BP 154/102 | HR 73 | Ht 74.0 in | Wt 207.0 lb

## 2021-11-22 DIAGNOSIS — R079 Chest pain, unspecified: Secondary | ICD-10-CM

## 2021-11-22 DIAGNOSIS — I739 Peripheral vascular disease, unspecified: Secondary | ICD-10-CM

## 2021-11-22 DIAGNOSIS — I5042 Chronic combined systolic (congestive) and diastolic (congestive) heart failure: Secondary | ICD-10-CM

## 2021-11-22 DIAGNOSIS — I10 Essential (primary) hypertension: Secondary | ICD-10-CM

## 2021-11-22 DIAGNOSIS — Z720 Tobacco use: Secondary | ICD-10-CM

## 2021-11-22 DIAGNOSIS — I4819 Other persistent atrial fibrillation: Secondary | ICD-10-CM

## 2021-11-22 MED ORDER — METOPROLOL TARTRATE 50 MG PO TABS: 50.0000 mg | ORAL_TABLET | ORAL | 0 refills | Status: DC

## 2021-11-22 MED ORDER — SPIRONOLACTONE 25 MG PO TABS: 25.0000 mg | ORAL_TABLET | Freq: Every day | ORAL | 3 refills | Status: DC

## 2021-11-22 NOTE — Patient Instructions (Signed)
Medication Instructions:  ? ?INCREASE Aldactone one (1) tablet by mouth ( 25 mg) daily.  ? ?*If you need a refill on your cardiac medications before your next appointment, please call your pharmacy* ? ? ?Lab Work: ? ?TODAY!!!!  BMET/MAG/LIPID ? ?If you have labs (blood work) drawn today and your tests are completely normal, you will receive your results only by: ?MyChart Message (if you have MyChart) OR ?A paper copy in the mail ?If you have any lab test that is abnormal or we need to change your treatment, we will call you to review the results. ? ? ?Testing/Procedures: ? ? ? ?Your cardiac CT will be scheduled at one of the below locations:  ? ?[facility] ?[address] ?[place], [place] [address] ?[phone] ? ?If scheduled at [facility], please arrive at the [facility] and Children's Entrance (Entrance C2) of [facility] 30 minutes prior to test start time. ?You can use the FREE valet parking offered at entrance C (encouraged to control the heart rate for the test)  ?Proceed to the [facility] Radiology Department (first floor) to check-in and test prep. ? ?All radiology patients and guests should use entrance C2 at [facility], accessed from [facility], even though the hospital's physical address listed is [address]. ? ? ? ?If scheduled at [facility], please arrive 15 mins early for check-in and test prep. ? ?Please follow these instructions carefully (unless otherwise directed): ? ?Hold all erectile dysfunction medications at least 3 days (72 hrs) prior to test. ? ?On the Night Before the Test: ?Be sure to Drink plenty of water. ?Do not consume any caffeinated/decaffeinated beverages or chocolate 12 hours prior to your test. ?Do not take any antihistamines 12 hours prior to your test. ? ? ?On the Day of the Test: ?Drink plenty of water until 1 hour prior to the test. ?Do not eat any food 4 hours prior to the test. ?You  may take your regular medications prior to the test.  ?Take metoprolol (Lopressor) two hours prior to test. HOLD Bisoprolol the AM of test ? ?After the Test: ?Drink plenty of water. ?After receiving IV contrast, you may experience a mild flushed feeling. This is normal. ?On occasion, you may experience a mild rash up to 24 hours after the test. This is not dangerous. If this occurs, you can take Benadryl 25 mg and increase your fluid intake. ?If you experience trouble breathing, this can be serious. If it is severe call 911 IMMEDIATELY. If it is mild, please call our office. ? ? ?We will call to schedule your test 2-4 weeks out understanding that some insurance companies will need an authorization prior to the service being performed.  ? ?For non-scheduling related questions, please contact the cardiac imaging nurse navigator should you have any questions/concerns: ?Marchia Bond, Cardiac Imaging Nurse Navigator ?Gordy Clement, Cardiac Imaging Nurse Navigator ?[facility] ?Direct Office Dial: [phone]  ? ?For scheduling needs, including cancellations and rescheduling, please call [place], [phone].  ? ? ?Follow-Up: ?At [facility], you and your health needs are our priority.  As part of our continuing mission to provide you with exceptional heart care, we have created designated Provider Care Teams.  These Care Teams include your primary Cardiologist (physician) and Advanced Practice Providers (APPs -  Physician Assistants and Nurse Practitioners) who all work together to provide you with the care you need, when you need it. ? ?We  recommend signing up for the patient portal called "MyChart".  Sign up information is provided on this After Visit Summary.  MyChart is used to connect with patients for Virtual Visits (Telemedicine).  Patients are able to view lab/test results, encounter notes, upcoming appointments, etc.  Non-urgent messages can be sent to your provider as well.    ?To learn more about what you can do with MyChart, go to [email or web address].   ? ?Your next appointment:   ?8 week(s) ? ?The format for your next appointment:   ?In Person ? ?Provider:   ?[name], MD   ? ? ?Other Instructions ? ?You have been referred to EP at [address] to discuss ablation.  You should be hearing from that office to schedule you appointment.  ? ?You have been referred to talk to Avelino Leeds about smoking cessation.  ? ? ?Important Information About Sugar ? ? ? ? ?  ?

## 2021-11-23 LAB — BASIC METABOLIC PANEL
BUN/Creatinine Ratio: 13 (ref 9–20)
BUN: 17 mg/dL (ref 6–24)
CO2: 23 mmol/L (ref 20–29)
Calcium: 9.6 mg/dL (ref 8.7–10.2)
Chloride: 103 mmol/L (ref 96–106)
Creatinine, Ser: 1.32 mg/dL — ABNORMAL HIGH (ref 0.76–1.27)
Glucose: 117 mg/dL — ABNORMAL HIGH (ref 70–99)
Potassium: 4.8 mmol/L (ref 3.5–5.2)
Sodium: 141 mmol/L (ref 134–144)
eGFR: 63 mL/min/{1.73_m2} (ref >59)

## 2021-11-23 LAB — LIPID PANEL
Chol/HDL Ratio: 2.8 ratio (ref 0.0–5.0)
Cholesterol, Total: 181 mg/dL (ref 100–199)
HDL: 65 mg/dL (ref >39)
LDL Chol Calc (NIH): 95 mg/dL (ref 0–99)
Triglycerides: 119 mg/dL (ref 0–149)
VLDL Cholesterol Cal: 21 mg/dL (ref 5–40)

## 2021-11-23 LAB — MAGNESIUM: Magnesium: 2.3 mg/dL (ref 1.6–2.3)

## 2021-11-23 NOTE — Telephone Encounter (Signed)
We filled out paperwork at clinic visit yesterday ?

## 2021-11-27 ENCOUNTER — Other Ambulatory Visit: Payer: Self-pay | Admitting: *Deleted

## 2021-11-27 ENCOUNTER — Telehealth: Payer: Self-pay

## 2021-11-27 DIAGNOSIS — Z720 Tobacco use: Secondary | ICD-10-CM

## 2021-11-27 DIAGNOSIS — Z0000 Encounter for general adult medical examination without abnormal findings: Secondary | ICD-10-CM

## 2021-11-27 NOTE — Telephone Encounter (Signed)
Called patient to offer health coaching per referral from Dr. Gardiner Rhyme for smoking cessation. Patient did not answer. Left a message with my contact information for patient to call me back to inform me of his interest in participating in health coaching. ? ?Avelino Leeds, [place] ?[facility] ?Care Guide, Health Coach ?[address] ?[place] [address] ?Telephone: [phone] ?Email: [email or web address] ? ?

## 2021-11-29 ENCOUNTER — Telehealth: Payer: Self-pay | Admitting: Cardiology

## 2021-11-29 ENCOUNTER — Telehealth: Payer: Self-pay

## 2021-11-29 DIAGNOSIS — Z0000 Encounter for general adult medical examination without abnormal findings: Secondary | ICD-10-CM

## 2021-11-29 DIAGNOSIS — E785 Hyperlipidemia, unspecified: Secondary | ICD-10-CM

## 2021-11-29 DIAGNOSIS — I10 Essential (primary) hypertension: Secondary | ICD-10-CM

## 2021-11-29 MED ORDER — ROSUVASTATIN CALCIUM 40 MG PO TABS: 40.0000 mg | ORAL_TABLET | Freq: Every day | ORAL | 0 refills | Status: DC

## 2021-11-29 NOTE — Telephone Encounter (Signed)
Patient returning call to discuss lab results  ?

## 2021-11-29 NOTE — Telephone Encounter (Signed)
Called patient per referral from Dr. Gardiner Rhyme for smoking cessation. Patient is interested in participating in health coaching and has been scheduled for his initial session on 5/1 at 1:00pm. Patient will be called at that time. Patient was emailed a copy of the [employer], Code of Ethics, and [employer].  ? ? ?Christopher Gutierrez, [place] ?[facility] ?Care Guide, Health Coach ?[address] ?[place] [address] ?Telephone: [phone] ?Email: [email or web address] ? ?

## 2021-11-29 NOTE — Telephone Encounter (Signed)
Pt aware of lab results and agrees with  plan ./cy ?

## 2021-12-03 ENCOUNTER — Telehealth (HOSPITAL_COMMUNITY): Payer: Self-pay | Admitting: *Deleted

## 2021-12-03 ENCOUNTER — Ambulatory Visit (INDEPENDENT_AMBULATORY_CARE_PROVIDER_SITE_OTHER): Payer: 59

## 2021-12-03 DIAGNOSIS — Z0000 Encounter for general adult medical examination without abnormal findings: Secondary | ICD-10-CM

## 2021-12-03 NOTE — Telephone Encounter (Signed)
Reaching out to patient to offer assistance regarding upcoming cardiac imaging study; pt verbalizes understanding of appt date/time, parking situation and where to check in, pre-test NPO status and medications ordered, and verified current allergies; name and call back number provided for further questions should they arise ? ?Gordy Clement RN Navigator Cardiac Imaging ?[facility] ?[phone] office ?[phone] cell ? ?Patient to take '50mg'$  metoprolol tartrate two hours prior his cardiac CT scan.  He is aware to arrive at 2:45pm. ?

## 2021-12-03 NOTE — Progress Notes (Signed)
Appointment Outcome: Completed, Session #: Initial ?Start time: 1:02pm   End time: 1:56pm   Total Mins: 54 minutes ? ? ? ?AGREEMENTS SECTION ? ? ?Overall Goal(s): ?Smoking cessation - Quit Date (TBD) ?Stress management                                    ? ?Agreement/Action Steps:  ?Smoking Cessation ?Practice mini quits ?Smoke 1/2 cigarette at a time ?Switch to short cigarettes ?Monitor smoking behavior ?Track how many cigarettes are smoked per day ? ? ?Stress management ?Continue to read ?Practice deep breathing ?Take a walk for 10 minutes ?To manage urge to smoke or to extend mini quits ? ? ?Progress Notes:  ?Patient stated that he smokes 10 or more cigarettes per day but has reduced smoking recently. Patient shared that he typically smokes a whole cigarette unless he must do something. Patient currently smoke longs and lite cigarettes. Patient shared that he has tried to quit twice before. Patient stated that he quit smoking cold [place] for a few months each time until he experienced a two life changing events. Patient has stopped chewing nicotine tobacco completely.  ? ?Patient stated that now his smoking is out of habit, but he does smoke more when he is stressed. Patient shared that when he wakes up, he must smoke and may smoke two cigarettes in a row. Patient stated that after his morning cigarettes he can go four hours without smoking depending on what he is doing. Patient mentioned that he also smokes while driving.  ? ?Patient stated that having health issues has motivated him to change his behaviors, but more so because he has become conscious of his behaviors and the need to make changes.  ? ?Asked patient how he was currently managing stress outside of smoking. Patient stated that he typically read and watch tv to relax.  ? ? ?Coaching Outcomes: ?Emailed patient the [employer], Code of Ethics, and Quit4Life workbook.  ? ?Patient will switch his cigarettes to shorts instead of longs. Patient will  smoke 1/2 cigarette at a time. Patient will be practicing mini quits to extend the time between when he smokes the next 1/2 cigarette.  ? ?Patient will continue to read to relax and manage stress. Patient will take walks for 10 minutes to help reduce stress or to extend a mini quit. Patient will practice deep breathing to reduce stress and curb the urge to smoke.  ? ?Will discuss setting a quit date during next session.  ? ?

## 2021-12-04 ENCOUNTER — Encounter (HOSPITAL_COMMUNITY): Payer: Self-pay

## 2021-12-04 ENCOUNTER — Ambulatory Visit (HOSPITAL_COMMUNITY)
Admission: RE | Admit: 2021-12-04 | Discharge: 2021-12-04 | Disposition: A | Discharge status: Home / Self Care | Payer: 59 | Source: Ambulatory Visit | Attending: Cardiology | Admitting: Cardiology

## 2021-12-04 DIAGNOSIS — R931 Abnormal findings on diagnostic imaging of heart and coronary circulation: Secondary | ICD-10-CM | POA: Diagnosis present

## 2021-12-04 DIAGNOSIS — I2510 Atherosclerotic heart disease of native coronary artery without angina pectoris: Secondary | ICD-10-CM | POA: Diagnosis not present

## 2021-12-04 DIAGNOSIS — R079 Chest pain, unspecified: Secondary | ICD-10-CM | POA: Diagnosis present

## 2021-12-04 MED ORDER — NITROGLYCERIN 0.4 MG SL SUBL
0.8000 mg | SUBLINGUAL_TABLET | Freq: Once | SUBLINGUAL | Status: AC
  Administered 2021-12-04: 0.8 mg via SUBLINGUAL

## 2021-12-04 MED ORDER — IOHEXOL 350 MG/ML SOLN
100.0000 mL | Freq: Once | INTRAVENOUS | Status: AC | PRN
  Administered 2021-12-04: 100 mL via INTRAVENOUS

## 2021-12-04 MED ORDER — NITROGLYCERIN 0.4 MG SL SUBL
SUBLINGUAL_TABLET | SUBLINGUAL | Status: AC
  Filled 2021-12-04: qty 2

## 2021-12-06 ENCOUNTER — Ambulatory Visit (HOSPITAL_COMMUNITY)
Admission: RE | Admit: 2021-12-06 | Discharge: 2021-12-06 | Disposition: A | Discharge status: Home / Self Care | Payer: 59 | Source: Ambulatory Visit | Attending: Cardiology | Admitting: Cardiology

## 2021-12-06 ENCOUNTER — Other Ambulatory Visit: Payer: Self-pay | Admitting: Cardiology

## 2021-12-06 ENCOUNTER — Other Ambulatory Visit: Payer: Self-pay | Admitting: *Deleted

## 2021-12-06 DIAGNOSIS — R079 Chest pain, unspecified: Secondary | ICD-10-CM | POA: Diagnosis not present

## 2021-12-06 DIAGNOSIS — R931 Abnormal findings on diagnostic imaging of heart and coronary circulation: Secondary | ICD-10-CM

## 2021-12-10 NOTE — H&P (View-Only) (Signed)
Cardiology Office Note:    Date:  12/13/2021   ID:  Christopher Gutierrez, DOB [birth year], MRN [id number]  PCP:  [name], DO  Cardiologist:  [name], MD  Electrophysiologist:  None   Referring MD: [name], DO   Chief Complaint  Patient presents with   Coronary Artery Disease    History of Present Illness:    Christopher Gutierrez is a 61 y.o. male with a hx of HTN, tobacco use, alcohol use who presents for follow-up.  He was referred by Dr. [name] for evaluation of atrial fibrillation.  At initial clinic visit on 10/27/20, he appeared hypervolemic on exam and was in A. fib with rates up to 160s.  Discussed admission to the hospital for diuresis, rate control of his A. fib and eventual TEE/DCCV, but he declined.  At follow-up visit on 10/30/2020 he appeared more volume overloaded on direct admission was arranged.  Echo showed EF 20 to 25%, reduced RV function.  He had good response to IV diuretics and underwent successful TEE/DCCV on 11/01/2020 with restoration of sinus rhythm.  Initially followed with Dr. [name] in advanced heart failure after his hospitalization.  He was started on Entresto, spironolactone, and bisoprolol.  Repeat echocardiogram 01/26/2021 showed significant improvement in EF to 45 to 50%, and normal RV function.  Coronary CTA was done on 12/04/2021, which showed obstructive CAD in mid RCA  Since last clinic visit, he reports that he is doing okay.  He denies recent chest pain but reports pain only occurs with significant exertion and he has been limiting his exertion.  Denies any shortness of breath, lightheadedness, syncope, lower extremity edema, or palpitations.  He continues to smoke, about 0.5 ppd  Wt Readings from Last 3 Encounters:  12/13/21 211 lb 9.6 oz (96 kg)  11/22/21 207 lb (93.9 kg)  11/06/21 208 lb (94.3 kg)   BP Readings from Last 3 Encounters:  12/13/21 (!) 140/100  12/04/21 (!) 164/94  11/22/21 (!) 154/102     Past  Medical History:  Diagnosis Date   Atrial fibrillation ([place]) 10/24/2020   Atrial fibrillation and flutter ([place])    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   Hypertension     Past Surgical History:  Procedure Laterality Date   ABDOMINAL AORTOGRAM W/LOWER EXTREMITY N/A 03/07/2021   Procedure: ABDOMINAL AORTOGRAM W/LOWER EXTREMITY;  Surgeon: Wellington Hampshire, MD;  Location: [facility];  Service: Cardiovascular;  Laterality: N/A;   CARDIOVERSION N/A 11/01/2020   Procedure: CARDIOVERSION;  Surgeon: Jerline Pain, MD;  Location: [facility]COPY;  Service: Cardiovascular;  Laterality: N/A;   CATARACT EXTRACTION Right    MOUTH SURGERY  2013   PERIPHERAL VASCULAR INTERVENTION Left 03/07/2021   Procedure: PERIPHERAL VASCULAR INTERVENTION;  Surgeon: Wellington Hampshire, MD;  Location: [facility];  Service: Cardiovascular;  Laterality: Left;  left SFA   TEE WITHOUT CARDIOVERSION N/A 11/01/2020   Procedure: TRANSESOPHAGEAL ECHOCARDIOGRAM (TEE);  Surgeon: Jerline Pain, MD;  Location: [facility] ENDOSCOPY;  Service: Cardiovascular;  Laterality: N/A;    Current Medications: Current Meds  Medication Sig   acetaminophen (TYLENOL) 325 MG tablet Take 650 mg by mouth every 6 (six) hours as needed for moderate pain or headache.   apixaban (ELIQUIS) 5 MG TABS tablet TAKE 1 TABLET BY MOUTH TWICE A DAY (Patient taking differently: Take 5 mg by mouth 2 (two) times daily.)   cetirizine (ZYRTEC) 10 MG tablet Take 10 mg by mouth  as needed for allergies.   dapagliflozin propanediol (FARXIGA) 10 MG TABS tablet Take 1 tablet (10 mg total) by mouth daily before breakfast. NEEDS FOLLOW UP APPOINTMENT FOR ANYMORE REFILLS   ENTRESTO 97-103 MG TAKE 1 TABLET BY MOUTH TWICE A DAY   Famotidine-Ca Carb-Mag Hydrox (PEPCID COMPLETE PO) Take 1 tablet by mouth daily as needed (acid reflux).   nitroGLYCERIN (NITROSTAT) 0.4 MG SL tablet Place 1 tablet (0.4 mg total) under the tongue every 5 (five) minutes as  needed.   rosuvastatin (CRESTOR) 40 MG tablet Take 1 tablet (40 mg total) by mouth daily.   spironolactone (ALDACTONE) 25 MG tablet Take 1 tablet (25 mg total) by mouth daily.   [DISCONTINUED] bisoprolol (ZEBETA) 5 MG tablet Take 0.5 tablets (2.5 mg total) by mouth daily.     Allergies:   Beta adrenergic blockers and Tape   Social History   Socioeconomic History   Marital status: Divorced    Spouse name: Not on file   Number of children: 1   Years of education: Not on file   Highest education level: Master's degree (e.g., MA, MS, MEng, MEd, MSW, MBA)  Occupational History   Not on file  Tobacco Use   Smoking status: Some Days    Packs/day: 1.50    Years: 25.00    Pack years: 37.50    Types: Cigarettes   Smokeless tobacco: Current    Types: Chew   Tobacco comments:    05/17/21 Ronalee Belts has cut down smoking to 1/2 to 1 ppd.  Not ready to quit.    11/22/2021 Smokes some days  Vaping Use   Vaping Use: Never used  Substance and Sexual Activity   Alcohol use: Yes    Alcohol/week: 24.0 standard drinks    Types: 24 Cans of beer per week    Comment: 6 cans every other day   Drug use: Yes    Frequency: 2.0 times per week    Types: Marijuana    Comment: he took Marijuana 2 weeks ago   Sexual activity: Yes    Partners: Female  Other Topics Concern   Not on file  Social History Narrative   Not on file   Social Determinants of Health   Financial Resource Strain: Not on file  Food Insecurity: Not on file  Transportation Needs: Not on file  Physical Activity: Not on file  Stress: Not on file  Social Connections: Not on file     Family History: The patient's family history includes Birth defects in his paternal aunt; COPD in his maternal uncle; Gout in his brother; Hypertension in his brother; Stroke in his paternal aunt.  ROS:   Please see the history of present illness.     All other systems reviewed and are negative.  EKGs/Labs/Other Studies Reviewed:    The following  studies were reviewed today:   EKG:   12/13/2021: Sinus rhythm, rate 71, PVC, poor R wave progression Recent Labs: 01/26/2021: B Natriuretic Peptide 385.9 09/01/2021: ALT 32; Hemoglobin 17.7; Platelets 172 10/11/2021: TSH 2.670 11/22/2021: BUN 17; Creatinine, Ser 1.32; Magnesium 2.3; Potassium 4.8; Sodium 141  Recent Lipid Panel    Component Value Date/Time   CHOL 181 11/22/2021 1458   TRIG 119 11/22/2021 1458   HDL 65 11/22/2021 1458   CHOLHDL 2.8 11/22/2021 1458   CHOLHDL 3.3 01/26/2021 1213   VLDL 21 01/26/2021 1213   LDLCALC 95 11/22/2021 1458    Physical Exam:    VS:  BP (!) 140/100  Pulse 71   Ht '6\' 1"'$  (1.854 m)   Wt 211 lb 9.6 oz (96 kg)   SpO2 97%   BMI 27.92 kg/m     Wt Readings from Last 3 Encounters:  12/13/21 211 lb 9.6 oz (96 kg)  11/22/21 207 lb (93.9 kg)  11/06/21 208 lb (94.3 kg)     GEN:  in no acute distress HEENT: Normal NECK: No JVD CARDIAC: RRR no murmurs RESPIRATORY:  Clear to auscultation without rales, wheezing or rhonchi  ABDOMEN: Soft, non-tender, non-distended MUSCULOSKELETAL: No edema; No deformity  SKIN: Warm and dry NEUROLOGIC:  Alert and oriented x 3 PSYCHIATRIC:  Normal affect   ASSESSMENT:    1. Abnormal cardiac CT angiography   2. Pre-procedure lab exam   3. Coronary artery disease of native artery of native heart with stable angina pectoris ([place])   4. Chronic combined systolic and diastolic congestive heart failure (HCC)   5. Persistent atrial fibrillation ([place])   6. PAD (peripheral artery disease) ([place])   7. Hyperlipidemia, unspecified hyperlipidemia type      PLAN:    CAD: Reported exertional chest pain, coronary CTA was done on 12/04/2021, which showed obstructive CAD in mid RCA -Recommend cardiac catheterization.  Risks and benefits of cardiac catheterization have been discussed with the patient.  These include bleeding, infection, kidney damage, stroke, heart attack, death.  The patient understands these risks and is  willing to proceed.  -Continue Eliquis, statin -Increase bisoprolol to 5 mg daily -As needed sublingual nitroglycerin  Chronic combined systolic and diastolic heart failure: New diagnosis in the setting of afib w/rvr, alcohol use and heavy smoking history. 10/2020 EF 20%, BIV failure. ECHO 01/26/21 with significant improvement in EF 45-50%, normal RV function.  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 -NYHA Class II symptoms, euvolemic on exam -continue spironolactone 25 mg daily.   -continue entresto 97/103 -continue bisoprolol, will increase to 5 mg daily -Continue Farxiga 10 mg daily   Persistent Afib:new diagnosis 10/2020. S/p TEE/DCCV 11/01/20.  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  Refer to EP to evaluate for ablation  Tobacco Use: Counseled on the risks of tobacco use and cessation strongly encouraged.  Will ask care guide to work with patient to aid in cessation   Polycythemia: Likely secondary to smoking, sleep apnea. Hematology following. Jak 2 negative   Alcohol Use: Continued to cut back on alcohol use rarely drinking now   PAD: Reported severe left calf claudication with rest pain and ischemic changes in toes.  Angiography 03/2021 showed no significant aortoiliac disease on the left with distal SFA occlusion into the proximal popliteal artery, severe focal stenosis in distal right SFA.  Follows with Dr. Fletcher Anon, underwent successful angioplasty and stent placement to left SFA into proximal popliteal artery.  Postprocedure ABIs showed improvement with normal ABIs on left, mild disease (0.81) on right -Continue Eliquis -Continue rosuvastatin -Smoking cessation recommended  Hyperlipidemia: LDL 95 on 11/22/2021, on rosuvastatin 20 mg daily.  Rosuvastatin increased to 40 mg daily  RTC in 1 month  Medication Adjustments/Labs and  Tests Ordered: Current medicines are reviewed at length with the patient today.  Concerns regarding medicines are outlined above.  Orders Placed This Encounter  Procedures   Basic metabolic panel   CBC   EKG 12-Lead   Meds ordered this encounter  Medications   bisoprolol (ZEBETA) 5 MG tablet    Sig: Take 1  tablet (5 mg total) by mouth daily.    Dispense:  90 tablet    Refill:  3   nitroGLYCERIN (NITROSTAT) 0.4 MG SL tablet    Sig: Place 1 tablet (0.4 mg total) under the tongue every 5 (five) minutes as needed.    Dispense:  25 tablet    Refill:  3    Patient Instructions  INCREASE bisoprolol to 5 mg daily Take sublingual nitroglycerin AS NEEDED   [address] [place] [address] Dept: [phone] Loc: [place]  12/13/2021  You are scheduled for a Cardiac Catheterization on Monday, May 22 with Dr. Harrell Gave End.  1. Please arrive at the Main Entrance A at [facility]: [place], [place] [address] at 7:00 AM (This time is two hours before your procedure to ensure your preparation). Free valet parking service is available.   Special note: Every effort is made to have your procedure done on time. Please understand that emergencies sometimes delay scheduled procedures.  2. Diet: Do not eat solid foods after midnight.  You may have clear liquids until 5 AM upon the day of the procedure.  3. Labs: today in office (BMET, CBC)  4. Medication instructions in preparation for your procedure:   Contrast Allergy: No  Start holding Eliquis Saturday 5/20 (NO Eliquis on Saturday, Sunday or day of procedure)  Hold Farxiga and spironolactone AM of procedure  On the morning of your procedure, take Aspirin and any morning medicines NOT listed above.  You may use sips of water.  5. Plan to go home the same day, you will only stay  overnight if medically necessary. 6. You MUST have a responsible adult to drive you home. 7. An adult MUST be with you the first 24 hours after you arrive home. 8. Bring a current list of your medications, and the last time and date medication taken. 9. Bring ID and current insurance cards. 10.Please wear clothes that are easy to get on and off and wear slip-on shoes.  Thank you for allowing [place] to care for you!   -- [facility]   Follow up: 1 month with Dr. Gardiner Rhyme   Signed, [name], MD  12/13/2021 5:47 PM    [place]

## 2021-12-10 NOTE — Progress Notes (Signed)
?Cardiology Office Note:   ? ?Date:  12/13/2021  ? ?ID:  Christopher Gutierrez, DOB 1961-07-22, MRN [id number] ? ?PCP:  [name], DO  ?Cardiologist:  [name], MD  ?Electrophysiologist:  None  ? ?Referring MD: [name], DO  ? ?Chief Complaint  ?Patient presents with  ? Coronary Artery Disease  ? ? ?History of Present Illness:   ? ?Christopher Gutierrez is a 61 y.o. male with a hx of HTN, tobacco use, alcohol use who presents for follow-up.  He was referred by Dr. [name] for evaluation of atrial fibrillation.  At initial clinic visit on 10/27/20, he appeared hypervolemic on exam and was in A. fib with rates up to 160s.  Discussed admission to the hospital for diuresis, rate control of his A. fib and eventual TEE/DCCV, but he declined.  At follow-up visit on 10/30/2020 he appeared more volume overloaded on direct admission was arranged.  Echo showed EF 20 to 25%, reduced RV function.  He had good response to IV diuretics and underwent successful TEE/DCCV on 11/01/2020 with restoration of sinus rhythm.  Initially followed with Dr. [name] in advanced heart failure after his hospitalization.  He was started on Entresto, spironolactone, and bisoprolol.  Repeat echocardiogram 01/26/2021 showed significant improvement in EF to 45 to 50%, and normal RV function.  Coronary CTA was done on 12/04/2021, which showed obstructive CAD in mid RCA ? ?Since last clinic visit, he reports that he is doing okay.  He denies recent chest pain but reports pain only occurs with significant exertion and he has been limiting his exertion.  Denies any shortness of breath, lightheadedness, syncope, lower extremity edema, or palpitations.  He continues to smoke, about 0.5 ppd ? ?Wt Readings from Last 3 Encounters:  ?12/13/21 211 lb 9.6 oz (96 kg)  ?11/22/21 207 lb (93.9 kg)  ?11/06/21 208 lb (94.3 kg)  ? ?BP Readings from Last 3 Encounters:  ?12/13/21 (!) 140/100  ?12/04/21 (!) 164/94  ?11/22/21 (!) 154/102  ? ? ? ?Past  Medical History:  ?Diagnosis Date  ? Atrial fibrillation ([place]) 10/24/2020  ? Atrial fibrillation and flutter ([place])   ? History of artificial lens replacement 2016  ? on his Left eye  ? Hypertension   ? ? ?Past Surgical History:  ?Procedure Laterality Date  ? ABDOMINAL AORTOGRAM W/LOWER EXTREMITY N/A 03/07/2021  ? Procedure: ABDOMINAL AORTOGRAM W/LOWER EXTREMITY;  Surgeon: Wellington Hampshire, MD;  Location: [facility];  Service: Cardiovascular;  Laterality: N/A;  ? CARDIOVERSION N/A 11/01/2020  ? Procedure: CARDIOVERSION;  Surgeon: Jerline Pain, MD;  Location: [facility] ENDOSCOPY;  Service: Cardiovascular;  Laterality: N/A;  ? CATARACT EXTRACTION Right   ? MOUTH SURGERY  2013  ? PERIPHERAL VASCULAR INTERVENTION Left 03/07/2021  ? Procedure: PERIPHERAL VASCULAR INTERVENTION;  Surgeon: Wellington Hampshire, MD;  Location: [facility];  Service: Cardiovascular;  Laterality: Left;  left SFA  ? TEE WITHOUT CARDIOVERSION N/A 11/01/2020  ? Procedure: TRANSESOPHAGEAL ECHOCARDIOGRAM (TEE);  Surgeon: Jerline Pain, MD;  Location: [facility] ENDOSCOPY;  Service: Cardiovascular;  Laterality: N/A;  ? ? ?Current Medications: ?Current Meds  ?Medication Sig  ? acetaminophen (TYLENOL) 325 MG tablet Take 650 mg by mouth every 6 (six) hours as needed for moderate pain or headache.  ? apixaban (ELIQUIS) 5 MG TABS tablet TAKE 1 TABLET BY MOUTH TWICE A DAY (Patient taking differently: Take 5 mg by mouth 2 (two) times daily.)  ? cetirizine (ZYRTEC) 10 MG tablet Take 10 mg by mouth  as needed for allergies.  ? dapagliflozin propanediol (FARXIGA) 10 MG TABS tablet Take 1 tablet (10 mg total) by mouth daily before breakfast. NEEDS FOLLOW UP APPOINTMENT FOR ANYMORE REFILLS  ? ENTRESTO 97-103 MG TAKE 1 TABLET BY MOUTH TWICE A DAY  ? Famotidine-Ca Carb-Mag Hydrox (PEPCID COMPLETE PO) Take 1 tablet by mouth daily as needed (acid reflux).  ? nitroGLYCERIN (NITROSTAT) 0.4 MG SL tablet Place 1 tablet (0.4 mg total) under the tongue every 5 (five) minutes as  needed.  ? rosuvastatin (CRESTOR) 40 MG tablet Take 1 tablet (40 mg total) by mouth daily.  ? spironolactone (ALDACTONE) 25 MG tablet Take 1 tablet (25 mg total) by mouth daily.  ? [DISCONTINUED] bisoprolol (ZEBETA) 5 MG tablet Take 0.5 tablets (2.5 mg total) by mouth daily.  ?  ? ?Allergies:   Beta adrenergic blockers and Tape  ? ?Social History  ? ?Socioeconomic History  ? Marital status: Divorced  ?  Spouse name: Not on file  ? Number of children: 1  ? Years of education: Not on file  ? Highest education level: Master's degree (e.g., MA, MS, MEng, MEd, MSW, MBA)  ?Occupational History  ? Not on file  ?Tobacco Use  ? Smoking status: Some Days  ?  Packs/day: 1.50  ?  Years: 25.00  ?  Pack years: 37.50  ?  Types: Cigarettes  ? Smokeless tobacco: Current  ?  Types: Chew  ? Tobacco comments:  ?  05/17/21 Ronalee Belts has cut down smoking to 1/2 to 1 ppd.  Not ready to quit.  ?  11/22/2021 Smokes some days  ?Vaping Use  ? Vaping Use: Never used  ?Substance and Sexual Activity  ? Alcohol use: Yes  ?  Alcohol/week: 24.0 standard drinks  ?  Types: 24 Cans of beer per week  ?  Comment: 6 cans every other day  ? Drug use: Yes  ?  Frequency: 2.0 times per week  ?  Types: Marijuana  ?  Comment: he took Marijuana 2 weeks ago  ? Sexual activity: Yes  ?  Partners: Female  ?Other Topics Concern  ? Not on file  ?Social History Narrative  ? Not on file  ? ?Social Determinants of Health  ? ?Financial Resource Strain: Not on file  ?Food Insecurity: Not on file  ?Transportation Needs: Not on file  ?Physical Activity: Not on file  ?Stress: Not on file  ?Social Connections: Not on file  ?  ? ?Family History: ?The patient's family history includes Birth defects in his paternal aunt; COPD in his maternal uncle; Gout in his brother; Hypertension in his brother; Stroke in his paternal aunt. ? ?ROS:   ?Please see the history of present illness.    ? All other systems reviewed and are negative. ? ?EKGs/Labs/Other Studies Reviewed:   ? ?The following  studies were reviewed today: ? ? ?EKG:   ?12/13/2021: Sinus rhythm, rate 71, PVC, poor R wave progression ?Recent Labs: ?01/26/2021: B Natriuretic Peptide 385.9 ?09/01/2021: ALT 32; Hemoglobin 17.7; Platelets 172 ?10/11/2021: TSH 2.670 ?11/22/2021: BUN 17; Creatinine, Ser 1.32; Magnesium 2.3; Potassium 4.8; Sodium 141  ?Recent Lipid Panel ?   ?Component Value Date/Time  ? CHOL 181 11/22/2021 1458  ? TRIG 119 11/22/2021 1458  ? HDL 65 11/22/2021 1458  ? CHOLHDL 2.8 11/22/2021 1458  ? CHOLHDL 3.3 01/26/2021 1213  ? VLDL 21 01/26/2021 1213  ? [place] 95 11/22/2021 1458  ? ? ?Physical Exam:   ? ?VS:  BP (!) 140/100  Pulse 71   Ht '6\' 1"'$  (1.854 m)   Wt 211 lb 9.6 oz (96 kg)   SpO2 97%   BMI 27.92 kg/m?    ? ?Wt Readings from Last 3 Encounters:  ?12/13/21 211 lb 9.6 oz (96 kg)  ?11/22/21 207 lb (93.9 kg)  ?11/06/21 208 lb (94.3 kg)  ?  ? ?GEN:  in no acute distress ?HEENT: Normal ?NECK: No JVD ?CARDIAC: RRR no murmurs ?RESPIRATORY:  Clear to auscultation without rales, wheezing or rhonchi  ?ABDOMEN: Soft, non-tender, non-distended ?MUSCULOSKELETAL: No edema; No deformity  ?SKIN: Warm and dry ?NEUROLOGIC:  Alert and oriented x 3 ?PSYCHIATRIC:  Normal affect  ? ?ASSESSMENT:   ? ?1. Abnormal cardiac CT angiography   ?2. Pre-procedure lab exam   ?3. Coronary artery disease of native artery of native heart with stable angina pectoris ([place])   ?4. Chronic combined systolic and diastolic congestive heart failure ([place])   ?5. Persistent atrial fibrillation ([place])   ?6. PAD (peripheral artery disease) ([place])   ?7. Hyperlipidemia, unspecified hyperlipidemia type   ? ? ? ?PLAN:   ? ?CAD: Reported exertional chest pain, coronary CTA was done on 12/04/2021, which showed obstructive CAD in mid RCA ?-Recommend cardiac catheterization.  Risks and benefits of cardiac catheterization have been discussed with the patient.  These include bleeding, infection, kidney damage, stroke, heart attack, death.  The patient understands these risks and is  willing to proceed.  ?-Continue Eliquis, statin ?-Increase bisoprolol to 5 mg daily ?-As needed sublingual nitroglycerin ? ?Chronic combined systolic and diastolic heart failure: New diagnosis in the setting

## 2021-12-13 ENCOUNTER — Encounter: Payer: Self-pay | Admitting: Cardiology

## 2021-12-13 ENCOUNTER — Ambulatory Visit (INDEPENDENT_AMBULATORY_CARE_PROVIDER_SITE_OTHER): Payer: 59 | Admitting: Cardiology

## 2021-12-13 VITALS — BP 140/100 | HR 71 | Ht 73.0 in | Wt 211.6 lb

## 2021-12-13 DIAGNOSIS — R931 Abnormal findings on diagnostic imaging of heart and coronary circulation: Secondary | ICD-10-CM

## 2021-12-13 DIAGNOSIS — Z01812 Encounter for preprocedural laboratory examination: Secondary | ICD-10-CM | POA: Diagnosis not present

## 2021-12-13 DIAGNOSIS — I25118 Atherosclerotic heart disease of native coronary artery with other forms of angina pectoris: Secondary | ICD-10-CM | POA: Diagnosis not present

## 2021-12-13 DIAGNOSIS — I5042 Chronic combined systolic (congestive) and diastolic (congestive) heart failure: Secondary | ICD-10-CM | POA: Diagnosis not present

## 2021-12-13 DIAGNOSIS — I4819 Other persistent atrial fibrillation: Secondary | ICD-10-CM

## 2021-12-13 DIAGNOSIS — I739 Peripheral vascular disease, unspecified: Secondary | ICD-10-CM

## 2021-12-13 DIAGNOSIS — E785 Hyperlipidemia, unspecified: Secondary | ICD-10-CM

## 2021-12-13 MED ORDER — BISOPROLOL FUMARATE 5 MG PO TABS: 5.0000 mg | ORAL_TABLET | Freq: Every day | ORAL | 3 refills | Status: DC

## 2021-12-13 MED ORDER — NITROGLYCERIN 0.4 MG SL SUBL: 0.4000 mg | SUBLINGUAL_TABLET | SUBLINGUAL | 3 refills | Status: DC | PRN

## 2021-12-13 NOTE — Patient Instructions (Addendum)
INCREASE bisoprolol to 5 mg daily ?Take sublingual nitroglycerin AS NEEDED ? ? ?[facility] [place] ?[place] ?[address] ?[place] [address] ?Dept: [phone] ?Loc: [phone] ? ?Christopher Gutierrez  12/13/2021 ? ?You are scheduled for a Cardiac Catheterization on Monday, May 22 with Dr. Harrell Gave End. ? ?1. Please arrive at the Main Entrance A at [facility]: [place], [place] [address] at 7:00 AM (This time is two hours before your procedure to ensure your preparation). Free valet parking service is available.  ? ?Special note: Every effort is made to have your procedure done on time. Please understand that emergencies sometimes delay scheduled procedures. ? ?2. Diet: Do not eat solid foods after midnight.  You may have clear liquids until 5 AM upon the day of the procedure. ? ?3. Labs: today in office (BMET, CBC) ? ?4. Medication instructions in preparation for your procedure: ? ? Contrast Allergy: No ? ?Start holding Eliquis Saturday 5/20 (NO Eliquis on Saturday, Sunday or day of procedure) ? ?Hold Farxiga and spironolactone AM of procedure ? ?On the morning of your procedure, take Aspirin and any morning medicines NOT listed above.  You may use sips of water. ? ?5. Plan to go home the same day, you will only stay overnight if medically necessary. ?6. You MUST have a responsible adult to drive you home. ?7. An adult MUST be with you the first 24 hours after you arrive home. ?8. Bring a current list of your medications, and the last time and date medication taken. ?9. Bring ID and current insurance cards. ?10.Please wear clothes that are easy to get on and off and wear slip-on shoes. ? ?Thank you for allowing [place] to care for you! ?  -- [facility] ? ? ?Follow up: 1 month with Dr. Gardiner Rhyme ?

## 2021-12-14 LAB — CBC
Hematocrit: 51.1 % — ABNORMAL HIGH (ref 37.5–51.0)
Hemoglobin: 17.6 g/dL (ref 13.0–17.7)
MCH: 32.8 pg (ref 26.6–33.0)
MCHC: 34.4 g/dL (ref 31.5–35.7)
MCV: 95 fL (ref 79–97)
Platelets: 161 10*3/uL (ref 150–450)
RBC: 5.36 x10E6/uL (ref 4.14–5.80)
RDW: 11.6 % (ref 11.6–15.4)
WBC: 9.1 10*3/uL (ref 3.4–10.8)

## 2021-12-14 LAB — BASIC METABOLIC PANEL
BUN/Creatinine Ratio: 13 (ref 9–20)
BUN: 14 mg/dL (ref 6–24)
CO2: 20 mmol/L (ref 20–29)
Calcium: 9.3 mg/dL (ref 8.7–10.2)
Chloride: 102 mmol/L (ref 96–106)
Creatinine, Ser: 1.12 mg/dL (ref 0.76–1.27)
Glucose: 119 mg/dL — ABNORMAL HIGH (ref 70–99)
Potassium: 4.5 mmol/L (ref 3.5–5.2)
Sodium: 137 mmol/L (ref 134–144)
eGFR: 76 mL/min/{1.73_m2} (ref >59)

## 2021-12-17 ENCOUNTER — Ambulatory Visit (INDEPENDENT_AMBULATORY_CARE_PROVIDER_SITE_OTHER): Payer: 59

## 2021-12-17 DIAGNOSIS — Z0000 Encounter for general adult medical examination without abnormal findings: Secondary | ICD-10-CM

## 2021-12-17 NOTE — Progress Notes (Signed)
Appointment Outcome: Completed, Session #: 1 ?Start time: 1:08pm   End time: 1:40pm   Total Mins: 32 minutes ? ?AGREEMENTS SECTION ? ? ? ?Overall Goal(s): ?Smoking cessation - Quit Date (TBD) ?Stress management                                    ?  ?Agreement/Action Steps:  ?Smoking Cessation ?Practice mini quits ?Smoke 1/2 cigarette at a time ?Switch to short cigarettes ?Monitor smoking behavior ?Track how many cigarettes are smoked per day ?  ?  ?Stress management ?Continue to read ?Practice deep breathing ?Take a walk for 10 minutes ?To manage urge to smoke or to extend mini quits ? ?Progress Notes:  ?Patient stated that he has limited himself to 8 cigarettes per day (4 cigarettes in the am and 4 cigarettes in the pm). Patient stated that he was able to maintain smoking 8 cigarette per day until this past weekend. Patient expressed that he is currently grieving, and it impacted him smoking more during that time. Patient is trying to hold himself to only smoking 8 cigarettes per day over the next two weeks.  ? ?Patient stated that he smoked a whole cigarette instead of 1/2 cigarette at a time. However, patient shared that she lights a cigarette and take a few puffs, hold it and let it burn up some before taking a few more puffs. Patient stated that he wasn't smoking consistently to smoke majority of the whole cigarette. Patient shared a time that he walked around with a cigarette but never smoked it. Patient stated that he finds having something in his hand comforting.  ? ?Patient mentioned that he has been able to practice mini quits and the longest time he has been able to go 3-4 hours between smoking 1-2 cigarettes in the morning before smoking again around noon or later. Patient shared that he switched to short cigarettes and was able to stick smoking them for a while until he purchased another pack and forgot to get shorts. Patient reported that he has 7 or 8 cigarettes left of his long cigarettes and will be  going back to get shorts.  ? ?Patient continues to read and practice deep breathing to help manage stress. Patient stated that although he practices deep breathing, he still thinks about things all the time. Patient reported that he has not taken walks to manage urges to smoke or to extend mini quits when he is stressed. Patient stated that he has been going outside to get fresh air and to work in his garden. Patient finds gardening relaxing and give him an opportunity to get out of his head.  ? ?Indicators of Success and Accountability:  Patient stated that he has been trying not to worry or get upset easily to help manage his stress.  ?Readiness: Patient is in the action phase of smoking cessation and stress management.  ?Strengths and Supports: Patient currently does not have support. Patient is relying on his determination to quit smoking.  ?Challenges and Barriers: Patient is grieving at this time, which could be a challenge to reducing his smoking and managing his stress.   ? ?Coaching Outcomes: ?Patient has considered writing in a journal to help manage stress and express himself since he does not have his support person. Patient will write in the evenings at least 2-3 days per week.  ? ?Emailed patient deep breathing exercises, information on self-check-ins, and  positive self-talk for his review to determine if he would like to implement these as action steps. ? ?Patient has decided not to set a quit date to avoid adding additional stress at this time.  ? ?Patient will implement the following action steps over the next two weeks.  ? ? ? ?Agreement/Action Steps:  ?Smoking Cessation ?Practice mini quits ?Smoke 1/2 cigarette at a time ?Switch to short cigarettes ?Monitor smoking behavior ?Track how many cigarettes are smoked per day ?  ?Stress management ?Continue to read ?Gardening ?Practice deep breathing ?Write in journal 2-3 evenings/week ? ? ?Attempted: ?Fulfilled - Patient has been practicing mini quits,  monitoring his smoking behavior to track how many cigarettes are smoked per day. Patient continues to read and practice deep breathing.  ?Partial - Patient switched to short cigarettes for some time.  ?Not met - Patient did not walk for 10 minutes to manage urges to smoke or extend mini quits during times when he is stressed.  ?

## 2021-12-19 ENCOUNTER — Telehealth: Payer: Self-pay | Admitting: Licensed Clinical Social Worker

## 2021-12-19 ENCOUNTER — Telehealth: Payer: Self-pay | Admitting: Cardiology

## 2021-12-19 NOTE — Telephone Encounter (Signed)
Patient needs transportation for procedure on Monday. He does not want to call any transportation services and wants to stay overnight.  ?

## 2021-12-19 NOTE — Telephone Encounter (Signed)
LCSW attempted to reach pt regarding assistance w/ transportation/support person needed for post procedure support.  ?No answer at [phone] (phone went straight to voicemail). Confirmed pt will need a support person post procedure which may be something that our team is limited in providing. Will discuss with our team and f/u as able.  ? ?Westley Hummer, MSW, LCSW ?Clinical Social Worker II ?[facility]  ?[phone]- work cell phone (preferred) ?[phone]- desk phone ? ?

## 2021-12-19 NOTE — Telephone Encounter (Addendum)
Patient needs transportation for his procedure on Monday, May 22,2023. ? ? ?

## 2021-12-19 NOTE — Telephone Encounter (Signed)
Pt returned my call [phone]). He shares that he could drive himself to cath but does not have someone to pick him up from procedure. He does live with his father who is able to monitor him/contact help should pt have any issues post procedure. Pt does have financial means to get a cab but is worried it will not get him there in time. Would like to know more specifics about timing and what is needed exactly. LCSW has contacted Angie Fava and Webb Silversmith, RN team to ensure full details are known and collaborate appropriately with pt. ? ?Westley Hummer, MSW, LCSW ?Clinical Social Worker II ?[facility]  ?[phone]- work cell phone (preferred) ?[phone]- desk phone ? ?

## 2021-12-20 ENCOUNTER — Encounter: Payer: Self-pay | Admitting: Cardiology

## 2021-12-20 ENCOUNTER — Telehealth: Payer: Self-pay | Admitting: *Deleted

## 2021-12-20 ENCOUNTER — Telehealth: Payer: Self-pay

## 2021-12-20 DIAGNOSIS — Z0000 Encounter for general adult medical examination without abnormal findings: Secondary | ICD-10-CM

## 2021-12-20 NOTE — Telephone Encounter (Signed)
Reviewed instructions with patient, patient does not have transportation home, plan will be to admit overnight, RN cath lab aware.

## 2021-12-20 NOTE — Telephone Encounter (Signed)
Called patient to reschedule his upcoming health coaching session on May 30th at 1:00pm due to being doubled-booked during that time. Asked patient if we could keep appointment scheduled for May 30th, but move the time to 1:30pm. Patient verbally agreed to the change in time for his health coaching appointment over the phone. Patient has been rescheduled for May 30th at 1:30pm and will be called at this time.    Kiesha Ensey Truman Hayward, [facility] Guide, Health Coach [address], [address] [place] [address] Telephone: [phone] Email: [email or web address]

## 2021-12-20 NOTE — Telephone Encounter (Signed)
Spoke to patient he stated eye Dr prescribed Latanoprost eye gtts yesterday for glaucoma.He wanted to make sure ok to take.I will send message to [place].

## 2021-12-20 NOTE — Telephone Encounter (Signed)
Pt will drive himself to cath, stay overnight and drive himself home when cleared per Webb Silversmith, RN. Remain available as needed, sent pt a text message (permission given yesterday) regarding knowledge of current plan.   Westley Hummer, MSW, [place]  [phone]- work cell phone (preferred) [phone]- desk phone

## 2021-12-20 NOTE — Telephone Encounter (Signed)
See duplicate note. 

## 2021-12-20 NOTE — Telephone Encounter (Addendum)
Cardiac Catheterization scheduled at [facility] for: Monday Dec 24, 2021 9 AM Arrival time and place: [facility] Main Entrance A at: 7 AM   Nothing to eat after midnight prior to procedure, clear liquids until 5 AM day of procedure.  Medication instructions: -Hold:  Eliquis-none 12/22/21 until post procedure  Farxiga-AM of procedure/ Spironolactone -takes PM-will hold PM prior to procedure -Except hold medications usual morning medications can be taken with sips of water including aspirin 81 mg.  Confirmed patient has responsible adult to drive home post procedure and be with patient first 24 hours after arriving home *  Patient reports no new symptoms concerning for COVID-19/no exposure to COVID-19 in the past 10 days.  Reviewed instructions with patient, patient does not have transportation home, plan will be to admit overnight, RN cath lab aware.

## 2021-12-24 ENCOUNTER — Encounter (HOSPITAL_COMMUNITY)
Admission: RE | Disposition: A | Discharge status: Home / Self Care | Payer: 59 | Source: Home / Self Care | Attending: Internal Medicine

## 2021-12-24 ENCOUNTER — Observation Stay (HOSPITAL_COMMUNITY)
Admission: RE | Admit: 2021-12-24 | Discharge: 2021-12-25 | Disposition: A | Discharge status: Home / Self Care | Payer: 59 | Attending: Internal Medicine | Admitting: Internal Medicine

## 2021-12-24 ENCOUNTER — Encounter (HOSPITAL_COMMUNITY): Payer: Self-pay | Admitting: Internal Medicine

## 2021-12-24 ENCOUNTER — Other Ambulatory Visit: Payer: Self-pay

## 2021-12-24 DIAGNOSIS — I4819 Other persistent atrial fibrillation: Secondary | ICD-10-CM | POA: Diagnosis not present

## 2021-12-24 DIAGNOSIS — I5042 Chronic combined systolic (congestive) and diastolic (congestive) heart failure: Secondary | ICD-10-CM | POA: Insufficient documentation

## 2021-12-24 DIAGNOSIS — I739 Peripheral vascular disease, unspecified: Secondary | ICD-10-CM | POA: Diagnosis not present

## 2021-12-24 DIAGNOSIS — Z7901 Long term (current) use of anticoagulants: Secondary | ICD-10-CM | POA: Diagnosis not present

## 2021-12-24 DIAGNOSIS — Z955 Presence of coronary angioplasty implant and graft: Secondary | ICD-10-CM

## 2021-12-24 DIAGNOSIS — E785 Hyperlipidemia, unspecified: Secondary | ICD-10-CM | POA: Insufficient documentation

## 2021-12-24 DIAGNOSIS — F17210 Nicotine dependence, cigarettes, uncomplicated: Secondary | ICD-10-CM | POA: Diagnosis not present

## 2021-12-24 DIAGNOSIS — I200 Unstable angina: Secondary | ICD-10-CM

## 2021-12-24 DIAGNOSIS — I25119 Atherosclerotic heart disease of native coronary artery with unspecified angina pectoris: Secondary | ICD-10-CM | POA: Diagnosis not present

## 2021-12-24 DIAGNOSIS — I25110 Atherosclerotic heart disease of native coronary artery with unstable angina pectoris: Secondary | ICD-10-CM

## 2021-12-24 DIAGNOSIS — Z79899 Other long term (current) drug therapy: Secondary | ICD-10-CM | POA: Insufficient documentation

## 2021-12-24 DIAGNOSIS — I110 Hypertensive heart disease with heart failure: Secondary | ICD-10-CM | POA: Diagnosis not present

## 2021-12-24 HISTORY — PX: LEFT HEART CATH AND CORONARY ANGIOGRAPHY: CATH118249

## 2021-12-24 HISTORY — PX: CORONARY STENT INTERVENTION: CATH118234

## 2021-12-24 LAB — POCT ACTIVATED CLOTTING TIME
Activated Clotting Time: 293 seconds
Activated Clotting Time: 353 seconds

## 2021-12-24 SURGERY — LEFT HEART CATH AND CORONARY ANGIOGRAPHY
Anesthesia: LOCAL

## 2021-12-24 MED ORDER — BISOPROLOL FUMARATE 5 MG PO TABS
5.0000 mg | ORAL_TABLET | Freq: Every evening | ORAL | Status: DC
Start: 2021-12-24 — End: 2021-12-25
  Administered 2021-12-24: 5 mg via ORAL
  Filled 2021-12-24: qty 1

## 2021-12-24 MED ORDER — FENTANYL CITRATE (PF) 100 MCG/2ML IJ SOLN
INTRAMUSCULAR | Status: DC | PRN
  Administered 2021-12-24 (×2): 25 ug via INTRAVENOUS
  Administered 2021-12-24: 50 ug via INTRAVENOUS

## 2021-12-24 MED ORDER — SODIUM CHLORIDE 0.9% FLUSH: 3.0000 mL | INTRAVENOUS | Status: DC | PRN

## 2021-12-24 MED ORDER — SODIUM CHLORIDE 0.9% FLUSH
3.0000 mL | Freq: Two times a day (BID) | INTRAVENOUS | Status: DC
  Administered 2021-12-24: 3 mL via INTRAVENOUS

## 2021-12-24 MED ORDER — HEPARIN SODIUM (PORCINE) 1000 UNIT/ML IJ SOLN
INTRAMUSCULAR | Status: AC
  Filled 2021-12-24: qty 10

## 2021-12-24 MED ORDER — SODIUM CHLORIDE 0.9 % WEIGHT BASED INFUSION
3.0000 mL/kg/h | INTRAVENOUS | Status: DC
  Administered 2021-12-24: 3 mL/kg/h via INTRAVENOUS

## 2021-12-24 MED ORDER — HYDRALAZINE HCL 20 MG/ML IJ SOLN
10.0000 mg | INTRAMUSCULAR | Status: AC | PRN
  Administered 2021-12-24 (×2): 10 mg via INTRAVENOUS

## 2021-12-24 MED ORDER — SODIUM CHLORIDE 0.9 % IV SOLN: 250.0000 mL | INTRAVENOUS | Status: DC | PRN

## 2021-12-24 MED ORDER — CALCIUM CARBONATE ANTACID 500 MG PO CHEW: 1.0000 | CHEWABLE_TABLET | Freq: Every day | ORAL | Status: DC | PRN

## 2021-12-24 MED ORDER — SACUBITRIL-VALSARTAN 97-103 MG PO TABS
1.0000 | ORAL_TABLET | Freq: Two times a day (BID) | ORAL | Status: DC
  Administered 2021-12-24 – 2021-12-25 (×2): 1 via ORAL
  Filled 2021-12-24 (×2): qty 1

## 2021-12-24 MED ORDER — ASPIRIN 81 MG PO CHEW: 81.0000 mg | CHEWABLE_TABLET | ORAL | Status: DC

## 2021-12-24 MED ORDER — FENTANYL CITRATE (PF) 100 MCG/2ML IJ SOLN
INTRAMUSCULAR | Status: AC
  Filled 2021-12-24: qty 2

## 2021-12-24 MED ORDER — FAMOTIDINE 20 MG PO TABS: 10.0000 mg | ORAL_TABLET | Freq: Every day | ORAL | Status: DC | PRN

## 2021-12-24 MED ORDER — VERAPAMIL HCL 2.5 MG/ML IV SOLN
INTRAVENOUS | Status: AC
  Filled 2021-12-24: qty 2

## 2021-12-24 MED ORDER — SODIUM CHLORIDE 0.9 % IV SOLN: INTRAVENOUS | Status: AC

## 2021-12-24 MED ORDER — CLOPIDOGREL BISULFATE 300 MG PO TABS
ORAL_TABLET | ORAL | Status: DC | PRN
Start: 2021-12-24 — End: 2021-12-24
  Administered 2021-12-24: 300 mg via ORAL

## 2021-12-24 MED ORDER — SPIRONOLACTONE 25 MG PO TABS
25.0000 mg | ORAL_TABLET | Freq: Every day | ORAL | Status: DC
  Administered 2021-12-24 – 2021-12-25 (×2): 25 mg via ORAL
  Filled 2021-12-24 (×2): qty 1

## 2021-12-24 MED ORDER — MIDAZOLAM HCL 2 MG/2ML IJ SOLN
INTRAMUSCULAR | Status: DC | PRN
  Administered 2021-12-24 (×2): 1 mg via INTRAVENOUS

## 2021-12-24 MED ORDER — LIDOCAINE HCL (PF) 1 % IJ SOLN
INTRAMUSCULAR | Status: AC
  Filled 2021-12-24: qty 30

## 2021-12-24 MED ORDER — VERAPAMIL HCL 2.5 MG/ML IV SOLN
INTRAVENOUS | Status: DC | PRN
  Administered 2021-12-24: 10 mL via INTRA_ARTERIAL

## 2021-12-24 MED ORDER — ACETAMINOPHEN 325 MG PO TABS
650.0000 mg | ORAL_TABLET | Freq: Four times a day (QID) | ORAL | Status: DC | PRN
  Administered 2021-12-24: 650 mg via ORAL
  Filled 2021-12-24: qty 2

## 2021-12-24 MED ORDER — CLOPIDOGREL BISULFATE 75 MG PO TABS
75.0000 mg | ORAL_TABLET | Freq: Once | ORAL | Status: AC
  Administered 2021-12-24: 75 mg via ORAL
  Filled 2021-12-24: qty 1

## 2021-12-24 MED ORDER — ONDANSETRON HCL 4 MG/2ML IJ SOLN: 4.0000 mg | Freq: Four times a day (QID) | INTRAMUSCULAR | Status: DC | PRN

## 2021-12-24 MED ORDER — CLOPIDOGREL BISULFATE 300 MG PO TABS
ORAL_TABLET | ORAL | Status: AC
  Filled 2021-12-24: qty 1

## 2021-12-24 MED ORDER — NITROGLYCERIN 1 MG/10 ML FOR IR/CATH LAB
INTRA_ARTERIAL | Status: DC | PRN
  Administered 2021-12-24 (×2): 200 ug via INTRACORONARY

## 2021-12-24 MED ORDER — HEPARIN SODIUM (PORCINE) 1000 UNIT/ML IJ SOLN
INTRAMUSCULAR | Status: DC | PRN
  Administered 2021-12-24 (×2): 4500 [IU] via INTRAVENOUS

## 2021-12-24 MED ORDER — SODIUM CHLORIDE 0.9 % WEIGHT BASED INFUSION: 1.0000 mL/kg/h | INTRAVENOUS | Status: DC

## 2021-12-24 MED ORDER — CLOPIDOGREL BISULFATE 300 MG PO TABS
ORAL_TABLET | ORAL | Status: AC
Start: 2021-12-24 — End: ?
  Filled 2021-12-24: qty 1

## 2021-12-24 MED ORDER — ASPIRIN 81 MG PO CHEW
81.0000 mg | CHEWABLE_TABLET | Freq: Every day | ORAL | Status: DC
  Administered 2021-12-25: 81 mg via ORAL
  Filled 2021-12-24: qty 1

## 2021-12-24 MED ORDER — DAPAGLIFLOZIN PROPANEDIOL 10 MG PO TABS
10.0000 mg | ORAL_TABLET | Freq: Every day | ORAL | Status: DC
Start: 2021-12-25 — End: 2021-12-25
  Administered 2021-12-25: 10 mg via ORAL
  Filled 2021-12-24: qty 1

## 2021-12-24 MED ORDER — HYDRALAZINE HCL 20 MG/ML IJ SOLN
INTRAMUSCULAR | Status: AC
  Filled 2021-12-24: qty 1

## 2021-12-24 MED ORDER — HEPARIN (PORCINE) IN NACL 1000-0.9 UT/500ML-% IV SOLN
INTRAVENOUS | Status: AC
  Filled 2021-12-24: qty 1000

## 2021-12-24 MED ORDER — NITROGLYCERIN 0.4 MG SL SUBL: 0.4000 mg | SUBLINGUAL_TABLET | SUBLINGUAL | Status: DC | PRN

## 2021-12-24 MED ORDER — CLOPIDOGREL BISULFATE 75 MG PO TABS
75.0000 mg | ORAL_TABLET | Freq: Every morning | ORAL | Status: DC
Start: 2021-12-25 — End: 2021-12-25
  Administered 2021-12-25: 75 mg via ORAL
  Filled 2021-12-24: qty 1

## 2021-12-24 MED ORDER — ROSUVASTATIN CALCIUM 20 MG PO TABS
40.0000 mg | ORAL_TABLET | Freq: Every evening | ORAL | Status: DC
  Administered 2021-12-24: 40 mg via ORAL
  Filled 2021-12-24: qty 2

## 2021-12-24 MED ORDER — IOHEXOL 350 MG/ML SOLN
INTRAVENOUS | Status: DC | PRN
  Administered 2021-12-24: 70 mL

## 2021-12-24 MED ORDER — NITROGLYCERIN 1 MG/10 ML FOR IR/CATH LAB
INTRA_ARTERIAL | Status: AC
  Filled 2021-12-24: qty 10

## 2021-12-24 MED ORDER — FAMOTIDINE-CA CARB-MAG HYDROX 10-800-165 MG PO CHEW: 1.0000 | CHEWABLE_TABLET | Freq: Every day | ORAL | Status: DC | PRN

## 2021-12-24 MED ORDER — HEPARIN (PORCINE) IN NACL 1000-0.9 UT/500ML-% IV SOLN
INTRAVENOUS | Status: DC | PRN
  Administered 2021-12-24 (×2): 500 mL

## 2021-12-24 MED ORDER — SODIUM CHLORIDE 0.9% FLUSH: 3.0000 mL | Freq: Two times a day (BID) | INTRAVENOUS | Status: DC

## 2021-12-24 MED ORDER — MIDAZOLAM HCL 2 MG/2ML IJ SOLN
INTRAMUSCULAR | Status: AC
  Filled 2021-12-24: qty 2

## 2021-12-24 MED ORDER — LIDOCAINE HCL (PF) 1 % IJ SOLN
INTRAMUSCULAR | Status: DC | PRN
  Administered 2021-12-24: 2 mL

## 2021-12-24 SURGICAL SUPPLY — 18 items
BALL SAPPHIRE NC24 3.25X15 (BALLOONS) ×2
BALLN EMERGE MR 2.25X8 (BALLOONS)
BALLN EUPHORA RX 2.25X10 (BALLOONS) ×1 IMPLANT
BALLOON EMERGE MR 2.25X8 (BALLOONS) IMPLANT
BALLOON SAPPHIRE NC24 3.25X15 (BALLOONS) IMPLANT
CATH 5FR JL3.5 JR4 ANG PIG MP (CATHETERS) ×1 IMPLANT
CATH LAUNCHER 6FR JR4 (CATHETERS) ×1 IMPLANT
GLIDESHEATH SLEND SS 6F .021 (SHEATH) ×1 IMPLANT
GUIDEWIRE INQWIRE 1.5J.035X260 (WIRE) IMPLANT
INQWIRE 1.5J .035X260CM (WIRE) ×2
KIT ENCORE 26 ADVANTAGE (KITS) ×1 IMPLANT
KIT HEART LEFT (KITS) ×2 IMPLANT
PACK CARDIAC CATHETERIZATION (CUSTOM PROCEDURE TRAY) ×2 IMPLANT
STENT SYNERGY XD 2.75X20 (Permanent Stent) IMPLANT
SYNERGY XD 2.75X20 (Permanent Stent) ×2 IMPLANT
TRANSDUCER W/STOPCOCK (MISCELLANEOUS) ×2 IMPLANT
TUBING CIL FLEX 10 FLL-RA (TUBING) ×2 IMPLANT
WIRE RUNTHROUGH .014X180CM (WIRE) ×1 IMPLANT

## 2021-12-24 NOTE — Interval H&P Note (Signed)
History and Physical Interval Note:  12/24/2021 8:58 AM  Christopher Gutierrez  has presented today for surgery, with the diagnosis of accelerating angina and abnormal cardiac CTA.  The various methods of treatment have been discussed with the patient and family. After consideration of risks, benefits and other options for treatment, the patient has consented to  Procedure(s): LEFT HEART CATH AND CORONARY ANGIOGRAPHY (N/A) as a surgical intervention.  The patient's history has been reviewed, patient examined, no change in status, stable for surgery.  I have reviewed the patient's chart and labs.  Questions were answered to the patient's satisfaction.    Cath Lab Visit (complete for each Cath Lab visit)  Clinical Evaluation Leading to the Procedure:   ACS: No.  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  Prior CABG: No previous CABG  [name]

## 2021-12-24 NOTE — Progress Notes (Signed)
Patient arrived to holding area with Transradial band present on right radial artery. 3cm hematoma present. Manual pressure applied for 5 minutes. TR band repositioned 1/2 inch proximal. Band reinflated to 15cc.  No additional hematoma noted. Existing hematoma softer 2cm x 3cm , area marked. Spo2 97% as measured in right thumb.

## 2021-12-25 ENCOUNTER — Encounter (HOSPITAL_COMMUNITY): Payer: Self-pay | Admitting: Internal Medicine

## 2021-12-25 ENCOUNTER — Telehealth (HOSPITAL_COMMUNITY): Payer: Self-pay

## 2021-12-25 DIAGNOSIS — I25119 Atherosclerotic heart disease of native coronary artery with unspecified angina pectoris: Secondary | ICD-10-CM | POA: Diagnosis not present

## 2021-12-25 DIAGNOSIS — I110 Hypertensive heart disease with heart failure: Secondary | ICD-10-CM

## 2021-12-25 DIAGNOSIS — I200 Unstable angina: Secondary | ICD-10-CM | POA: Diagnosis not present

## 2021-12-25 DIAGNOSIS — I5022 Chronic systolic (congestive) heart failure: Secondary | ICD-10-CM | POA: Diagnosis not present

## 2021-12-25 DIAGNOSIS — E785 Hyperlipidemia, unspecified: Secondary | ICD-10-CM | POA: Diagnosis not present

## 2021-12-25 DIAGNOSIS — E782 Mixed hyperlipidemia: Secondary | ICD-10-CM

## 2021-12-25 DIAGNOSIS — I480 Paroxysmal atrial fibrillation: Secondary | ICD-10-CM

## 2021-12-25 DIAGNOSIS — I5042 Chronic combined systolic (congestive) and diastolic (congestive) heart failure: Secondary | ICD-10-CM | POA: Diagnosis not present

## 2021-12-25 LAB — CBC
HCT: 46.1 % (ref 39.0–52.0)
Hemoglobin: 15.8 g/dL (ref 13.0–17.0)
MCH: 33.4 pg (ref 26.0–34.0)
MCHC: 34.3 g/dL (ref 30.0–36.0)
MCV: 97.5 fL (ref 80.0–100.0)
Platelets: 135 10*3/uL — ABNORMAL LOW (ref 150–400)
RBC: 4.73 MIL/uL (ref 4.22–5.81)
RDW: 11.7 % (ref 11.5–15.5)
WBC: 8.3 10*3/uL (ref 4.0–10.5)
nRBC: 0 % (ref 0.0–0.2)

## 2021-12-25 LAB — BASIC METABOLIC PANEL
Anion gap: 6 (ref 5–15)
BUN: 14 mg/dL (ref 6–20)
CO2: 23 mmol/L (ref 22–32)
Calcium: 8.7 mg/dL — ABNORMAL LOW (ref 8.9–10.3)
Chloride: 109 mmol/L (ref 98–111)
Creatinine, Ser: 1.07 mg/dL (ref 0.61–1.24)
GFR, Estimated: >60 mL/min (ref >60)
Glucose, Bld: 98 mg/dL (ref 70–99)
Potassium: 3.8 mmol/L (ref 3.5–5.1)
Sodium: 138 mmol/L (ref 135–145)

## 2021-12-25 NOTE — Telephone Encounter (Signed)
Duplicate referral

## 2021-12-25 NOTE — Progress Notes (Signed)
Spoke with [employer], [place], [place] regarding patients understanding he would be driving home. He spoke with MD and made me aware he could drive himself home.    Richardean Canal RN, BSN, CCRN-K

## 2021-12-25 NOTE — Progress Notes (Signed)
CARDIAC REHAB PHASE I   PRE:  Rate/Rhythm: 61 SR    BP: sitting 149/95    SaO2:   MODE:  Ambulation: 470 ft   POST:  Rate/Rhythm: 78 SR    BP: sitting 158/103     SaO2:   Tolerated well, slow pace. Discussed with pt stent, Plavix, restrictions, smoking cessation, diet, exercise, NTG and CRPII. Pt receptive with many questions. He is generally anxious. He is working on smoking cessation and is seeing [facility] cessation program. He has cut down but stress is a problem. Discussed methods of coping with stress. Will refer to [facility]. He understands the importance of Plavix. [place], ACSM 12/25/2021 9:59 AM

## 2021-12-25 NOTE — Discharge Summary (Addendum)
Discharge Summary    Patient ID: Christopher Gutierrez MRN: [id number]; DOB: 01-20-1961  Admit date: 12/24/2021 Discharge date: 12/25/2021  PCP:  [name], [place] [facility] Cardiologist:  [name], MD   {   Discharge Diagnoses    Principal Problem:   Accelerating angina [facility]) CAD Paroxysmal atrial fibrillation Chronic combined CHF Tobacco smoking Alcohol use Chronic combined CHF  Diagnostic Studies/Procedures    CORONARY STENT INTERVENTION  LEFT HEART CATH AND CORONARY ANGIOGRAPHY   Conclusion  Conclusions: Severe single-vessel coronary artery disease with 80-90% mid RCA stenosis.  There is mild-moderate, nonobstructive disease involving the left coronary artery. Low normal left ventricular systolic function (LVEF 42-87%) with upper normal filling pressure (LVEDP 15 mmHg). Successful PCI to mid RCA using Synergy 2.75 x 20 mm drug-eluting stent (postdilated to 3.3 mm) with 0% residual stenosis and TIMI-3 flow.   Recommendations: Admit for overnight observation given lack of transportation to accommodate same-day discharge post PCI. If no evidence of bleeding or vascular injury, restart apixaban tomorrow morning.  Recommend concurrent apixaban and clopidogrel for at least 6 months.  Aspirin can be held after discharge if the patient remains on apixaban and clopidogrel. Continue secondary prevention of coronary artery disease and goal-directed medical therapy for management of chronic HFrEF with recovered ejection fraction.    Diagnostic Dominance: Right Intervention   History of Present Illness     Christopher Gutierrez is a 61 y.o. 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  Diagnosis with atrial fibrillation 10/2020.  Echo showed low LV function at 20 to 25% with reduced RV function.  He underwent TEE cardioversion with restoration of sinus rhythm.  Treated with guideline directed medical therapy for heart failure.  Repeat echocardiogram 01/2021 showed improved LV function at 45 to 50% and normal RV function.  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  Most recently underwent coronary CTA showing obstructive disease in mid RCA. He denies recent chest pain but reports pain only occurs with significant exertion and he has been limiting his exertion.  Denies any shortness of breath, lightheadedness, syncope, lower extremity edema, or palpitations.  He continues to smoke, about 0.5 ppd.   Hospital Course     Consultants: None  1.  CAD Cath showed severe single-vessel coronary artery disease with 80-90% mid RCA stenosis. There is mild-moderate, nonobstructive disease involving the left coronary artery. S/p Successful PCI to mid RCA using Synergy 2.75 x 20 mm drug-eluting stent.  Patient had hematoma post PCI>> resolved overnight.  Hemoglobin and renal creatinine are stable.  Ambulated well with cardiac rehab.  He will continue Plavix and Eliquis.  Continue beta-blocker and statin.  2.  Hyperlipidemia 01/26/2021: VLDL 21 11/22/2021: Cholesterol, Total 181; HDL 65; LDL Chol Calc (NIH) 95; Triglycerides 119  -Recently increase Crestor to 40 mg daily -Consider repeat lab in few weeks.  If LDL above guideline, consider lipid clinic evaluation.  3.  Chronic combined CHF -LVEDP 15 mmHg by cardiac cath.  Appears euvolemic.  We will continue home bisoprolol, Wilder Glade, Entresto and spironolactone.   4.  Alcohol abuse 5.  Tobacco abuse -Recommended cessation.  Seems poor insight.  The patient been seen by Dr. Irish Lack today and deemed ready for discharge home. All follow-up appointments have been scheduled. Discharge medications are  listed  below.    Did the patient have an acute coronary syndrome (MI, NSTEMI, STEMI, etc) this admission?:  No                               Did the patient have a percutaneous coronary intervention (stent / angioplasty)?:  Yes.     Cath/PCI Registry Performance & Quality Measures: Aspirin prescribed? - Yes ADP Receptor Inhibitor (Plavix/Clopidogrel, Brilinta/Ticagrelor or Effient/Prasugrel) prescribed (includes medically managed patients)? - Yes High Intensity Statin (Lipitor 40-'80mg'$  or Crestor 20-'40mg'$ ) prescribed? - Yes For EF <40%, was ACEI/ARB prescribed? - Yes For EF <40%, Aldosterone Antagonist (Spironolactone or Eplerenone) prescribed? - Yes Cardiac Rehab Phase II ordered? - Yes    Discharge Vitals Blood pressure (!) 162/91, pulse (!) 55, temperature 97.8 F (36.6 C), temperature source Oral, resp. rate 18, height 6' (1.829 m), weight 93 kg, SpO2 99 %.  Filed Weights   12/24/21 0714  Weight: 93 kg   Physical Exam Constitutional:      Appearance: Normal appearance.  HENT:     Head: Normocephalic.     Nose: Nose normal.  Eyes:     Extraocular Movements: Extraocular movements intact.     Pupils: Pupils are equal, round, and reactive to light.  Cardiovascular:     Rate and Rhythm: Normal rate and regular rhythm.     Comments: Right radial cath site without hematoma Pulmonary:     Effort: Pulmonary effort is normal.     Breath sounds: Normal breath sounds.  Abdominal:     General: Abdomen is flat.     Palpations: Abdomen is soft.  Musculoskeletal:        General: Normal range of motion.     Cervical back: Normal range of motion and neck supple.  Skin:    General: Skin is warm and dry.  Neurological:     General: No focal deficit present.     Mental Status: He is alert and oriented to person, place, and time.  Psychiatric:        Mood and Affect: Mood normal.        Behavior: Behavior normal.    Labs & Radiologic Studies    CBC Recent Labs    12/25/21 0614   WBC 8.3  HGB 15.8  HCT 46.1  MCV 97.5  PLT 967*   Basic Metabolic Panel Recent Labs    12/25/21 0614  NA 138  K 3.8  CL 109  CO2 23  GLUCOSE 98  BUN 14  CREATININE 1.07  CALCIUM 8.7*   _____________  CARDIAC CATHETERIZATION  Result Date: 12/24/2021 Conclusions: Severe single-vessel coronary artery disease with 80-90% mid RCA stenosis.  There is mild-moderate, nonobstructive disease involving the left coronary artery. Low normal left ventricular systolic function (LVEF 89-38%) with upper normal filling pressure (LVEDP 15 mmHg). Successful PCI to mid RCA using Synergy 2.75 x 20 mm drug-eluting stent (postdilated to 3.3 mm) with 0% residual stenosis and TIMI-3 flow. Recommendations: Admit for overnight observation given lack of transportation to accommodate same-day discharge post PCI. If no evidence of bleeding or vascular injury, restart apixaban tomorrow morning.  Recommend concurrent apixaban and clopidogrel for at least 6 months.  Aspirin can be held after discharge if the patient remains on apixaban and clopidogrel. Continue secondary prevention of coronary artery disease and goal-directed medical therapy for management of chronic HFrEF with recovered ejection fraction. Nelva Bush, MD [facility]  CT [facility] Sheela Stack  COR W/SCORE W/CA W/CM &/OR WO/CM  Addendum Date: 12/06/2021   ADDENDUM REPORT: 12/06/2021 12:26 ADDENDUM: Addendum: After further review of the mid RCA, the lesion appears to be likely moderate to severe -will send for FFRCt. Electronically Signed   By: Berniece Salines D.O.   On: 12/06/2021 12:26   Addendum Date: 12/04/2021   ADDENDUM REPORT: 12/04/2021 16:20 EXAM: OVER-READ INTERPRETATION  CT CHEST The following report is an over-read performed by radiologist Dr. Jason Nest [facility] on 12/04/2021. This over-read does not include interpretation of cardiac or coronary anatomy or pathology. The coronary CTA interpretation by the cardiologist is  attached. COMPARISON:  None. FINDINGS: Vascular: No significant extracardiac vascular findings. Mediastinum/Nodes: No lymphadenopathy Lungs/Pleura: No focal airspace disease, suspicious pulmonary nodule, pleural effusion, or pneumothorax within the field of view. Upper Abdomen: There are multiple cysts in the liver. No acute findings. Musculoskeletal: Multiple old right lower rib injuries. No acute osseous abnormality. No suspicious bone lesion. IMPRESSION: No acute or significant incidental extracardiac findings in the chest. Electronically Signed   By: Maurine Simmering M.D.   On: 12/04/2021 16:20   Result Date: 12/06/2021 CLINICAL DATA:  This is a 61 year old male with chest pain. EXAM: Cardiac/Coronary  CTA TECHNIQUE: The patient was scanned on a [employer]. FINDINGS: A 100 kV prospective scan was triggered in the descending thoracic aorta at 111 HU's. Axial non-contrast 3 mm slices were carried out through the heart. The data set was analyzed on a dedicated work station and scored using the Lyman. Gantry rotation speed was 250 msecs and collimation was .6 mm. No beta blockade and 0.8 mg of sl NTG was given. The 3D data set was reconstructed in 5% intervals of the 67-82 % of the R-R cycle. Diastolic phases were analyzed on a dedicated work station using MPR, MIP and VRT modes. The patient received 80 cc of contrast. Aorta: Normal size.  No calcifications.  No dissection. Aortic Valve:  Trileaflet.  No calcifications. Coronary Arteries:  Normal coronary origin.  Right dominance. RCA is a large dominant artery that gives rise to PDA and PLA. There is mild (25-49%) focal soft plaque in the mid RCA. The proximal and distal RCA with no plaques. Left main is a large artery that gives rise to LAD, intermedius ramus and LCX arteries. LAD is a large vessel. There is mild calcified plaque in the proximal LAD. At the proximal-mid LAD there is a focal mild soft plaque. The mid-distal and distal LAD with no  plaques. Ramus - Mild focal calcification in the proximal portion of the vessel. The mid and distal portion with no plaques. LCX is a non-dominant artery that gives rise to one large OM1 branch. There is mild mixed plaque in the proximal LCX. The mid to distal portion of the vessel with focal mild calcified plaque. Coronary Calcium Score: Left main: 0 Left anterior descending artery: 46.9 Left circumflex artery: 10.9 Right coronary artery: 7.32 Total: 65.1 Percentile: 67 Other findings: Normal pulmonary vein drainage into the left atrium. Normal left atrial appendage without a thrombus. Normal size of the pulmonary artery. IMPRESSION: 1. Coronary calcium score of 65.1. This was 73 percentile for age and sex matched control. 2. Normal coronary origin with right dominance. 3. CAD-RADS 2. Mild non-obstructive CAD (25-49%). Consider non-atherosclerotic causes of chest pain. Consider preventive therapy and risk factor modification. The noncardiac portion of this study will be interpreted in separate report by the radiologist. Electronically Signed: By: Berniece Salines  D.O. On: 12/04/2021 16:03   CT CORONARY FRACTIONAL FLOW RESERVE DATA PREP  Result Date: 12/06/2021 EXAM: CT FFR ANALYSIS CLINICAL DATA:  Coronary artery disease (CAD) (Ped 0-17y) FINDINGS: FFRct analysis was performed on the original cardiac CT angiogram dataset. Diagrammatic representation of the FFRct analysis is provided in a separate PDF document in PACS. This dictation was created using the PDF document and an interactive 3D model of the results. 3D model is not available in the EMR/PACS. Normal FFR range is >0.80. Indeterminate (grey) zone is 0.76-0.80. 1. Left Main: FFR = 0.99 2. LAD: Proximal FFR = 0.98, mid FFR = 0.92, distal FFR = 0.80 3. LCX: Proximal FFR = 0.94, distal FFR = 0.85 4. RCA: Proximal FFR = 0.98, mid FFR = 0.62, distal FFR = 0.62 IMPRESSION: 1.  CT FFR analysis showed flow limiting lesion in the mid-RCA. RECOMMENDATIONS: Recommend  Cardiac Catheterization. Electronically Signed   By: Berniece Salines D.O.   On: 12/06/2021 12:42    Disposition   Pt is being discharged home today in good condition.  Follow-up Plans & Appointments     Follow-up Information     [name], MD Follow up on 01/16/2022.   Specialties: Cardiology, Radiology Why: '@2'$ :40pm for follow up on cath Contact information: [address] [place] [address] [phone]                Discharge Instructions     AMB Referral to Advanced Lipid Disorders Clinic   Complete by: As directed    Internal Lipid Clinic Referral Scheduling  Internal lipid clinic referrals are providers within [facility], who wish to refer established patients for routine management (help in starting PCSK9 inhibitor therapy) or advanced therapies.  Internal MD referral criteria:              1. All patients with LDL>190 mg/dL  2. All patients with Triglycerides >500 mg/dL  3. Patients with suspected or confirmed heterozygous familial hyperlipidemia (HeFH) or homozygous familial hyperlipidemia (HoFH)  4. Patients with family history of suspicious for genetic dyslipidemia desiring genetic testing  5. Patients refractory to standard guideline based therapy  6. Patients with statin intolerance (failed 2 statins, one of which must be a high potency statin)  7. Patients who the provider desires to be seen by MD   Internal PharmD referral criteria:   1. Follow-up patients for medication management  2. Follow-up for compliance monitoring  3. Patients for drug education  4. Patients with statin intolerance  5. PCSK9 inhibitor education and prior authorization approvals  6. Patients with triglycerides <500 mg/dL  External Lipid Clinic Referral  External lipid clinic referrals are for providers outside of [facility], considered new clinic patients - automatically routed to MD schedule   AMB Referral to Cardiac Rehabilitation - Phase II    Complete by: As directed    Diagnosis: Coronary Stents   After initial evaluation and assessments completed: Virtual Based Care may be provided alone or in conjunction with Phase 2 Cardiac Rehab based on patient barriers.: Yes   Diet - low sodium heart healthy   Complete by: As directed    Discharge instructions   Complete by: As directed    No driving for 48 hours. No lifting over 5 lbs for 1 week. No sexual activity for 1 week. You may return to work on 12/31/21. Keep procedure site clean & dry. If you notice increased pain, swelling, bleeding or pus, call/return!  You may shower, but no  soaking baths/hot tubs/pools for 1 week.   Increase activity slowly   Complete by: As directed        Discharge Medications   Allergies as of 12/25/2021       Reactions   Beta Adrenergic Blockers Other (See Comments)   Scrotal edema with metoprolol and carvedilol   Tape Rash   Plastic tape        Medication List     TAKE these medications    acetaminophen 325 MG tablet Commonly known as: TYLENOL Take 650 mg by mouth every 6 (six) hours as needed for moderate pain or headache.   bisoprolol 5 MG tablet Commonly known as: ZEBETA Take 1 tablet (5 mg total) by mouth daily. What changed: when to take this   cetirizine 10 MG tablet Commonly known as: ZYRTEC Take 10 mg by mouth as needed for allergies.   clopidogrel 75 MG tablet Commonly known as: PLAVIX Take 75 mg by mouth in the morning.   dapagliflozin propanediol 10 MG Tabs tablet Commonly known as: Farxiga Take 1 tablet (10 mg total) by mouth daily before breakfast. NEEDS FOLLOW UP APPOINTMENT FOR ANYMORE REFILLS   Eliquis 5 MG Tabs tablet Generic drug: apixaban TAKE 1 TABLET BY MOUTH TWICE A DAY What changed: how much to take   Entresto 97-103 MG Generic drug: sacubitril-valsartan TAKE 1 TABLET BY MOUTH TWICE A DAY   latanoprost 0.005 % ophthalmic solution Commonly known as: XALATAN Place 1 drop into both eyes at  bedtime.   Nicotine 10 MG/ML Soln 1-2 puffs as needed   nitroGLYCERIN 0.4 MG SL tablet Commonly known as: NITROSTAT Place 1 tablet (0.4 mg total) under the tongue every 5 (five) minutes as needed.   PEPCID COMPLETE PO Take 1 tablet by mouth daily as needed (acid reflux).   Rewetting Drops Soln Apply 1-2 drops to eye daily as needed (dry/irritated eyes.).   rosuvastatin 40 MG tablet Commonly known as: CRESTOR Take 1 tablet (40 mg total) by mouth daily. What changed: when to take this   spironolactone 25 MG tablet Commonly known as: ALDACTONE Take 1 tablet (25 mg total) by mouth daily.         Outstanding Labs/Studies   Lipid panel and LFTs in few weeks.  Duration of Discharge Encounter   Greater than 30 minutes including physician time.  SignedLeanor Kail, PA 12/25/2021, 9:47 AM  I have examined the patient and reviewed assessment and plan and discussed with patient.  Agree with above as stated.    GEN: Well nourished, well developed, in no acute distress  HEENT: normal  Neck: no JVD, carotid bruits, or masses Cardiac: RRR; no murmurs, rubs, or gallops,no edema  Respiratory:  clear to auscultation bilaterally, normal work of breathing GI: soft, nontender, nondistended,  MS: no deformity or atrophy ; right radial site stable.  No hematoma.  Palpable pulse. Skin: warm and dry, no rash Neuro:  Strength and sensation are intact Psych: euthymic mood, full affect   I stressed the importance of dual antiplatelet therapy as well as medical therapy for LV dysfunction and hyperlipidemia.  I encouraged him to participate in cardiac rehab.  He did not go home yesterday due to lack of transportation.  He is stable for discharge at this time.  Watch for bleeding due to requiring clopidogrel and ELiquis use.   Larae Grooms

## 2021-12-26 LAB — LIPOPROTEIN A (LPA): Lipoprotein (a): 44.5 nmol/L — ABNORMAL HIGH (ref <75.0)

## 2021-12-27 ENCOUNTER — Other Ambulatory Visit (HOSPITAL_COMMUNITY): Payer: Self-pay | Admitting: Cardiology

## 2022-01-01 ENCOUNTER — Ambulatory Visit: Payer: 59

## 2022-01-01 ENCOUNTER — Telehealth (HOSPITAL_COMMUNITY): Payer: Self-pay

## 2022-01-01 ENCOUNTER — Ambulatory Visit (INDEPENDENT_AMBULATORY_CARE_PROVIDER_SITE_OTHER): Payer: 59

## 2022-01-01 DIAGNOSIS — Z0000 Encounter for general adult medical examination without abnormal findings: Secondary | ICD-10-CM

## 2022-01-01 NOTE — Progress Notes (Signed)
Appointment Outcome: Completed, Session #: 2 Start time: 1:30pm   End time: 2:05pm   Total Mins: 35 minutes  AGREEMENTS SECTION    Overall Goal(s): Smoking cessation - Quit Date (TBD) Stress management                                                   Agreement/Action Steps:  Smoking Cessation Practice mini quits Smoke 1/2 cigarette at a time Switch to short cigarettes Monitor smoking behavior Track how many cigarettes are smoked per day   Stress management Continue to read [facility] deep breathing Write in journal 2-3 evenings/week   Progress Notes:  Patient reported that he was able to maintain smoking 1/2 cigarette during week one. Patient stated that he smoked half a pack or less per day during this time. Patient shared that he did not smoke between 5/22 - 5/24 after having a procedure done. Patient stated that he resumed smoking last Thursday due to attending his friend's funeral.   Patient shared that he switched back to short cigarettes. Patient stated that he has been smoking half a pack of cigarettes daily due to the stress of recent events. Patient shared that he went back to smoking a whole cigarette last Thursday. Patient mentioned that his smoking increased due to trying not to think about things.   Patient stated that due to recovery, he has been confined to the house and has not been able to do much physical activity including his gardening in the past week. Patient stated that when he was able to work in his garden the week before, he found it to be peaceful and he was able to clear his mind.   Patient mentioned that he has tried to keep himself busy and extend his mini quits by reading and watching tv. Patient stated that the longest he has gone between smoking is between 4-5 hours. Patient has incorporated deep breathing as well. Patient shared that he did not write in his journal because he would forget at times and didn't want to write about what he was  dealing with because it would be triggering. Discussed with patient various ways that he could utilize his journal without writing about his stressors.   Patient rated his stress level 6/7 out of 10 compared to before due to trying to understand things more, trying not to let things stress him that he does not have control over, and being relieved some now that his procedure is completed. Patient has also identified a few family members and friends that he can utilize as his support system when necessary.   Patient reported that after the procedure he has been feeling run down. Asked patient if his smoking is adding to this feeling. Patient stated that it made him feel more disappointed when he does smoke, and he thinks to himself that he shouldn't smoke. Discussed with patient about implementing more positive self-talk to encourage himself through stressful moments or to encourage himself to extend his mini quits for a few minutes longer or to not smoke at all.   Patient stated that he feels he is now in a place that he can pay more attention to his smoking behavior and try to start back being active. Patient stated that he was referred for Cardiac Rehab and should be able to start that soon.  Indicators of Success and Accountability:  Patient stated that he feels he has handled stress better in the past two weeks than previously despite challenges. Readiness: Patient is in the action phase of smoking cessation and stress management. Strengths and Supports: Patient has family and friends as supports. Patient self-awareness has increased. Challenges and Barriers: Patient does not foresee any challenges implementing his action steps over the next two weeks.     Coaching Outcomes: Patient stated that he will be more attentive to his smoking behavior over the next two weeks.   Patient will challenge himself to cut back on smoking by aiming to smoke between 7-9 cigarettes per day. Patient will  rely on method he previously used by dividing his daily cigarette allowance between the days and evenings (e.g., 4 cigarettes during the day and 4 cigarettes per evening = 8 cigarettes).  Patient will return to smoking 1/2 cigarette at a time instead of a whole cigarette.   Patient is interested in writing in a journal but focusing on other topics rather than his stressors that can become triggers.  Confirmed that patient has been referred for Cardiac Rehab and will be scheduled after his follow up with Dr. Gardiner Rhyme on 6/14 once he is cleared.   Agreement/Action Steps:  Smoking Cessation Aim to smoke 7-9 cigarettes per day Practice mini quits for as long as possible Smoke 1/2 cigarette at a time Maintain smoking short cigarettes Monitor smoking behavior Track how many cigarettes smoked per day/What time   Stress management Read daily [facility] deep breathing Implement positive self-talk Write in journal 2-3 evenings/week Document smoking cessation journey or use as gratitude journal   Attempted: Fulfilled - Patient has practiced mini quits, switched to short cigarettes, monitored smoking behavior, and continued to read. Patient practiced deep breathing. Partial - Patient has smoked a whole cigarette on some occasions instead of smoking 1/2 cigarette at a time. Patient was only able to garden during the first week.  Not met - Patient did not write in journal over the past two weeks.

## 2022-01-01 NOTE — Telephone Encounter (Signed)
Pt called in regards to CR, pt stated he is interested. Explained scheduling process and went over insurance, patient verbalized understanding. Will contact patient for scheduling once f/u has been completed.

## 2022-01-14 ENCOUNTER — Ambulatory Visit (INDEPENDENT_AMBULATORY_CARE_PROVIDER_SITE_OTHER): Payer: 59 | Admitting: Cardiology

## 2022-01-14 ENCOUNTER — Encounter: Payer: Self-pay | Admitting: *Deleted

## 2022-01-14 ENCOUNTER — Encounter: Payer: Self-pay | Admitting: Cardiology

## 2022-01-14 VITALS — BP 180/110 | HR 70 | Ht 74.0 in | Wt 213.4 lb

## 2022-01-14 DIAGNOSIS — I480 Paroxysmal atrial fibrillation: Secondary | ICD-10-CM | POA: Diagnosis not present

## 2022-01-14 DIAGNOSIS — D6869 Other thrombophilia: Secondary | ICD-10-CM

## 2022-01-14 DIAGNOSIS — Z01812 Encounter for preprocedural laboratory examination: Secondary | ICD-10-CM | POA: Diagnosis not present

## 2022-01-14 DIAGNOSIS — I4819 Other persistent atrial fibrillation: Secondary | ICD-10-CM | POA: Diagnosis not present

## 2022-01-14 NOTE — Progress Notes (Signed)
Electrophysiology Office Note   Date:  01/14/2022   ID:  Christopher Gutierrez, DOB [birth year], MRN [id number]  PCP:  Christopher Nutting, DO  Cardiologist:  [place] Primary Electrophysiologist:  Christopher Mccloud Meredith Leeds, MD    Chief Complaint: AF   History of Present Illness: Christopher Gutierrez is a 61 y.o. male who is being seen today for the evaluation of AF at the request of Christopher Gutierrez*. Presenting today for electrophysiology evaluation.  He has a history significant for hypertension, tobacco abuse, alcohol abuse.  He presented to cardiology clinic with atrial fibrillation.  He was found to be volume overloaded and discussed admission for hospitalization with diuresis and TEE cardioversion but declined.  He had an echo that showed an ejection fraction of 20 to 25% with reduced RV function.  He had a TEE cardioversion 11/01/2020.  He followed up in heart failure clinic after his hospitalization.  He was started on Entresto, Aldactone, bisoprolol.  Echo 01/26/2021 showed an improvement in his ejection fraction 45 to 50%.  Coronary CT showed RCA obstructive disease.  He had a left heart catheterization and had stenting of his RCA.  Today, he denies symptoms of palpitations, chest pain, shortness of breath, orthopnea, PND, lower extremity edema, claudication, dizziness, presyncope, syncope, bleeding, or neurologic sequela. The patient is tolerating medications without difficulties.    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   Hypertension    Past Surgical History:  Procedure Laterality Date   ABDOMINAL AORTOGRAM W/LOWER EXTREMITY N/A 03/07/2021   Procedure: ABDOMINAL AORTOGRAM W/LOWER EXTREMITY;  Surgeon: Wellington Hampshire, MD;  Location: [facility];  Service: Cardiovascular;  Laterality: N/A;   CARDIOVERSION N/A 11/01/2020    Procedure: CARDIOVERSION;  Surgeon: Jerline Pain, MD;  Location: [facility];  Service: Cardiovascular;  Laterality: N/A;   CATARACT EXTRACTION Right    CORONARY STENT INTERVENTION N/A 12/24/2021   Procedure: CORONARY STENT INTERVENTION;  Surgeon: Nelva Bush, MD;  Location: [facility];  Service: Cardiovascular;  Laterality: N/A;   LEFT HEART CATH AND CORONARY ANGIOGRAPHY N/A 12/24/2021   Procedure: LEFT HEART CATH AND CORONARY ANGIOGRAPHY;  Surgeon: Nelva Bush, MD;  Location: [facility];  Service: Cardiovascular;  Laterality: N/A;   MOUTH SURGERY  2013   PERIPHERAL VASCULAR INTERVENTION Left 03/07/2021   Procedure: PERIPHERAL VASCULAR INTERVENTION;  Surgeon: Wellington Hampshire, MD;  Location: [facility];  Service: Cardiovascular;  Laterality: Left;  left SFA   TEE WITHOUT CARDIOVERSION N/A 11/01/2020   Procedure: TRANSESOPHAGEAL ECHOCARDIOGRAM (TEE);  Surgeon: Jerline Pain, MD;  Location[facility];  Service: Cardiovascular;  Laterality: N/A;     Current Outpatient Medications  Medication Sig Dispense Refill   acetaminophen (TYLENOL) 325 MG tablet Take 650 mg by mouth every 6 (six) hours as needed for moderate pain or headache.     apixaban (ELIQUIS) 5 MG TABS tablet TAKE 1 TABLET BY MOUTH TWICE A DAY (Patient taking differently: Take 5 mg by mouth 2 (two) times daily.) 180 tablet 1   bisoprolol (ZEBETA) 5 MG tablet Take 1 tablet (5 mg total) by mouth daily. (Patient taking differently: Take 5 mg by mouth every evening.) 90 tablet 3   cetirizine (ZYRTEC) 10 MG tablet Take 10 mg by mouth as needed for allergies.     clopidogrel (PLAVIX) 75 MG  tablet Take 75 mg by mouth in the morning.     dapagliflozin propanediol (FARXIGA) 10 MG TABS tablet Take 1 tablet (10 mg total) by mouth daily before breakfast. NEEDS FOLLOW UP APPOINTMENT FOR ANYMORE REFILLS 30 tablet 3   ENTRESTO 97-103 MG TAKE 1 TABLET BY MOUTH TWICE A DAY 60 tablet 6   Famotidine-Ca Carb-Mag  Hydrox (PEPCID COMPLETE PO) Take 1 tablet by mouth daily as needed (acid reflux).     latanoprost (XALATAN) 0.005 % ophthalmic solution Place 1 drop into both eyes at bedtime.     Nicotine 10 MG/ML SOLN 1-2 puffs as needed 10 mL 2   nitroGLYCERIN (NITROSTAT) 0.4 MG SL tablet Place 1 tablet (0.4 mg total) under the tongue every 5 (five) minutes as needed. 25 tablet 3   rosuvastatin (CRESTOR) 40 MG tablet Take 1 tablet (40 mg total) by mouth daily. (Patient taking differently: Take 40 mg by mouth every evening.) 90 tablet 0   Soft Lens Products (REWETTING DROPS) SOLN Apply 1-2 drops to eye daily as needed (dry/irritated eyes.).     spironolactone (ALDACTONE) 25 MG tablet Take 1 tablet (25 mg total) by mouth daily. 90 tablet 3   No current facility-administered medications for this visit.    Allergies:   Beta adrenergic blockers and Tape   Social History:  The patient  reports that he has been smoking cigarettes. He has a 37.50 pack-year smoking history. His smokeless tobacco use includes chew. He reports current alcohol use of about 24.0 standard drinks of alcohol per week. He reports current drug use. Frequency: 2.00 times per week. Drug: Marijuana.   Family History:  The patient's family history includes Birth defects in his paternal aunt; COPD in his maternal uncle; Gout in his brother; Hypertension in his brother; Stroke in his paternal aunt.    ROS:  Please see the history of present illness.   Otherwise, review of systems is positive for none.   All other systems are reviewed and negative.    PHYSICAL EXAM: VS:  BP (!) 180/110 (BP Location: Left Arm, Patient Position: Sitting, Cuff Size: Normal)   Pulse 70   Ht '6\' 2"'$  (1.88 m)   Wt 213 lb 6.4 oz (96.8 kg)   BMI 27.40 kg/m  , BMI Body mass index is 27.4 kg/m. GEN: Well nourished, well developed, in no acute distress  HEENT: normal  Neck: no JVD, carotid bruits, or masses Cardiac: RRR; no murmurs, rubs, or gallops,no edema   Respiratory:  clear to auscultation bilaterally, normal work of breathing GI: soft, nontender, nondistended, + BS MS: no deformity or atrophy  Skin: warm and dry Neuro:  Strength and sensation are intact Psych: euthymic mood, full affect  EKG:  EKG is ordered today. Personal review of the ekg ordered shows sinus rhythm, rate 75  Recent Labs: 01/26/2021: B Natriuretic Peptide 385.9 09/01/2021: ALT 32 10/11/2021: TSH 2.670 11/22/2021: Magnesium 2.3 12/25/2021: BUN 14; Creatinine, Ser 1.07; Hemoglobin 15.8; Platelets 135; Potassium 3.8; Sodium 138    Lipid Panel     Component Value Date/Time   CHOL 181 11/22/2021 1458   TRIG 119 11/22/2021 1458   HDL 65 11/22/2021 1458   CHOLHDL 2.8 11/22/2021 1458   CHOLHDL 3.3 01/26/2021 1213   VLDL 21 01/26/2021 1213   LDLCALC 95 11/22/2021 1458     Wt Readings from Last 3 Encounters:  01/14/22 213 lb 6.4 oz (96.8 kg)  12/24/21 205 lb (93 kg)  12/13/21 211 lb 9.6 oz (96 kg)  Other studies Reviewed: Additional studies/ records that were reviewed today include: [facility] 12/24/21  Review of the above records today demonstrates:  Severe single-vessel coronary artery disease with 80-90% mid RCA stenosis.  There is mild-moderate, nonobstructive disease involving the left coronary artery. Low normal left ventricular systolic function (LVEF 09-73%) with upper normal filling pressure (LVEDP 15 mmHg). Successful PCI to mid RCA using Synergy 2.75 x 20 mm drug-eluting stent (postdilated to 3.3 mm) with 0% residual stenosis and TIMI-3 flow.  TTE 01/26/21  1. Compared with echo 12/3297, systolic function has improved.   2. Left ventricular ejection fraction, by estimation, is 45 to 50%. The  left ventricle has mildly decreased function. The left ventricle  demonstrates global hypokinesis. There is moderate concentric left  ventricular hypertrophy. Left ventricular  diastolic parameters are consistent with Grade I diastolic dysfunction  (impaired  relaxation).   3. Right ventricular systolic function is normal. The right ventricular  size is normal.   4. The mitral valve is normal in structure. Trivial mitral valve  regurgitation. No evidence of mitral stenosis.   5. The aortic valve is tricuspid. Aortic valve regurgitation is not  visualized. No aortic stenosis is present.   6. The inferior vena cava is normal in size with greater than 50%  respiratory variability, suggesting right atrial pressure of 3 mmHg.   ASSESSMENT AND PLAN:  1.  Persistent atrial fibrillation / flutter: CHA2DS2-VASc of 3.  Currently on Eliquis.  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  Due to that is imperative to keep him in normal rhythm.  Like to avoid antiarrhythmic medications.  Due to that, we Malayah Demuro plan for ablation.  Risk and benefits of been discussed.  He understands these risks and is agreed to the procedure.  He is currently on disability.  His disability Kajol Crispen run out in August.  We Betzabeth Derringer touch base with him in August to check on the status of insurance.  Risk, benefits, and alternatives to EP study and radiofrequency ablation for afib were also discussed in detail today. These risks include but are not limited to stroke, bleeding, vascular damage, tamponade, perforation, damage to the esophagus, lungs, and other structures, pulmonary vein stenosis, worsening renal function, and death. The patient understands these risk and wishes to proceed.  We Tonilynn Bieker therefore proceed with catheter ablation at the next available time.  Carto, ICE, anesthesia are requested for the procedure.  Marcelline Temkin also obtain CT PV protocol prior to the procedure to exclude LAA thrombus and further evaluate atrial anatomy.   2.  Coronary artery disease: Status post RCA stent.  No current chest pain.  3.  Chronic combined systolic and diastolic heart failure: Currently on optimal medical therapy.  Ejection fraction 45 to 50%.  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  Plan per primary cardiology.  4.  Peripheral arterial disease: Has distal SFA occlusion.  Follows with Dr. Velva Harman.  Is status post angioplasty and stent of the left SFA to popliteal artery.  5.  Secondary hypercoagulable state: Currently on Eliquis for atrial fibrillation as above.  Case discussed with primary cardiology  Current medicines are reviewed at length with the patient today.   The patient does not have concerns regarding his medicines.  The following changes were made today:  none  Labs/ tests ordered today include:  Orders Placed This Encounter  Procedures   CT CARDIAC MORPH/PULM VEIN W/CM&W/O CA SCORE   Basic metabolic panel   CBC  EKG 12-Lead     Disposition:   FU with Daegon Deiss 3 months  Signed, Rylynn Schoneman Meredith Leeds, MD  01/14/2022 10:37 AM     [facility] [address] [place] [address] [phone] (office) [phone] (fax)

## 2022-01-14 NOTE — Patient Instructions (Signed)
Medication Instructions:  Your physician recommends that you continue on your current medications as directed. Please refer to the Current Medication list given to you today.  *If you need a refill on your cardiac medications before your next appointment, please call your pharmacy*   Lab Work: Pre procedure labs - see procedure instruction letter:  BMP & CBC  If you have labs (blood work) drawn today and your tests are completely normal, you will receive your results only by: MyChart Message (if you have MyChart) OR A paper copy in the mail If you have any lab test that is abnormal or we need to change your treatment, we will call you to review the results.   Testing/Procedures: Your physician has requested that you have cardiac CT within 7 days PRIOR to your ablation. Cardiac computed tomography (CT) is a painless test that uses an x-ray machine to take clear, detailed pictures of your heart.  Please follow instruction below located under "other instructions". You will get a call from our office to schedule the date for this test.  Your physician has recommended that you have an ablation. Catheter ablation is a medical procedure used to treat some cardiac arrhythmias (irregular heartbeats). During catheter ablation, a long, thin, flexible tube is put into a blood vessel in your groin (upper thigh), or neck. This tube is called an ablation catheter. It is then guided to your heart through the blood vessel. Radio frequency waves destroy small areas of heart tissue where abnormal heartbeats may cause an arrhythmia to start. Please follow instruction letter given to you today.   Follow-Up: At [facility], you and your health needs are our priority.  As part of our continuing mission to provide you with exceptional heart care, we have created designated Provider Care Teams.  These Care Teams include your primary Cardiologist (physician) and Advanced Practice Providers (APPs -  Physician  Assistants and Nurse Practitioners) who all work together to provide you with the care you need, when you need it.  Your next appointment:   1 month(s) after your ablation  The format for your next appointment:   In Person  Provider:   AFib clinic   Thank you for choosing [facility]!!   Tobie Hellen, RN [phone]    Other Instructions  Cardiac Ablation Cardiac ablation is a procedure to destroy (ablate) some heart tissue that is sending bad signals. These bad signals cause problems in heart rhythm. The heart has many areas that make these signals. If there are problems in these areas, they can make the heart beat in a way that is not normal. Destroying some tissues can help make the heart rhythm normal. Tell your doctor about: Any allergies you have. All medicines you are taking. These include vitamins, herbs, eye drops, creams, and over-the-counter medicines. Any problems you or family members have had with medicines that make you fall asleep (anesthetics). Any blood disorders you have. Any surgeries you have had. Any medical conditions you have, such as kidney failure. Whether you are pregnant or may be pregnant. What are the risks? This is a safe procedure. But problems may occur, including: Infection. Bruising and bleeding. Bleeding into the chest. Stroke or blood clots. Damage to nearby areas of your body. Allergies to medicines or dyes. The need for a pacemaker if the normal system is damaged. Failure of the procedure to treat the problem. What happens before the procedure? Medicines Ask your doctor about: Changing or stopping your normal medicines. This is   important. Taking aspirin and ibuprofen. Do not take these medicines unless your doctor tells you to take them. Taking other medicines, vitamins, herbs, and supplements. General instructions Follow instructions from your doctor about what you cannot eat or drink. Plan to have someone take you home  from the hospital or clinic. If you will be going home right after the procedure, plan to have someone with you for 24 hours. Ask your doctor what steps will be taken to prevent infection. What happens during the procedure?  An IV tube will be put into one of your veins. You will be given a medicine to help you relax. The skin on your neck or groin will be numbed. A cut (incision) will be made in your neck or groin. A needle will be put through your cut and into a large vein. A tube (catheter) will be put into the needle. The tube will be moved to your heart. Dye may be put through the tube. This helps your doctor see your heart. Small devices (electrodes) on the tube will send out signals. A type of energy will be used to destroy some heart tissue. The tube will be taken out. Pressure will be held on your cut. This helps stop bleeding. A bandage will be put over your cut. The exact procedure may vary among doctors and hospitals. What happens after the procedure? You will be watched until you leave the hospital or clinic. This includes checking your heart rate, breathing rate, oxygen, and blood pressure. Your cut will be watched for bleeding. You will need to lie still for a few hours. Do not drive for 24 hours or as long as your doctor tells you. Summary Cardiac ablation is a procedure to destroy some heart tissue. This is done to treat heart rhythm problems. Tell your doctor about any medical conditions you may have. Tell him or her about all medicines you are taking to treat them. This is a safe procedure. But problems may occur. These include infection, bruising, bleeding, and damage to nearby areas of your body. Follow what your doctor tells you about food and drink. You may also be told to change or stop some of your medicines. After the procedure, do not drive for 24 hours or as long as your doctor tells you. This information is not intended to replace advice given to you by your  health care provider. Make sure you discuss any questions you have with your health care provider. Document Revised: 06/24/2019 Document Reviewed: 06/24/2019 Elsevier Patient Education  2023 Elsevier Inc.  

## 2022-01-16 ENCOUNTER — Ambulatory Visit (INDEPENDENT_AMBULATORY_CARE_PROVIDER_SITE_OTHER): Payer: 59

## 2022-01-16 ENCOUNTER — Ambulatory Visit (INDEPENDENT_AMBULATORY_CARE_PROVIDER_SITE_OTHER): Payer: 59 | Admitting: Cardiology

## 2022-01-16 ENCOUNTER — Encounter: Payer: Self-pay | Admitting: Cardiology

## 2022-01-16 VITALS — BP 168/102 | HR 79 | Ht 74.0 in | Wt 212.2 lb

## 2022-01-16 DIAGNOSIS — Z0000 Encounter for general adult medical examination without abnormal findings: Secondary | ICD-10-CM

## 2022-01-16 DIAGNOSIS — I2510 Atherosclerotic heart disease of native coronary artery without angina pectoris: Secondary | ICD-10-CM | POA: Diagnosis not present

## 2022-01-16 DIAGNOSIS — I5042 Chronic combined systolic (congestive) and diastolic (congestive) heart failure: Secondary | ICD-10-CM | POA: Diagnosis not present

## 2022-01-16 DIAGNOSIS — I4819 Other persistent atrial fibrillation: Secondary | ICD-10-CM | POA: Diagnosis not present

## 2022-01-16 DIAGNOSIS — E785 Hyperlipidemia, unspecified: Secondary | ICD-10-CM | POA: Diagnosis not present

## 2022-01-16 DIAGNOSIS — I10 Essential (primary) hypertension: Secondary | ICD-10-CM

## 2022-01-16 MED ORDER — BISOPROLOL FUMARATE 10 MG PO TABS: 10.0000 mg | ORAL_TABLET | Freq: Every day | ORAL | 3 refills | Status: DC

## 2022-01-16 NOTE — Patient Instructions (Signed)
Medication Instructions:  INCREASE bisoprolol to 10 mg daily  *If you need a refill on your cardiac medications before your next appointment, please call your pharmacy*   Lab Work: BMET, Lipid, Mag today  If you have labs (blood work) drawn today and your tests are completely normal, you will receive your results only by: [place] (if you have MyChart) OR A paper copy in the mail If you have any lab test that is abnormal or we need to change your treatment, we will call you to review the results.  Follow-Up: At [facility], you and your health needs are our priority.  As part of our continuing mission to provide you with exceptional heart care, we have created designated Provider Care Teams.  These Care Teams include your primary Cardiologist (physician) and Advanced Practice Providers (APPs -  Physician Assistants and Nurse Practitioners) who all work together to provide you with the care you need, when you need it.  We recommend signing up for the patient portal called "MyChart".  Sign up information is provided on this After Visit Summary.  MyChart is used to connect with patients for Virtual Visits (Telemedicine).  Patients are able to view lab/test results, encounter notes, upcoming appointments, etc.  Non-urgent messages can be sent to your provider as well.   To learn more about what you can do with MyChart, go to [email or web address].    Your next appointment:   2 weeks with pharmacist --bring blood pressure cuff and blood pressure log with you  3 months with Dr. Gardiner Rhyme  Other Instructions Please check your blood pressure at home twice daily, write it down.  Bring log and blood pressure cuff to appointment with pharmacist  Important Information About Sugar

## 2022-01-16 NOTE — Progress Notes (Signed)
Cardiology Office Note:    Date:  01/21/2022   ID:  Christopher Gutierrez, DOB 01-Sep-1960, MRN [id number]  PCP:  [name], DO  Cardiologist:  [name], MD  Electrophysiologist:  Constance Haw, MD   Referring MD: [name], DO   Chief Complaint  Patient presents with   Congestive Heart Failure    History of Present Illness:    Christopher Gutierrez is a 61 y.o. male with a hx of HTN, tobacco use, alcohol use who presents for follow-up.  He was referred by Dr. [name] for evaluation of atrial fibrillation.  At initial clinic visit on 10/27/20, he appeared hypervolemic on exam and was in A. fib with rates up to 160s.  Discussed admission to the hospital for diuresis, rate control of his A. fib and eventual TEE/DCCV, but he declined.  At follow-up visit on 10/30/2020 he appeared more volume overloaded on direct admission was arranged.  Echo showed EF 20 to 25%, reduced RV function.  He had good response to IV diuretics and underwent successful TEE/DCCV on 11/01/2020 with restoration of sinus rhythm.  Initially followed with Dr. [name] in advanced heart failure after his hospitalization.  He was started on Entresto, spironolactone, and bisoprolol.  Repeat echocardiogram 01/26/2021 showed significant improvement in EF to 45 to 50%, and normal RV function.  Coronary CTA was done on 12/04/2021, which showed obstructive CAD in mid RCA.  LHC 12/24/2021 showed severe mid RCA stenosis status post DES.  Since last clinic visit, he reports has been doing okay.  No chest pain since his cath.  Denies any dyspnea, lightheadedness, syncope, lower extremity edema, or palpitations.  Not checking BP at home.  Continues to smoke 0.5 packs/day, but plans to quit next week.  Denies any bleeding issues.   Wt Readings from Last 3 Encounters:  01/16/22 212 lb 3.2 oz (96.3 kg)  01/14/22 213 lb 6.4 oz (96.8 kg)  12/24/21 205 lb (93 kg)   BP Readings from Last 3 Encounters:  01/16/22 (!)  168/102  01/14/22 (!) 180/110  12/25/21 (!) 162/91     Past Medical History:  Diagnosis Date   Atrial fibrillation ([place]) 10/24/2020   Atrial fibrillation and flutter ([place])    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   Hypertension     Past Surgical History:  Procedure Laterality Date   ABDOMINAL AORTOGRAM W/LOWER EXTREMITY N/A 03/07/2021   Procedure: ABDOMINAL AORTOGRAM W/LOWER EXTREMITY;  Surgeon: Wellington Hampshire, MD;  Location: [facility];  Service: Cardiovascular;  Laterality: N/A;   CARDIOVERSION N/A 11/01/2020   Procedure: CARDIOVERSION;  Surgeon: Jerline Pain, MD;  Location: [facility];  Service: Cardiovascular;  Laterality: N/A;   CATARACT EXTRACTION Right    CORONARY STENT INTERVENTION N/A 12/24/2021   Procedure: CORONARY STENT INTERVENTION;  Surgeon: Nelva Bush, MD;  Location: [facility];  Service: Cardiovascular;  Laterality: N/A;   LEFT HEART CATH AND CORONARY ANGIOGRAPHY N/A 12/24/2021   Procedure: LEFT HEART CATH AND CORONARY ANGIOGRAPHY;  Surgeon: Nelva Bush, MD;  Location: [facility];  Service: Cardiovascular;  Laterality: N/A;   MOUTH SURGERY  2013   PERIPHERAL VASCULAR INTERVENTION Left 03/07/2021   Procedure: PERIPHERAL VASCULAR INTERVENTION;  Surgeon: Wellington Hampshire, MD;  Location: [facility];  Service: Cardiovascular;  Laterality: Left;  left SFA   TEE WITHOUT CARDIOVERSION N/A 11/01/2020   Procedure: TRANSESOPHAGEAL ECHOCARDIOGRAM (TEE);  Surgeon: Jerline Pain, MD;  Location:  [facility];  Service: Cardiovascular;  Laterality: N/A;    Current Medications: Current Meds  Medication Sig   acetaminophen (TYLENOL) 325 MG tablet Take 650 mg by mouth every 6 (six) hours as needed for moderate pain or headache.   clopidogrel (PLAVIX) 75 MG tablet Take 75 mg by mouth in the morning.   dapagliflozin propanediol (FARXIGA) 10 MG TABS tablet Take 1 tablet (10 mg total) by mouth daily before breakfast.  NEEDS FOLLOW UP APPOINTMENT FOR ANYMORE REFILLS   ENTRESTO 97-103 MG TAKE 1 TABLET BY MOUTH TWICE A DAY   Famotidine-Ca Carb-Mag Hydrox (PEPCID COMPLETE PO) Take 1 tablet by mouth daily as needed (acid reflux).   rosuvastatin (CRESTOR) 40 MG tablet Take 1 tablet (40 mg total) by mouth daily. (Patient taking differently: Take 40 mg by mouth every evening.)   Soft Lens Products (REWETTING DROPS) SOLN Apply 1-2 drops to eye daily as needed (dry/irritated eyes.).   spironolactone (ALDACTONE) 25 MG tablet Take 1 tablet (25 mg total) by mouth daily.   [DISCONTINUED] apixaban (ELIQUIS) 5 MG TABS tablet TAKE 1 TABLET BY MOUTH TWICE A DAY (Patient taking differently: Take 5 mg by mouth 2 (two) times daily.)   [DISCONTINUED] bisoprolol (ZEBETA) 5 MG tablet Take 1 tablet (5 mg total) by mouth daily. (Patient taking differently: Take 5 mg by mouth every evening.)     Allergies:   Beta adrenergic blockers and Tape   Social History   Socioeconomic History   Marital status: Divorced    Spouse name: Not on file   Number of children: 1   Years of education: Not on file   Highest education level: Master's degree (e.g., MA, MS, MEng, MEd, MSW, MBA)  Occupational History   Not on file  Tobacco Use   Smoking status: Some Days    Packs/day: 1.50    Years: 25.00    Total pack years: 37.50    Types: Cigarettes   Smokeless tobacco: Current    Types: Chew   Tobacco comments:    05/17/21 Ronalee Belts has cut down smoking to 1/2 to 1 ppd.  Not ready to quit.    11/22/2021 Smokes some days  Vaping Use   Vaping Use: Never used  Substance and Sexual Activity   Alcohol use: Yes    Alcohol/week: 24.0 standard drinks of alcohol    Types: 24 Cans of beer per week    Comment: 6 cans every other day   Drug use: Yes    Frequency: 2.0 times per week    Types: Marijuana    Comment: he took Marijuana 2 weeks ago   Sexual activity: Yes    Partners: Female  Other Topics Concern   Not on file  Social History Narrative    Not on file   Social Determinants of Health   Financial Resource Strain: Low Risk  (11/01/2020)   Overall Financial Resource Strain (CARDIA)    Difficulty of Paying Living Expenses: Not hard at all  Food Insecurity: No Food Insecurity (11/01/2020)   Hunger Vital Sign    Worried About Running Out of Food in the Last Year: Never true    Ran Out of Food in the Last Year: Never true  Transportation Needs: No Transportation Needs (11/01/2020)   PRAPARE - Hydrologist (Medical): No    Lack of Transportation (Non-Medical): No  Physical Activity: Not on file  Stress: Not on file  Social Connections: Not on file     Family History:  The patient's family history includes Birth defects in his paternal aunt; COPD in his maternal uncle; Gout in his brother; Hypertension in his brother; Stroke in his paternal aunt.  ROS:   Please see the history of present illness.     All other systems reviewed and are negative.  EKGs/Labs/Other Studies Reviewed:    The following studies were reviewed today:   EKG:   12/13/2021: Sinus rhythm, rate 71, PVC, poor R wave progression Recent Labs: 01/26/2021: B Natriuretic Peptide 385.9 09/01/2021: ALT 32 10/11/2021: TSH 2.670 12/25/2021: Hemoglobin 15.8; Platelets 135 01/16/2022: BUN 15; Creatinine, Ser 1.18; Magnesium 2.2; Potassium 5.0; Sodium 138  Recent Lipid Panel    Component Value Date/Time   CHOL 185 01/16/2022 1553   TRIG 99 01/16/2022 1553   HDL 72 01/16/2022 1553   CHOLHDL 2.6 01/16/2022 1553   CHOLHDL 3.3 01/26/2021 1213   VLDL 21 01/26/2021 1213   LDLCALC 95 01/16/2022 1553    Physical Exam:    VS:  BP (!) 168/102 (BP Location: Left Arm, Patient Position: Sitting, Cuff Size: Large)   Pulse 79   Ht '6\' 2"'$  (1.88 m)   Wt 212 lb 3.2 oz (96.3 kg)   SpO2 97%   BMI 27.24 kg/m     Wt Readings from Last 3 Encounters:  01/16/22 212 lb 3.2 oz (96.3 kg)  01/14/22 213 lb 6.4 oz (96.8 kg)  12/24/21 205 lb (93 kg)      GEN:  in no acute distress HEENT: Normal NECK: No JVD CARDIAC: RRR no murmurs RESPIRATORY:  Clear to auscultation without rales, wheezing or rhonchi  ABDOMEN: Soft, non-tender, non-distended MUSCULOSKELETAL: No edema; No deformity  SKIN: Warm and dry NEUROLOGIC:  Alert and oriented x 3 PSYCHIATRIC:  Normal affect   ASSESSMENT:    1. CAD in native artery   2. Chronic combined systolic and diastolic congestive heart failure (HCC)   3. Persistent atrial fibrillation ([place])   4. Hyperlipidemia, unspecified hyperlipidemia type   5. Essential hypertension       PLAN:    CAD: Reported exertional chest pain, coronary CTA was done on 12/04/2021, which showed obstructive CAD in mid RCA  [facility] 12/24/2021 showed severe mid RCA stenosis status post DES. -Continue Eliquis, Plavix  -Continue statin  -Continue bisoprol -Planning cardiac rehab  Chronic combined systolic and diastolic heart failure: New diagnosis in the setting of afib w/rvr, alcohol use and heavy smoking history. 10/2020 EF 20%, BIV failure. ECHO 01/26/21 with significant improvement in EF 45-50%, normal RV function.  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 -NYHA Class II symptoms, euvolemic on exam -continue spironolactone 25 mg daily.   -continue entresto 97/103 -continue bisoprolol, will increase dose to 10 mg daily -Continue Farxiga 10 mg daily   Persistent Afib:new diagnosis 10/2020. S/p TEE/DCCV 11/01/20.  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  Referred to EP to evaluate for ablation, saw Dr. Curt Bears and planning ablation 05/2022  Tobacco Use: Counseled on the risks of tobacco use and cessation strongly encouraged.  Working with care guide, has plan to quit smoking next week.   Polycythemia: Likely secondary to smoking, sleep apnea. Hematology following. Jak 2  negative   Alcohol Use: Had cut back on alcohol but now drinking 10 beers per week.  Encouraged cessation  Hypertension: BP elevated in clinic today.  Continue Entresto and spironolactone, will increase bisoprolol as above.  Will provide BP monitor and  asked to check BP daily for next 2 weeks and bring log and monitor to calibrate to pharmacy hypertension clinic appointment in 2 weeks  PAD: Reported severe left calf claudication with rest pain and ischemic changes in toes.  Angiography 03/2021 showed no significant aortoiliac disease on the left with distal SFA occlusion into the proximal popliteal artery, severe focal stenosis in distal right SFA.  Follows with Dr. Fletcher Anon, underwent successful angioplasty and stent placement to left SFA into proximal popliteal artery.  Postprocedure ABIs showed improvement with normal ABIs on left, mild disease (0.81) on right -Continue Eliquis -Continue rosuvastatin -Smoking cessation recommended  Hyperlipidemia: LDL 95 on 11/22/2021, on rosuvastatin 20 mg daily.  Rosuvastatin increased to 40 mg daily.  Will recheck lipid panel  RTC in 3 months  Medication Adjustments/Labs and Tests Ordered: Current medicines are reviewed at length with the patient today.  Concerns regarding medicines are outlined above.  Orders Placed This Encounter  Procedures   Basic metabolic panel   Magnesium   Lipid panel   AMB Referral to [facility] Pharm-D   Meds ordered this encounter  Medications   bisoprolol (ZEBETA) 10 MG tablet    Sig: Take 1 tablet (10 mg total) by mouth daily.    Dispense:  90 tablet    Refill:  3    Dose increase    Patient Instructions  Medication Instructions:  INCREASE bisoprolol to 10 mg daily  *If you need a refill on your cardiac medications before your next appointment, please call your pharmacy*   Lab Work: BMET, Lipid, Mag today  If you have labs (blood work) drawn today and your tests are completely normal, you will receive your  results only by: [place] (if you have MyChart) OR A paper copy in the mail If you have any lab test that is abnormal or we need to change your treatment, we will call you to review the results.  Follow-Up: At [facility], you and your health needs are our priority.  As part of our continuing mission to provide you with exceptional heart care, we have created designated Provider Care Teams.  These Care Teams include your primary Cardiologist (physician) and Advanced Practice Providers (APPs -  Physician Assistants and Nurse Practitioners) who all work together to provide you with the care you need, when you need it.  We recommend signing up for the patient portal called "MyChart".  Sign up information is provided on this After Visit Summary.  MyChart is used to connect with patients for Virtual Visits (Telemedicine).  Patients are able to view lab/test results, encounter notes, upcoming appointments, etc.  Non-urgent messages can be sent to your provider as well.   To learn more about what you can do with MyChart, go to [email or web address].    Your next appointment:   2 weeks with pharmacist --bring blood pressure cuff and blood pressure log with you  3 months with Dr. Gardiner Rhyme  Other Instructions Please check your blood pressure at home twice daily, write it down.  Bring log and blood pressure cuff to appointment with pharmacist  Important Information About Sugar         Signed, [name], MD  01/21/2022 3:12 PM    [place]

## 2022-01-16 NOTE — Progress Notes (Signed)
Appointment Outcome: Completed, Session # 3 Start time: 2:02pm   End time: 2:35pm   Total Mins: 33 minutes  AGREEMENTS SECTION    Overall Goal(s): Smoking cessation - Quit Date (TBD) Stress management                                                    Agreement/Action Steps:  Smoking Cessation Aim to smoke 7-9 cigarettes per day Practice mini quits for as long as possible Smoke 1/2 cigarette at a time Maintain smoking short cigarettes Monitor smoking behavior Track how many cigarettes smoked per day/What time     Stress management Read daily Gardening Practice deep breathing Implement positive self-talk Write in journal 2-3 evenings/week Document smoking cessation journey or use as gratitude journal    Progress Notes:  Patient stated that his smoking is about the same, smoking 10 whole cigarettes per day. Patient stated that he doesn't consistently draw from the cigarette and most of the cigarette burns up while smoking because he is engaged in other activities such as washing dishes. Patient stated that he has both short and long cigarettes that he is currently smoking.   Patient stated that the longest time frame he can practice a mini quit is 4 hours. Patient stated that wanting to smoke in the morning after waking up is given. Patient shared that he has resumed gardening and walk a little outside which can occupy him for 1-2 hours a day. Patient shared that he is still interested in participating in Cardiac Rehab and he will know if he can during his visit today with Dr. Gardiner Rhyme.  Patient stated that he continues to read through magazines or on his Kindle daily. Patient shared that he practices deep breathing when it comes to mind. Patient expressed that he has been trying to stay positive by talking to himself. Patient mentioned that he has not written in his journal because he still finds it trying to write.  Patient expressed that he plans to go cold [place] the day after  Father's Day (June 19th). Patient stated that he is going to implement positive self-talk more and tell himself not to smoke. Patient will throw away any cigarettes he has left and will not purchase another pack afterwards. Asked patient if he was interested in taking any medication or use nicotine patches to assist him with smoking cessation. Patient stated that he does not want to use any medications or patches.      Indicators of Success and Accountability:  Patient stated that he has decided to quit cold [place] on June 19th, which is a step towards holding himself accountable to smoking cessation.  Readiness: Patient is in the action phase of smoking cessation and stress management.  Strengths and Supports: Patient is relying on his determination to quit and drawing from previous attempts that he was successful at going cold [place].  Challenges and Barriers: Patient feeling overwhelmed/stressed may be a challenge to remaining smoke free once going cold [place].    Coaching Outcomes: Patient will continue to implement action steps as outline above until January 20, 2022. Starting January 21, 2022, patient stated that he will longer smoke and will implement the following action steps.  Will follow up with patient on June 22nd to check in to see how he is doing with quitting cold [place] to determine what  additional support he may need.    Overall Goal(s): Smoking cessation - Quit Date (January 21, 2022) Stress management                                                    Agreement/Action Steps:  Smoking Cessation Quit smoking Throw away any remaining cigarettes Do no purchase cigarettes or accept cigarettes from others     Stress management Read daily Gardening or Walk Practice deep breathing Implement positive self-talk    Attempted: Haze Justin - Patient is reading daily, began working in his garden away, and implements positive self-talk. Partial - Patient sometimes smoke a long  cigarette instead of a short. Patient is monitoring how many cigarettes he smokes per day, but not the specific time. Patient practices deep breathing when he remembers. Not met - Patient is smoking 10 cigarettes daily, does not smoke 1/2 cigarette at a time, has not written in his journal.

## 2022-01-17 LAB — LIPID PANEL
Chol/HDL Ratio: 2.6 ratio (ref 0.0–5.0)
Cholesterol, Total: 185 mg/dL (ref 100–199)
HDL: 72 mg/dL (ref >39)
LDL Chol Calc (NIH): 95 mg/dL (ref 0–99)
Triglycerides: 99 mg/dL (ref 0–149)
VLDL Cholesterol Cal: 18 mg/dL (ref 5–40)

## 2022-01-17 LAB — BASIC METABOLIC PANEL
BUN/Creatinine Ratio: 13 (ref 9–20)
BUN: 15 mg/dL (ref 6–24)
CO2: 21 mmol/L (ref 20–29)
Calcium: 9.8 mg/dL (ref 8.7–10.2)
Chloride: 102 mmol/L (ref 96–106)
Creatinine, Ser: 1.18 mg/dL (ref 0.76–1.27)
Glucose: 107 mg/dL — ABNORMAL HIGH (ref 70–99)
Potassium: 5 mmol/L (ref 3.5–5.2)
Sodium: 138 mmol/L (ref 134–144)
eGFR: 72 mL/min/{1.73_m2} (ref >59)

## 2022-01-17 LAB — MAGNESIUM: Magnesium: 2.2 mg/dL (ref 1.6–2.3)

## 2022-01-19 ENCOUNTER — Other Ambulatory Visit: Payer: Self-pay | Admitting: Cardiology

## 2022-01-19 DIAGNOSIS — I4891 Unspecified atrial fibrillation: Secondary | ICD-10-CM

## 2022-01-21 NOTE — Telephone Encounter (Signed)
Eliquis '5mg'$  refill request received. Patient is 61 years old, weight-96.3kg, Crea-1.18 on 01/16/2022, Diagnosis-Afib, and last seen by Dr. Gardiner Rhyme on 01/16/2022. Dose is appropriate based on dosing criteria. Will send in refill to requested pharmacy.

## 2022-01-28 ENCOUNTER — Telehealth: Payer: Self-pay | Admitting: Cardiology

## 2022-01-30 ENCOUNTER — Ambulatory Visit: Payer: 59

## 2022-01-30 ENCOUNTER — Telehealth: Payer: Self-pay

## 2022-01-30 DIAGNOSIS — Z0000 Encounter for general adult medical examination without abnormal findings: Secondary | ICD-10-CM

## 2022-01-30 NOTE — Telephone Encounter (Signed)
Called patient as scheduled for health coaching session. Patient was at a doctor's appointment with his father at the time. Patient has been rescheduled for an in-person session on 6/30 at 2:00pm.  Avelino Leeds, [place], Health Coach [address], [address] [place] [address] Telephone: [phone] Email: [email or web address]

## 2022-01-31 NOTE — Telephone Encounter (Signed)
Left message to call back  

## 2022-02-01 ENCOUNTER — Encounter: Payer: Self-pay | Admitting: Pharmacist

## 2022-02-01 ENCOUNTER — Ambulatory Visit (INDEPENDENT_AMBULATORY_CARE_PROVIDER_SITE_OTHER): Payer: 59

## 2022-02-01 ENCOUNTER — Ambulatory Visit (INDEPENDENT_AMBULATORY_CARE_PROVIDER_SITE_OTHER): Payer: 59 | Admitting: Pharmacist

## 2022-02-01 VITALS — BP 175/108 | HR 74 | Wt 212.4 lb

## 2022-02-01 DIAGNOSIS — I10 Essential (primary) hypertension: Secondary | ICD-10-CM

## 2022-02-01 DIAGNOSIS — I5021 Acute systolic (congestive) heart failure: Secondary | ICD-10-CM

## 2022-02-01 DIAGNOSIS — Z0000 Encounter for general adult medical examination without abnormal findings: Secondary | ICD-10-CM

## 2022-02-01 MED ORDER — EZETIMIBE 10 MG PO TABS: 10.0000 mg | ORAL_TABLET | Freq: Every day | ORAL | 3 refills | Status: DC

## 2022-02-01 NOTE — Patient Instructions (Addendum)
It was nice meeting you  Try to work on increasing your fluid intake and decreasing how much salt you are eating  For your medications:  Morning: Farxiga Spironolactone Entresto  Eliquis Clopidogrel (Plavix)  Evening: Entresto Bisoprolol Rosuvastatin Ezetimibe Eliquis  Continue checking your blood pressure at home.  Try to work on finding non-salty snacks  Please call with any questions  Karren Cobble, PharmD, [place], [place], [place], [place], [place], [address] Phone: [phone], Fax: [phone]

## 2022-02-01 NOTE — Progress Notes (Signed)
Patient ID: Christopher Gutierrez                 DOB: [birth year]                      MRN: [id number]     HPI: Christopher Gutierrez is a 61 y.o. male referred by Dr. Gardiner Rhyme to pharmacy clinic for HF medication management. PMH is significant for HTN, A Fib, CHF, PAD, CAD with elevated coronary calcium, smoking, and alcohol use. EF 45-50%  Today she returns to pharmacy clinic for further medication titration. At last visit with Dr Gardiner Rhyme, bisoprolol was increased to '10mg'$  daily.  Symptomatically, he is feeling poor. Has not been able to quit smoking due to stress and anxiety over his health and his father;s health.  No longer working, is now on disability.  Has also increased alcohol consumption, drinks a 6 pack of beer or more most days of the week.  Reports occasional dizziness and occasional chest pain which he describes as a "pinch". Has not used nitro. Has no swelling/edema. Activity level is low.  Is not following a low sodium diet.  Eats many salted nuts and lunch meats. Drinks mostly beer or Kool-Aid. Does not drink water.  Had visit with care guide earlier today. Patient not ready to quit smoking. Care guide planning on initiating psych referral due to stress/anxiety  Has not been taking [place] because he forgot he picked it up. However, he has been compliant with other HF meds.  Home BP readings: 148/98 160/114 162/106 147/97 164/106 156/99  Current CHF meds:  Bisoprolol '10mg'$  daily Entresto 97-'103mg'$  BID Farxiga '10mg'$  daily (has forgotten to take) Spironolactone '25mg'$  daily  BP goal: <130/80   Wt Readings from Last 3 Encounters:  01/16/22 212 lb 3.2 oz (96.3 kg)  01/14/22 213 lb 6.4 oz (96.8 kg)  12/24/21 205 lb (93 kg)   BP Readings from Last 3 Encounters:  01/16/22 (!) 168/102  01/14/22 (!) 180/110  12/25/21 (!) 162/91   Pulse Readings from Last 3 Encounters:  01/16/22 79  01/14/22 70  12/24/21 (!) 55    Renal function: CrCl cannot be calculated  (Unknown ideal weight.).  Past Medical History:  Diagnosis Date   Atrial fibrillation ([place]) 10/24/2020   Atrial fibrillation and flutter ([place])    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   Hypertension     Current Outpatient Medications on File Prior to Visit  Medication Sig Dispense Refill   acetaminophen (TYLENOL) 325 MG tablet Take 650 mg by mouth every 6 (six) hours as needed for moderate pain or headache.     apixaban (ELIQUIS) 5 MG TABS tablet TAKE 1 TABLET BY MOUTH TWICE A DAY 180 tablet 2   bisoprolol (ZEBETA) 10 MG tablet Take 1 tablet (10 mg total) by mouth daily. 90 tablet 3   cetirizine (ZYRTEC) 10 MG tablet Take 10 mg by mouth as needed for allergies. (Patient not taking: Reported on 01/16/2022)     clopidogrel (PLAVIX) 75 MG tablet Take 75 mg by mouth in the morning.     dapagliflozin propanediol (FARXIGA) 10 MG TABS tablet Take 1 tablet (10 mg total) by mouth daily before breakfast. NEEDS FOLLOW UP APPOINTMENT FOR ANYMORE REFILLS 30 tablet 3   ENTRESTO 97-103 MG TAKE 1 TABLET BY MOUTH TWICE A DAY 60 tablet 6   ezetimibe (ZETIA) 10 MG tablet Take 1 tablet (10 mg total) by mouth daily. 90 tablet  3   Famotidine-Ca Carb-Mag Hydrox (PEPCID COMPLETE PO) Take 1 tablet by mouth daily as needed (acid reflux).     latanoprost (XALATAN) 0.005 % ophthalmic solution Place 1 drop into both eyes at bedtime. (Patient not taking: Reported on 01/16/2022)     Nicotine 10 MG/ML SOLN 1-2 puffs as needed (Patient not taking: Reported on 01/16/2022) 10 mL 2   nitroGLYCERIN (NITROSTAT) 0.4 MG SL tablet Place 1 tablet (0.4 mg total) under the tongue every 5 (five) minutes as needed. (Patient not taking: Reported on 01/16/2022) 25 tablet 3   rosuvastatin (CRESTOR) 40 MG tablet Take 1 tablet (40 mg total) by mouth daily. (Patient taking differently: Take 40 mg by mouth every evening.) 90 tablet 0   Soft Lens Products (REWETTING DROPS) SOLN Apply 1-2 drops to eye daily as needed  (dry/irritated eyes.).     spironolactone (ALDACTONE) 25 MG tablet Take 1 tablet (25 mg total) by mouth daily. 90 tablet 3   No current facility-administered medications on file prior to visit.    Allergies  Allergen Reactions   Beta Adrenergic Blockers Other (See Comments)    Scrotal edema with metoprolol and carvedilol   Tape Rash    Plastic tape     Assessment/Plan:  1. CHF -  BP in room 175/108 which is above goal of <130/80. Similar to home readings. Brought home cuff which appears accurate.  Patient feels poorly however lifestyle is likely contributory.  Was encouraged to decrease salty foods and alcohol consumption. He may very well be dehydrated which is contributing to his elevated BP and him feeling poor along with medications ma. Electrolytes normal during last BMP.  Advised him to drink more water during the day.  Smoking more than 1/2 ppd and not ready to quit.  Patient case also complicated by underlying anxiety (which he endorses) and possible depression. Agree with psych referral.  Needs further BP lowering however he reports he was not aware of how much salt he was likely eating and how alcohol was likely contributing.  Encouraged him to decrease both and continue medication compliance and to add [place].  Patient voiced understanding.  Continue: Bisoprolol '10mg'$  daily Entresto 97-'103mg'$  BID Farxiga '10mg'$  daily Spironolactone '25mg'$  daily Recheck in 2 weeks  Karren Cobble, PharmD, [place], [place], [place], [place], [place], [address] Phone: [phone], Fax: [phone]

## 2022-02-01 NOTE — Telephone Encounter (Signed)
Called pt advised of MD recommendation to start zetia 10 mg PO QD.  Advised to take at the same time as rosuvastatin. Pt is agreeable to plan; order placed and sent to pharmacy of pt choice.

## 2022-02-01 NOTE — Telephone Encounter (Signed)
Pt returning a call from [place], Therapist, sports. Informed pt she is out of the office today.

## 2022-02-01 NOTE — Progress Notes (Unsigned)
Appointment Outcome: Completed, Session #: 4 Start time: 2:00pm   End time: 2:26pm   Total Mins: 26 minutes  AGREEMENTS SECTION     Overall Goal(s): Smoking cessation - Quit Date (January 21, 2022) Stress management                                                    Agreement/Action Steps:  Smoking Cessation Quit smoking Throw away any remaining cigarettes Do no purchase cigarettes or accept cigarettes from others     Stress management Read daily Gardening or Walk Practice deep breathing Implement positive self-talk    Progress Notes:  Patient reported that he was unable to meet the quit date that he set for himself due to smoking from the stress he is experiencing with concerns regarding his health and caring for his father that also has health issues. Patient shared that he is smoking 10 or more cigarettes per day. Patient stated it depends on what he is doing or worrying about that motivates him to smoke more.  Patient has not been able to garden or walk as before due to the heat making him feel bad. Patient shared that his blood pressure was currently elevated when he checked it today. Patient expressed that he felt flushed. Patient stated that he was not able to participate in Cardiac Rehab for his physical activity due to his elevated blood pressure. Patient has a pharmacy appointment today and will be able to discuss his concerns regarding his blood pressure.   Patient reported that he is not sleep well at night and gets an average of 4 hours of sleep at times. Patient stated that since his father is in his home, he mainly relaxes when his father falls asleep. Patient shared that it is also warm in his home due to an old HVAC system. Asked patient if this was a concern that he may need assistance with. Patient stated that they could handle any associated costs regarding purchasing and air conditioning unit.   Patient stated that he tries to practice deep breathing, continues to  read, but has engaged in more negative self-talk than saying and thinking positive statements due to his stress. Patient expressed that sometimes he gets depressed. Asked patient if he would be interested in talking to a therapist regarding the stressors, he is currently dealing with so that he can get additional assistance with healthy coping skills that may make it easier for him to quit smoking. Patient stated that he would try it.    Indicators of Success and Accountability:  Patient is trying to manage his stress as much as possible. Readiness: Patient is in action phase of smoking cessation and stress management. Strengths and Supports: Patient currently does not have support.  Challenges and Barriers: Patient is dealing with multiple stressors and smokes to cope.   Coaching Outcomes: Patient will continue to work towards smoking cessation by trying to smoke 10 cigarettes or less until he sets another quit day.   Patient will wait to be contacted to schedule a counseling session.  Overall Goal(s): Smoking cessation - Quit Date (TBD) Stress management  Agreement/Action Steps:  Smoking Cessation Limit to 10 cigarettes per day 5 cigarettes in the morning and 5 cigarettes in the evening Practice mini quits Smoke 1/2 cigarette at a time Switch to short cigarettes Monitor smoking behavior Track how many cigarettes are smoked per day  Stress management Read daily Gardening or Walk Practice deep breathing Implement positive self-talk    Attempted: Fulfilled - Patient was able to continue reading and practicing deep breathing to manage stress. Partial - Patient has not been able to consistently engage in gardening or taking walks due to the heat making him feel bad.  Not met - Patient was not able to implement his action steps towards smoking cessation set for June 19th. Patient is not implementing positive  self-talk.   Referrals:  Sent a referral for behavioral health to Conception Chancy, PsyD.

## 2022-02-06 ENCOUNTER — Ambulatory Visit: Payer: 59 | Admitting: Cardiology

## 2022-02-13 ENCOUNTER — Ambulatory Visit: Payer: 59

## 2022-02-13 ENCOUNTER — Telehealth: Payer: Self-pay

## 2022-02-13 DIAGNOSIS — Z0000 Encounter for general adult medical examination without abnormal findings: Secondary | ICD-10-CM

## 2022-02-13 NOTE — Telephone Encounter (Signed)
Called patient to hold health coaching session over the phone as scheduled. Patient stated that he prefers to do an in-person visit on 7/18 after his visit with the pharmacy. Patient has been schedule for an in-person appointment on 7/18 at 2:00pm. Informed patient that he was contacted by [facility] to schedule him per referral from 6/30. Patient stated that he was aware of the call he received today but wanted to wait to talk with me first. Patient stated that he is not doing well emotionally and is feeling overwhelmed. Patient was encouraged to return the call to get scheduled. Patient stated that if he is unable to call back today then he will call in the morning.  Lavonne Kinderman Truman Hayward, [facility] Guide, Health Coach [address], [address] [place] [address] Telephone: [phone] Email: [email or web address]

## 2022-02-19 ENCOUNTER — Ambulatory Visit (INDEPENDENT_AMBULATORY_CARE_PROVIDER_SITE_OTHER): Payer: 59 | Admitting: Pharmacist

## 2022-02-19 ENCOUNTER — Encounter: Payer: Self-pay | Admitting: Pharmacist

## 2022-02-19 ENCOUNTER — Ambulatory Visit (INDEPENDENT_AMBULATORY_CARE_PROVIDER_SITE_OTHER): Payer: 59

## 2022-02-19 VITALS — BP 160/104 | HR 67 | Wt 210.4 lb

## 2022-02-19 DIAGNOSIS — F17200 Nicotine dependence, unspecified, uncomplicated: Secondary | ICD-10-CM

## 2022-02-19 DIAGNOSIS — F1010 Alcohol abuse, uncomplicated: Secondary | ICD-10-CM

## 2022-02-19 DIAGNOSIS — Z0000 Encounter for general adult medical examination without abnormal findings: Secondary | ICD-10-CM

## 2022-02-19 DIAGNOSIS — I5020 Unspecified systolic (congestive) heart failure: Secondary | ICD-10-CM | POA: Diagnosis not present

## 2022-02-19 DIAGNOSIS — I10 Essential (primary) hypertension: Secondary | ICD-10-CM | POA: Diagnosis not present

## 2022-02-19 NOTE — Patient Instructions (Addendum)
It was good seeing you again  We would like your blood pressure to be less than 130/80  Continue your:  Bisoprolol '10mg'$  daily Entresto 97-'103mg'$  twice daily Farxiga '10mg'$  daily  Spironolactone '25mg'$  daily  Continue to monitor your blood pressure at home  Work with the care guide to try to reduce stress levels and try to cut down on smoking  Good job on reducing how much salt you are eating  We will see you back in a few weeks  Karren Cobble, PharmD, [place], [place], [place], [place], [place], [address] Phone: [phone], Fax: [phone]

## 2022-02-19 NOTE — Progress Notes (Unsigned)
Appointment Outcome: Completed, Session #: 5 Start time: 2:00pm   End time: 2:40pm   Total Mins: 40 minutes  AGREEMENTS SECTION   Overall Goal(s): Smoking cessation - Quit Date (TBD) Stress management                                                    Agreement/Action Steps:  Smoking Cessation Limit to 10 cigarettes per day 5 cigarettes in the morning and 5 cigarettes in the evening Practice mini quits Smoke 1/2 cigarette at a time Switch to short cigarettes Monitor smoking behavior Track how many cigarettes are smoked per day   Stress management Read daily Gardening or Walk Practice deep breathing Implement positive self-talk   Progress Notes:  Patient shared that his smoking is about the same and that it depended on the type of day he was having. Patient reported that he is smoking a mixture of short and long cigarettes. Patient stated that he usually doesn't think about which pack of cigarettes he is buying at the time because he tries to get in and out of the store quickly, and there are times when the cashier gives him the usual without him even asking because they know him.   Patient stated that he can go without smoking for extended periods of times when he is busy, but when he is bored, he smokes more often. Patient shared that when he lights a cigarette he is not constantly puffing and some of the cigarette burns up. Asked patient if he is smoking 1/2 cigarette at a time. Patient stated that he doesn't like to smoke 1/2 cigarette and the rest later because of the taste.   Patient stated that the major source of his stress is caring for his father. Patient shared that last week he spent all day in the ER with his father. Patient mentioned that during this time due to stress, he spent a lot of time in his car worrying and being bored, which increased his smoking talking on the phone.   Patient reported that he does better at home, when he is busy, but feels that his smoking  is about the same. Patient share that on better days, he will make a list of things to do and get them done. Patient stated that he feels accomplished, and by being busy he smokes less. Patient mentioned that due to not being able to [facility] himself, he gets bored. Patient shared that the heat minimized how often he was able to be outside comfortably. Patient mentioned that he has been able to dig up some plants in his garden when he was able to get outdoors. Patient stated that he mainly walks while grocery shopping, going to get the mail, and running other errands, but is not able to take a walk during the day.   Patient shared that he finds himself disappointed or feeling depressed and that decreases the moments that he engages in positive self-talk. Patient stated that he did return the call to schedule his counseling session, which is set for Aug. 19th.    Patient stated that he talked about making dietary changes during his pharmacy visit. Patient mentioned that has already started trying to cook with no salt, which is hard to do with all the seasonings that he already has that contain salt. Patient stated that he has  become more aware of sodium in food by reading food labels but have not made it a habit yet.   Discussed with the patient the idea of following the serving size so he will be able to track his sodium consumption. Patient shared that he has challenges with practicing portion control and gave the example of putting ranch dressing on a salad. Patient stated that he would add more than the serving size of 2 tablespoons and other things to the salad that would make it less healthy.   Discussed with patient whether he consume a lot of frozen meals and canned goods. Patient stated that he does eat a lot of frozen meals, but not a lot of canned goods. Patient mentioned that if he does choose a canned product, he will get low- or no-sodium added. Patient   Patient mentioned that they discussed  during his visit the type of water that he drinks that contains water and switching to a water that does not contain sodium. Patient stated that he was drinking the [place]el water for the flavor. Patient shared that he doesn't have an issue drinking plain water, but he doesn't want to consume the city water.   Patient stated that he needed to work on reducing the amount of fast food he consumes and mentioned that if he does eat outside of his home that he would choose the healthier options such as chicken over a burger. Discussed ideas such as asking restaurants to prepare his meals without salt.   Discussed with patient the typical recommendation for sodium consumption by providers for patients with hypertension is 1,53m per day. Patient is not sure how much sodium he consumers per day and wants to aim for 2,0029mper day before working his way day.   Indicators of Success and Accountability:  Patient has connected with an additional resource to help him process his stressors, so he can begin coping better to increase his ability to quit smoking.  Readiness: Patient is in action phase of smoking cessation and stress management.  Strengths and Supports: Patient will be receiving support from a counselor starting Aug. 19th.  Challenges and Barriers: Patient is providing care for his father, which increases his stress and coping with smoking.     Coaching Outcomes: Patient wants to continue working on reducing his smoking and managing stress while waiting to start attending counseling.   Patient mentioned that since he has not been dividing up his cigarettes to allot so many during the day and evening, he will focus on putting that back into practice.   After patient's visit with pharmacy today, he is interested in making changes in the amount of sodium he consumes daily.   Patient has revised his action steps as outlined below that he will implement over the next month.   Discussed with patient  if it was a requirement to quit smoking before his next procedure in October and if he has considered setting a quit date at this time. Patient stated that he wasn't told that he had to quit smoking beforehand and stated that does not want to set a quit date because he doesn't want to be disappointed if he doesn't meet it. Explained that the quit date can be revised.    Overall Goal(s): Smoking cessation - Quit Date (TBD) Stress management         Monitor sodium intake  Agreement/Action Steps:  Smoking Cessation Limit to 10 cigarettes per day 5 cigarettes in the morning and 5 cigarettes in the evening Practice mini quits by engaging in various activities throughout the day Make a list of activities to engage in or tasks to complete Smoke 1/2 cigarette at a time Consider cutting half of the cigarette off before smoking    Stress management Read daily Gardening or walk early mornings Practice deep breathing Implement positive self-talk Start counseling on Aug. 19th  Monitor salt intake Read food labels to monitor consumption of 2,080m/sodium per day Practice portion control Avoid using salt while cooking Monitor frozen meal consumption Reduce frequency of eating out Choose healthier options when eating outside of home (e.g., chicken instead of burger) Ask food to not be seasoned with salt Drink water with minimum amount of sodium   Sent patient an e-mail as requested with outline of action steps along with related documents on cooking with less salt, low sodium eating plan, practicing portion control, deep breathing practices,    Attempted: Fulfilled - Patient practices mini quits and monitor his smoking behavior buy tracking how often he smokes. Patient reads daily, has engaged in gardening, implements deep breathing.  Partial - Patient smokes a mixture of short and long cigarettes. Patient practices positive self-talk but also engages  in negative self-talk frequently.  Not met - Patient has not been able to limit his smoking to 10 cigarettes per day. Patient does not smoke 1/2 cigarette at a time.

## 2022-02-19 NOTE — Progress Notes (Unsigned)
Patient ID: Christopher Gutierrez                 DOB: [birth year]                      MRN: [id number]     HPI: Christopher Gutierrez is a 61 y.o. male referred by Dr. Gardiner Rhyme to pharmacy clinic for HF medication management. PMH is significant for HTN, A Fib, CHF, PAD, CAD with elevated coronary calcium, smoking, and alcohol use. EF 45-50%.  Patient presents today for CHF follow up. Reports he still is not feeling well and stress levels have increased. Father recently hospitalized and now home. Home health has been called and nurse comes over 2 times a week.  Father can no longer walk or get around by himself.  Continues to smoke about 1 ppd and drink about 6 beers most days of the week.  Has been working on adjusting diet however. Is looking for reduced sodium or no salt foods and has reduced the amount of fast food he eats.  Reports compliance with medication. Now uses an AM/PM pill box and follows the schedule given to him at last visit.  Has been checking BP at home but not as regularly as before.  Reports systolic readings typically 150-160.  Only sleepig 4 hours a night.   Has follow up visit with care guide today who has referred him to behavioral health however first appointment is not until late August,.  Denies swelling, chest pain, SOB.  Current CHF meds:  Bisoprolol '10mg'$  daily Entresto 97-'103mg'$  BID Farxiga '10mg'$  daily (has forgotten to take) Spironolactone '25mg'$  daily  BP goal: <130/80   Wt Readings from Last 3 Encounters:  02/19/22 210 lb 6.4 oz (95.4 kg)  02/01/22 212 lb 6.4 oz (96.3 kg)  01/16/22 212 lb 3.2 oz (96.3 kg)   BP Readings from Last 3 Encounters:  02/19/22 (!) 160/104  02/01/22 (!) 175/108  01/16/22 (!) 168/102   Pulse Readings from Last 3 Encounters:  02/19/22 67  02/01/22 74  01/16/22 79    Renal function: CrCl cannot be calculated (Patient's most recent lab result is older than the maximum 21 days allowed.).  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     apixaban (ELIQUIS) 5 MG TABS tablet TAKE 1 TABLET BY MOUTH TWICE A DAY 180 tablet 2   bisoprolol (ZEBETA) 10 MG tablet Take 1 tablet (10 mg total) by mouth daily. 90 tablet 3   cetirizine (ZYRTEC) 10 MG tablet Take 10 mg by mouth as needed for allergies.     clopidogrel (PLAVIX) 75 MG tablet Take 75 mg by mouth in the morning.     dapagliflozin propanediol (FARXIGA) 10 MG TABS tablet Take 1 tablet (10 mg total) by mouth daily before breakfast. NEEDS FOLLOW UP APPOINTMENT FOR ANYMORE REFILLS 30 tablet 3   ENTRESTO 97-103 MG TAKE 1 TABLET BY MOUTH TWICE A DAY 60 tablet 6   ezetimibe (ZETIA) 10 MG tablet Take 1 tablet (10 mg total) by mouth daily. 90 tablet 3   Famotidine-Ca  Carb-Mag Hydrox (PEPCID COMPLETE PO) Take 1 tablet by mouth daily as needed (acid reflux).     latanoprost (XALATAN) 0.005 % ophthalmic solution Place 1 drop into both eyes at bedtime.     Nicotine 10 MG/ML SOLN 1-2 puffs as needed 10 mL 2   nitroGLYCERIN (NITROSTAT) 0.4 MG SL tablet Place 1 tablet (0.4 mg total) under the tongue every 5 (five) minutes as needed. 25 tablet 3   rosuvastatin (CRESTOR) 40 MG tablet Take 1 tablet (40 mg total) by mouth daily. (Patient taking differently: Take 40 mg by mouth every evening.) 90 tablet 0   Soft Lens Products (REWETTING DROPS) SOLN Apply 1-2 drops to eye daily as needed (dry/irritated eyes.).     spironolactone (ALDACTONE) 25 MG tablet Take 1 tablet (25 mg total) by mouth daily. 90 tablet 3   No current facility-administered medications on file prior to visit.    Allergies  Allergen Reactions    Beta Adrenergic Blockers Other (See Comments)    Scrotal edema with metoprolol and carvedilol   Tape Rash    Plastic tape     Assessment/Plan:  1. CHF -  HYPERTENSION CONTROL Vitals:   02/19/22 1320 02/19/22 1334  BP: (!) 151/88 (!) 160/104    The patient's blood pressure is elevated above target today. {Click here to indicate intervention taken to address high BP  Refresh Note :1}  The following intervention was performed to address the patient's elevated BP:  - Blood pressure will be monitored at home to determine if medication changes need to be made.     BP in room 151/88 which is above goal of <130/80 although is improved from last visit.   Stress, anxiety, smoking, and alcohol use likely contributory. Would benefit from mental health consult.  Diet changes may have also contributed to BP reduction.  Advised next step for BP lowering would be hydralazine however patient declines at this time. Does not want to take any new medications.  Will check in 2-3 weeks.  Continue: Bisoprolol '10mg'$  daily Entresto 97-'103mg'$  BID Farxiga '10mg'$  daily Spironolactone '25mg'$  daily Recheck in 2 weeks  Karren Cobble, PharmD, [place], [place], [place], [place], [place], [address] Phone: [phone], Fax: [phone]

## 2022-02-24 ENCOUNTER — Other Ambulatory Visit: Payer: Self-pay | Admitting: Cardiology

## 2022-02-25 ENCOUNTER — Inpatient Hospital Stay: Payer: 59 | Attending: Oncology

## 2022-02-25 ENCOUNTER — Encounter: Payer: Self-pay | Admitting: Nurse Practitioner

## 2022-02-25 ENCOUNTER — Inpatient Hospital Stay (HOSPITAL_BASED_OUTPATIENT_CLINIC_OR_DEPARTMENT_OTHER): Payer: 59 | Admitting: Nurse Practitioner

## 2022-02-25 VITALS — BP 138/95 | HR 71 | Temp 97.9°F | Resp 18 | Ht 74.0 in | Wt 212.2 lb

## 2022-02-25 DIAGNOSIS — I110 Hypertensive heart disease with heart failure: Secondary | ICD-10-CM | POA: Insufficient documentation

## 2022-02-25 DIAGNOSIS — I509 Heart failure, unspecified: Secondary | ICD-10-CM | POA: Diagnosis not present

## 2022-02-25 DIAGNOSIS — D751 Secondary polycythemia: Secondary | ICD-10-CM

## 2022-02-25 DIAGNOSIS — I4891 Unspecified atrial fibrillation: Secondary | ICD-10-CM | POA: Diagnosis not present

## 2022-02-25 DIAGNOSIS — F17200 Nicotine dependence, unspecified, uncomplicated: Secondary | ICD-10-CM | POA: Diagnosis not present

## 2022-02-25 LAB — CBC WITH DIFFERENTIAL (CANCER CENTER ONLY)
Abs Immature Granulocytes: 0.12 10*3/uL — ABNORMAL HIGH (ref 0.00–0.07)
Basophils Absolute: 0.1 10*3/uL (ref 0.0–0.1)
Basophils Relative: 1 %
Eosinophils Absolute: 0.1 10*3/uL (ref 0.0–0.5)
Eosinophils Relative: 1 %
HCT: 51 % (ref 39.0–52.0)
Hemoglobin: 17.6 g/dL — ABNORMAL HIGH (ref 13.0–17.0)
Immature Granulocytes: 1 %
Lymphocytes Relative: 15 %
Lymphs Abs: 1.4 10*3/uL (ref 0.7–4.0)
MCH: 33.5 pg (ref 26.0–34.0)
MCHC: 34.5 g/dL (ref 30.0–36.0)
MCV: 97.1 fL (ref 80.0–100.0)
Monocytes Absolute: 0.7 10*3/uL (ref 0.1–1.0)
Monocytes Relative: 8 %
Neutro Abs: 6.5 10*3/uL (ref 1.7–7.7)
Neutrophils Relative %: 74 %
Platelet Count: 156 10*3/uL (ref 150–400)
RBC: 5.25 MIL/uL (ref 4.22–5.81)
RDW: 12.4 % (ref 11.5–15.5)
WBC Count: 8.8 10*3/uL (ref 4.0–10.5)
nRBC: 0 % (ref 0.0–0.2)

## 2022-02-25 NOTE — Progress Notes (Signed)
  [facility] OFFICE PROGRESS NOTE   Diagnosis: Erythrocytosis  INTERVAL HISTORY:   Christopher Gutierrez returns as scheduled.  He continues to smoke, estimating less than 1 pack/day at present.  He is trying to quit.  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  He underwent placement of a drug-eluting stent.  Objective:  Vital signs in last 24 hours:  Blood pressure (!) 138/95, pulse 71, temperature 97.9 F (36.6 C), temperature source Oral, resp. rate 18, height '6\' 2"'$  (1.88 m), weight 212 lb 3.2 oz (96.3 kg), SpO2 100 %.    Resp: Lungs clear bilaterally. Cardio: Regular rate and rhythm. GI: No hepatosplenomegaly. Vascular: No leg edema.  Left lower leg is slightly larger than the right lower leg.   Lab Results:  Lab Results  Component Value Date   WBC 8.8 02/25/2022   HGB 17.6 (H) 02/25/2022   HCT 51.0 02/25/2022   MCV 97.1 02/25/2022   PLT 156 02/25/2022   NEUTROABS 6.5 02/25/2022    Imaging:  No results found.  Medications: I have reviewed the patient's current medications.  Assessment/Plan: Erythrocytosis, likely secondary to cardiovascular disease, ongoing tobacco use and potentially Aldactone 11/30/2020 JAK2 [id number] testing negative   Hypertension Atrial fibrillation CHF Alcohol and tobacco use    Disposition: Christopher Gutierrez remains stable from a hematologic standpoint.  We reviewed the CBC from today.  He has stable mild erythrocytosis which is likely related to a combination of cardiovascular disease, tobacco use, Aldactone may be contributing.  Plan to continue to follow with observation.  He will return for an office visit and CBC in 9 months.    Ned Card ANP/GNP-BC   02/25/2022  1:23 PM

## 2022-03-17 ENCOUNTER — Other Ambulatory Visit: Payer: Self-pay | Admitting: Cardiology

## 2022-03-18 NOTE — Telephone Encounter (Signed)
This is Dr. Schumann's pt 

## 2022-03-19 ENCOUNTER — Ambulatory Visit (INDEPENDENT_AMBULATORY_CARE_PROVIDER_SITE_OTHER): Payer: 59 | Admitting: Psychologist

## 2022-03-19 DIAGNOSIS — F411 Generalized anxiety disorder: Secondary | ICD-10-CM | POA: Diagnosis not present

## 2022-03-19 NOTE — Progress Notes (Signed)
                [name], PsyD 
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
## 2022-03-19 NOTE — Plan of Care (Signed)

## 2022-03-20 ENCOUNTER — Ambulatory Visit (INDEPENDENT_AMBULATORY_CARE_PROVIDER_SITE_OTHER): Payer: 59 | Admitting: Pharmacist

## 2022-03-20 ENCOUNTER — Ambulatory Visit (INDEPENDENT_AMBULATORY_CARE_PROVIDER_SITE_OTHER): Payer: 59

## 2022-03-20 ENCOUNTER — Encounter: Payer: Self-pay | Admitting: Cardiology

## 2022-03-20 VITALS — BP 157/95 | HR 66 | Wt 211.8 lb

## 2022-03-20 DIAGNOSIS — I5020 Unspecified systolic (congestive) heart failure: Secondary | ICD-10-CM | POA: Diagnosis not present

## 2022-03-20 DIAGNOSIS — Z0000 Encounter for general adult medical examination without abnormal findings: Secondary | ICD-10-CM

## 2022-03-20 MED ORDER — HYDRALAZINE HCL 25 MG PO TABS: 25.0000 mg | ORAL_TABLET | Freq: Two times a day (BID) | ORAL | 2 refills | Status: DC

## 2022-03-20 NOTE — Progress Notes (Unsigned)
Appointment Outcome: Completed, Session #: 6 Start time: 2:10pm   End time: 2:51pm  Total Mins: 41 minutes  AGREEMENTS SECTION   Overall Goal(s): Smoking cessation - Quit Date (TBD) Stress management         Reduce sodium intake                                            Agreement/Action Steps:  Smoking Cessation Limit to 10 cigarettes per day 5 cigarettes in the morning and 5 cigarettes in the evening Practice mini quits by engaging in various activities throughout the day Make a list of activities to engage in or tasks to complete Smoke 1/2 cigarette at a time Consider cutting half of the cigarette off before smoking    Stress management Read daily Gardening or walk early mornings Practice deep breathing Implement positive self-talk Start counseling on Aug. 19th   Reduce Sodium Intake Read food labels to monitor consumption of 2,035m/sodium per day Practice portion control Avoid using salt while cooking Monitor frozen meal consumption Reduce frequency of eating out Choose healthier options when eating outside of home (e.g., chicken instead of burger) Ask food to not be seasoned with salt Drink water with minimum amount of sodium   Progress Notes:  Patient had his first session with his counselor on August 15th and received and email with information to read and strategies to start implementing. Patient shared that he discussed smoking cessation with his counselor, and it was started that reducing his stress first will help him work towards smoking cessation more effectively. Patient mentioned that his counselor is aware of him having a health coach for smoking cessation.   Patient shared that he did try taking out 10 cigarettes for the day (5 cigarettes for the day and 5 cigarettes for the evening). Patient stated that this strategy did help him manage his smoking, but he would forget to do this consistently depending on how busy he was in the morning. Patient explained  that if he wrote down his steps that it would help him to remember his steps better because it was a strategy that he has used before. Patient shared that he has thought about getting a large calendar or board that he could write his steps on as a daily reminder. Patient stated that setting a reminder in his phone would not be a helpful strategy.   Patient mentioned that he was able to smoke 10 cigarettes or less per day when he was busy and not as stressed. Patient shared that most days he did smoke more than 10 cigarettes. Patient reported that to help him practice a mini quit he tried not to think about smoking because if he was thinking of keeping himself from smoking, he would want a cigarette. Patient shared that the longest period that he was able to practice a mini quit was approximately 6 hours.   Patient reported that he has not cut off half of each cigarette before smoking and does not stop after smoking half of a cigarette. Patient stated that when he smokes, he is not constantly puffing and most of the cigarette burns up. Patient stated that if he were to cut the cigarette, he would end up lighting more cigarettes or finding an alternative way to smoke the tobacco from the cut portions.   Patient stated that most times when he was stressed, he  tried not to think about things and wasn't dealing with his stressors. Patient mentioned that his current stated of health is his biggest concerned. Patient shared that he is having trouble with his vision from having glaucoma and pressure in his eyes from cataracts. Patient reported that he cannot have surgery for the glaucoma until he can relieve the eye pressure and is currently using eye drops to help. Patient shared that because of this issue it is hard to use a computer. Patient mentioned that he is not able to do things that he uses to enjoy because it takes 2-3xs longer to do everyday activities. Patient stressed that his sleep is another concern  because he doesn't sleep more than 3-4 hours per night.   Patient mentioned that he started seeing his counselor on August 15 and was emailed some material to read but has not got around to it yet. Patient reported that when he was stressed, he would try talking positive to himself sometimes and found it to be easier during some moments compared to others. Patient did not state whether he practices deep breathing intentionally or if it occurs naturally but does find himself taking deep breathes at times. Patient mentioned that he would go outside at time to walk but was not able to engage in this activity often due to the heat. Patient stated that he hasn't gardened as much because of the heat. Patient reported that he continues to read daily.  Patient reported that he has been paying more attention to how much sodium he consumes daily by reading food labels. Patient shared that he realized that some of his frozen meals were high in sodium, and he switched them out for another meal that was lower in sodium. Patient stated that he limits himself to eating one frozen meal for dinner.   Patient mentioned that he does not cook often due to not wanting to waste food. Patient stated that when he does cook, he does not use salt. Patient stated that when he cooks the food last for two days because he was used to cooking large meals for his father and sometimes his son would come over to eat. Patient stated that since they have cut back on how much they eat, he buys foods that he doesn't have to spend much time preparing. Patient stated that he may eat a sandwich with deli meat or a chicken salad sandwich for lunch.   Patient shared that he has cut back on how often he eats out. Patient reported that he eats fast food no more than once a week. Patient stated that he tries to choose the healthier options available except on one occasion when he ordered from [employer]. Patient stated that he is not able to track his  sodium consumption when he eats out but is sure he goes over 2,029m of sodium on those days. Patient stated that he doesn't eat fries because they are too salty but has not asked for restaurants not to use salt on his food. Patient reported that when he eats from home, he can maintain consumption of 2,0081mof sodium per day.   Patient reported that he has started drinking tap water from the sink and drinks about 2-20oz bottles of plain water per day. Patient shared that he drinks slightly sweetened Kool-Aid where he gets the rest of his water during the day. Patient expressed that he needs to drink more plain water during the day but has not considered setting that as  a goal.     Indicators of Success and Accountability:  Patient stated that he has started paying more attention to the sodium in the food that he consumes by reading food labels and is minimizing his fast-food consumption to once a week.  Readiness: Patient is in the action phase of smoking cessation, stress management, and reducing his sodium intake.  Strengths and Supports: Patient is being supported by a Social worker. Patient is trying to remain optimistic.  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     Coaching Outcomes: Emailed patient a copy of his action steps along with the MyWeeklyTracker form so that the patient can write down his progress with each step. Patient mentioned that he would write down his steps on a large wall calendar of board as a daily reminder of what steps he is implementing for his goals.   Sent message to Dr. Gardiner Rhyme regarding the patient's concerns with his eyesight and ability to return to work in addition to his heart health. Message also addressed concerns with the patient's ability to sleep for further follow-up.   Patient has decided that cutting half of the cigarette off  before smoking is not a good idea because he would find an alternative way to smoke the cut off tobacco rather than waste it.   Patient is not scheduled to meet with his counselor again until Sept 16th. Patient will work to read the material that the counselor emailed him to start working on managing his stressors and emotional state.   Patient will continue to implement his action steps as outlined below over the next two weeks. Patient has been scheduled for a 2-week f/u over the phone to track progress/challenges with writing out steps as a reminder to implement consistently.    Overall Goal(s): Smoking cessation - Quit Date (TBD) Stress management         Reduce sodium intake                                            Agreement/Action Steps:  Smoking Cessation Limit to 10 cigarettes per day 5 cigarettes in the morning and 5 cigarettes in the evening Practice mini quits by engaging in various activities throughout the day Make a list of activities to engage in or tasks to complete Smoke 1/2 cigarette at a time  Stress management Read daily Gardening or walk early mornings Practice deep breathing Implement positive self-talk Continue counseling sessions as scheduled Read material provided by counselor Implement strategies to manage stress from sessions   Reduce Sodium Intake Read food labels to monitor consumption of 2,030m/sodium per day Practice portion control Avoid using salt while cooking Monitor frozen meal consumption Reduce frequency of eating out Choose healthier options when eating outside of home (e.g., chicken instead of burger) Ask food to not be seasoned with salt Drink water with minimum amount of sodium   Attempted: Fulfilled - Patient practiced mini quits. Patient has continued to read daily. Patient started counseling as of August 15th. Patient is reading food labels and practicing portion control. Patient avoids using salt when cooking. Patient started  monitoring his frozen meal consumption and reduced the frequency of eating out. Patient is drinking tap water to reduce the sodium he consumes in bottled water.  Partial - Patient has been able to smoke 10 or less cigarettes per day on some occasions when he was busy or took out the allotted number of cigarettes for the day, but mostly smoke over 10 cigarettes per day. Patient implemented positive self-talk at times along with practicing deep breathing. Patient was able to go outdoors to walk around on some occasions. Patient has been able to choose a healthier option when eating out except for one meal.  Not met - Patient did not attempt to cut off half of the cigarette before smoking or limit self to smoking 1/2 a cigarette at a time. Patient has not asked for his food not to be seasoned with salt when eating out.    Not attempted: Dropped/Revised - Patient will not work towards cutting half the cigarette off before smoking moving forward.

## 2022-03-20 NOTE — Patient Instructions (Signed)
It was good seeing you again  Your blood pressure is improving but still above goal of <130/80  We will start a new medication called hydralazine '25mg'$  twice a day  Continue:  Bisoprolol '10mg'$  daily Entresto 97-'103mg'$  twice a day [place] '10mg'$  daily Spironolactone '25mg'$  daily  Continue to watch your salt intake and work with Amy regarding smoking  Karren Cobble, PharmD, [place], [place], [place], [place], [place], [address] Phone: [phone] , Fax: [phone]

## 2022-03-20 NOTE — Progress Notes (Signed)
Patient ID: Christopher Gutierrez                 DOB: 1961/03/17                      MRN: [id number]     HPI: Christopher Gutierrez is a 61 y.o. male referred by Dr. Gardiner Rhyme to pharmacy clinic for HF medication management. PMH is significant for HTN, A Fib, CHF, PAD, CAD with elevated coronary calcium, smoking, and alcohol use. EF 45-50%.  Patient presents today for CHF follow up. This is patient's third follow up. Continues to feel stress at home due to his father's health and that he is not working. Father can no longer walk or get around by himself.  Continues to smoke about 1 ppd and drink about 6 beers most days of the week. Is now working with Care Guide for smoking cessation.    Reports compliance with medication. Now uses an AM/PM pill box and follows the schedule given to him at last visit.  Has been checking BP at home but not as regularly as before.  Reports systolic readings typically 150-160.  At last visit, hydralazine was suggested but patient was hesitant to add on more medications.  Only sleeping about 4 hours per night.  Has follow up visit with care guide today who has referred him to behavioral health however first appointment is not until late August.  Denies swelling, chest pain, SOB.  Current CHF meds:  Bisoprolol '10mg'$  daily Entresto 97-'103mg'$  BID Farxiga '10mg'$  daily  Spironolactone '25mg'$  daily  BP goal: <130/80  Wt Readings from Last 3 Encounters:  02/19/22 210 lb 6.4 oz (95.4 kg)  02/01/22 212 lb 6.4 oz (96.3 kg)  01/16/22 212 lb 3.2 oz (96.3 kg)   BP Readings from Last 3 Encounters:  02/19/22 (!) 160/104  02/01/22 (!) 175/108  01/16/22 (!) 168/102   Pulse Readings from Last 3 Encounters:  02/19/22 67  02/01/22 74  01/16/22 79    Renal function: CrCl cannot be calculated (Patient's most recent lab result is older than the maximum 21 days allowed.).  Past Medical History:  Diagnosis Date   Atrial fibrillation ([place]) 10/24/2020   Atrial  fibrillation and flutter ([place])    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   Hypertension     Current Outpatient Medications on File Prior to Visit  Medication Sig Dispense Refill   acetaminophen (TYLENOL) 325 MG tablet Take 650 mg by mouth every 6 (six) hours as needed for moderate pain or headache.     apixaban (ELIQUIS) 5 MG TABS tablet TAKE 1 TABLET BY MOUTH TWICE A DAY 180 tablet 2   bisoprolol (ZEBETA) 10 MG tablet Take 1 tablet (10 mg total) by mouth daily. 90 tablet 3   cetirizine (ZYRTEC) 10 MG tablet Take 10 mg by mouth as needed for allergies.     clopidogrel (PLAVIX) 75 MG tablet Take 75 mg by mouth in the morning.     dapagliflozin propanediol (FARXIGA) 10 MG TABS tablet Take 1 tablet (10 mg total) by mouth daily before breakfast. NEEDS FOLLOW UP APPOINTMENT FOR ANYMORE REFILLS 30 tablet 3   ENTRESTO 97-103 MG TAKE 1 TABLET BY MOUTH TWICE A DAY 60 tablet 6   ezetimibe (ZETIA) 10 MG tablet Take 1 tablet (10 mg total) by mouth daily. 90 tablet 3   Famotidine-Ca Carb-Mag Hydrox (PEPCID COMPLETE PO) Take 1 tablet by mouth daily as needed (  acid reflux).     latanoprost (XALATAN) 0.005 % ophthalmic solution Place 1 drop into both eyes at bedtime.     Nicotine 10 MG/ML SOLN 1-2 puffs as needed 10 mL 2   nitroGLYCERIN (NITROSTAT) 0.4 MG SL tablet Place 1 tablet (0.4 mg total) under the tongue every 5 (five) minutes as needed. 25 tablet 3   rosuvastatin (CRESTOR) 40 MG tablet Take 1 tablet (40 mg total) by mouth daily. (Patient taking differently: Take 40 mg by mouth every evening.) 90 tablet 0   Soft Lens Products (REWETTING DROPS) SOLN Apply 1-2 drops to eye daily as needed (dry/irritated eyes.).     spironolactone (ALDACTONE) 25 MG tablet Take 1 tablet (25 mg total) by mouth daily. 90 tablet 3   No current facility-administered medications on file prior to visit.    Allergies  Allergen Reactions   Beta Adrenergic Blockers Other (See Comments)    Scrotal edema  with metoprolol and carvedilol   Tape Rash    Plastic tape     Assessment/Plan:  1. CHF -  HYPERTENSION CONTROL    BP in room 157/95 which is above goal of <130/80 and similar to previous visit. Stress, anxiety, smoking, and alcohol use likely contributory. Would benefit from mental health consult. Has improved diet though and is eating less sodium  Advised again next step for BP lowering would be hydralazine. Patient is agreeable at this time. Will start slow at '25mg'$  BID.  Has follow up with Dr Gardiner Rhyme next month, will recheck with patient after.  Continue: Bisoprolol '10mg'$  daily Entresto 97-'103mg'$  BID Farxiga '10mg'$  daily Spironolactone '25mg'$  daily Start hydralazine '25mg'$  BID Recheck in 4 weeks  Karren Cobble, PharmD, [place], [place], [place], [place], [place], [address] Phone: [phone], Fax: [phone]

## 2022-03-29 ENCOUNTER — Encounter: Payer: Self-pay | Admitting: Pharmacist

## 2022-04-03 ENCOUNTER — Telehealth: Payer: Self-pay

## 2022-04-03 ENCOUNTER — Ambulatory Visit: Payer: 59

## 2022-04-03 DIAGNOSIS — Z0000 Encounter for general adult medical examination without abnormal findings: Secondary | ICD-10-CM

## 2022-04-03 NOTE — Telephone Encounter (Signed)
Called patient to hold health coaching appointment over the phone. Patient was in the process of taking care of some responsibilities and requested a call back tomorrow. Patient has been rescheduled for his telephonic health coaching appointment on 04/04/22 at 2:00pm.  Roberto Romanoski [place] [facility] Guide, Health Coach [address], [address] [place] [address] Telephone: [phone] Email: [email or web address]

## 2022-04-04 ENCOUNTER — Ambulatory Visit: Payer: 59 | Attending: Internal Medicine

## 2022-04-04 DIAGNOSIS — Z0000 Encounter for general adult medical examination without abnormal findings: Secondary | ICD-10-CM

## 2022-04-04 NOTE — Progress Notes (Signed)
Appointment Outcome: Completed, Session #: 7                         Start time: 2:00pm   End time: 2:40pm   Total Mins: 40 minutes  AGREEMENTS SECTION   Overall Goal(s): Smoking cessation - Quit Date (TBD) Stress management         Reduce sodium intake                                            Agreement/Action Steps:  Smoking Cessation Limit to 10 cigarettes per day 5 cigarettes in the morning and 5 cigarettes in the evening Practice mini quits by engaging in various activities throughout the day Make a list of activities to engage in or tasks to complete Smoke 1/2 cigarette at a time   Stress management Read daily Gardening or walk early mornings Practice deep breathing Implement positive self-talk Continue counseling sessions as scheduled Read material provided by counselor Implement strategies to manage stress from sessions   Reduce Sodium Intake Read food labels to monitor consumption of 2,024m/sodium per day Practice portion control Avoid using salt while cooking Monitor frozen meal consumption Reduce frequency of eating out Choose healthier options when eating outside of home (e.g., chicken instead of burger) Ask food to not be seasoned with salt Drink water with minimum amount of sodium    Progress Notes:  Patient stated that his smoking is about the same since the last session. Patient shared that he has been working on trying not to let himself get overwhelmed as before to help with reducing his smoking. Patient mentioned that he finds something to do, read, or watch tv to keep himself from constantly worrying about things. Patient shared a situation that caused him to worry within the past two weeks.  Patient mentioned that he tries to stick to smoking 10 cigarettes per day and rarely smoke a pack a day. Patient shared that his smoking is still dependent on how busy he is. Patient stated that he has been getting out the house more to run errands and walking  around in stores. Patient shared that he has cleaned out his garden and it trying to decide what to plant next. Patient stated that these activities help him practice mini quits to increase the time in between smoking. Patient does not smoke 1/2 cigarette at a time but does not constantly take draws from the cigarette. Patient stated that when smoking the cigarette burns up, especially if he is multitasking.   Patient shared that he has nicotine patches and lozenges from a previous hospital visit. Patient reported that using NRT makes him agitated. Patient stated that he has the 236mnicotine patch at this time.   Patient continues to have issues with sleeping through the night. Patient shared that he practices deep breathing at night to help him relax before bed. Patient mentioned that he also experiences muscle cramping that interferes with his sleep. Patient is waiting to speak with his counselor on 9/16.   Patient stated that he has been reading food labels more to monitor his consumption of 2,00039mf sodium per day. Patient expressed that his challenge is not knowing what's in everything he eats and that the label is per serving size. Patient stated that he is not practicing portion control and that if he likes how something  taste he will eat the entire amount verses a serving.   Patient mentioned that he hasn't be thirsty, so he is drinking a minimum of 2 bottles of plain water. Patient stated that he makes a lightly sweetened Kool-Aid to give him an alternative to drinking plain water. Patient has not set a goal for the amount of water he wants to consume per day.    Patient stated that he is managing to eat one frozen meal per day for dinner. Patient reported that he continues to avoid using salt and seasonings when he does cook. Patient stated that recently he hasn't cooked as much but has avoiding eating out at [facility] and [place]. Patient shared that he has been able to avoid eating  fast food by reminding himself that the food is not healthy and that he is not going to eat there when he is out running errands.    Indicators of Success and Accountability:  Patient has actively been implementing various action steps to manage his stress better.  Readiness: Patient is in the action stage of smoking cessation, stress management, and reducing sodium consumption.  Strengths and Supports: Patient has the support of a Social worker. Patient is becoming more mindful of his health choices.  Challenges and Barriers: Patient is trying to reduce his stress to help him cut back on smoking but is weary of using NRT or smoking cessation medications.     Coaching Outcomes: Discussed with patient how he can apply the strategies that he implemented to avoid eating fast food over the past two weeks to reducing his cigarette smoking. Patient shared that he never thought about it until now. Patient will think about how he can apply the same principal to manage his eating behaviors to aid in smoking cessation.   Discussed with patient stretching before bed that he thought was a good idea to help his muscles relax hoping that he will be able to sleep better. Patient is considering the use of YouTube videos to guide his stretching routine. Patient was informed of the advice from Dr. Gardiner Rhyme about reaching out to his PCP to discuss insomnia. Patient stated that he was going to wait and see how he adjust to his newest medication before considering having that discussion.   Encouraged patient to discuss with Dr. Gardiner Rhyme his experience with the use of the 52m patches and lozenges to see what his recommendation is about the use of the patches.   Patient did not make any additional changes to his action steps during this session and will follow-up in 3 weeks after his visit with Dr. SGardiner Rhymeand the pharmacy to determine the next course of action regarding his smoking cessation plan.     Attempted: Fulfilled - Patient continues to practice mini quits and engage in his action steps to manage stress. Patient is reading food labels to monitor his intake of 2,0067mof sodium per day. Patient avoids using salt while cooking, minimizes his frozen meal consumption to once daily, had reduced how often he eats fast food, and is drinking plain water to reduce the amount of sodium consumed from [employer] water.  Partial - Patient manages to smoke 10 cigarettes on occasion, but typically smokes more than 10 per day.  Not met - Patient did not attempt to smoke half a cigarette at a time. Patient is not practicing portion control.

## 2022-04-21 ENCOUNTER — Other Ambulatory Visit (HOSPITAL_COMMUNITY): Payer: Self-pay | Admitting: Cardiovascular Disease

## 2022-04-21 ENCOUNTER — Other Ambulatory Visit: Payer: Self-pay | Admitting: Cardiology

## 2022-04-21 NOTE — Progress Notes (Unsigned)
Cardiology Office Note:    Date:  04/25/2022   ID:  Christopher Gutierrez, DOB [birth year], MRN [id number]  PCP:  [name], DO  Cardiologist:  [name], MD  Electrophysiologist:  Constance Haw, MD   Referring MD: [name], DO   Chief Complaint  Patient presents with   Congestive Heart Failure    History of Present Illness:    Christopher Gutierrez is a 61 y.o. male with a hx of HTN, tobacco use, alcohol use who presents for follow-up.  He was referred by Dr. [name] for evaluation of atrial fibrillation.  At initial clinic visit on 10/27/20, he appeared hypervolemic on exam and was in A. fib with rates up to 160s.  Discussed admission to the hospital for diuresis, rate control of his A. fib and eventual TEE/DCCV, but he declined.  At follow-up visit on 10/30/2020 he appeared more volume overloaded on direct admission was arranged.  Echo showed EF 20 to 25%, reduced RV function.  He had good response to IV diuretics and underwent successful TEE/DCCV on 11/01/2020 with restoration of sinus rhythm.  Initially followed with Dr. [name] in advanced heart failure after his hospitalization.  He was started on Entresto, spironolactone, and bisoprolol.  Repeat echocardiogram 01/26/2021 showed significant improvement in EF to 45 to 50%, and normal RV function.  Coronary CTA was done on 12/04/2021, which showed obstructive CAD in mid RCA.  LHC 12/24/2021 showed severe mid RCA stenosis status post DES.  Since last clinic visit, reports has been doing okay.  Reports BP significantly improved since starting hydralazine.  He denies any chest pain or dyspnea.  Does report some pain in his legs with walking.  Continues to smoke 0.5 packs/day.  He is taking Eliquis and Plavix, denies any bleeding issues.  He is drinking about 2 beers per day.   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   Hypertension     Past Surgical History:  Procedure Laterality Date   ABDOMINAL AORTOGRAM W/LOWER EXTREMITY N/A 03/07/2021   Procedure: ABDOMINAL AORTOGRAM W/LOWER EXTREMITY;  Surgeon: Wellington Hampshire, MD;  Location: [place] [facility];  Service: Cardiovascular;  Laterality: N/A;   CARDIOVERSION N/A 11/01/2020   Procedure: CARDIOVERSION;  Surgeon: Jerline Pain, MD;  Location: [place] [facility];  Service: Cardiovascular;  Laterality: N/A;   CATARACT EXTRACTION Right    CORONARY STENT INTERVENTION N/A 12/24/2021   Procedure: CORONARY STENT INTERVENTION;  Surgeon: Nelva Bush, MD;  Location: [place] [facility];  Service: Cardiovascular;  Laterality: N/A;   LEFT HEART CATH AND CORONARY ANGIOGRAPHY N/A 12/24/2021   Procedure: LEFT HEART CATH AND CORONARY ANGIOGRAPHY;  Surgeon: Nelva Bush, MD;  Location: [place] [facility];  Service: Cardiovascular;  Laterality: N/A;   MOUTH SURGERY  2013   PERIPHERAL VASCULAR INTERVENTION Left 03/07/2021   Procedure: PERIPHERAL VASCULAR INTERVENTION;  Surgeon: Wellington Hampshire, MD;  Location: [place] [facility];  Service: Cardiovascular;  Laterality: Left;  left SFA   TEE WITHOUT CARDIOVERSION N/A 11/01/2020   Procedure:  TRANSESOPHAGEAL ECHOCARDIOGRAM (TEE);  Surgeon: Jerline Pain, MD;  Location: [facility];  Service: Cardiovascular;  Laterality: N/A;    Current Medications: Current Meds  Medication Sig   acetaminophen (TYLENOL) 325 MG tablet Take 650 mg by mouth every 6 (six) hours as needed for moderate pain or headache.   apixaban (ELIQUIS) 5 MG TABS tablet TAKE 1 TABLET BY MOUTH TWICE A DAY   bisoprolol (ZEBETA) 10 MG tablet Take 1 tablet (10 mg total)  by mouth daily.   cetirizine (ZYRTEC) 10 MG tablet Take 10 mg by mouth as needed for allergies.   clopidogrel (PLAVIX) 75 MG tablet TAKE 1 TABLET BY MOUTH EVERY DAY   dapagliflozin propanediol (FARXIGA) 10 MG TABS tablet Take 1 tablet (10 mg total) by mouth daily before breakfast. NEEDS FOLLOW UP APPOINTMENT FOR ANYMORE REFILLS   ENTRESTO 97-103 MG TAKE 1 TABLET BY MOUTH TWICE A DAY   ezetimibe (ZETIA) 10 MG tablet Take 1 tablet (10 mg total) by mouth daily.   Famotidine-Ca Carb-Mag Hydrox (PEPCID COMPLETE PO) Take 1 tablet by mouth daily as needed (acid reflux).   hydrALAZINE (APRESOLINE) 25 MG tablet TAKE 1 TABLET (25 MG) BY MOUTH IN THE MORNING AND AT BEDTIME   nitroGLYCERIN (NITROSTAT) 0.4 MG SL tablet Place 1 tablet (0.4 mg total) under the tongue every 5 (five) minutes as needed.   rosuvastatin (CRESTOR) 40 MG tablet TAKE 1 TABLET BY MOUTH EVERY DAY   SIMBRINZA 1-0.2 % SUSP Apply 1 drop to eye 2 (two) times daily.   Soft Lens Products (REWETTING DROPS) SOLN Apply 1-2 drops to eye daily as needed (dry/irritated eyes.).   spironolactone (ALDACTONE) 25 MG tablet Take 1 tablet (25 mg total) by mouth daily.     Allergies:   Beta adrenergic blockers and Tape   Social History   Socioeconomic History   Marital status: Divorced    Spouse name: Not on file   Number of children: 1   Years of education: Not on file   Highest education level: Master's degree (e.g., MA, MS, MEng, MEd, MSW, MBA)  Occupational History   Not on file  Tobacco Use   Smoking status: Some Days    Packs/day: 1.50    Years: 25.00    Total pack years: 37.50    Types: Cigarettes   Smokeless tobacco: Current    Types: Chew   Tobacco comments:    05/17/21 Ronalee Belts has cut down smoking to 1/2 to 1 ppd.  Not ready to quit.    11/22/2021 Smokes some days  Vaping Use   Vaping Use: Never used  Substance and Sexual Activity   Alcohol use: Yes    Alcohol/week: 24.0 standard drinks of alcohol    Types: 24 Cans of beer per  week    Comment: 6 cans every other day   Drug use: Yes    Frequency: 2.0 times per week    Types: Marijuana    Comment: he took Marijuana 2 weeks ago   Sexual activity: Yes    Partners: Female  Other Topics Concern   Not on file  Social History Narrative   Not on file   Social Determinants of Health   Financial Resource Strain: Low Risk  (11/01/2020)   Overall Financial Resource Strain (CARDIA)    Difficulty of Paying Living Expenses: Not hard at all  Food Insecurity: No Food Insecurity (11/01/2020)   Hunger Vital Sign    Worried About Running Out of Food in the Last Year: Never true  Ran Out of Food in the Last Year: Never true  Transportation Needs: No Transportation Needs (11/01/2020)   PRAPARE - Hydrologist (Medical): No    Lack of Transportation (Non-Medical): No  Physical Activity: Not on file  Stress: Not on file  Social Connections: Not on file     Family History: The patient's family history includes Birth defects in his paternal aunt; COPD in his maternal uncle; Gout in his brother; Hypertension in his brother; Stroke in his paternal aunt.  ROS:   Please see the history of present illness.     All other systems reviewed and are negative.  EKGs/Labs/Other Studies Reviewed:    The following studies were reviewed today:   EKG:   04/25/2022: Normal sinus rhythm, rate 67, Q waves in V1/2 12/13/2021: Sinus rhythm, rate 71, PVC, poor R wave progression  Recent Labs: 09/01/2021: ALT 32 10/11/2021: TSH 2.670 01/16/2022: BUN 15; Creatinine, Ser 1.18; Magnesium 2.2; Potassium 5.0; Sodium 138 02/25/2022: Hemoglobin 17.6; Platelet Count 156  Recent Lipid Panel    Component Value Date/Time   CHOL 185 01/16/2022 1553   TRIG 99 01/16/2022 1553   HDL 72 01/16/2022 1553   CHOLHDL 2.6 01/16/2022 1553   CHOLHDL 3.3 01/26/2021 1213   VLDL 21 01/26/2021 1213   LDLCALC 95 01/16/2022 1553    Physical Exam:    VS:  BP 115/80   Pulse 67   Ht  '6\' 2"'$  (1.88 m)   Wt 212 lb 3.2 oz (96.3 kg)   SpO2 98%   BMI 27.24 kg/m     Wt Readings from Last 3 Encounters:  04/25/22 212 lb 3.2 oz (96.3 kg)  03/20/22 211 lb 12.8 oz (96.1 kg)  02/25/22 212 lb 3.2 oz (96.3 kg)     GEN:  in no acute distress HEENT: Normal NECK: No JVD CARDIAC: RRR no murmurs RESPIRATORY:  Clear to auscultation without rales, wheezing or rhonchi  ABDOMEN: Soft, non-tender, non-distended MUSCULOSKELETAL: No edema; No deformity  SKIN: Warm and dry NEUROLOGIC:  Alert and oriented x 3 PSYCHIATRIC:  Normal affect   ASSESSMENT:    1. CAD in native artery   2. Chronic combined systolic and diastolic congestive heart failure (HCC)   3. Persistent atrial fibrillation ([place])   4. Tobacco use   5. Essential hypertension   6. PAD (peripheral artery disease) ([place])   7. Hyperlipidemia, unspecified hyperlipidemia type   8. Daytime somnolence       PLAN:    CAD: Reported exertional chest pain, coronary CTA was done on 12/04/2021, which showed obstructive CAD in mid RCA  [facility] 12/24/2021 showed severe mid RCA stenosis status post DES. -Continue Eliquis, Plavix  -Continue statin  -Continue bisoprol -Recommend cardiac rehab  Chronic combined systolic and diastolic heart failure: New diagnosis in the setting of afib w/rvr, alcohol use and heavy smoking history. 10/2020 EF 20%, BIV failure. ECHO 01/26/21 with significant improvement in EF 45-50%, normal RV function.  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 -NYHA Class II symptoms, euvolemic on exam -continue spironolactone 25 mg daily.   -continue entresto 97/103 -continue bisoprolol 10 mg daily -Continue Farxiga 10 mg daily -Continue hydralazine 25 mg twice daily -Check BMET   Persistent Afib:new diagnosis 10/2020. S/p TEE/DCCV 11/01/20.  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  Referred to EP to evaluate for ablation, saw Dr. Curt Bears and planning  ablation 05/2022  Tobacco Use: Counseled on the risks of tobacco use and cessation strongly encouraged.  Working with care guide  Polycythemia: Likely secondary to smoking. Hematology following. Jak 2 negative   Alcohol Use: Had cut back on alcohol but now drinking 2 beers per day.  Encouraged cessation  Hypertension: Continue Entresto, bisoprolol, hydralazine, and spironolactone as above  PAD: Reported severe left calf claudication with rest pain and ischemic changes in toes.  Angiography 03/2021 showed no significant aortoiliac disease on the left with distal SFA occlusion into the proximal popliteal artery, severe focal stenosis in distal right SFA.  Follows with Dr. Fletcher Anon, underwent successful angioplasty and stent placement to left SFA into proximal popliteal artery.  Postprocedure ABIs showed improvement with normal ABIs on left, mild disease (0.81) on right -Continue Eliquis -Continue rosuvastatin -Smoking cessation recommended  Hyperlipidemia: LDL 95 on 11/22/2021, on rosuvastatin 20 mg daily.  Rosuvastatin increased to 40 mg daily.  LDL remain 95 on 01/16/2022, Zetia 10 mg daily added.  Check lipid panel  Daytime somnolence: Check [facility] sleep study   RTC in 3 months   Medication Adjustments/Labs and Tests Ordered: Current medicines are reviewed at length with the patient today.  Concerns regarding medicines are outlined above.  Orders Placed This Encounter  Procedures   Lipid panel   Basic metabolic panel   AMB referral to cardiac rehabilitation   EKG 12-Lead   [facility] Sleep Study   No orders of the defined types were placed in this encounter.   Patient Instructions  Medication Instructions:  Your physician recommends that you continue on your current medications as directed. Please refer to the Current Medication list given to you today.  *If you need a refill on  your cardiac medications before your next appointment, please call your pharmacy*   Lab Work: BMET, Lipid today  If you have labs (blood work) drawn today and your tests are completely normal, you will receive your results only by: [place] (if you have MyChart) OR A paper copy in the mail If you have any lab test that is abnormal or we need to change your treatment, we will call you to review the results.   Testing/Procedures: WatchPAT?  Is a FDA cleared portable home sleep study test that uses a watch and 3 points of contact to monitor 7 different channels, including your heart rate, oxygen saturations, body position, snoring, and chest motion.  The study is easy to use from the comfort of your own home and accurately detect sleep apnea.  Before bed, you attach the chest sensor, attached the sleep apnea bracelet to your nondominant hand, and attach the finger probe.  After the study, the raw data is downloaded from the watch and scored for apnea events.   For more information: [email or web address]  Patient Testing Instructions:  Do not put battery into the device until bedtime when you are ready to begin the test. Please call the support number if you need assistance after following the instructions below: 24 hour support line- [phone] or [facility] support at [phone] (option 2)  Download the [employer] AmericanWatchPAT One" app through the google play store or App Store  Be sure to turn on or enable access to bluetooth in [place] on your smartphone/ device  Make sure no other bluetooth devices are on and within the vicinity of your smartphone/ device and WatchPAT watch during testing.  Make sure to leave your smart phone/ device plugged in and charging all night.  When ready for bed:  Follow the instructions step by step in the WatchPAT One App to activate the testing device. For additional instructions, including video instruction, visit the WatchPAT One video on  Youtube. You can search for [place] One within Youtube (video is 4 minutes and 18 seconds) or enter: https://youtube/watch?v=BCce_vbiwxE Please note: You will be prompted to enter a Pin to connect via bluetooth when starting the test. The PIN will be assigned to you when you receive the test.  The device is disposable, but it recommended that you retain the device until you receive a call letting you know the study has been received and the results have been interpreted.  We will let you know if the study did not transmit to [place] properly after the test is completed. You do not need to call us to confirm the receipt of the test.  Please complete the test within 48 hours of receiving PIN.   Frequently Asked Questions:  What is Watch [place] one?  A single use fully disposable home sleep apnea testing device and will not need to be returned after completion.  What are the requirements to use WatchPAT one?  The be able to have a successful watchpat one sleep study, you should have your Watch pat one device, your smart phone, watch pat one app, your PIN number and Internet access What type of phone do I need?  You should have a smart phone that uses Android 5.1 and above or any Iphone with IOS 10 and above How can I download the WatchPAT one app?  Based on your device type search for WatchPAT one app either in google play for android devices or APP store for Iphone's Where will I get my PIN for the study?  Your PIN will be provided by your physician's office. It is used for authentication and if you lose/forget your PIN, please reach out to your providers office.  I do not have Internet at home. Can I do WatchPAT one study?  WatchPAT One needs Internet connection throughout the night to be able to transmit the sleep data. You can use your home/local internet or your cellular's data package. However, it is always recommended to use home/local Internet. It is estimated that between 20MB-30MB will be used with  each study.However, the application will be looking for 80MB space in the phone to start the study.  What happens if I lose internet or bluetooth connection?  During the internet disconnection, your phone will not be able to transmit the sleep data. All the data, will be stored in your phone. As soon as the internet connection is back on, the phone will being sending the sleep data. During the bluetooth disconnection, WatchPAT one will not be able to to send the sleep data to your phone. Data will be kept in the [facility] one until two devices have bluetooth connection back on. As soon as the connection is back on, WatchPAT one will send the sleep data to the phone.  How long do I need to wear the WatchPAT one?  After you start the study, you should wear the device at least 6 hours.  How far should I keep my phone from the device?  During the night, your phone should be within 15 feet.  What happens if I leave the room for restroom or other reasons?  Leaving the room for any reason will not cause any problem. As soon as your get back to the room, both devices will reconnect and will continue to send the  sleep data. Can I use my phone during the sleep study?  Yes, you can use your phone as usual during the study. But it is recommended to put your watchpat one on when you are ready to go to bed.  How will I get my study results?  A soon as you completed your study, your sleep data will be sent to the provider. They will then share the results with you when they are ready.    Follow-Up: At [facility], you and your health needs are our priority.  As part of our continuing mission to provide you with exceptional heart care, we have created designated Provider Care Teams.  These Care Teams include your primary Cardiologist (physician) and Advanced Practice Providers (APPs -  Physician Assistants and Nurse Practitioners) who all work together to provide you with the care you need, when you need  it.  We recommend signing up for the patient portal called "MyChart".  Sign up information is provided on this After Visit Summary.  MyChart is used to connect with patients for Virtual Visits (Telemedicine).  Patients are able to view lab/test results, encounter notes, upcoming appointments, etc.  Non-urgent messages can be sent to your provider as well.   To learn more about what you can do with MyChart, go to [email or web address].    Your next appointment:   3 month(s)  The format for your next appointment:   In Person  Provider:   [name], MD     Other Instructions You have been referred to: Cardiac Rehab        ]   Signed, [name], MD  04/25/2022 6:37 PM    [place]

## 2022-04-22 NOTE — Telephone Encounter (Signed)
Refill request

## 2022-04-24 ENCOUNTER — Ambulatory Visit (INDEPENDENT_AMBULATORY_CARE_PROVIDER_SITE_OTHER): Payer: 59 | Admitting: Psychologist

## 2022-04-24 DIAGNOSIS — F411 Generalized anxiety disorder: Secondary | ICD-10-CM

## 2022-04-24 NOTE — Progress Notes (Signed)
                Mose Colaizzi, PsyD 

## 2022-04-24 NOTE — Progress Notes (Signed)
[facility] Counselor/Therapist Progress Note  Patient ID: Christopher Gutierrez, MRN: [id number],    Date: 04/24/2022  Time Spent: 02:05 pm to 02:40 pm; total time: 35 minutes   This session was held via in person. The patient consented to in-person therapy and was in the clinician's office. Limits of confidentiality were discussed with the patient.   Treatment Type: Individual Therapy  Reported Symptoms: NA  Mental Status Exam: Appearance:  Well Groomed     Behavior: Appropriate  Motor: Normal  Speech/Language:  Slow  Affect: Flat  Mood: constricted  Thought process: normal  Thought content:   WNL  Sensory/Perceptual disturbances:   WNL  Orientation: oriented to person, place, and time/date  Attention: Good  Concentration: Good  Memory: WNL  Fund of knowledge:  Good  Insight:   Poor  Judgment:  Fair  Impulse Control: Good   Risk Assessment: Danger to Self:  No Self-injurious Behavior: No Danger to Others: No Duty to Warn:no Physical Aggression / Violence:No  Access to Firearms a concern: No  Gang Involvement:No   Subjective: Beginning the session, patient described the mindfulness exercise as helpful. After reviewing the treatment plan, patient stated that he had a difficult time remembering to practice the exercise. He spent time reflecting on ways to remind self to practice. From there, he asked for a couple of additional coping techniques. After practicing them, he described himself as better. He was agreeable to the homework and following up. He denied suicidal and homicidal ideation.    Interventions:  Worked on developing a therapeutic relationship with the patient using active listening and reflective statements. Provided emotional support using empathy and validation. Reviewed the treatment plan with the patient. Praised the patient for doing slightly better. Explored barriers to mindfulness. Attempted to assist with problem solving. Identified  goals for the session. Provided psychoeducation about defusion and guided imagery. Practiced and processed patient's experience. Praised patient for experiencing less distress. Assisted in problem solving. Assigned homework. Assessed for suicidal and homicidal ideation.   Homework: Implement additional coping strategies  Next Session: Review homework. Process what patient can do. Emotional support.   Diagnosis: F41.1 generalized anxiety disorder  Plan:  Goals Work through the grieving process and face reality of own death Accept emotional support from others around them Live life to the fullest, event though time may be limited Become as knowledgeable about the medical condition  Reduce fear, anxiety about the health condition  Accept the illness Accept the role of psychological and behavioral factors  Stabilize anxiety level wile increasing ability to function Learn and implement coping skills that result in a reduction of anxiety  Alleviate depressive symptoms Recognize, accept, and cope with depressive feelings Develop healthy thinking patterns Develop healthy interpersonal relationships  Objectives target date for all objectives is 03/20/2023 Identify feelings associated with the illness Family members share with each other feelings Identify the losses or limitations that have been experienced Verbalize acceptance of the reality of the medical condition Commit to learning and implement a proactive approach to managing personal stresses Verbalize an understanding of the medical condition Work with therapist to develop a plan for coping with stress Learn and implement skills for managing stress Engage in social, productive activities that are possible Engage in faith based activities implement positive imagery Identify coping skills and sources of emotional support Patient's partner and family members verbalize their fears regarding severity of health condition Identify sources  of emotional distress  Learning and implement calming skills to reduce overall  anxiety Learn and implement problem solving strategies Identify and engage in pleasant activities Learning and implement personal and interpersonal skills to reduce anxiety and improve interpersonal relationships Learn to accept limitations in life and commit to tolerating, rather than avoiding, unpleasant emotions while accomplishing meaningful goals Identify major life conflicts from the past and present that form the basis for present anxiety Learn and implement behavioral strategies Verbalize an understanding and resolution of current interpersonal problems Learn and implement problem solving and decision making skills Learn and implement conflict resolution skills to resolve interpersonal problems Verbalize an understanding of healthy and unhealthy emotions verbalize insight into how past relationships may be influence current experiences with depression Use mindfulness and acceptance strategies and increase value based behavior  Increase hopeful statements about the future.   Interventions Teach about stress and ways to handle stress Assist the patient in developing a coping action plan for stressors Conduct skills based training for coping strategies Train problem focused skills Sort out what activities the individual can do Encourage patient to rely upon his/her spiritual faith Teach the patient to use guided imagery Probe and evaluate family's ability to provide emotional support Allow family to share their fears Assist the patient in identifying, sorting through, and verbalizing the various feelings generated by his/her medical condition Meet with family members  Ask patient list out limitations  Use stress inoculation training  Use Acceptance and Commitment Therapy to help client accept uncomfortable realities in order to accomplish value-consistent goals Reinforce the client's insight into the  role of his/her past emotional pain and present anxiety  Discuss examples demonstrating that unrealistic worry overestimates the probability of threats and underestimate patient's ability  Assist the patient in analyzing his or her worries Help patient understand that avoidance is reinforcing  Behavioral activation help the client explore the relationship, nature of the dispute,  Help the client develop new interpersonal skills and relationships Conduct Problem so living therapy Teach conflict resolution skills Use a process-experiential approach Conduct TLDP Conduct ACT  The patient and clinician reviewed the treatment plan on 04/24/2022. The patient approved of the treatment plan.   Conception Chancy, PsyD

## 2022-04-25 ENCOUNTER — Ambulatory Visit: Payer: 59 | Attending: Cardiology | Admitting: Cardiology

## 2022-04-25 ENCOUNTER — Encounter: Payer: Self-pay | Admitting: Cardiology

## 2022-04-25 ENCOUNTER — Ambulatory Visit: Payer: 59

## 2022-04-25 VITALS — BP 115/80 | HR 67 | Ht 74.0 in | Wt 212.2 lb

## 2022-04-25 DIAGNOSIS — R400 Somnolence: Secondary | ICD-10-CM

## 2022-04-25 DIAGNOSIS — I5042 Chronic combined systolic (congestive) and diastolic (congestive) heart failure: Secondary | ICD-10-CM

## 2022-04-25 DIAGNOSIS — Z720 Tobacco use: Secondary | ICD-10-CM

## 2022-04-25 DIAGNOSIS — E785 Hyperlipidemia, unspecified: Secondary | ICD-10-CM

## 2022-04-25 DIAGNOSIS — I2510 Atherosclerotic heart disease of native coronary artery without angina pectoris: Secondary | ICD-10-CM

## 2022-04-25 DIAGNOSIS — I4819 Other persistent atrial fibrillation: Secondary | ICD-10-CM | POA: Diagnosis not present

## 2022-04-25 DIAGNOSIS — I10 Essential (primary) hypertension: Secondary | ICD-10-CM | POA: Diagnosis not present

## 2022-04-25 DIAGNOSIS — I739 Peripheral vascular disease, unspecified: Secondary | ICD-10-CM

## 2022-04-25 NOTE — Patient Instructions (Signed)
Medication Instructions:  Your physician recommends that you continue on your current medications as directed. Please refer to the Current Medication list given to you today.  *If you need a refill on your cardiac medications before your next appointment, please call your pharmacy*   Lab Work: BMET, Lipid today  If you have labs (blood work) drawn today and your tests are completely normal, you will receive your results only by: [place] (if you have MyChart) OR A paper copy in the mail If you have any lab test that is abnormal or we need to change your treatment, we will call you to review the results.   Testing/Procedures: WatchPAT?  Is a FDA cleared portable home sleep study test that uses a watch and 3 points of contact to monitor 7 different channels, including your heart rate, oxygen saturations, body position, snoring, and chest motion.  The study is easy to use from the comfort of your own home and accurately detect sleep apnea.  Before bed, you attach the chest sensor, attached the sleep apnea bracelet to your nondominant hand, and attach the finger probe.  After the study, the raw data is downloaded from the watch and scored for apnea events.   For more information: [email or web address]  Patient Testing Instructions:  Do not put battery into the device until bedtime when you are ready to begin the test. Please call the support number if you need assistance after following the instructions below: 24 hour support line- [phone] or ITAMAR support at [phone] (option 2)  Download the [employer] One" app through the google play store or App Store  Be sure to turn on or enable access to bluetooth in settlings on your smartphone/ device  Make sure no other bluetooth devices are on and within the vicinity of your smartphone/ device and WatchPAT watch during testing.  Make sure to leave your smart phone/ device plugged in and charging all night.   When ready for bed:  Follow the instructions step by step in the WatchPAT One App to activate the testing device. For additional instructions, including video instruction, visit the WatchPAT One video on Youtube. You can search for [place] One within Youtube (video is 4 minutes and 18 seconds) or enter: https://youtube/watch?[email or web address]_vbiwxE Please note: You will be prompted to enter a Pin to connect via bluetooth when starting the test. The PIN will be assigned to you when you receive the test.  The device is disposable, but it recommended that you retain the device until you receive a call letting you know the study has been received and the results have been interpreted.  We will let you know if the study did not transmit to [place] properly after the test is completed. You do not need to call us to confirm the receipt of the test.  Please complete the test within 48 hours of receiving PIN.   Frequently Asked Questions:  What is Watch [place] one?  A single use fully disposable home sleep apnea testing device and will not need to be returned after completion.  What are the requirements to use WatchPAT one?  The be able to have a successful watchpat one sleep study, you should have your Watch pat one device, your smart phone, watch pat one app, your PIN number and Internet access What type of phone do I need?  You should have a smart phone that uses Android 5.1 and above or any Iphone with IOS 10 and above How can I download the WatchPAT one  app?  Based on your device type search for WatchPAT one app either in google play for android devices or APP store for Iphone's Where will I get my PIN for the study?  Your PIN will be provided by your physician's office. It is used for authentication and if you lose/forget your PIN, please reach out to your providers office.  I do not have Internet at home. Can I do WatchPAT one study?  WatchPAT One needs Internet connection throughout the night to be able to  transmit the sleep data. You can use your home/local internet or your cellular's data package. However, it is always recommended to use home/local Internet. It is estimated that between 20MB-30MB will be used with each study.However, the application will be looking for 80MB space in the phone to start the study.  What happens if I lose internet or bluetooth connection?  During the internet disconnection, your phone will not be able to transmit the sleep data. All the data, will be stored in your phone. As soon as the internet connection is back on, the phone will being sending the sleep data. During the bluetooth disconnection, WatchPAT one will not be able to to send the sleep data to your phone. Data will be kept in the [facility] one until two devices have bluetooth connection back on. As soon as the connection is back on, WatchPAT one will send the sleep data to the phone.  How long do I need to wear the WatchPAT one?  After you start the study, you should wear the device at least 6 hours.  How far should I keep my phone from the device?  During the night, your phone should be within 15 feet.  What happens if I leave the room for restroom or other reasons?  Leaving the room for any reason will not cause any problem. As soon as your get back to the room, both devices will reconnect and will continue to send the sleep data. Can I use my phone during the sleep study?  Yes, you can use your phone as usual during the study. But it is recommended to put your watchpat one on when you are ready to go to bed.  How will I get my study results?  A soon as you completed your study, your sleep data will be sent to the provider. They will then share the results with you when they are ready.    Follow-Up: At [facility], you and your health needs are our priority.  As part of our continuing mission to provide you with exceptional heart care, we have created designated Provider Care Teams.  These Care  Teams include your primary Cardiologist (physician) and Advanced Practice Providers (APPs -  Physician Assistants and Nurse Practitioners) who all work together to provide you with the care you need, when you need it.  We recommend signing up for the patient portal called "MyChart".  Sign up information is provided on this After Visit Summary.  MyChart is used to connect with patients for Virtual Visits (Telemedicine).  Patients are able to view lab/test results, encounter notes, upcoming appointments, etc.  Non-urgent messages can be sent to your provider as well.   To learn more about what you can do with MyChart, go to [email or web address].    Your next appointment:   3 month(s)  The format for your next appointment:   In Person  Provider:   [name], MD     Other Instructions You have  been referred to: Cardiac Rehab        ]

## 2022-04-26 ENCOUNTER — Telehealth: Payer: Self-pay | Admitting: *Deleted

## 2022-04-26 ENCOUNTER — Ambulatory Visit: Payer: 59

## 2022-04-26 ENCOUNTER — Telehealth: Payer: Self-pay

## 2022-04-26 DIAGNOSIS — Z0000 Encounter for general adult medical examination without abnormal findings: Secondary | ICD-10-CM

## 2022-04-26 LAB — BASIC METABOLIC PANEL
BUN/Creatinine Ratio: 12 (ref 9–20)
BUN: 15 mg/dL (ref 6–24)
CO2: 20 mmol/L (ref 20–29)
Calcium: 9.1 mg/dL (ref 8.7–10.2)
Chloride: 104 mmol/L (ref 96–106)
Creatinine, Ser: 1.26 mg/dL (ref 0.76–1.27)
Glucose: 106 mg/dL — ABNORMAL HIGH (ref 70–99)
Potassium: 4.8 mmol/L (ref 3.5–5.2)
Sodium: 140 mmol/L (ref 134–144)
eGFR: 66 mL/min/{1.73_m2} (ref >59)

## 2022-04-26 LAB — LIPID PANEL
Chol/HDL Ratio: 2.2 ratio (ref 0.0–5.0)
Cholesterol, Total: 143 mg/dL (ref 100–199)
HDL: 65 mg/dL (ref >39)
LDL Chol Calc (NIH): 59 mg/dL (ref 0–99)
Triglycerides: 102 mg/dL (ref 0–149)
VLDL Cholesterol Cal: 19 mg/dL (ref 5–40)

## 2022-04-26 NOTE — Telephone Encounter (Signed)
Called and made the patient aware that He may proceed with the [facility] Sleep Study. PIN # provided to the patient. Patient made aware that He will be contacted after the test has been read with the results and any recommendations. Patient verbalized understanding and thanked me for the call.

## 2022-04-26 NOTE — Progress Notes (Unsigned)
Appointment Outcome: Completed, Session #: 8 Start time: 2:01pm   End time: 2:37pm   Total Mins: 36 minutes  AGREEMENTS SECTION   Overall Goal(s): Smoking cessation - Quit Date (TBD) Stress management         Reduce sodium intake                                            Agreement/Action Steps:  Smoking Cessation Limit to 10 cigarettes per day 5 cigarettes in the morning and 5 cigarettes in the evening Practice mini quits by engaging in various activities throughout the day Make a list of activities to engage in or tasks to complete   Stress management Read daily Gardening or walk early mornings Practice deep breathing Implement positive self-talk Continue counseling sessions as scheduled   Reduce Sodium Intake Read food labels to monitor consumption of 2,'000mg'$ /sodium per day Practice portion control Avoid using salt while cooking Monitor frozen meal consumption Reduce frequency of eating out Choose healthier options when eating outside of home (e.g., chicken instead of burger) Ask food to not be seasoned with salt Drink water with minimum amount of sodium  Progress Notes:  Patient stated that during his appointment with Dr. Gardiner Rhyme the day before helped him realize the reality of needing to quit smoking and how it is affecting his health. Patient stated that since it has been made clear the need to stop, he knows that what he has been told is tight and is more motivated to quit smoking. Patient stated that he has set a quit date of October 1st and will work to decrease his smoking more and quit cold [place] on that day. Patient mentioned that if he has issues with managing his urges to smoke, he will start using the nicotine patches and/or gum that he already has at home.   Patient reported that the shortest time that he goes without smoking is about 1 hour after eating and the maximum time is 4 hours when he finds himself busy. Patient stated that he has been able to extend  the time he waits to smoke after eating. Patient shared that one thing that will help him cut back on smoking is not having cigarettes with him when he is driving. Patient stated that he mostly smokes out of boredom when driving and because of stress. Patient stated that he smokes about 6 cigarettes on a good day and 15 on days when he is more stressed. Patient reported that he still smokes on average 10 cigarettes per on a normal day.   Patient mentioned that he only had 5 cigarettes so far today because he has been working on his stress management techniques and staying busy. Patient stated that he has been engage in doing more house chores and being outside to clean the car, and anything that will get him out of the house and on his feet such a grocery shopping. Patient stated that he has been doing this instead of taking walks in the morning. Patient shared that he has been practicing deep breathing along with meditation that was recommended by his therapist. Patient stated that meditating has helped his not dwell on things and overthink but allows him a chance to clear his mind. Patient stated that his blood pressure readings have improved. Patient shared that he continues to read daily and implement positive self-talk to help reduce stress.  Patient stated that he continues to read food labels for a general idea of the sodium in the foods he eats, but don't trust the label. Patient stated that he practices portion control, and he tends to eat light. Patient shared the example of being able to refrain from eating several chocolate chip cookies and instead eating what is recommended on the label for one serving. Patient stated that he continues to manage his sodium intake by not using salt when he cooks at home.   Patient mentioned that he has reduced eating out and has gone to [employer] once for his birthday. Patient stated that he will be going out tomorrow night and he knows that the restaurant doesn't  have the healthiest food. Patient shared that it's difficult to manage sodium consumption when he eats outside of his home. Patient shared that he may go over his sodium consumption on days that he eats frozen meals depending on other foods he ate that day such as snacks.   Patient stated during this session that he realizes that he has a long way to go with improving his health behaviors that he has chosen to work on. Patient expressed that he must stick with these changes as a lifestyle change and not just for a few days.    Indicators of Success and Accountability:  Patient stated that managing stress, which affects his smoking has improved.  Readiness: Patient is in the action stage of smoking cessation, stress management, and reducing sodium intake. Strengths and Supports: Patient is being supported by family and friends. Patient stated that he is trying to be optimistic.  Challenges and Barriers: Patient does not foresee any challenges to implementing action steps over the next two weeks.   Coaching Outcomes: Discussed with patient to consider the amount of sodium is in his sandwiches that he eats based on all the ingredients he adds including the sodium in the bread.   Patient stated that when he eats outside of his home that he will have to focus on choosing the healthiest options and informing his waiter how to prepare his food. Patient mentioned that he will also work on avoiding eating foods that he typically would that might carry him over his sodium consumption goal for the day.   Discussed with patient how to read a food label properly to determine how much sodium is in a one serving compared to the entire contents of a package of food and continuing to practice portion control via the serving sizes on the label.   Patient revised his action steps towards his goals over the next two weeks as outlined below.   Patient stated that he has some things that are coming up that he is  trying not to over think, which would normally cause him to smoke more.   Patient is wanting to be smoke free before his procedure on October 12th. Patient set a quit date of May 05, 2022. Patient will use nicotine patches and/or gum to help manage the urge to smoke after quitting smoking.   Patient will not take cigarettes with him when driving prior to quitting to help him cut back on smoking. Patient will reduce his smoking over the next few days before quitting using his previous action steps.     Overall Goal(s): Smoking cessation - Quit Date (May 05, 2022) Stress management         Reduce sodium intake  Agreement/Action Steps:  Smoking Cessation Continue to reduce cigarettes smoke daily from 10 per day until October 1 using previous smoking cessation steps Don't take cigarettes when out driving Use nicotine patches/lozenges if having urges to smoke starting October 1  Stress management Read daily Engage in various chores/activities Practice mediation/deep breathing Implement positive self-talk Continue counseling sessions as scheduled  Reduce Sodium Intake Read food labels to monitor consumption of 2,'000mg'$ /sodium per day Practice portion control Avoid using salt while cooking Monitor frozen meal consumption Reduce frequency of eating out Choose healthier options when eating outside of home (e.g., chicken instead of burger) Ask food to not be seasoned with salt Drink water with minimum amount of sodium    Attempted: Fulfilled - Patient is practicing mini quits and engaging in activities to deter smoking. Patient continues to implement his action steps to manage stress and reduce his sodium consumption.  Partial - Patient is smoking an average of 6-15 cigarettes per day.    Not attempted: Dropped/Revised - Patient decided not to walk or garden in the mornings and has opted to engage in more house chores, outdoor  activities such as cleaning the car, and walking in stores.

## 2022-04-26 NOTE — Telephone Encounter (Signed)
Staff message sent to Stacy Green ok to activate itamar device.  

## 2022-05-03 ENCOUNTER — Ambulatory Visit: Payer: 59 | Attending: Cardiology

## 2022-05-03 DIAGNOSIS — I4819 Other persistent atrial fibrillation: Secondary | ICD-10-CM

## 2022-05-03 DIAGNOSIS — Z01812 Encounter for preprocedural laboratory examination: Secondary | ICD-10-CM

## 2022-05-04 LAB — BASIC METABOLIC PANEL
BUN/Creatinine Ratio: 13 (ref 9–20)
BUN: 15 mg/dL (ref 6–24)
CO2: 17 mmol/L — ABNORMAL LOW (ref 20–29)
Calcium: 9.2 mg/dL (ref 8.7–10.2)
Chloride: 104 mmol/L (ref 96–106)
Creatinine, Ser: 1.19 mg/dL (ref 0.76–1.27)
Glucose: 93 mg/dL (ref 70–99)
Potassium: 4.6 mmol/L (ref 3.5–5.2)
Sodium: 143 mmol/L (ref 134–144)
eGFR: 70 mL/min/{1.73_m2} (ref >59)

## 2022-05-04 LAB — CBC
Hematocrit: 51.9 % — ABNORMAL HIGH (ref 37.5–51.0)
Hemoglobin: 17.7 g/dL (ref 13.0–17.7)
MCH: 33.8 pg — ABNORMAL HIGH (ref 26.6–33.0)
MCHC: 34.1 g/dL (ref 31.5–35.7)
MCV: 99 fL — ABNORMAL HIGH (ref 79–97)
Platelets: 176 10*3/uL (ref 150–450)
RBC: 5.23 x10E6/uL (ref 4.14–5.80)
RDW: 11.7 % (ref 11.6–15.4)
WBC: 11.8 10*3/uL — ABNORMAL HIGH (ref 3.4–10.8)

## 2022-05-08 ENCOUNTER — Telehealth (HOSPITAL_COMMUNITY): Payer: Self-pay | Admitting: *Deleted

## 2022-05-08 ENCOUNTER — Telehealth (HOSPITAL_COMMUNITY): Payer: Self-pay | Admitting: Emergency Medicine

## 2022-05-08 NOTE — Telephone Encounter (Signed)
Patient returning call regarding upcoming cardiac imaging study; pt verbalizes understanding of appt date/time, parking situation and where to check in, and verified current allergies; name and call back number provided for further questions should they arise  Gordy Clement RN Navigator Cardiac Imaging [facility] [phone] office [phone] cell  Patient aware to arrive at 1 pm for his CT scan.

## 2022-05-08 NOTE — Telephone Encounter (Signed)
Attempted to call patient regarding upcoming cardiac CT appointment. °Left message on voicemail with name and callback number °Bryanna Yim RN Navigator Cardiac Imaging °[facility] °[phone] Office °[phone] Cell ° °

## 2022-05-09 ENCOUNTER — Ambulatory Visit (HOSPITAL_COMMUNITY)
Admission: RE | Admit: 2022-05-09 | Discharge: 2022-05-09 | Disposition: A | Discharge status: Home / Self Care | Payer: 59 | Source: Ambulatory Visit | Attending: Cardiology | Admitting: Cardiology

## 2022-05-09 DIAGNOSIS — I4819 Other persistent atrial fibrillation: Secondary | ICD-10-CM | POA: Diagnosis not present

## 2022-05-09 MED ORDER — IOHEXOL 350 MG/ML SOLN
100.0000 mL | Freq: Once | INTRAVENOUS | Status: AC | PRN
  Administered 2022-05-09: 100 mL via INTRAVENOUS

## 2022-05-13 ENCOUNTER — Ambulatory Visit: Payer: 59 | Attending: Cardiovascular Disease

## 2022-05-13 DIAGNOSIS — Z0000 Encounter for general adult medical examination without abnormal findings: Secondary | ICD-10-CM

## 2022-05-13 NOTE — Progress Notes (Unsigned)
Appointment Outcome: Completed, Session #: 9                        Start time: 2:01pm   End time: 2:38pm   Total Mins: 37 minutes  AGREEMENTS SECTION   Overall Goal(s): Smoking cessation - Quit Date (May 05, 2022) Stress management         Reduce sodium intake                                            Agreement/Action Steps:  Smoking Cessation Continue to reduce cigarettes smoke daily from 10 per day until October 1 using previous smoking cessation steps Don't take cigarettes when out driving Use nicotine patches/lozenges if having urges to smoke starting October 1   Stress management Read daily Engage in various chores/activities Practice mediation/deep breathing Implement positive self-talk Continue counseling sessions as scheduled   Reduce Sodium Intake Read food labels to monitor consumption of 2,076m/sodium per day Practice portion control Avoid using salt while cooking Monitor frozen meal consumption Reduce frequency of eating out Choose healthier options when eating outside of home (e.g., chicken instead of burger) Ask food to not be seasoned with salt Drink water with minimum amount of sodium  Progress Notes:  Patient reported that he was able to reduce his smoking to 2-3 cigarettes until the evening after dinner for 3 days last week. Patient shared that on other days he smoked 10 or more cigarettes because he is worried about his upcoming procedure. Patient stated that on the days that he was able to reduce his smoking was due to talking himself through urges or whenever he thought about smoking. Patient mentioned that he has not tried using the nicotine patches or lozenges to aid in smoking cessation. Patient stated that he knows he needs to quit smoking and it must be done. Patient wants to stop smoking the night before his procedure on Oct 12th and maintain not smoking after coming home.   Patient continues his normal routine to calm and distract himself from  smoking by engaging in reading, and various activities, such as house chores and working on the heating system to prepare for the upcoming season. Patient shared that he has not talked to his counselor since the last health coaching appointment, but has a session scheduled for next week. Patient reported that he does not engage in much positive self-talk because he is always thinking about what's next or what he hasn't done.   Patient stated that he meditates when he remembers but finds himself stressed after being in that state for some time. Patient shared that when he does meditate, he is able to calm himself. Patient stated that he needs to realize quicker that he's getting stressed or is in that state so that he can avoid having a late response/reaction or smoking out of habit before he realizes it. Patient mentioned that he is supposed to take an at-home sleep apnea test that has caused him anxiety.   Patient shared that he has eating fast-food twice in the past two weeks from [employer]. Patient stated that he did not get fries with his chicken sandwich. Patient stated that he did not check to see if they had a grilled option for the chicken sandwich or ask that his food is not salted. Patient reported that the restaurant is new and  close by his home, and he had to check out what the buzz has been about the sandwich. Patient mentioned that he did not visit any other fast-food restaurants and during this time he did not eat a frozen meal on the days he ate from [employer].   Patient stated that he still read food labels to monitor his sodium consumption on foods that had labels. Patient stated that he continues to not use salt when he is cooking. Patient explained that due to being stressed he has been eating less in addition to practicing portion control. Patient reported that his cholesterol has improved in addition to his blood pressure. Patient mentioned that with these improvements and change in  weather, he doesn't find himself as thirsty and do no consume as much water as he did previously. Patient shared that he drinks throughout the day, but he also consumes other beverages.    Indicators of Success and Accountability:  Patient has been able to maintain his eating habits over the past two weeks.  Readiness: Patient is in the action stage of smoking cessation, stress management, and reducing his sodium intake.  Strengths and Supports: Patient is being supported by his therapist. Patient is reflecting on his behavior and is determined to reach his goals.  Challenges and Barriers: Patient is stressed about his upcoming procedure which contributes to his smoking.    Coaching Outcomes: Discussed with patient that he was able to quit previously and asked what he can draw from that experience to aid him in maintaining smoking cessation after his procedure. Patient will think over this question but stated that it just must be done.   Reframed with patient the way that he views positive self-talk and asked how he can utilize the questions that he typically thinks to himself as a positive tool to encourage himself of the progress that he can make moving forward towards his goal.  Discussed with patient the importance of taking the at-home sleep apnea test and how procrastination can contribute to his anxiety. Patient stated that prolonging the test is making him worried because he is concerned about the results.   Patient will aim to not smoke past midnight on October 11th unless otherwise advised to stop smoking per pre-procedure instructions for Oct 12th. Patient will discuss with provider if using nicotine patches or lozenges were appropriate for him after his procedure.   Discussed transportation options with patient due to not having a support person to drive him to and from the hospital. Patient declined transportation assistance or calling the number on the back of his insurance card.  Patient stated that he prefers to get a cab after driving himself to the hospital. Patient stated that he was going to call to determine what time he must be present for his procedure.    Attempted: Fulfilled - Patient continues to read daily, engage in house chores preparing for the season, and attending counseling. Patient continues to read food labels to monitor sodium intake. Patient practices portion control, does not use salt when cooking, minimize his frozen meal consumption, and reduced the frequency of eating fast food.  Partial - Patient was able to maintain smoking 10 or less cigarettes at least 3 days per week. Patient engages in meditation/deep breathing when he remembers to do so when stressed. Patient is drinking water but not much in volume due to not being thirsty.  Not met - Patient did not meet his quit date. Patient has not used nicotine patches or lozenges  to aid in smoking cessation. Patient did not implement positive self-talk.

## 2022-05-14 ENCOUNTER — Encounter: Payer: Self-pay | Admitting: Cardiology

## 2022-05-14 NOTE — Telephone Encounter (Signed)
Reviewed procedure instructions with patient again. Also informed that I would resend via mychart for him. Pt reports he does NOT have anyone to stay with him post ablation, so he will need to stay overnight. Cath lab made aware. Pt appreciates my call back

## 2022-05-15 NOTE — Anesthesia Preprocedure Evaluation (Addendum)
Anesthesia Evaluation  Patient identified by MRN, date of birth, ID band Patient awake    Reviewed: Allergy & Precautions, H&P , NPO status , Patient's Chart, lab work & pertinent test results  Airway Mallampati: II  TM Distance: >3 FB Neck ROM: Full    Dental no notable dental hx. (+) Poor Dentition, Dental Advisory Given   Pulmonary Current SmokerPatient did not abstain from smoking.,    Pulmonary exam normal        Cardiovascular hypertension, Pt. on medications and Pt. on home beta blockers + CAD, + Cardiac Stents and +CHF  Normal cardiovascular exam+ dysrhythmias Atrial Fibrillation   Cath 12/2020 Conclusions: 1. Severe single-vessel coronary artery disease with 80-90% mid RCA stenosis.  There is mild-moderate, nonobstructive disease involving the left coronary artery. 2. Low normal left ventricular systolic function (LVEF 81-82%) with upper normal filling pressure (LVEDP 15 mmHg). 3. Successful PCI to mid RCA using Synergy 2.75 x 20 mm drug-eluting stent (postdilated to 3.3 mm) with 0% residual stenosis and TIMI-3 flow.  Recommendations: 1. Admit for overnight observation given lack of transportation to accommodate same-day discharge post PCI. 2. If no evidence of bleeding or vascular injury, restart apixaban tomorrow morning.  Recommend concurrent apixaban and clopidogrel for at least 6 months.  Aspirin can be held after discharge if the patient remains on apixaban and clopidogrel. 3. Continue secondary prevention of coronary artery disease and goal-directed medical therapy for management of chronic HFrEF with recovered ejection fraction.    Neuro/Psych negative neurological ROS  negative psych ROS   GI/Hepatic negative GI ROS, Neg liver ROS,   Endo/Other  negative endocrine ROS  Renal/GU negative Renal ROS  negative genitourinary   Musculoskeletal  (+) Arthritis , Osteoarthritis,    Abdominal   Peds   Hematology negative hematology ROS (+)   Anesthesia Other Findings   Reproductive/Obstetrics negative OB ROS                            Anesthesia Physical  Anesthesia Plan  ASA: 3  Anesthesia Plan: General   Post-op Pain Management: Minimal or no pain anticipated   Induction: Intravenous  PONV Risk Score and Plan: 1 and Propofol infusion and Ondansetron  Airway Management Planned: Oral ETT  Additional Equipment:   Intra-op Plan:   Post-operative Plan: Extubation in OR  Informed Consent: I have reviewed the patients History and Physical, chart, labs and discussed the procedure including the risks, benefits and alternatives for the proposed anesthesia with the patient or authorized representative who has indicated his/her understanding and acceptance.     Dental advisory given  Plan Discussed with: Anesthesiologist and CRNA  Anesthesia Plan Comments:       Anesthesia Quick Evaluation

## 2022-05-15 NOTE — Pre-Procedure Instructions (Signed)
Instructed patient on the following items: Arrival time 0830 Nothing to eat or drink after midnight No meds AM of procedure Responsible person to drive you home and stay with you for 24 hrs- Pt doesn't have anyone to stay over night with him, he will be admitted.  Have you missed any doses of anti-coagulant ELiquis- hasn't missed any doses

## 2022-05-16 ENCOUNTER — Ambulatory Visit (HOSPITAL_COMMUNITY)
Admission: RE | Admit: 2022-05-16 | Discharge: 2022-05-17 | Disposition: A | Discharge status: Home / Self Care | Payer: 59 | Source: Ambulatory Visit | Attending: Cardiology | Admitting: Cardiology

## 2022-05-16 ENCOUNTER — Ambulatory Visit (HOSPITAL_COMMUNITY): Payer: 59 | Admitting: Certified Registered"

## 2022-05-16 ENCOUNTER — Ambulatory Visit (HOSPITAL_COMMUNITY)
Admission: RE | Disposition: A | Discharge status: Home / Self Care | Payer: 59 | Source: Ambulatory Visit | Attending: Cardiology

## 2022-05-16 ENCOUNTER — Ambulatory Visit (HOSPITAL_BASED_OUTPATIENT_CLINIC_OR_DEPARTMENT_OTHER): Payer: 59 | Admitting: Certified Registered"

## 2022-05-16 ENCOUNTER — Other Ambulatory Visit: Payer: Self-pay

## 2022-05-16 DIAGNOSIS — I2510 Atherosclerotic heart disease of native coronary artery without angina pectoris: Secondary | ICD-10-CM | POA: Diagnosis not present

## 2022-05-16 DIAGNOSIS — I509 Heart failure, unspecified: Secondary | ICD-10-CM | POA: Diagnosis not present

## 2022-05-16 DIAGNOSIS — I4819 Other persistent atrial fibrillation: Secondary | ICD-10-CM

## 2022-05-16 DIAGNOSIS — I4891 Unspecified atrial fibrillation: Secondary | ICD-10-CM

## 2022-05-16 DIAGNOSIS — Z7901 Long term (current) use of anticoagulants: Secondary | ICD-10-CM | POA: Diagnosis not present

## 2022-05-16 DIAGNOSIS — I110 Hypertensive heart disease with heart failure: Secondary | ICD-10-CM | POA: Diagnosis not present

## 2022-05-16 DIAGNOSIS — I739 Peripheral vascular disease, unspecified: Secondary | ICD-10-CM | POA: Insufficient documentation

## 2022-05-16 DIAGNOSIS — Z955 Presence of coronary angioplasty implant and graft: Secondary | ICD-10-CM | POA: Insufficient documentation

## 2022-05-16 DIAGNOSIS — F17210 Nicotine dependence, cigarettes, uncomplicated: Secondary | ICD-10-CM | POA: Diagnosis not present

## 2022-05-16 DIAGNOSIS — I4892 Unspecified atrial flutter: Secondary | ICD-10-CM | POA: Diagnosis not present

## 2022-05-16 HISTORY — PX: ATRIAL FIBRILLATION ABLATION: EP1191

## 2022-05-16 LAB — POCT ACTIVATED CLOTTING TIME: Activated Clotting Time: 426 seconds

## 2022-05-16 SURGERY — ATRIAL FIBRILLATION ABLATION
Anesthesia: General

## 2022-05-16 MED ORDER — ONDANSETRON HCL 4 MG/2ML IJ SOLN: 4.0000 mg | Freq: Four times a day (QID) | INTRAMUSCULAR | Status: DC | PRN

## 2022-05-16 MED ORDER — ACETAMINOPHEN 500 MG PO TABS
ORAL_TABLET | ORAL | Status: AC
  Filled 2022-05-16: qty 1

## 2022-05-16 MED ORDER — HEPARIN SODIUM (PORCINE) 1000 UNIT/ML IJ SOLN
INTRAMUSCULAR | Status: AC
  Filled 2022-05-16: qty 10

## 2022-05-16 MED ORDER — HEPARIN (PORCINE) IN NACL 1000-0.9 UT/500ML-% IV SOLN
INTRAVENOUS | Status: AC
  Filled 2022-05-16: qty 500

## 2022-05-16 MED ORDER — ONDANSETRON HCL 4 MG/2ML IJ SOLN
INTRAMUSCULAR | Status: DC | PRN
  Administered 2022-05-16: 4 mg via INTRAVENOUS

## 2022-05-16 MED ORDER — FAMOTIDINE 20 MG PO TABS: 10.0000 mg | ORAL_TABLET | Freq: Every day | ORAL | Status: DC | PRN

## 2022-05-16 MED ORDER — SODIUM CHLORIDE 0.9 % IV SOLN: 250.0000 mL | INTRAVENOUS | Status: DC | PRN

## 2022-05-16 MED ORDER — DEXAMETHASONE SODIUM PHOSPHATE 10 MG/ML IJ SOLN
INTRAMUSCULAR | Status: DC | PRN
  Administered 2022-05-16: 4 mg via INTRAVENOUS

## 2022-05-16 MED ORDER — LIDOCAINE 2% (20 MG/ML) 5 ML SYRINGE
INTRAMUSCULAR | Status: DC | PRN
  Administered 2022-05-16: 100 mg via INTRAVENOUS

## 2022-05-16 MED ORDER — EZETIMIBE 10 MG PO TABS
10.0000 mg | ORAL_TABLET | Freq: Every day | ORAL | Status: DC
  Administered 2022-05-16: 10 mg via ORAL
  Filled 2022-05-16 (×2): qty 1

## 2022-05-16 MED ORDER — NITROGLYCERIN 0.4 MG SL SUBL: 0.4000 mg | SUBLINGUAL_TABLET | SUBLINGUAL | Status: DC | PRN

## 2022-05-16 MED ORDER — ROSUVASTATIN CALCIUM 20 MG PO TABS
40.0000 mg | ORAL_TABLET | Freq: Every day | ORAL | Status: DC
  Administered 2022-05-16: 40 mg via ORAL
  Filled 2022-05-16 (×2): qty 2

## 2022-05-16 MED ORDER — BISOPROLOL FUMARATE 5 MG PO TABS
10.0000 mg | ORAL_TABLET | Freq: Every day | ORAL | Status: DC
  Administered 2022-05-16: 10 mg via ORAL
  Filled 2022-05-16: qty 1
  Filled 2022-05-16 (×2): qty 2

## 2022-05-16 MED ORDER — MIDAZOLAM HCL 2 MG/2ML IJ SOLN
INTRAMUSCULAR | Status: DC | PRN
  Administered 2022-05-16: 2 mg via INTRAVENOUS

## 2022-05-16 MED ORDER — SUGAMMADEX SODIUM 200 MG/2ML IV SOLN
INTRAVENOUS | Status: DC | PRN
  Administered 2022-05-16: 200 mg via INTRAVENOUS

## 2022-05-16 MED ORDER — SODIUM CHLORIDE 0.9 % IV SOLN: INTRAVENOUS | Status: DC

## 2022-05-16 MED ORDER — DOBUTAMINE INFUSION FOR EP/ECHO/NUC (1000 MCG/ML)
INTRAVENOUS | Status: DC | PRN
  Administered 2022-05-16: 20 ug/kg/min via INTRAVENOUS

## 2022-05-16 MED ORDER — FAMOTIDINE-CA CARB-MAG HYDROX 10-800-165 MG PO CHEW: 1.0000 | CHEWABLE_TABLET | Freq: Every day | ORAL | Status: DC | PRN

## 2022-05-16 MED ORDER — FENTANYL CITRATE (PF) 100 MCG/2ML IJ SOLN
25.0000 ug | Freq: Once | INTRAMUSCULAR | Status: AC
  Administered 2022-05-16: 25 ug via INTRAVENOUS

## 2022-05-16 MED ORDER — FENTANYL CITRATE (PF) 100 MCG/2ML IJ SOLN
INTRAMUSCULAR | Status: AC
  Filled 2022-05-16: qty 2

## 2022-05-16 MED ORDER — HEPARIN SODIUM (PORCINE) 1000 UNIT/ML IJ SOLN
INTRAMUSCULAR | Status: DC | PRN
  Administered 2022-05-16: 14000 [IU] via INTRAVENOUS

## 2022-05-16 MED ORDER — PROPOFOL 10 MG/ML IV BOLUS
INTRAVENOUS | Status: DC | PRN
  Administered 2022-05-16: 30 mg via INTRAVENOUS
  Administered 2022-05-16: 170 mg via INTRAVENOUS

## 2022-05-16 MED ORDER — CLOPIDOGREL BISULFATE 75 MG PO TABS
ORAL_TABLET | ORAL | Status: AC
  Filled 2022-05-16: qty 1

## 2022-05-16 MED ORDER — PHENYLEPHRINE HCL-NACL 20-0.9 MG/250ML-% IV SOLN
INTRAVENOUS | Status: DC | PRN
  Administered 2022-05-16: 20 ug/min via INTRAVENOUS

## 2022-05-16 MED ORDER — HYDRALAZINE HCL 25 MG PO TABS
25.0000 mg | ORAL_TABLET | Freq: Two times a day (BID) | ORAL | Status: DC
  Administered 2022-05-16 – 2022-05-17 (×3): 25 mg via ORAL
  Filled 2022-05-16 (×3): qty 1

## 2022-05-16 MED ORDER — PROTAMINE SULFATE 10 MG/ML IV SOLN
INTRAVENOUS | Status: DC | PRN
  Administered 2022-05-16: 40 mg via INTRAVENOUS

## 2022-05-16 MED ORDER — SODIUM CHLORIDE 0.9% FLUSH: 3.0000 mL | Freq: Two times a day (BID) | INTRAVENOUS | Status: DC

## 2022-05-16 MED ORDER — HEPARIN SODIUM (PORCINE) 1000 UNIT/ML IJ SOLN
INTRAMUSCULAR | Status: AC
  Filled 2022-05-16: qty 20

## 2022-05-16 MED ORDER — HEPARIN SODIUM (PORCINE) 1000 UNIT/ML IJ SOLN
INTRAMUSCULAR | Status: DC | PRN
  Administered 2022-05-16: 1000 [IU] via INTRAVENOUS

## 2022-05-16 MED ORDER — SACUBITRIL-VALSARTAN 97-103 MG PO TABS
1.0000 | ORAL_TABLET | Freq: Two times a day (BID) | ORAL | Status: DC
  Administered 2022-05-16 – 2022-05-17 (×3): 1 via ORAL
  Filled 2022-05-16 (×5): qty 1

## 2022-05-16 MED ORDER — CALCIUM CARBONATE ANTACID 500 MG PO CHEW: 1.0000 | CHEWABLE_TABLET | Freq: Every day | ORAL | Status: DC | PRN

## 2022-05-16 MED ORDER — DOBUTAMINE INFUSION FOR EP/ECHO/NUC (1000 MCG/ML)
INTRAVENOUS | Status: AC
  Filled 2022-05-16: qty 250

## 2022-05-16 MED ORDER — ACETAMINOPHEN 500 MG PO TABS
500.0000 mg | ORAL_TABLET | Freq: Four times a day (QID) | ORAL | Status: DC | PRN
  Administered 2022-05-16: 500 mg via ORAL

## 2022-05-16 MED ORDER — APIXABAN 5 MG PO TABS
5.0000 mg | ORAL_TABLET | Freq: Two times a day (BID) | ORAL | Status: DC
  Administered 2022-05-16 – 2022-05-17 (×3): 5 mg via ORAL
  Filled 2022-05-16 (×3): qty 1

## 2022-05-16 MED ORDER — HEPARIN (PORCINE) IN NACL 1000-0.9 UT/500ML-% IV SOLN
INTRAVENOUS | Status: DC | PRN
  Administered 2022-05-16 (×4): 500 mL

## 2022-05-16 MED ORDER — SODIUM CHLORIDE 0.9% FLUSH: 3.0000 mL | INTRAVENOUS | Status: DC | PRN

## 2022-05-16 MED ORDER — FENTANYL CITRATE (PF) 250 MCG/5ML IJ SOLN
INTRAMUSCULAR | Status: DC | PRN
  Administered 2022-05-16: 100 ug via INTRAVENOUS

## 2022-05-16 MED ORDER — DAPAGLIFLOZIN PROPANEDIOL 10 MG PO TABS
10.0000 mg | ORAL_TABLET | Freq: Every day | ORAL | Status: DC
  Filled 2022-05-16 (×2): qty 1

## 2022-05-16 MED ORDER — SPIRONOLACTONE 25 MG PO TABS
25.0000 mg | ORAL_TABLET | Freq: Every day | ORAL | Status: DC
  Administered 2022-05-16 – 2022-05-17 (×2): 25 mg via ORAL
  Filled 2022-05-16 (×2): qty 1

## 2022-05-16 MED ORDER — ROCURONIUM BROMIDE 10 MG/ML (PF) SYRINGE
PREFILLED_SYRINGE | INTRAVENOUS | Status: DC | PRN
  Administered 2022-05-16: 80 mg via INTRAVENOUS

## 2022-05-16 MED ORDER — CLOPIDOGREL BISULFATE 75 MG PO TABS
75.0000 mg | ORAL_TABLET | Freq: Every day | ORAL | Status: DC
  Administered 2022-05-16 – 2022-05-17 (×2): 75 mg via ORAL
  Filled 2022-05-16 (×2): qty 1

## 2022-05-16 MED ORDER — OXYCODONE-ACETAMINOPHEN 5-325 MG PO TABS
1.0000 | ORAL_TABLET | ORAL | Status: DC | PRN
  Administered 2022-05-16 – 2022-05-17 (×2): 1 via ORAL
  Filled 2022-05-16 (×3): qty 1

## 2022-05-16 MED ORDER — LORATADINE 10 MG PO TABS
10.0000 mg | ORAL_TABLET | Freq: Every day | ORAL | Status: DC
  Administered 2022-05-16: 10 mg via ORAL
  Filled 2022-05-16 (×2): qty 1

## 2022-05-16 MED ORDER — PHENYLEPHRINE 80 MCG/ML (10ML) SYRINGE FOR IV PUSH (FOR BLOOD PRESSURE SUPPORT)
PREFILLED_SYRINGE | INTRAVENOUS | Status: DC | PRN
  Administered 2022-05-16: 80 ug via INTRAVENOUS
  Administered 2022-05-16: 160 ug via INTRAVENOUS
  Administered 2022-05-16: 80 ug via INTRAVENOUS

## 2022-05-16 SURGICAL SUPPLY — 19 items
CATH 8FR REPROCESSED SOUNDSTAR (CATHETERS) ×1 IMPLANT
CATH 8FR SOUNDSTAR REPROCESSED (CATHETERS) IMPLANT
CATH ABLAT QDOT MICRO BI TC DF (CATHETERS) IMPLANT
CATH OCTARAY 2.0 F 3-3-3-3-3 (CATHETERS) IMPLANT
CATH PIGTAIL STEERABLE D1 8.7 (WIRE) IMPLANT
CATH S-M CIRCA TEMP PROBE (CATHETERS) IMPLANT
CATH WEBSTER BI DIR CS D-F CRV (CATHETERS) IMPLANT
CLOSURE PERCLOSE PROSTYLE (VASCULAR PRODUCTS) IMPLANT
COVER SWIFTLINK CONNECTOR (BAG) ×1 IMPLANT
PACK EP LATEX FREE (CUSTOM PROCEDURE TRAY) ×1
PACK EP LF (CUSTOM PROCEDURE TRAY) ×1 IMPLANT
PAD DEFIB RADIO PHYSIO CONN (PAD) ×1 IMPLANT
PATCH CARTO3 (PAD) IMPLANT
SHEATH CARTO VIZIGO SM CVD (SHEATH) IMPLANT
SHEATH PINNACLE 7F 10CM (SHEATH) IMPLANT
SHEATH PINNACLE 8F 10CM (SHEATH) IMPLANT
SHEATH PINNACLE 9F 10CM (SHEATH) IMPLANT
SHEATH PROBE COVER 6X72 (BAG) IMPLANT
TUBING SMART ABLATE COOLFLOW (TUBING) IMPLANT

## 2022-05-16 NOTE — Transfer of Care (Signed)
Immediate Anesthesia Transfer of Care Note  Patient: Christopher Gutierrez  Procedure(s) Performed: ATRIAL FIBRILLATION ABLATION  Patient Location: Cath Lab  Anesthesia Type:General  Level of Consciousness: awake, alert , and oriented  Airway & Oxygen Therapy: Patient Spontanous Breathing and Patient connected to nasal cannula oxygen  Post-op Assessment: Report given to RN and Post -op Vital signs reviewed and stable  Post vital signs: Reviewed and stable  Last Vitals:  Vitals Value Taken Time  BP 143/78 05/16/22 1211  Temp    Pulse 66 05/16/22 1212  Resp 9 05/16/22 1212  SpO2 100 % 05/16/22 1212  Vitals shown include unvalidated device data.  Last Pain:  Vitals:   05/16/22 1205  TempSrc: Temporal  PainSc:          Complications: There were no known notable events for this encounter.

## 2022-05-16 NOTE — H&P (Signed)
Electrophysiology Office Note   Date:  05/16/2022   ID:  Christopher Gutierrez, Christopher Gutierrez 11/04/1960, MRN [id number]  PCP:  [name], DO  Cardiologist:  [facility] Primary Electrophysiologist:  Lovie Zarling Meredith Leeds, MD    Chief Complaint: AF   History of Present Illness: Christopher Gutierrez is a 61 y.o. male who is being seen today for the evaluation of AF at the request of No ref. provider found. Presenting today for electrophysiology evaluation.  He has a history significant for hypertension, tobacco abuse, alcohol abuse.  He presented to cardiology clinic with atrial fibrillation.  He was found to be volume overloaded and discussed admission for hospitalization with diuresis and TEE cardioversion but declined.  He had an echo that showed an ejection fraction of 20 to 25% with reduced RV function.  He had a TEE cardioversion 11/01/2020.  He followed up in heart failure clinic after his hospitalization.  He was started on Entresto, Aldactone, bisoprolol.  Echo 01/26/2021 showed an improvement in his ejection fraction 45 to 50%.  Coronary CT showed RCA obstructive disease.  He had a left heart catheterization and had stenting of his RCA.  Today, denies symptoms of palpitations, chest pain, shortness of breath, orthopnea, PND, lower extremity edema, claudication, dizziness, presyncope, syncope, bleeding, or neurologic sequela. The patient is tolerating medications without difficulties. Plan ablation today.    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   Hypertension    Past Surgical History:  Procedure Laterality Date   ABDOMINAL AORTOGRAM W/LOWER EXTREMITY N/A 03/07/2021   Procedure: ABDOMINAL AORTOGRAM W/LOWER EXTREMITY;  Surgeon: Wellington Hampshire, MD;  Location: [facility];  Service: Cardiovascular;  Laterality: N/A;   CARDIOVERSION N/A  11/01/2020   Procedure: CARDIOVERSION;  Surgeon: Jerline Pain, MD;  Location: [facility];  Service: Cardiovascular;  Laterality: N/A;   CATARACT EXTRACTION Right    CORONARY STENT INTERVENTION N/A 12/24/2021   Procedure: CORONARY STENT INTERVENTION;  Surgeon: Nelva Bush, MD;  Location: [facility];  Service: Cardiovascular;  Laterality: N/A;   LEFT HEART CATH AND CORONARY ANGIOGRAPHY N/A 12/24/2021   Procedure: LEFT HEART CATH AND CORONARY ANGIOGRAPHY;  Surgeon: Nelva Bush, MD;  Location: [facility];  Service: Cardiovascular;  Laterality: N/A;   MOUTH SURGERY  2013   PERIPHERAL VASCULAR INTERVENTION Left 03/07/2021   Procedure: PERIPHERAL VASCULAR INTERVENTION;  Surgeon: Wellington Hampshire, MD;  Location: [facility];  Service: Cardiovascular;  Laterality: Left;  left SFA   TEE WITHOUT CARDIOVERSION N/A 11/01/2020   Procedure: TRANSESOPHAGEAL ECHOCARDIOGRAM (TEE);  Surgeon: Jerline Pain, MD;  Location: [facility];  Service: Cardiovascular;  Laterality: N/A;     Current Facility-Administered Medications  Medication Dose Route Frequency Provider Last Rate Last Admin   0.9 %  sodium chloride infusion   Intravenous Continuous Constance Haw, MD 50 mL/hr at 05/16/22 0931 New Bag at 05/16/22 0931    Allergies:   Beta adrenergic blockers and Tape   Social History:  The patient  reports that he has been smoking cigarettes. He has a 37.50 pack-year smoking history. His smokeless tobacco use includes chew. He reports current alcohol use of about 24.0 standard drinks of alcohol per week. He reports current drug use. Frequency: 2.00 times per week. Drug: Marijuana.   Family History:  The patient's family history includes  Birth defects in his paternal aunt; COPD in his maternal uncle; Gout in his brother; Hypertension in his brother; Stroke in his paternal aunt.   ROS:  Please see the history of present illness.   Otherwise, review of systems is positive for  none.   All other systems are reviewed and negative.   PHYSICAL EXAM: VS:  BP (!) 159/95   Pulse 62   Temp 97.7 F (36.5 C) (Temporal)   Resp 16   Ht '6\' 2"'$  (1.88 m)   Wt 95.3 kg   SpO2 98%   BMI 26.96 kg/m  , BMI Body mass index is 26.96 kg/m. GEN: Well nourished, well developed, in no acute distress  HEENT: normal  Neck: no JVD, carotid bruits, or masses Cardiac: RRR; no murmurs, rubs, or gallops,no edema  Respiratory:  clear to auscultation bilaterally, normal work of breathing GI: soft, nontender, nondistended, + BS MS: no deformity or atrophy  Skin: warm and dry Neuro:  Strength and sensation are intact Psych: euthymic mood, full affect  Recent Labs: 09/01/2021: ALT 32 10/11/2021: TSH 2.670 01/16/2022: Magnesium 2.2 05/03/2022: BUN 15; Creatinine, Ser 1.19; Hemoglobin 17.7; Platelets 176; Potassium 4.6; Sodium 143    Lipid Panel     Component Value Date/Time   CHOL 143 04/25/2022 1543   TRIG 102 04/25/2022 1543   HDL 65 04/25/2022 1543   CHOLHDL 2.2 04/25/2022 1543   CHOLHDL 3.3 01/26/2021 1213   VLDL 21 01/26/2021 1213   LDLCALC 59 04/25/2022 1543     Wt Readings from Last 3 Encounters:  05/16/22 95.3 kg  04/25/22 96.3 kg  03/20/22 96.1 kg      Other studies Reviewed: Additional studies/ records that were reviewed today include: [facility] 12/24/21  Review of the above records today demonstrates:  Severe single-vessel coronary artery disease with 80-90% mid RCA stenosis.  There is mild-moderate, nonobstructive disease involving the left coronary artery. Low normal left ventricular systolic function (LVEF 67-20%) with upper normal filling pressure (LVEDP 15 mmHg). Successful PCI to mid RCA using Synergy 2.75 x 20 mm drug-eluting stent (postdilated to 3.3 mm) with 0% residual stenosis and TIMI-3 flow.  TTE 01/26/21  1. Compared with echo 04/4708, systolic function has improved.   2. Left ventricular ejection fraction, by estimation, is 45 to 50%. The  left ventricle  has mildly decreased function. The left ventricle  demonstrates global hypokinesis. There is moderate concentric left  ventricular hypertrophy. Left ventricular  diastolic parameters are consistent with Grade I diastolic dysfunction  (impaired relaxation).   3. Right ventricular systolic function is normal. The right ventricular  size is normal.   4. The mitral valve is normal in structure. Trivial mitral valve  regurgitation. No evidence of mitral stenosis.   5. The aortic valve is tricuspid. Aortic valve regurgitation is not  visualized. No aortic stenosis is present.   6. The inferior vena cava is normal in size with greater than 50%  respiratory variability, suggesting right atrial pressure of 3 mmHg.   ASSESSMENT AND PLAN:  1.  Persistent atrial fibrillation / flutter: Christopher Gutierrez has presented today for surgery, with the diagnosis of AF.  The various methods of treatment have been discussed with the patient and family. After consideration of risks, benefits and other options for treatment, the patient has consented to  Procedure(s): Catheter ablation as a surgical intervention .  Risks include but not limited to complete heart block, stroke, esophageal damage, nerve damage, bleeding, vascular damage, tamponade, perforation, MI, and death.  The patient's history has been reviewed, patient examined, no change in status, stable for surgery.  I have reviewed the patient's chart and labs.  Questions were answered to the patient's satisfaction.    Shenell Rogalski Curt Bears, MD 05/16/2022 9:32 AM

## 2022-05-16 NOTE — Discharge Instructions (Signed)
Post procedure care instructions No driving for 4 days. No lifting over 5 lbs for 1 week. No vigorous or sexual activity for 1 week. You may return to work/your usual activities on 05/24/22. Keep procedure site clean & dry. If you notice increased pain, swelling, bleeding or pus, call/return!  You may shower after 24 hours, but no soaking in baths/hot tubs/pools for 1 week.   You have an appointment set up with the [facility].  Multiple studies have shown that being followed by a dedicated atrial fibrillation clinic in addition to the standard care you receive from your other physicians improves health. We believe that enrollment in the atrial fibrillation clinic will allow [place] to better care for you.   The phone number to the [facility] is [phone]. The clinic is staffed Monday through Friday from 8:30am to 5pm.  Directions: The clinic is located in the [facility], [place] the hospital at the MAIN ENTRANCE "A", use [address] to the 6th floor.  Registration desk to the right of elevators on 6th floor  If you have any trouble locating the clinic, please don't hesitate to call [phone].

## 2022-05-16 NOTE — Anesthesia Postprocedure Evaluation (Signed)
Anesthesia Post Note  Patient: Christopher Gutierrez  Procedure(s) Performed: ATRIAL FIBRILLATION ABLATION     Patient location during evaluation: PACU Anesthesia Type: General Level of consciousness: sedated Pain management: pain level controlled Vital Signs Assessment: post-procedure vital signs reviewed and stable Respiratory status: spontaneous breathing and respiratory function stable Cardiovascular status: stable Postop Assessment: no apparent nausea or vomiting Anesthetic complications: no   There were no known notable events for this encounter.  Last Vitals:  Vitals:   05/16/22 1300 05/16/22 1315  BP: (!) 150/75 (!) 144/79  Pulse: 60 63  Resp: 10 10  Temp:    SpO2: 97% 97%    Last Pain:  Vitals:   05/16/22 1325  TempSrc:   PainSc: 7                  Jayan Raymundo DANIEL

## 2022-05-16 NOTE — Anesthesia Procedure Notes (Addendum)
Procedure Name: Intubation Date/Time: 05/16/2022 10:30 AM  Performed by: Anastasio Auerbach, CRNAPre-anesthesia Checklist: Patient identified, Emergency Drugs available, Suction available and Patient being monitored Patient Re-evaluated:Patient Re-evaluated prior to induction Oxygen Delivery Method: Circle system utilized Preoxygenation: Pre-oxygenation with 100% oxygen Induction Type: IV induction Ventilation: Mask ventilation without difficulty Laryngoscope Size: Mac and 4 Grade View: Grade III Tube type: Oral Tube size: 8.0 mm Number of attempts: 1 Airway Equipment and Method: Stylet and Oral airway Placement Confirmation: ETT inserted through vocal cords under direct vision, positive ETCO2 and breath sounds checked- equal and bilateral Secured at: 24 cm Tube secured with: Tape Dental Injury: Teeth and Oropharynx as per pre-operative assessment

## 2022-05-17 ENCOUNTER — Encounter (HOSPITAL_COMMUNITY): Payer: Self-pay | Admitting: Cardiology

## 2022-05-17 DIAGNOSIS — I4819 Other persistent atrial fibrillation: Secondary | ICD-10-CM

## 2022-05-17 NOTE — Progress Notes (Addendum)
Patient does not have a ride home. He feels well. In d/w Dr. Curt Bears, ok to drive home. Unfortunately he was given Percocet this AM at 08:41  The patient is certain that he did not in fact take the pain med, and gave t back t the RN, but RN reported she observed him take the medicine The patient is upset, wants to go home.  I have asked him to wait to see if the pharmacist can help clarify,  the pill he gave back to the RN. He is willing to wait, understands the issue He has been provided bus pass to home if needed.  Will await resolution after charge RN and pharmacy    Tommye Standard, PA-C   Confirmed tablet that the patient returned to the RN was in-fact the Percocet, he did not take it. OK it drive home  Tommye Standard, [place]

## 2022-05-17 NOTE — Progress Notes (Signed)
RN went over discharge instructions with patient. Per written instructions, no driving in 4 days. Patient stated provider stated he could drive home. Secure chat sent to provider. Provider also informed that patient received pain medication at 0841. Charge nurse also made aware.   Bus fare voucher provided. Patient became visibly upset with primary RN, charge nurse, and SWOT nurse at bedside. Primary RN informed patient that it unsafe for him to drive home, especially since he received percocet. Patient reported he did not take the percocet this morning, but put it in his bag. Patient pulled out a round white pill with "512" inscribed.   Provider informed. Security called.

## 2022-05-17 NOTE — Discharge Summary (Addendum)
ELECTROPHYSIOLOGY PROCEDURE DISCHARGE SUMMARY    Patient ID: Christopher Gutierrez,  MRN: [id number], DOB/AGE: [birth year] 61 y.o.  Admit date: 05/16/2022 Discharge date: 05/17/2022  Primary Care Physician: [name], DO Primary Cardiologist: Dr. Gardiner Rhyme Electrophysiologist: Dr. Curt Bears  Primary Discharge Diagnosis:  persistent Atrial fibrillation Atrial flutter      CHA2DS2Vasc is 3, on Eliquis  Secondary Discharge Diagnosis:  Chronic CHF (combined) compensated HTN CAD PAD   Procedures This Admission:  1.  Electrophysiology study and radiofrequency catheter ablation on by Dr Curt Bears   This study demonstrated   CONCLUSIONS: 1. Sinus rhythm upon presentation.   2. Successful electrical isolation and anatomical encircling of all four pulmonary veins with radiofrequency current.  A WACA approach was used 3. Additional left atrial ablation was performed with a standard box lesion created along the posterior wall of the left atrium 4. Cavotricuspid isthmus ablation 5. No early apparent complications.  Brief HPI: Christopher Gutierrez is a 61 y.o. male with a history of persistent atrial fibrillation, and AFlutter   Risks, benefits, and alternatives to catheter ablation  were reviewed with the patient who wished to proceed.   Hospital Course:  The patient was admitted and underwent EPS/RFCA of atrial fibrillation/and CTI ablation with details as outlined above.  He monitored on telemetry overnight which demonstrated SR.  Groin sites are without complication on the day of discharge.  The patient feels well today, denies CP, SOB, site discomfort.  He was examined by Dr. Curt Bears and considered to be stable for discharge.  Wound care and restrictions were reviewed with the patient.  The patient Christopher Gutierrez be seen back by the Afib clinic in 4 weeks and Dr Curt Bears in 12 weeks for post ablation follow up.    Physical Exam: Vitals:   05/16/22 1945 05/17/22 0037 05/17/22 0533  05/17/22 0735  BP: 131/80 117/75 127/71 127/84  Pulse: 63 62 69 (!) 59  Resp: '16 16 20 18  '$ Temp: 98.1 F (36.7 C) 98.1 F (36.7 C) 98.1 F (36.7 C) 98 F (36.7 C)  TempSrc: Oral Oral Oral Oral  SpO2: 99% 96% 95% 96%  Weight:      Height:        GEN- The patient is well appearing, alert and oriented x 3 today.   HEENT: normocephalic, atraumatic; sclera clear, conjunctiva pink; hearing intact; oropharynx clear; neck supple  Lungs- CTA b/l, normal work of breathing.  No wheezes, rales, rhonchi Heart- RRR, no murmurs, rubs or gallops  GI- soft, non-tender, non-distended, bowel sounds present  Extremities- no clubbing, cyanosis, or edema; b/l groin sites are without hematoma/bruit MS- no significant deformity or atrophy Skin- warm and dry, no rash or lesion Psych- euthymic mood, full affect Neuro- strength and sensation are intact   Labs:   Lab Results  Component Value Date   WBC 11.8 (H) 05/03/2022   HGB 17.7 05/03/2022   HCT 51.9 (H) 05/03/2022   MCV 99 (H) 05/03/2022   PLT 176 05/03/2022   No results for input(s): "NA", "K", "CL", "CO2", "BUN", "CREATININE", "CALCIUM", "PROT", "BILITOT", "ALKPHOS", "ALT", "AST", "GLUCOSE" in the last 168 hours.  Invalid input(s): "LABALBU"   Discharge Medications:  Allergies as of 05/17/2022       Reactions   Beta Adrenergic Blockers Other (See Comments)   Scrotal edema with metoprolol and carvedilol   Tape Rash   Plastic tape        Medication List     TAKE these medications  acetaminophen 500 MG tablet Commonly known as: TYLENOL Take 500 mg by mouth every 6 (six) hours as needed for moderate pain.   bisoprolol 10 MG tablet Commonly known as: ZEBETA Take 1 tablet (10 mg total) by mouth daily.   cetirizine 10 MG tablet Commonly known as: ZYRTEC Take 10 mg by mouth as needed for allergies.   clopidogrel 75 MG tablet Commonly known as: PLAVIX TAKE 1 TABLET BY MOUTH EVERY DAY   dapagliflozin propanediol 10 MG  Tabs tablet Commonly known as: Farxiga Take 1 tablet (10 mg total) by mouth daily before breakfast. NEEDS FOLLOW UP APPOINTMENT FOR ANYMORE REFILLS   Eliquis 5 MG Tabs tablet Generic drug: apixaban TAKE 1 TABLET BY MOUTH TWICE A DAY   Entresto 97-103 MG Generic drug: sacubitril-valsartan TAKE 1 TABLET BY MOUTH TWICE A DAY   ezetimibe 10 MG tablet Commonly known as: ZETIA Take 1 tablet (10 mg total) by mouth daily.   famotidine-calcium carbonate-magnesium hydroxide 10-800-165 MG chewable tablet Commonly known as: PEPCID COMPLETE Chew 1 tablet by mouth daily as needed (acid reflux).   hydrALAZINE 25 MG tablet Commonly known as: APRESOLINE TAKE 1 TABLET (25 MG) BY MOUTH IN THE MORNING AND AT BEDTIME   nitroGLYCERIN 0.4 MG SL tablet Commonly known as: NITROSTAT Place 1 tablet (0.4 mg total) under the tongue every 5 (five) minutes as needed.   REFRESH OP Place 1 drop into both eyes daily as needed (dry eyes).   rosuvastatin 40 MG tablet Commonly known as: CRESTOR TAKE 1 TABLET BY MOUTH EVERY DAY   Simbrinza 1-0.2 % Susp Generic drug: Brinzolamide-Brimonidine Apply 1 drop to eye 2 (two) times daily.   spironolactone 25 MG tablet Commonly known as: ALDACTONE Take 1 tablet (25 mg total) by mouth daily.        Disposition: Home Discharge Instructions     Diet - low sodium heart healthy   Complete by: As directed    Increase activity slowly   Complete by: As directed         Duration of Discharge Encounter: Greater than 30 minutes including physician time.  SignedTommye Standard, PA-C 05/17/2022 10:36 AM   I have seen and examined this patient with Tommye Standard.  Agree with above, note added to reflect my findings.  Patient is status post atrial fibrillation ablation yesterday.  Admitted overnight as he did not have help at home.  Remains in sinus rhythm.  Plan for discharge today with follow-up in clinic.  GEN: Well nourished, well developed, in no acute  distress  HEENT: normal  Neck: no JVD, carotid bruits, or masses Cardiac: RRR; no murmurs, rubs, or gallops,no edema  Respiratory:  clear to auscultation bilaterally, normal work of breathing GI: soft, nontender, nondistended, + BS MS: no deformity or atrophy  Skin: warm and dry Neuro:  Strength and sensation are intact Psych: euthymic mood, full affect     Zyia Kaneko M. Mahad Newstrom MD 05/17/2022 10:54 AM

## 2022-05-17 NOTE — Progress Notes (Signed)
Charge nurse called pharmacy, stated someone is unable to come up at this time. Primary RN, charge nurse, and manager compared pill with percocet in stock in pyxis. Determined that patient did not take medication.   Patient escorted off unit by security.

## 2022-05-17 NOTE — Progress Notes (Signed)
   05/17/22 1045  Mobility  Activity Ambulated independently in hallway  Level of Assistance Independent  Assistive Device None  Distance Ambulated (ft) 250 ft  Activity Response Tolerated well  Mobility Referral Yes  $Mobility charge 1 Mobility   Mobility Specialist Progress Note  Received pt in bed having no complaints and agreeable to mobility. Had c/o groin soreness throughout ambulation. Left in room w/ call bell in reach and all needs met.   Lucious Groves Mobility Specialist

## 2022-05-17 NOTE — TOC Transition Note (Signed)
Transition of Care [facility]) - CM/SW Discharge Note   Patient Details  Name: Christopher Gutierrez MRN: [id number] Date of Birth: 1960/12/27  [facility]) CM/SW Contact:  Zenon Mayo, RN Phone Number: 05/17/2022, 11:50 AM   Clinical Narrative:    Patient is for dc today, he needs ast with transport, he was given a bus pass.  He has no other needs.          Patient Goals and CMS Choice        Discharge Placement                       Discharge Plan and Services                                     Social Determinants of Health (SDOH) Interventions     Readmission Risk Interventions     No data to display

## 2022-05-22 ENCOUNTER — Other Ambulatory Visit: Payer: Self-pay | Admitting: Cardiology

## 2022-05-22 MED FILL — Fentanyl Citrate Preservative Free (PF) Inj 100 MCG/2ML: INTRAMUSCULAR | Qty: 2 | Status: AC

## 2022-05-27 ENCOUNTER — Telehealth: Payer: Self-pay

## 2022-05-27 ENCOUNTER — Ambulatory Visit: Payer: 59

## 2022-05-27 NOTE — Telephone Encounter (Signed)
Called patient for health coaching session over the phone. Patient stated that he was not feeling well due to procedure he had on 10-12, which he stated was more invasive than the thought. Patient stated he has bruises on his thighs, but is getting around a little better. Patient did mentioned that he has smoked some since his procedure. Patient has not been able to get to the grocery store since the procedure. Patient requested to be rescheduled. Patient has been rescheduled for 10/30 at 2:00pm. Patient will be called at this time.    Christopher Gutierrez Christopher Gutierrez Guide, Health Coach [address], [address] [place] [address] Telephone: [phone] Email: [email or web address]

## 2022-05-29 ENCOUNTER — Ambulatory Visit (INDEPENDENT_AMBULATORY_CARE_PROVIDER_SITE_OTHER): Payer: 59 | Admitting: Psychologist

## 2022-05-29 DIAGNOSIS — F411 Generalized anxiety disorder: Secondary | ICD-10-CM

## 2022-05-29 NOTE — Progress Notes (Signed)
[facility] Counselor/Therapist Progress Note  Patient ID: Christopher Gutierrez, MRN: [id number],    Date: 05/29/2022  Time Spent: 01:58 pm to 02:36 pm; total time: 38 minutes   This session was held via in person. The patient consented to in-person therapy and was in the clinician's office. Limits of confidentiality were discussed with the patient.   Treatment Type: Individual Therapy  Reported Symptoms: Depressed  Mental Status Exam: Appearance:  Well Groomed     Behavior: Appropriate  Motor: Normal  Speech/Language:  Slow  Affect: Flat  Mood: constricted  Thought process: normal  Thought content:   WNL  Sensory/Perceptual disturbances:   WNL  Orientation: oriented to person, place, and time/date  Attention: Good  Concentration: Good  Memory: WNL  Fund of knowledge:  Good  Insight:   Poor  Judgment:  Fair  Impulse Control: Good   Risk Assessment: Danger to Self:  No Self-injurious Behavior: No Danger to Others: No Duty to Warn:no Physical Aggression / Violence:No  Access to Firearms a concern: No  Gang Involvement:No   Subjective: Beginning the session, patient described  himself as okay and indicated that he is dealing with some stressful events. Specifically, he disclosed that work insurance may not cover his hospital stay following an ablation and that his company may be ending his disability. He voiced that coping skills have helped so that he is not as overwhelmed. From there, he briefly talked about the death of his close friend and did a life review of him. He voiced being open to doing an activity related to his friend. Then he talked about lack of purpose and motivation to do anything. He also described himself as having a difficult time opening up. He explored what has helped him in the process of therapy.He was agreeable to the homework and following up. He denied suicidal and homicidal ideation.    Interventions:  Worked on developing a  therapeutic relationship with the patient using active listening and reflective statements. Provided emotional support using empathy and validation. Reviewed the treatment plan with the patient. Reviewed events since the last session. Normalized and validated expressed thoughts and emotions. Spent time reflecting on ways to address financial concerns. Praised patient for being able to implement coping skills and discussed how they have helped. Processed the friendship that patient had. Assisted in doing a life review. Explored the idea of writing a letter or talking to him as though he is still present. Normalized patient's response. Began to explore the idea of purpose. Processed thoughts and emotions. Provided psychoeducation about values. Discussed next steps for counseling. Assisted in problem solving. Assigned homework. Provided empathic statements.Assessed for suicidal and homicidal ideation.   Homework: Write letter to deceased friend and honor him. Reflect on values. Reach out to cardiology social work about financial resources  Next Session: Review homework. Process finding purpose.   Diagnosis: F41.1 generalized anxiety disorder  Plan:  Goals Work through the grieving process and face reality of own death Accept emotional support from others around them Live life to the fullest, event though time may be limited Become as knowledgeable about the medical condition  Reduce fear, anxiety about the health condition  Accept the illness Accept the role of psychological and behavioral factors  Stabilize anxiety level wile increasing ability to function Learn and implement coping skills that result in a reduction of anxiety  Alleviate depressive symptoms Recognize, accept, and cope with depressive feelings Develop healthy thinking patterns Develop healthy interpersonal relationships  Objectives target  date for all objectives is 03/20/2023 Identify feelings associated with the illness Family  members share with each other feelings Identify the losses or limitations that have been experienced Verbalize acceptance of the reality of the medical condition Commit to learning and implement a proactive approach to managing personal stresses Verbalize an understanding of the medical condition Work with therapist to develop a plan for coping with stress Learn and implement skills for managing stress Engage in social, productive activities that are possible Engage in faith based activities implement positive imagery Identify coping skills and sources of emotional support Patient's partner and family members verbalize their fears regarding severity of health condition Identify sources of emotional distress  Learning and implement calming skills to reduce overall anxiety Learn and implement problem solving strategies Identify and engage in pleasant activities Learning and implement personal and interpersonal skills to reduce anxiety and improve interpersonal relationships Learn to accept limitations in life and commit to tolerating, rather than avoiding, unpleasant emotions while accomplishing meaningful goals Identify major life conflicts from the past and present that form the basis for present anxiety Learn and implement behavioral strategies Verbalize an understanding and resolution of current interpersonal problems Learn and implement problem solving and decision making skills Learn and implement conflict resolution skills to resolve interpersonal problems Verbalize an understanding of healthy and unhealthy emotions verbalize insight into how past relationships may be influence current experiences with depression Use mindfulness and acceptance strategies and increase value based behavior  Increase hopeful statements about the future.   Interventions Teach about stress and ways to handle stress Assist the patient in developing a coping action plan for stressors Conduct skills based  training for coping strategies Train problem focused skills Sort out what activities the individual can do Encourage patient to rely upon his/her spiritual faith Teach the patient to use guided imagery Probe and evaluate family's ability to provide emotional support Allow family to share their fears Assist the patient in identifying, sorting through, and verbalizing the various feelings generated by his/her medical condition Meet with family members  Ask patient list out limitations  Use stress inoculation training  Use Acceptance and Commitment Therapy to help client accept uncomfortable realities in order to accomplish value-consistent goals Reinforce the client's insight into the role of his/her past emotional pain and present anxiety  Discuss examples demonstrating that unrealistic worry overestimates the probability of threats and underestimate patient's ability  Assist the patient in analyzing his or her worries Help patient understand that avoidance is reinforcing  Behavioral activation help the client explore the relationship, nature of the dispute,  Help the client develop new interpersonal skills and relationships Conduct Problem so living therapy Teach conflict resolution skills Use a process-experiential approach Conduct TLDP Conduct ACT  The patient and clinician reviewed the treatment plan on 04/24/2022. The patient approved of the treatment plan.   Conception Chancy, PsyD

## 2022-05-30 ENCOUNTER — Ambulatory Visit (HOSPITAL_COMMUNITY): Payer: 59 | Admitting: Physician Assistant

## 2022-05-30 ENCOUNTER — Telehealth: Payer: Self-pay | Admitting: Cardiology

## 2022-05-30 DIAGNOSIS — I4719 Other supraventricular tachycardia: Secondary | ICD-10-CM

## 2022-05-30 DIAGNOSIS — I4891 Unspecified atrial fibrillation: Secondary | ICD-10-CM

## 2022-05-30 NOTE — Telephone Encounter (Signed)
Patient called triage to report "rapid heart beat." Patient had ablation on 10/12. Current BP 136/105, P 127. Patient denies chest pain, SOB, dizziness, lightheadedness. Dr. Gardiner Rhyme advised. He ordered for patient to be seen at afib clinic today or tomorrow and to continue taking medications. Spoke with R. Fenton at [facility] [facility] clinic. Patient can be scheduled for tomorrow. Returned call to patient to advise him of afib clinic and to continue with medications. Patient chose the 1030 time slot. R. Fenton advised. Pt. asked what to do in the meantime. Recommended that if he gets sob, chest pain, dizzy, or lightheaded, to be taken to the ED.

## 2022-05-30 NOTE — Telephone Encounter (Signed)
Per Dr. Curt Bears pt advised to take a 2nd dose of Bisoprolol today. Pt reports BP this morning 135/100. Pt takes his Bisoprolol at night. Advised pt to take a dose now, and take his normal dose tonight. Aware to go back to daily dosing tomorrow. Patient verbalized understanding and agreeable to plan.

## 2022-05-30 NOTE — Telephone Encounter (Signed)
STAT if HR is under 50 or over 120 (normal HR is 60-100 beats per minute)  What is your heart rate? 128  Do you have a log of your heart rate readings (document readings)? No  Do you have any other symptoms? No   Pt states that HR started increasing last night. Pt also had Ablation 2 weeks ago. Please advise

## 2022-05-31 ENCOUNTER — Encounter (HOSPITAL_COMMUNITY): Payer: Self-pay | Admitting: Emergency Medicine

## 2022-05-31 ENCOUNTER — Emergency Department (HOSPITAL_COMMUNITY)
Admission: EM | Admit: 2022-05-31 | Discharge: 2022-05-31 | Disposition: A | Discharge status: Home / Self Care | Payer: 59 | Attending: Emergency Medicine | Admitting: Emergency Medicine

## 2022-05-31 ENCOUNTER — Inpatient Hospital Stay: Payer: 59 | Admitting: Family Medicine

## 2022-05-31 ENCOUNTER — Other Ambulatory Visit (HOSPITAL_COMMUNITY): Payer: Self-pay

## 2022-05-31 ENCOUNTER — Ambulatory Visit (HOSPITAL_BASED_OUTPATIENT_CLINIC_OR_DEPARTMENT_OTHER)
Admission: RE | Admit: 2022-05-31 | Discharge: 2022-05-31 | Disposition: A | Discharge status: Home / Self Care | Payer: 59 | Source: Ambulatory Visit | Attending: Physician Assistant | Admitting: Physician Assistant

## 2022-05-31 ENCOUNTER — Emergency Department (HOSPITAL_COMMUNITY): Payer: 59

## 2022-05-31 ENCOUNTER — Encounter (HOSPITAL_COMMUNITY): Payer: Self-pay | Admitting: Physician Assistant

## 2022-05-31 ENCOUNTER — Other Ambulatory Visit: Payer: Self-pay

## 2022-05-31 VITALS — BP 88/70 | HR 134 | Ht 74.0 in | Wt 209.2 lb

## 2022-05-31 DIAGNOSIS — D72829 Elevated white blood cell count, unspecified: Secondary | ICD-10-CM | POA: Insufficient documentation

## 2022-05-31 DIAGNOSIS — I5021 Acute systolic (congestive) heart failure: Secondary | ICD-10-CM | POA: Diagnosis not present

## 2022-05-31 DIAGNOSIS — Z7901 Long term (current) use of anticoagulants: Secondary | ICD-10-CM | POA: Insufficient documentation

## 2022-05-31 DIAGNOSIS — I4589 Other specified conduction disorders: Secondary | ICD-10-CM | POA: Insufficient documentation

## 2022-05-31 DIAGNOSIS — D6869 Other thrombophilia: Secondary | ICD-10-CM | POA: Insufficient documentation

## 2022-05-31 DIAGNOSIS — G4733 Obstructive sleep apnea (adult) (pediatric): Secondary | ICD-10-CM | POA: Insufficient documentation

## 2022-05-31 DIAGNOSIS — I483 Typical atrial flutter: Secondary | ICD-10-CM | POA: Diagnosis not present

## 2022-05-31 DIAGNOSIS — I4819 Other persistent atrial fibrillation: Secondary | ICD-10-CM | POA: Insufficient documentation

## 2022-05-31 DIAGNOSIS — I739 Peripheral vascular disease, unspecified: Secondary | ICD-10-CM | POA: Insufficient documentation

## 2022-05-31 DIAGNOSIS — Z87891 Personal history of nicotine dependence: Secondary | ICD-10-CM | POA: Diagnosis not present

## 2022-05-31 DIAGNOSIS — I5022 Chronic systolic (congestive) heart failure: Secondary | ICD-10-CM | POA: Insufficient documentation

## 2022-05-31 DIAGNOSIS — Z720 Tobacco use: Secondary | ICD-10-CM | POA: Insufficient documentation

## 2022-05-31 DIAGNOSIS — Z79899 Other long term (current) drug therapy: Secondary | ICD-10-CM | POA: Diagnosis not present

## 2022-05-31 DIAGNOSIS — I4892 Unspecified atrial flutter: Secondary | ICD-10-CM | POA: Insufficient documentation

## 2022-05-31 DIAGNOSIS — I484 Atypical atrial flutter: Secondary | ICD-10-CM

## 2022-05-31 DIAGNOSIS — I110 Hypertensive heart disease with heart failure: Secondary | ICD-10-CM | POA: Insufficient documentation

## 2022-05-31 DIAGNOSIS — I2510 Atherosclerotic heart disease of native coronary artery without angina pectoris: Secondary | ICD-10-CM | POA: Insufficient documentation

## 2022-05-31 DIAGNOSIS — R0602 Shortness of breath: Secondary | ICD-10-CM | POA: Diagnosis present

## 2022-05-31 LAB — TROPONIN I (HIGH SENSITIVITY)
Troponin I (High Sensitivity): 38 ng/L — ABNORMAL HIGH (ref <18)
Troponin I (High Sensitivity): 40 ng/L — ABNORMAL HIGH (ref <18)

## 2022-05-31 LAB — BASIC METABOLIC PANEL
Anion gap: 14 (ref 5–15)
BUN: 23 mg/dL — ABNORMAL HIGH (ref 6–20)
CO2: 17 mmol/L — ABNORMAL LOW (ref 22–32)
Calcium: 9.2 mg/dL (ref 8.9–10.3)
Chloride: 107 mmol/L (ref 98–111)
Creatinine, Ser: 1.51 mg/dL — ABNORMAL HIGH (ref 0.61–1.24)
GFR, Estimated: 53 mL/min — ABNORMAL LOW (ref >60)
Glucose, Bld: 128 mg/dL — ABNORMAL HIGH (ref 70–99)
Potassium: 4.3 mmol/L (ref 3.5–5.1)
Sodium: 138 mmol/L (ref 135–145)

## 2022-05-31 LAB — PROTIME-INR
INR: 1.1 (ref 0.8–1.2)
Prothrombin Time: 14 seconds (ref 11.4–15.2)

## 2022-05-31 LAB — CBC
HCT: 56.1 % — ABNORMAL HIGH (ref 39.0–52.0)
Hemoglobin: 18.5 g/dL — ABNORMAL HIGH (ref 13.0–17.0)
MCH: 33.5 pg (ref 26.0–34.0)
MCHC: 33 g/dL (ref 30.0–36.0)
MCV: 101.6 fL — ABNORMAL HIGH (ref 80.0–100.0)
Platelets: 238 10*3/uL (ref 150–400)
RBC: 5.52 MIL/uL (ref 4.22–5.81)
RDW: 12 % (ref 11.5–15.5)
WBC: 15.5 10*3/uL — ABNORMAL HIGH (ref 4.0–10.5)
nRBC: 0 % (ref 0.0–0.2)

## 2022-05-31 LAB — MAGNESIUM: Magnesium: 2.3 mg/dL (ref 1.7–2.4)

## 2022-05-31 LAB — BRAIN NATRIURETIC PEPTIDE: B Natriuretic Peptide: 621.5 pg/mL — ABNORMAL HIGH (ref 0.0–100.0)

## 2022-05-31 MED ORDER — AMIODARONE LOAD VIA INFUSION
150.0000 mg | Freq: Once | INTRAVENOUS | Status: DC
  Filled 2022-05-31: qty 83.34

## 2022-05-31 MED ORDER — AMIODARONE HCL 200 MG PO TABS
ORAL_TABLET | ORAL | 0 refills | Status: DC
  Filled 2022-05-31: qty 60, 30d supply, fill #0
  Filled 2022-06-25 – 2022-06-26 (×2): qty 30, 30d supply, fill #1
  Filled 2022-07-22 – 2022-07-23 (×2): qty 30, 30d supply, fill #2

## 2022-05-31 MED ORDER — PROPOFOL 10 MG/ML IV BOLUS
0.2500 mg/kg | Freq: Once | INTRAVENOUS | Status: AC
  Administered 2022-05-31: 23.7 mg via INTRAVENOUS

## 2022-05-31 MED ORDER — AMIODARONE IV BOLUS ONLY 150 MG/100ML
150.0000 mg | Freq: Once | INTRAVENOUS | Status: AC
  Administered 2022-05-31: 150 mg via INTRAVENOUS
  Filled 2022-05-31: qty 100

## 2022-05-31 MED ORDER — FENTANYL CITRATE PF 50 MCG/ML IJ SOSY
25.0000 ug | PREFILLED_SYRINGE | Freq: Once | INTRAMUSCULAR | Status: AC
  Administered 2022-05-31: 25 ug via INTRAVENOUS
  Filled 2022-05-31: qty 1

## 2022-05-31 MED ORDER — PROPOFOL 10 MG/ML IV BOLUS
0.5000 mg/kg | Freq: Once | INTRAVENOUS | Status: AC
  Administered 2022-05-31: 47.4 mg via INTRAVENOUS
  Filled 2022-05-31: qty 20

## 2022-05-31 MED ORDER — SODIUM CHLORIDE 0.9 % IV BOLUS
1000.0000 mL | Freq: Once | INTRAVENOUS | Status: AC
  Administered 2022-05-31: 1000 mL via INTRAVENOUS

## 2022-05-31 NOTE — Consult Note (Cosign Needed Addendum)
ELECTROPHYSIOLOGY CONSULT NOTE    Patient ID: Christopher Gutierrez MRN: [id number], DOB/AGE: May 07, 1961 61 y.o.  Admit date: 05/31/2022 Date of Consult: 05/31/2022  Primary Physician: [name], DO Primary Cardiologist: [name], MD  Electrophysiologist: [facility], Dr. Curt Bears  Referring Provider: Dr. Vanita Panda  Patient Profile: Christopher Gutierrez is a 61 y.o. 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 Vanita Panda.  HPI:  Christopher Gutierrez is a 61 y.o. male with history as above. He is s/p afib ablation, with PVI and CTI on 05/16/22 with Dr. Curt Bears. Was in his usual state of health until 10/25pm ~8pm after dinner when he started to feel "off" with some palpitations. He checked his home BP machine and his HR was in the 120s. His usual HR is in the 60s-70s, checks daily on home monitor. He thought it was due to the large dinner.   Woke up 10/26 still not feeling well with elevated HR. He called our clinic and was instructed to take an extra bisoprolol and they scheduled an appointment to eval today in afib clinic. In afib clinic today, his HR was still elevated, BP low/soft, SOB at rest. Of note, he did miss one dose of eliquis ~1.5 weeks ago, but has taken diligently since.   Potassium4.3 (10/27 1134) Magnesium  2.3 (10/27 1134) Creatinine, ser  1.51* (10/27 1134) PLT  238 (10/27 1134) HGB  18.5* (10/27 1134) WBC 15.5* (10/27 1134) Troponin I (High Sensitivity)38* (10/27 1134).    He denies chest pain, dyspnea, PND, orthopnea, nausea, vomiting, dizziness, syncope, edema, weight gain, or early satiety.  He states his bilateral groin sites are improving. They were dark blue after ablation, but are now green-yellow in his thighs. He has a small knot on the R groin that is not tender, not enlarging.  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   Hypertension      Surgical History:  Past Surgical History:  Procedure Laterality Date   ABDOMINAL AORTOGRAM W/LOWER EXTREMITY N/A 03/07/2021   Procedure: ABDOMINAL AORTOGRAM W/LOWER EXTREMITY;  Surgeon: Wellington Hampshire, MD;  Location: [facility];  Service: Cardiovascular;  Laterality: N/A;   ATRIAL FIBRILLATION ABLATION N/A 05/16/2022   Procedure: ATRIAL FIBRILLATION ABLATION;  Surgeon: Constance Haw, MD;  Location: [facility];  Service: Cardiovascular;  Laterality: N/A;   CARDIOVERSION N/A 11/01/2020   Procedure: CARDIOVERSION;  Surgeon: Jerline Pain, MD;  Location: [facility];  Service: Cardiovascular;  Laterality: N/A;   CATARACT EXTRACTION Right    CORONARY STENT INTERVENTION N/A 12/24/2021   Procedure: CORONARY STENT INTERVENTION;  Surgeon: Nelva Bush, MD;  Location: [facility];  Service: Cardiovascular;  Laterality: N/A;   LEFT HEART CATH AND CORONARY ANGIOGRAPHY N/A 12/24/2021   Procedure: LEFT HEART CATH AND CORONARY ANGIOGRAPHY;  Surgeon: Nelva Bush, MD;  Location: [facility];  Service: Cardiovascular;  Laterality: N/A;   MOUTH SURGERY  2013   PERIPHERAL VASCULAR INTERVENTION Left 03/07/2021   Procedure: PERIPHERAL VASCULAR INTERVENTION;  Surgeon: Wellington Hampshire, MD;  Location: [facility];  Service: Cardiovascular;  Laterality: Left;  left SFA   TEE WITHOUT CARDIOVERSION N/A 11/01/2020  Procedure: TRANSESOPHAGEAL ECHOCARDIOGRAM (TEE);  Surgeon: Jerline Pain, MD;  Location: [facility];  Service: Cardiovascular;  Laterality: N/A;     (Not in a hospital admission)   Inpatient Medications:   amiodarone  150 mg Intravenous Once    Allergies:  Allergies  Allergen Reactions   Beta Adrenergic Blockers Other (See Comments)    Scrotal  edema with metoprolol and carvedilol   Tape Rash    Plastic tape    Social History   Socioeconomic History   Marital status: Divorced    Spouse name: Not on file   Number of children: 1   Years of education: Not on file   Highest education level: Master's degree (e.g., MA, MS, MEng, MEd, MSW, MBA)  Occupational History   Not on file  Tobacco Use   Smoking status: Some Days    Packs/day: 1.50    Years: 25.00    Total pack years: 37.50    Types: Cigarettes   Smokeless tobacco: Current    Types: Chew   Tobacco comments:    05/17/21 Ronalee Belts has cut down smoking to 1/2 to 1 ppd.  Not ready to quit.    11/22/2021 Smokes some days    05/31/22 5-6 Cigarettes daily  Vaping Use   Vaping Use: Never used  Substance and Sexual Activity   Alcohol use: Yes    Alcohol/week: 24.0 standard drinks of alcohol    Types: 24 Cans of beer per week    Comment: 6 cans every other day 05/31/22   Drug use: Not Currently    Frequency: 2.0 times per week    Types: Marijuana    Comment: he took Marijuana 2 weeks ago   Sexual activity: Yes    Partners: Female  Other Topics Concern   Not on file  Social History Narrative   Not on file   Social Determinants of Health   Financial Resource Strain: Low Risk  (11/01/2020)   Overall Financial Resource Strain (CARDIA)    Difficulty of Paying Living Expenses: Not hard at all  Food Insecurity: No Food Insecurity (11/01/2020)   Hunger Vital Sign    Worried About Running Out of Food in the Last Year: Never true    Ran Out of Food in the Last Year: Never true  Transportation Needs: No Transportation Needs (11/01/2020)   PRAPARE - Hydrologist (Medical): No    Lack of Transportation (Non-Medical): No  Physical Activity: Not on file  Stress: Not on file  Social Connections: Not on file  Intimate Partner Violence: Not on file     Family History  Problem Relation Age of Onset   Gout Brother    Hypertension Brother    COPD  Maternal Uncle    Stroke Paternal Aunt    Birth defects Paternal Aunt      Review of Systems: All other systems reviewed and are otherwise negative except as noted above.  Physical Exam: Vitals:   05/31/22 1200 05/31/22 1235 05/31/22 1300 05/31/22 1330  BP: _0 90/76  Pulse: (!) 130 (!) 134 (!) 130 (!) 130  Resp: _1 Temp:      TempSrc:      SpO2: 97% 97% 98% 99%  Weight:      Height:        GEN- The patient is well appearing, alert and oriented x 3 today, room smells of cigarette smoke  HEENT: normocephalic, atraumatic; sclera clear, conjunctiva pink; hearing  intact; oropharynx clear; neck supple, missing some teeth Lungs- Clear to ausculation bilaterally, normal work of breathing.  No wheezes, rales, rhonchi Heart- irregular rate,  and rhythm, tachycardia, no murmurs, rubs or gallops GI- soft, non-tender, non-distended, bowel sounds present Extremities- no clubbing, cyanosis, or edema; DP/PT/radial pulses 2+ bilaterally MS- no significant deformity or atrophy Skin- warm and dry, no rash or lesion; bilateral groins/thighs evolving ecchymosis. R groin - 1cm nodule at prior ablation insertion site, not painful to touch Psych- euthymic mood, full affect Neuro- strength and sensation are intact  Labs:   Lab Results  Component Value Date   WBC 15.5 (H) 05/31/2022   HGB 18.5 (H) 05/31/2022   HCT 56.1 (H) 05/31/2022   MCV 101.6 (H) 05/31/2022   PLT 238 05/31/2022    Recent Labs  Lab 05/31/22 1134  NA 138  K 4.3  CL 107  CO2 17*  BUN 23*  CREATININE 1.51*  CALCIUM 9.2  GLUCOSE 128*      Radiology/Studies: DG Chest 1 View  Result Date: 05/31/2022 CLINICAL DATA:  Dizziness. EXAM: CHEST  1 VIEW COMPARISON:  Chest x-ray dated September 01, 2021. FINDINGS: The heart size and mediastinal contours are within normal limits. Both lungs are clear. No acute osseous abnormality. Old right-sided rib fractures again noted. IMPRESSION: No active disease.  Electronically Signed   By: Titus Dubin M.D.   On: 05/31/2022 11:44   EP STUDY  Result Date: 05/16/2022 SURGEON:  Allegra Lai, MD PREPROCEDURE DIAGNOSES: 1. Persistent atrial fibrillation. POSTPROCEDURE DIAGNOSES: 1. Persistent atrial fibrillation. PROCEDURES: 1. Comprehensive electrophysiologic study. 2. Coronary sinus pacing and recording. 3. Three-dimensional mapping of atrial fibrillation (with additional mapping and ablation within the left atrium due to persistence of afib) 4. Ablation of atrial fibrillation (with additional mapping and ablation within the left atrium due to persistence of afib) 5. Intracardiac echocardiography. 6. Transseptal puncture of an intact septum. 7. Arrhythmia induction with pacing INTRODUCTION:  Souleymane Saiki is a 61 y.o. male with a history of persistent atrial fibrillation who now presents for EP study and radiofrequency ablation.  The patient reports initially being diagnosed with atrial fibrillation after presenting with symptomatic palpitations and fatgiue.  The patient has failed medical therapy.  The patient therefore presents today for catheter ablation of atrial fibrillation. DESCRIPTION OF PROCEDURE:  Informed written consent was obtained, and the patient was brought to the electrophysiology lab in a fasting state.  The patient was adequately sedated with intravenous medications as outlined in the anesthesia report.  The patient's left and right groins were prepped and draped in the usual sterile fashion by the EP lab staff.  Using a percutaneous Seldinger technique, two 8-French hemostasis sheaths were placed in the right femoral vein, and one 7 [place] and one 11-French hemostasis sheaths were placed into the left common femoral vein. An esophageal temperature probe was inserted to monitor for heating of the esophagus during the procedure.  Each sheath site was preclosed using an Abbott Perclose and was closed at the end of the case. Direct ultrasound  guidance is used for right and left femoral veins with normal vessel patency. Ultrasound images are captured and stored in the patient's chart. Using ultrasound guidance, the Brockenbrough needle and wire were visualized entering the vessel. Catheter Placement:  A 7-French Biosense Webster Decapolar coronary sinus catheter was introduced through the right common femoral vein and advanced into the coronary sinus for recording and pacing from this location.  A luminal esophageal temperature probe was placed and  used for continuous monitoring of the luminal esophageal temperature throughout the procedure as well as to localize the esophagus on fluoroscopy. In addition, the esophagus was directly visualized with intracardiac echo and its positioned marked on Carto.  During ablation at the posterior wall there was limited esophageal heating noted during RF energy delivery with the maximal temperature recorded by the luminal temperature probe of < 38.5 degrees C. Initial Measurements: The patient presented to the electrophysiology lab in sinus rhythm. his  PR interval measured 181 msec with a QRS duration of 94 msec and a QT interval of 363 msec.   Intracardiac Echocardiography: An 8-French Biosense Webster AcuNav intracardiac echocardiography catheter was introduced through the right common femoral vein and advanced into the right atrium. Intracardiac echocardiography was performed of the left atrium, and a three-dimensional anatomical rendering of the left atrium was performed using CARTO sound technology.  The patient was noted to have a moderate sized left atrium.  The interatrial septum was prominent but not aneurysmal. All 4 pulmonary veins were visualized and noted to have separate ostia.  The pulmonary veins were moderate in size.  The left atrial appendage was visualized and did not reveal thrombus.   There was no evidence of pulmonary vein stenosis. Transseptal Puncture: The right common femoral vein sheaths  were exchanged for one 8.5 [employer] and one Bayless sheath and transseptal access was achieved with the Bayless in a standard fashion using a Bayless needle under fluoroscopy with intracardiac echocardiography confirmation of the transseptal puncture.  Once transseptal access had been achieved, heparin was administered intravenously and intra- arterially in order to maintain an ACT of greater than 350 seconds throughout the procedure.  3D Mapping and Ablation: A 3.5 mm Biosense [employer] Thermocool ablation catheter was advanced into the right atrium.  The transseptal sheath was pulled back into the IVC over a guidewire.  The ablation catheter was advanced across the transseptal hole using the wire as a guide.  The transseptal sheath was then re-advanced over the guidewire into the left atrium.  A Biosense Webster octarray mapping catheter was introduced through the transseptal sheath and positioned over the mouth of all 4 pulmonary veins.  Three-dimensional electroanatomical mapping was performed using CARTO technology.  This demonstrated electrical activity within all four pulmonary veins at baseline. The patient underwent successful sequential electrical isolation and anatomical encircling of all four pulmonary veins using radiofrequency current with a circular mapping catheter as a guide. A WACA approach was used. Due to persistence of atrial fibrillation, additional left atrial mapping and ablation was performed.  A series of radiofrequency lesions were delivered along the roof and floor of the left atrium in order to create a "standard box" lesion along the posterior wall of the left atrium. The ablation catheter was then pulled back into the right atrial and positioned along the cavo-tricuspid isthmus.  3D Mapping along the atrial side of the isthmus was performed.  This demonstrated a standard isthmus.  A series of radiofrequency applications were then delivered along the  isthmus at 30-40 Watts with a target index of 450-500.   Complete bidirectional cavotricuspid isthmus block was achieved as confirmed by differential atrial pacing from the low lateral right atrium.  A stimulus to earliest atrial activation across the isthmus measured 140 msec bi-directionally.  The patient was observe without return of conduction through the isthmus. 20 mcg/kg/min dobutamine was infused without arrhythmia induced. Measurements Following Ablation: In sinus rhythm the RR interval was 842mec, with  PR 174 msec, QRS 100 msec, and QT 416 msec.  Following ablation the AH interval measured 92 msec with an HV interval of 52 msec. Ventricular pacing was performed, which revealed midline decremental VA conduction with a VA Wenckebach cycle length of 310 msec. Rapid atrial pacing was performed, which revealed an AV Wenckebach cycle length of 320 msec.  Electroisolation was then again confirmed in all four pulmonary veins. The procedure was therefore considered completed.  All catheters were removed, and the sheaths were aspirated and flushed.  The patient was transferred to the recovery area for sheath removal per protocol.  Intracardiac echocardiogram revealed no pericardial effusion. EBL<25m.  There were no early apparent complications. CONCLUSIONS: 1. Sinus rhythm upon presentation.  2. Successful electrical isolation and anatomical encircling of all four pulmonary veins with radiofrequency current.  A WACA approach was used 3. Additional left atrial ablation was performed with a standard box lesion created along the posterior wall of the left atrium 4. Cavotricuspid isthmus ablation 5. No early apparent complications. WOcie DoyneCamnitz,MD 11:52 AM 05/16/2022  CT CARDIAC MORPH/PULM VEIN W/CM&W/O CA SCORE  Addendum Date: 05/10/2022   ADDENDUM REPORT: 05/10/2022 10:36 ADDENDUM: The following report is an over-read performed by radiologist Dr. JDahlia Bailiffof [facility] [place] May 10, 2022. This over-read does not include interpretation of cardiac or coronary anatomy or pathology. The coronary calcium score/coronary CTA interpretation by the cardiologist is attached. COMPARISON:  Dec 04, 2021 FINDINGS: Vascular: Aortic atherosclerosis. No acute non-cardiac vascular finding. Mediastinum/Nodes: Within the visualized portions of the chest there are no pathologically enlarged mediastinal, or hilar lymph nodes, noting limited sensitivity for the detection of hilar adenopathy on this noncontrast study. Hyperdense material in the distal esophagus Lungs/Pleura: Within the visualized portions of the thorax there are no suspicious appearing pulmonary nodules or masses, there is no acute consolidative airspace disease, no pleural effusions and no pneumothorax Upper Abdomen: Hepatic cysts. Musculoskeletal: Again seen are multiple right lower rib injuries. No acute osseous abnormality. IMPRESSION: 1. No acute findings in the thorax. 2. Hyperdense material in the distal esophagus, presumably ingested material. Suggest correlation for history of dysphagia 3.  Aortic Atherosclerosis (ICD10-I70.0). Electronically Signed   By: JDahlia BailiffM.D.   On: 05/10/2022 10:36   Result Date: 05/10/2022 CLINICAL DATA:  Atrial fibrillation scheduled for ablation. EXAM: Cardiac CTA MEDICATIONS: MEDICATIONS None TECHNIQUE: A non-contrast, gated CT scan was obtained with axial slices of 3 mm through the heart for calcium scoring. Calcium scoring was performed using the Agatston method. A 120 kV retrospective, gated, contrast cardiac scan was obtained. Gantry rotation speed was 250 msecs and collimation was 0.6 mm. Nitroglycerin was not given. A delayed scan was obtained to exclude left atrial appendage thrombus. The 3D dataset was reconstructed in 5% intervals of the 0-95% of the R-R cycle. Late systolic phases were analyzed on a dedicated workstation using MPR, MIP, and VRT modes. The patient received 80 cc of contrast.  FINDINGS: Image quality: Good. Pulmonary Veins: There is normal pulmonary vein drainage into the left atrium (2 on the right and 2 on the left) with ostial measurements as follows: RUPV: Ostium 21.6  X 16 mm  area 2.45  Cm^2 RLPV:  Ostium 20.8 x 17.2 mm  area 2.69 cm^2 LUPV:  Ostium 20.1 x 17.6 mm area 2.76 cm^2 LLPV:  Ostium 21.4 x 13.8 mm  area 2.24 cm^2 Left Atrium: The left atrial size is mildly dilated. There is no PFO/ASD. There is no thrombus in  the left atrial appendage on contrast or delayed imaging. The esophagus runs in the left atrial midline and is not in proximity to any of the pulmonary vein ostia. Coronary Arteries: CAC score of 729 Agatston units, which is 95th percentile for age-, race-, and sex-matched controls. Normal coronary origin. Right dominance. The study was performed without use of NTG and is insufficient for plaque evaluation. Right Atrium: Right atrial size is within normal limits. Right Ventricle: The right ventricular cavity is within normal limits. Left Ventricle: The ventricular cavity size is within normal limits. There are no stigmata of prior infarction. There is no abnormal filling defect. Pericardium: Normal thickness with no significant effusion or calcium present. Pulmonary Artery: Normal caliber without proximal filling defect. Aorta: Normal caliber with no significant disease. Extra-cardiac findings: See attached radiology report for non-cardiac structures. IMPRESSION: 1. There is normal pulmonary vein drainage into the left atrium with ostial measurements above. 2. There is no thrombus in the left atrial appendage. 3. The esophagus runs in the left atrial midline and is not in proximity to any of the pulmonary vein ostia. 4. No PFO/ASD. 5. Normal coronary origin. Right dominance. 6. CAC score of 729 Agatston units which is 95th percentile for age-, race-, and sex-matched controls. This suggests high risk for future cardiac events. Loralie Champagne, MD Electronically Signed:  By: Loralie Champagne M.D. On: 05/09/2022 16:44      Cardiac Studies & Procedures   CARDIAC CATHETERIZATION  CARDIAC CATHETERIZATION 12/24/2021  Narrative Conclusions: Severe single-vessel coronary artery disease with 80-90% mid RCA stenosis.  There is mild-moderate, nonobstructive disease involving the left coronary artery. Low normal left ventricular systolic function (LVEF 81-82%) with upper normal filling pressure (LVEDP 15 mmHg). Successful PCI to mid RCA using Synergy 2.75 x 20 mm drug-eluting stent (postdilated to 3.3 mm) with 0% residual stenosis and TIMI-3 flow.  Recommendations: Admit for overnight observation given lack of transportation to accommodate same-day discharge post PCI. If no evidence of bleeding or vascular injury, restart apixaban tomorrow morning.  Recommend concurrent apixaban and clopidogrel for at least 6 months.  Aspirin can be held after discharge if the patient remains on apixaban and clopidogrel. Continue secondary prevention of coronary artery disease and goal-directed medical therapy for management of chronic HFrEF with recovered ejection fraction.  Nelva Bush, MD [facility]  Findings Coronary Findings Diagnostic  Dominance: Right  Left Main Vessel is large. Vessel is angiographically normal.  Left Anterior Descending Mid LAD lesion is 20% stenosed. The lesion is eccentric.  First Diagonal Branch Vessel is small in size.  Second Diagonal Branch Vessel is small in size.  Third Diagonal Branch Vessel is small in size.  Ramus Intermedius Vessel is moderate in size. Ramus lesion is 20% stenosed.  Left Circumflex Mid Cx lesion is 40% stenosed.  First Obtuse Marginal Branch Vessel is small in size.  Second Obtuse Marginal Branch Vessel is moderate in size.  Lateral Second Obtuse Marginal Branch Vessel is small in size.  Right Coronary Artery Vessel is moderate in size. Mid RCA lesion is 85% stenosed. The lesion is  eccentric.  Right Posterior Descending Artery Vessel is large in size.  Right Posterior Atrioventricular Artery Vessel is moderate in size.  First Right Posterolateral Branch Vessel is small in size.  Second Right Posterolateral Branch Vessel is small in size.  Third Right Posterolateral Branch Vessel is small in size.  Intervention  Mid RCA lesion Stent Lesion length:  15 mm. CATH LAUNCHER [id number] guide catheter was inserted.  Pre-stent angioplasty was performed using a BALLN EUPHORA RX 2.25X10. Maximum pressure:  8 atm. A drug-eluting stent was successfully placed using a SYNERGY XD 2.75X20. Maximum pressure: 16 atm. Post-stent angioplasty was performed using a BALL SAPPHIRE NC24 3.25X15. Maximum pressure:  18 atm. Post-Intervention Lesion Assessment The intervention was successful. Pre-interventional TIMI flow is 3. Post-intervention TIMI flow is 3. No complications occurred at this lesion. There is a 0% residual stenosis post intervention.     ECHOCARDIOGRAM  ECHOCARDIOGRAM COMPLETE 01/26/2021  Narrative ECHOCARDIOGRAM REPORT    Patient Name:   NAPOLEON MONACELLI Date of Exam: 01/26/2021 Medical Rec #:  [id number]      Height:       74.0 in Accession #:    [id number]     Weight:       198.6 lb Date of Birth:  1960-09-05      BSA:          2.167 m Patient Age:    89 years       BP:           152/98 mmHg Patient Gender: M              HR:           69 bpm. Exam Location:  Outpatient  Procedure: 2D Echo, Cardiac Doppler and Color Doppler  Indications:    CHF  History:        Patient has prior history of Echocardiogram examinations, most recent 11/01/2020. CHF, Arrythmias:Atrial Fibrillation, Signs/Symptoms:Alcohol use and Shortness of Breath; Risk Factors:Hypertension and Current Smoker.  Sonographer:    Dustin Flock Referring Phys: [id number] [place]   1. Compared with echo 11/4816, systolic function has improved. 2. Left ventricular  ejection fraction, by estimation, is 45 to 50%. The left ventricle has mildly decreased function. The left ventricle demonstrates global hypokinesis. There is moderate concentric left ventricular hypertrophy. Left ventricular diastolic parameters are consistent with Grade I diastolic dysfunction (impaired relaxation). 3. Right ventricular systolic function is normal. The right ventricular size is normal. 4. The mitral valve is normal in structure. Trivial mitral valve regurgitation. No evidence of mitral stenosis. 5. The aortic valve is tricuspid. Aortic valve regurgitation is not visualized. No aortic stenosis is present. 6. The inferior vena cava is normal in size with greater than 50% respiratory variability, suggesting right atrial pressure of 3 mmHg.  FINDINGS Left Ventricle: Left ventricular ejection fraction, by estimation, is 45 to 50%. The left ventricle has mildly decreased function. The left ventricle demonstrates global hypokinesis. The left ventricular internal cavity size was normal in size. There is moderate concentric left ventricular hypertrophy. Left ventricular diastolic parameters are consistent with Grade I diastolic dysfunction (impaired relaxation). Indeterminate filling pressures.  Right Ventricle: The right ventricular size is normal. No increase in right ventricular wall thickness. Right ventricular systolic function is normal.  Left Atrium: Left atrial size was normal in size.  Right Atrium: Right atrial size was normal in size.  Pericardium: There is no evidence of pericardial effusion.  Mitral Valve: The mitral valve is normal in structure. Trivial mitral valve regurgitation. No evidence of mitral valve stenosis.  Tricuspid Valve: The tricuspid valve is normal in structure. Tricuspid valve regurgitation is trivial. No evidence of tricuspid stenosis.  Aortic Valve: The aortic valve is tricuspid. Aortic valve regurgitation is not visualized. No aortic stenosis is  present.  Pulmonic Valve: The pulmonic valve was normal in structure. Pulmonic valve regurgitation is not visualized. No evidence of pulmonic  stenosis.  Aorta: The aortic root is normal in size and structure.  Venous: The inferior vena cava is normal in size with greater than 50% respiratory variability, suggesting right atrial pressure of 3 mmHg.  IAS/Shunts: No atrial level shunt detected by color flow Doppler.   LEFT VENTRICLE PLAX 2D LVIDd:         5.05 cm      Diastology LVIDs:         3.70 cm      LV e' medial:    4.57 cm/s LV PW:         1.30 cm      LV E/e' medial:  11.2 LV IVS:        1.35 cm      LV e' lateral:   5.00 cm/s LVOT diam:     2.20 cm      LV E/e' lateral: 10.3 LV SV:         65 LV SV Index:   30 LVOT Area:     3.80 cm  LV Volumes (MOD) LV vol d, MOD A4C: 157.0 ml LV vol s, MOD A4C: 92.0 ml LV SV MOD A4C:     157.0 ml  RIGHT VENTRICLE RV Basal diam:  2.50 cm RV S prime:     11.00 cm/s TAPSE (M-mode): 1.5 cm  LEFT ATRIUM             Index       RIGHT ATRIUM           Index LA diam:        3.50 cm 1.62 cm/m  RA Area:     13.60 cm LA Vol (A2C):   45.6 ml 21.04 ml/m RA Volume:   34.40 ml  15.87 ml/m LA Vol (A4C):   41.4 ml 19.10 ml/m LA Biplane Vol: 46.3 ml 21.37 ml/m AORTIC VALVE LVOT Vmax:   97.30 cm/s LVOT Vmean:  55.400 cm/s LVOT VTI:    0.170 m  AORTA Ao Root diam: 3.20 cm  MITRAL VALVE MV Area (PHT): 3.21 cm    SHUNTS MV Decel Time: 236 msec    Systemic VTI:  0.17 m MV E velocity: 51.40 cm/s  Systemic Diam: 2.20 cm MV A velocity: 85.70 cm/s MV E/A ratio:  0.60  Skeet Latch MD Electronically signed by Skeet Latch MD Signature Date/Time: 01/26/2021/6:36:28 PM    Final   TEE  ECHO TEE 11/01/2020  Narrative TRANSESOPHOGEAL ECHO REPORT    Patient Name:   AZIM GILLINGHAM Date of Exam: 11/01/2020 Medical Rec #:  [id number]      Height:       74.0 in Accession #:    [id number]     Weight:       196.4 lb Date of Birth:   11/18/1960      BSA:          2.157 m Patient Age:    53 years       BP:           152/110 mmHg Patient Gender: M              HR:           102 bpm. Exam Location:  Inpatient  Procedure: Transesophageal Echo  Indications:    I48.91* Unspeicified atrial fibrillation  History:        Patient has prior history of Echocardiogram examinations, most recent 10/30/2020. Arrythmias:Atrial Fibrillation and Atrial Flutter; Risk Factors:Hypertension.  Sonographer:  Bernadene Person RDCS Referring Phys: [id number] Darreld Mclean  PROCEDURE: After discussion of the risks and benefits of a TEE, an informed consent was obtained from the patient. The transesophogeal probe was passed without difficulty through the esophogus of the patient. Local oropharyngeal anesthetic was provided with Cetacaine. Sedation performed by different physician. The patient was monitored while under deep sedation. Anesthestetic sedation was provided intravenously by Anesthesiology: 158.58m of Propofol. The patient's vital signs; including heart rate, blood pressure, and oxygen saturation; remained stable throughout the procedure. The patient developed no complications during the procedure. A successful direct current cardioversion was performed at 200 joules with 1 attempt.  IMPRESSIONS   1. Left ventricular ejection fraction, by estimation, is <20%. The left ventricle has severely decreased function. The left ventricle demonstrates global hypokinesis. 2. Right ventricular systolic function is normal. The right ventricular size is normal. 3. Left atrial size was severely dilated. No left atrial/left atrial appendage thrombus was detected. 4. The mitral valve is normal in structure. Mild mitral valve regurgitation. No evidence of mitral stenosis. 5. The aortic valve is normal in structure. Aortic valve regurgitation is trivial. No aortic stenosis is present. 6. The inferior vena cava is normal in size with greater than 50%  respiratory variability, suggesting right atrial pressure of 3 mmHg.  Conclusion(s)/Recommendation(s): No LA/LAA thrombus identified. Successful cardioversion performed with restoration of normal sinus rhythm.  FINDINGS Left Ventricle: Left ventricular ejection fraction, by estimation, is <20%. The left ventricle has severely decreased function. The left ventricle demonstrates global hypokinesis. The left ventricular internal cavity size was normal in size. There is no left ventricular hypertrophy.  Right Ventricle: The right ventricular size is normal. No increase in right ventricular wall thickness. Right ventricular systolic function is normal.  Left Atrium: Left atrial size was severely dilated. Spontaneous echo contrast was present. No left atrial/left atrial appendage thrombus was detected.  Right Atrium: Right atrial size was normal in size.  Pericardium: There is no evidence of pericardial effusion.  Mitral Valve: The mitral valve is normal in structure. Mild mitral valve regurgitation. No evidence of mitral valve stenosis.  Tricuspid Valve: The tricuspid valve is normal in structure. Tricuspid valve regurgitation is mild . No evidence of tricuspid stenosis.  Aortic Valve: The aortic valve is normal in structure. Aortic valve regurgitation is trivial. No aortic stenosis is present.  Pulmonic Valve: The pulmonic valve was normal in structure. Pulmonic valve regurgitation is trivial. No evidence of pulmonic stenosis.  Aorta: The aortic root is normal in size and structure.  Venous: The inferior vena cava is normal in size with greater than 50% respiratory variability, suggesting right atrial pressure of 3 mmHg.  IAS/Shunts: No atrial level shunt detected by color flow Doppler.  MCandee FurbishMD Electronically signed by MCandee FurbishMD Signature Date/Time: 11/01/2020/2:52:23 PM    Final    CT SCANS  CT CORONARY FRACTIONAL FLOW RESERVE DATA PREP  12/06/2021  Narrative EXAM: CT FFR ANALYSIS  CLINICAL DATA:  Coronary artery disease (CAD) (Ped 0-17y)  FINDINGS: FFRct analysis was performed on the original cardiac CT angiogram dataset. Diagrammatic representation of the FFRct analysis is provided in a separate PDF document in PACS. This dictation was created using the PDF document and an interactive 3D model of the results. 3D model is not available in the EMR/PACS. Normal FFR range is >0.80. Indeterminate (grey) zone is 0.76-0.80.  1. Left Main: FFR = 0.99  2. LAD: Proximal FFR = 0.98, mid FFR = 0.92, distal FFR = 0.80  3. LCX: Proximal FFR = 0.94, distal FFR = 0.85 4. RCA: Proximal FFR = 0.98, mid FFR = 0.62, distal FFR = 0.62  IMPRESSION: 1.  CT FFR analysis showed flow limiting lesion in the mid-RCA.  RECOMMENDATIONS: Recommend Cardiac Catheterization.   Electronically Signed By: Berniece Salines D.O. On: 12/06/2021 12:42   CT SCANS  CT CORONARY MORPH W/CTA COR W/SCORE 12/06/2021  Addendum 12/06/2021 12:29 PM ADDENDUM REPORT: 12/06/2021 12:26  ADDENDUM: Addendum: After further review of the mid RCA, the lesion appears to be likely moderate to severe -will send for FFRCt.   Electronically Signed By: Berniece Salines D.O. On: 12/06/2021 12:26  Addendum 12/04/2021  4:23 PM ADDENDUM REPORT: 12/04/2021 16:20  EXAM: OVER-READ INTERPRETATION  CT CHEST  The following report is an over-read performed by radiologist Dr. Jason Nest [facility] on 12/04/2021. This over-read does not include interpretation of cardiac or coronary anatomy or pathology. The coronary CTA interpretation by the cardiologist is attached.  COMPARISON:  None.  FINDINGS: Vascular: No significant extracardiac vascular findings.  Mediastinum/Nodes: No lymphadenopathy  Lungs/Pleura: No focal airspace disease, suspicious pulmonary nodule, pleural effusion, or pneumothorax within the field of view.  Upper Abdomen: There are multiple  cysts in the liver. No acute findings.  Musculoskeletal: Multiple old right lower rib injuries. No acute osseous abnormality. No suspicious bone lesion.  IMPRESSION: No acute or significant incidental extracardiac findings in the chest.   Electronically Signed By: Maurine Simmering M.D. On: 12/04/2021 16:20  Narrative CLINICAL DATA:  This is a 61 year old male with chest pain.  EXAM: Cardiac/Coronary  CTA  TECHNIQUE: The patient was scanned on a [employer].  FINDINGS: A 100 kV prospective scan was triggered in the descending thoracic aorta at 111 HU's. Axial non-contrast 3 mm slices were carried out through the heart. The data set was analyzed on a dedicated work station and scored using the [employer]. Gantry rotation speed was 250 msecs and collimation was .6 mm. No beta blockade and 0.8 mg of sl NTG was given. The 3D data set was reconstructed in 5% intervals of the 67-82 % of the R-R cycle. Diastolic phases were analyzed on a dedicated work station using MPR, MIP and VRT modes. The patient received 80 cc of contrast.  Aorta: Normal size.  No calcifications.  No dissection.  Aortic Valve:  Trileaflet.  No calcifications.  Coronary Arteries:  Normal coronary origin.  Right dominance.  RCA is a large dominant artery that gives rise to PDA and PLA. There is mild (25-49%) focal soft plaque in the mid RCA. The proximal and distal RCA with no plaques.  Left main is a large artery that gives rise to LAD, intermedius ramus and LCX arteries.  LAD is a large vessel. There is mild calcified plaque in the proximal LAD. At the proximal-mid LAD there is a focal mild soft plaque. The mid-distal and distal LAD with no plaques.  Ramus - Mild focal calcification in the proximal portion of the vessel. The mid and distal portion with no plaques.  LCX is a non-dominant artery that gives rise to one large OM1 branch. There is mild mixed plaque in the proximal LCX. The  mid to distal portion of the vessel with focal mild calcified plaque.  Coronary Calcium Score:  Left main: 0  Left anterior descending artery: 46.9  Left circumflex artery: 10.9  Right coronary artery: 7.32  Total: 65.1  Percentile: 67  Other findings:  Normal pulmonary vein drainage  into the left atrium.  Normal left atrial appendage without a thrombus.  Normal size of the pulmonary artery.  IMPRESSION: 1. Coronary calcium score of 65.1. This was 21 percentile for age and sex matched control.  2. Normal coronary origin with right dominance.  3. CAD-RADS 2. Mild non-obstructive CAD (25-49%). Consider non-atherosclerotic causes of chest pain. Consider preventive therapy and risk factor modification.  The noncardiac portion of this study will be interpreted in separate report by the radiologist.  Electronically Signed: By: Berniece Salines D.O. On: 12/04/2021 16:03          EKG: 10/27 1146: atypical atrial flutter, rate 134bpm (personally reviewed)  TELEMETRY: atrial flutter with rates in the 110-130s (personally reviewed)   Assessment/Plan: #) Atrial fibrillation #) h/o afib #) s/p afib ablation 10/12 Patient's history is very convincing that he converted to afib/aflutter evening of 10/25 about 8pm after dinner.  He did miss a signle dose of eliquis ~1.5 weeks ago, but symptoms and observation of tachycardia within the last 48 hrs.  Given that it has been less than 48hours, recommend DCCV in ER, then ok to discharge  Amio 139m bolus, then home on 4077mPO BID x 10 days, then 20071maily CHA2DS2-VASc Score = 3 (CHF, HTN, Vasc dz)  AC with eliquis, continue at 5mg62mD dose Bilateral groin sites with evolving ecchymosis  R groin non-tender, nodule should improve with time Outpatient EP follow-up scheduled   Dr. MealMyles Gip seen the patient, I discussed plan with ED MD    For questions or updates, please contact [email or web address] Please consult [email or web address]  for contact info under Cardiology/STEMI.  Signed, SuzaMamie Levers  05/31/2022 2:34 PM

## 2022-05-31 NOTE — Discharge Instructions (Addendum)
Please be sure to follow-up with your cardiologist as discussed.  Take your newly prescribed amiodarone as well as your other medications as directed.  Return here for concerning changes in your condition.

## 2022-05-31 NOTE — ED Provider Notes (Signed)
[facility] EMERGENCY DEPARTMENT Provider Note   CSN: [id number] Arrival date & time: 05/31/22  1030     History  Chief Complaint  Patient presents with   Shortness of Breath   Weakness   Atrial Fibrillation   Back Pain   Wrist Pain   Shoulder Pain    Christopher Gutierrez is a 61 y.o. male with history significant for A-fib RVR, tobacco use disorder, alcohol dependence, acute systolic CHF, hypercoagulable state, presents to the ER from A-fib clinic due to shortness of breath, palpitations, dizziness, weakness.  He states his symptoms began on Wednesday and have progressively been worsening.  He has not missed any doses of his medication.  He is on dual anticoagulant therapy with Plavix and Eliquis.  Dyspnea is worse with exertion and lying flat.  Also has nonspecific generalized body aches and night sweats.  He becomes lightheaded when moving around, has not had a syncopal episode.  Had ablation on 05/16/2022.  Denies chest pain, syncope, headache, fever, nausea, vomiting, diarrhea, visual disturbance.       Home Medications Prior to Admission medications   Medication Sig Start Date End Date Taking? Authorizing Provider  acetaminophen (TYLENOL) 500 MG tablet Take 500 mg by mouth every 6 (six) hours as needed for moderate pain.   Yes [provider]  amiodarone (PACERONE) 200 MG tablet Take 2 tablets (400 mg total) by mouth 2 (two) times daily for 10 days, THEN 1 tablet (200 mg total) daily. 05/31/22 09/08/22 Yes Riddle, Nunzio Cobbs, NP  apixaban (ELIQUIS) 5 MG TABS tablet TAKE 1 TABLET BY MOUTH TWICE A DAY 01/21/22  Yes [name], MD  bisoprolol (ZEBETA) 10 MG tablet Take 1 tablet (10 mg total) by mouth daily. 01/16/22  Yes [name], MD  cetirizine (ZYRTEC) 10 MG tablet Take 10 mg by mouth as needed for allergies.   Yes [provider]  clopidogrel (PLAVIX) 75 MG tablet TAKE 1 TABLET BY MOUTH EVERY DAY 03/18/22  Yes [name], MD  dapagliflozin propanediol (FARXIGA) 10 MG TABS tablet Take 1 tablet (10 mg total) by mouth daily before breakfast. NEEDS FOLLOW UP APPOINTMENT FOR ANYMORE REFILLS 10/11/21  Yes Monge, Helane Gunther, NP  ENTRESTO 97-103 MG TAKE 1 TABLET BY MOUTH TWICE A DAY 04/23/22  Yes Wellington Hampshire, MD  ezetimibe (ZETIA) 10 MG tablet Take 1 tablet (10 mg total) by mouth daily. 02/01/22  Yes [name], MD  famotidine-calcium carbonate-magnesium hydroxide (PEPCID COMPLETE) 10-800-165 MG chewable tablet Chew 1 tablet by mouth daily as needed (acid reflux).   Yes [provider]  hydrALAZINE (APRESOLINE) 25 MG tablet TAKE 1 TABLET (25 MG) BY MOUTH IN THE MORNING AND AT BEDTIME 04/22/22  Yes [name], MD  Polyvinyl Alcohol-Povidone (REFRESH OP) Place 1 drop into both eyes daily as needed (dry eyes).   Yes [provider]  rosuvastatin (CRESTOR) 40 MG tablet TAKE 1 TABLET BY MOUTH EVERY DAY 05/22/22  Yes Crenshaw, Denice Bors, MD  SIMBRINZA 1-0.2 % SUSP Apply 1 drop to eye 2 (two) times daily. 03/29/22  Yes [provider]  spironolactone (ALDACTONE) 25 MG tablet Take 1 tablet (25 mg total) by mouth daily. 11/22/21 11/23/22 Yes [name], MD  nitroGLYCERIN (NITROSTAT) 0.4 MG SL tablet Place 1 tablet (0.4 mg total) under the tongue every 5 (five) minutes as needed. 12/13/21 05/13/22  [name], MD      Allergies    Beta adrenergic blockers  and Tape    Review of Systems   Review of Systems  Constitutional:  Positive for diaphoresis. Negative for fever.  Eyes:  Negative for visual disturbance.  Respiratory:  Positive for shortness of breath. Negative for chest tightness.   Cardiovascular:  Positive for palpitations. Negative for chest pain and leg swelling.  Gastrointestinal:  Negative for diarrhea, nausea and vomiting.  Skin:  Negative for color change.  Neurological:  Positive for dizziness, weakness and light-headedness.  Negative for syncope.  Psychiatric/Behavioral:  Negative for confusion.     Physical Exam Updated Vital Signs BP 121/64   Pulse (!) 131   Temp 98.1 F (36.7 C) (Oral)   Resp 19   Ht '6\' 2"'$  (1.88 m)   Wt 94.8 kg   SpO2 98%   BMI 26.83 kg/m  Physical Exam Vitals and nursing note reviewed.  Constitutional:      General: He is not in acute distress.    Appearance: He is not ill-appearing.  HENT:     Mouth/Throat:     Mouth: Mucous membranes are moist.     Pharynx: Oropharynx is clear.  Cardiovascular:     Rate and Rhythm: Normal rate and regular rhythm.     Pulses: Normal pulses.     Heart sounds: Normal heart sounds.  Pulmonary:     Effort: Pulmonary effort is normal. No respiratory distress.     Breath sounds: Normal breath sounds.  Abdominal:     General: Abdomen is flat. Bowel sounds are normal. There is no distension.     Palpations: Abdomen is soft.     Tenderness: There is no abdominal tenderness.  Skin:    General: Skin is warm and dry.     Capillary Refill: Capillary refill takes less than 2 seconds.  Neurological:     Mental Status: He is alert. Mental status is at baseline.  Psychiatric:        Mood and Affect: Mood normal.        Behavior: Behavior normal.     ED Results / Procedures / Treatments   Labs (all labs ordered are listed, but only abnormal results are displayed) Labs Reviewed  BASIC METABOLIC PANEL - Abnormal; Notable for the following components:      Result Value   CO2 17 (*)    Glucose, Bld 128 (*)    BUN 23 (*)    Creatinine, Ser 1.51 (*)    GFR, Estimated 53 (*)    All other components within normal limits  CBC - Abnormal; Notable for the following components:   WBC 15.5 (*)    Hemoglobin 18.5 (*)    HCT 56.1 (*)    MCV 101.6 (*)    All other components within normal limits  BRAIN NATRIURETIC PEPTIDE - Abnormal; Notable for the following components:   B Natriuretic Peptide 621.5 (*)    All other components within normal limits   TROPONIN I (HIGH SENSITIVITY) - Abnormal; Notable for the following components:   Troponin I (High Sensitivity) 38 (*)    All other components within normal limits  TROPONIN I (HIGH SENSITIVITY) - Abnormal; Notable for the following components:   Troponin I (High Sensitivity) 40 (*)    All other components within normal limits  MAGNESIUM  PROTIME-INR    EKG None  Radiology DG Chest 1 View  Result Date: 05/31/2022 CLINICAL DATA:  Dizziness. EXAM: CHEST  1 VIEW COMPARISON:  Chest x-ray dated September 01, 2021. FINDINGS: The heart size and mediastinal  contours are within normal limits. Both lungs are clear. No acute osseous abnormality. Old right-sided rib fractures again noted. IMPRESSION: No active disease. Electronically Signed   By: Titus Dubin M.D.   On: 05/31/2022 11:44    Procedures .Critical Care  Performed by: Pat Kocher, PA Authorized by: Pat Kocher, PA   Critical care provider statement:    Critical care time (minutes):  30   Critical care was necessary to treat or prevent imminent or life-threatening deterioration of the following conditions:  Cardiac failure   Critical care was time spent personally by me on the following activities:  Development of treatment plan with patient or surrogate, discussions with consultants, evaluation of patient's response to treatment, examination of patient, ordering and review of laboratory studies, ordering and review of radiographic studies, ordering and performing treatments and interventions, pulse oximetry, re-evaluation of patient's condition, review of old charts and obtaining history from patient or surrogate   Care discussed with: admitting provider       Medications Ordered in ED Medications  sodium chloride 0.9 % bolus 1,000 mL (0 mLs Intravenous Stopped 05/31/22 1436)  propofol (DIPRIVAN) 10 mg/mL bolus/IV push 47.4 mg (47.4 mg Intravenous Given 05/31/22 1625)  amiodarone (NEXTERONE) IV bolus only 150 mg/100 mL  (0 mg Intravenous Stopped 05/31/22 1622)  fentaNYL (SUBLIMAZE) injection 25 mcg (25 mcg Intravenous Given 05/31/22 1622)    ED Course/ Medical Decision Making/ A&P Clinical Course as of 05/31/22 1631  Fri May 31, 2022  1333 Creatinine(!): 1.51 [MC]    Clinical Course User Index [MC] Pat Kocher, PA                           Medical Decision Making Amount and/or Complexity of Data Reviewed Labs: ordered.   This patient presents to the ED with chief complaint(s) of palpitations, shortness of breath worse with exertion, dizziness, weakness with pertinent past medical history of A-fib with RVR, recent ablation on 05/16/22, CHF, alcohol use disorder, tobacco use disorder which further complicates the presenting complaint. The complaint involves an extensive differential diagnosis and treatment options and also carries with it a high risk of complications and morbidity.    The differential diagnosis includes A-fib or atrial flutter with RVR, ACS, PE, CHF  The initial plan is to order ECG, CXR, CBC, BMP, troponin, PT/INR, BNP, magnesium  Additional Tests and treatment considered: Patient is low risk / negative by compliance with dual anticoagulant therapy, therefore do not feel that CTA is indicated at this time.  Additional history obtained: Records reviewed  cardiology notes  Reassessment and review (also see workup area): Lab Tests: I ordered and personally interpreted labs.  The pertinent results include: CBC significant for leukocytosis, macrocytosis, hemoglobin 18.5.  Patient is daily tobacco user.   Metabolic panel significant for elevated creatinine at 1.51, BUN 23, mildly elevated glucose at 128.  Creatinine 4 weeks ago was 1.19.  Has no recent history of impaired kidney function. Initial troponin 38.   PT INR within normal limits. BNP 621.5.  ECG obtained and independently reviewed, demonstrates atrial flutter with rapid ventricular rate at 134.  Imaging Studies: I  ordered and independently visualized and interpreted the following imaging X-ray chest   which showed normal heart size, lung fields clear, no pneumothorax, no pleural effusion The interpretation of the imaging was limited to assessing for emergent pathology, for which purpose it was ordered.  Consultations Obtained: I requested consultation with the  consultant cardiology , and discussed findings as well as pertinent plan - they recommend: synchronized cardioversion  Medicines ordered and prescription drug management: I ordered the following medications fluid bolus for hypotension  I considered this additional medications: Antiarrhythmics   Reevaluation of the patient after these medicines showed that the patient    improved.  BP improved following fluid bolus.   Patient was cardioverted by attending Carmin Muskrat.  Cardiac Monitoring: The patient was maintained on a cardiac monitor.  I personally viewed and interpreted the cardiac monitor which showed an underlying rhythm of:   Atrial flutter with RVR  Complexity of problems addressed: Patient's presentation is most consistent with  severe exacerbation of chronic illness During patient's assessment, cardiac monitor showed heart rate in the 130s, appears regular, likely atrial flutter with RVR.  Heart sounds normal, tachycardic, regular.  LCTAB.  Skin warm and dry.  Pulses normal.  Patient is alert and not ill-appearing or in acute distress.  Concern for tachycardia that has been persistent for a few days.  Placed consultation with cardiology due to persistent A-fib or atrial flutter with RVR.  Due to recent cardiac ablation, will hold off on cardioversion at this time.  Will treat patient based on cardiologist recommendations.  Patient's SBPs consistently in the 90s.  Will give fluid bolus.  Hypotension may also be related to extra dose of bisoprolol patient was instructed to take at home by cardiology.  Disposition: After consideration of  the diagnostic results and the patient's response to treatment, I feel that the patent would benefit from discharge with close outpatient cardiac follow-up.  He will be sent home with prescription for daily amiodarone, ordered by cardiology.  Strict return precautions given to patient.         Final Clinical Impression(s) / ED Diagnoses Final diagnoses:  Atrial flutter with rapid ventricular response [facility])    Rx / DC Orders ED Discharge Orders          Ordered    amiodarone (PACERONE) 200 MG tablet  Multiple Frequencies        05/31/22 1454              Pat Kocher, [place] 05/31/22 1645    Carmin Muskrat, MD 06/03/22 [phone]

## 2022-05-31 NOTE — ED Triage Notes (Signed)
Pt. Stated, I was brought up from the A-fib clinic due to SOB , dizzy, and aching all over , and SOB . The symptoms started on Wednesday and seem to be getting worse.

## 2022-05-31 NOTE — Sedation Documentation (Signed)
1625 gave 50 propofol 1627 gave additional 20 propofol 1628 shocked with 120J

## 2022-05-31 NOTE — ED Notes (Signed)
Taxi voucher given to pt for ride home. Pt is alert and oriented X 4. Pt ambulating independently without difficulty. Per pt, father will be with him at home for at least the next 24 hours. AVS with prescriptions provided to and discussed with patient. Pt verbalizes understanding of discharge instructions and denies any questions or concerns at this time.

## 2022-05-31 NOTE — ED Provider Notes (Signed)
.Cardioversion  Date/Time: 05/31/2022 4:30 PM  Performed by: Carmin Muskrat, MD Authorized by: Carmin Muskrat, MD   Consent:    Consent obtained:  Verbal   Consent given by:  Patient   Risks discussed:  Death, induced arrhythmia and pain   Alternatives discussed:  Rate-control medication, no treatment and delayed treatment Universal protocol:    Procedure explained and questions answered to patient or proxy's satisfaction: yes     Relevant documents present and verified: yes     Imaging studies available: yes     Required blood products, implants, devices, and special equipment available: yes     Site/side marked: yes     Immediately prior to procedure a time out was called: yes     Patient identity confirmed:  Verbally with patient Pre-procedure details:    Cardioversion basis:  Emergent Attempt one:    Cardioversion mode:  Synchronous   Waveform:  Biphasic   Shock (Joules):  120   Shock outcome:  Conversion to normal sinus rhythm Post-procedure details:    Patient status:  Awake   Patient tolerance of procedure:  Tolerated well, no immediate complications .Sedation  Date/Time: 05/31/2022 4:20 PM  Performed by: Carmin Muskrat, MD Authorized by: Carmin Muskrat, MD   Consent:    Consent obtained:  Verbal   Consent given by:  Patient   Risks discussed:  Dysrhythmia, inadequate sedation, nausea, vomiting and respiratory compromise necessitating ventilatory assistance and intubation   Alternatives discussed:  Analgesia without sedation Universal protocol:    Procedure explained and questions answered to patient or proxy's satisfaction: yes     Relevant documents present and verified: yes     Test results available: yes     Imaging studies available: yes     Required blood products, implants, devices, and special equipment available: yes     Site/side marked: yes     Immediately prior to procedure, a time out was called: yes     Patient identity confirmed:  Verbally  with patient Indications:    Procedure performed:  Cardioversion   Procedure necessitating sedation performed by:  Physician performing sedation Pre-sedation assessment:    Time since last food or drink:  5   ASA classification: class 2 - patient with mild systemic disease     Mouth opening:  2 finger widths   Thyromental distance:  2 finger widths   Mallampati score:  II - soft palate, uvula, fauces visible   Neck mobility: normal     Pre-sedation assessments completed and reviewed: airway patency     Pre-sedation assessment completed:  05/31/2022 4:10 PM Immediate pre-procedure details:    Reassessment: Patient reassessed immediately prior to procedure     Reviewed: vital signs     Verified: bag valve mask available, emergency equipment available, intubation equipment available, IV patency confirmed and oxygen available   Procedure details (see MAR for exact dosages):    Preoxygenation:  Nasal cannula   Sedation:  Propofol   Intended level of sedation: deep   Analgesia:  Fentanyl   Intra-procedure monitoring:  Blood pressure monitoring, cardiac monitor, continuous pulse oximetry, continuous capnometry, frequent LOC assessments and frequent vital sign checks   Intra-procedure events: none     Total Provider sedation time (minutes):  20 Post-procedure details:    Post-sedation assessment completed:  05/31/2022 4:41 PM   Attendance: Constant attendance by certified staff until patient recovered     Recovery: Patient returned to pre-procedure baseline     Post-sedation assessments completed and  reviewed: mental status     Patient is stable for discharge or admission: yes     Procedure completion:  Tolerated well, no immediate complications     Carmin Muskrat, MD 05/31/22 1642

## 2022-05-31 NOTE — ED Provider Triage Note (Signed)
Emergency Medicine Provider Triage Evaluation Note  Christopher Gutierrez , a 61 y.o. male  was evaluated in triage.  Pt complains of tachycardia.  Patient sent from A-fib clinic due to A-fib with RVR not controlled with beta-blockers.  He was advised to take an extra dose of bisoprolol yesterday with no improvement of tachycardia.  Patient states he missed a dose of Eliquis 1.5 weeks ago.  Patient advised by A-fib clinic to report to the ED for further evaluation.  Admits to chronic alcoholism.  Admits to drinking 4-5 beers daily.  Last drink alcohol on Wednesday.  No tremors.  Denies hallucinations.  Patient has some chest tightness, shortness of breath, and dizziness.  Review of Systems  Positive: tachycardia Negative: fever  Physical Exam  BP 98/65 (BP Location: Left Arm)   Pulse (!) 135   Temp 97.9 F (36.6 C) (Oral)   Resp 17   SpO2 98%  Gen:   Awake, no distress   Resp:  Normal effort  MSK:   Moves extremities without difficulty  Other:    Medical Decision Making  Medically screening exam initiated at 11:02 AM.  Appropriate orders placed.  Christopher Gutierrez was informed that the remainder of the evaluation will be completed by another provider, this initial triage assessment does not replace that evaluation, and the importance of remaining in the ED until their evaluation is complete.  Labs ordered No tremors on exam. Informed charge RN need for room given A. Fib RVR   Suzy Bouchard, [place] 05/31/22 1108

## 2022-05-31 NOTE — Progress Notes (Signed)
Primary Care Physician: [name], DO Primary Cardiologist: Dr Gardiner Rhyme  Primary Electrophysiologist: Dr Curt Bears Referring Physician: Dr Jenne Campus Christopher Gutierrez is a 60 y.o. male with a history of HTN, tobacco use, systolic CHF, CAD, PAD, atrial flutter, atrial fibrillation who presents for follow up in the [facility]. Patient established with a new PCP Dr. [name] due to ongoing leg pain and shortness of breath.  Ecg done in pcp office which showed afib with RVR and he was referred to cardiology Dr. Gardiner Rhyme. Seen on 10/27/20, remained in afib w/RVR, was offered admission but declined and was started on carvedilol and Eliquis.  Symptoms worsened and he returned to clinic on 10/30/20 and was direct admitted for volume overload. Had an ECHO which demonstrated EF 20-25%, reduced RV function.  Started on IV diuretics with good response and received successful TEE/DCCV with restoration of NSR. Patient is on Eliquis for a CHADS2VASC score of 3. He required DCCV in the ED again 08/2021 and was referred to EP. He is s/p afib and flutter ablation with Dr Curt Bears on 05/16/22.   Patient called triage with elevated heart rates on 05/30/22 and was instructed to take an extra dose of bisoprolol and follow up in the AF clinic. On follow up today, patient reports that he feels SOB at rest and becomes lightheaded frequently. He states that the higher dose of bisoprolol did not slow his heart rate at all. He did miss a dose of Eliquis one and a half weeks ago.  Today, he denies symptoms of chest pain, orthopnea, PND, lower extremity edema, presyncope, syncope, snoring, daytime somnolence, bleeding, or neurologic sequela. The patient is tolerating medications without difficulties and is otherwise without complaint today.    Atrial Fibrillation Risk Factors:  he does have symptoms or diagnosis of sleep apnea. Itamar sleep study ordered.  he does not have a history of  rheumatic fever. he does have a history of alcohol use.   he has a BMI of Body mass index is 26.86 kg/m.Marland Kitchen Filed Weights   05/31/22 1003  Weight: 94.9 kg    Family History  Problem Relation Age of Onset   Gout Brother    Hypertension Brother    COPD Maternal Uncle    Stroke Paternal Aunt    Birth defects Paternal Aunt      Atrial Fibrillation Management history:  Previous antiarrhythmic drugs: none Previous cardioversions: 11/01/20, 09/01/21 Previous ablations: 05/17/22 CHADS2VASC score: 3 Anticoagulation history: Eliquis   Past Medical History:  Diagnosis Date   Atrial fibrillation ([place]) 10/24/2020   Atrial fibrillation and flutter ([place])    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   Hypertension    Past Surgical History:  Procedure Laterality Date   ABDOMINAL AORTOGRAM W/LOWER EXTREMITY N/A 03/07/2021   Procedure: ABDOMINAL AORTOGRAM W/LOWER EXTREMITY;  Surgeon: Wellington Hampshire, MD;  Location: [facility];  Service: Cardiovascular;  Laterality: N/A;   ATRIAL FIBRILLATION ABLATION N/A 05/16/2022   Procedure: ATRIAL FIBRILLATION ABLATION;  Surgeon: Constance Haw, MD;  Location: [place] [facility];  Service: Cardiovascular;  Laterality: N/A;   CARDIOVERSION N/A 11/01/2020   Procedure: CARDIOVERSION;  Surgeon: Jerline Pain, MD;  Location: [facility];  Service: Cardiovascular;  Laterality: N/A;   CATARACT EXTRACTION Right    CORONARY STENT INTERVENTION N/A 12/24/2021   Procedure: CORONARY STENT INTERVENTION;  Surgeon: Nelva Bush, MD;  Location: [place] [facility];  Service: Cardiovascular;  Laterality: N/A;   LEFT HEART CATH AND CORONARY ANGIOGRAPHY N/A 12/24/2021   Procedure: LEFT HEART CATH AND CORONARY ANGIOGRAPHY;  Surgeon: Nelva Bush, MD;  Location: [place];  Service: Cardiovascular;  Laterality: N/A;   MOUTH SURGERY  2013   PERIPHERAL VASCULAR INTERVENTION Left 03/07/2021   Procedure: PERIPHERAL VASCULAR  INTERVENTION;  Surgeon: Wellington Hampshire, MD;  Location: [place] [facility];  Service: Cardiovascular;  Laterality: Left;  left SFA   TEE WITHOUT CARDIOVERSION N/A 11/01/2020   Procedure: TRANSESOPHAGEAL ECHOCARDIOGRAM (TEE);  Surgeon: Jerline Pain, MD;  Location: [facility];  Service: Cardiovascular;  Laterality: N/A;    Current Outpatient Medications  Medication Sig Dispense Refill   acetaminophen (TYLENOL) 500 MG tablet Take 500 mg by mouth every 6 (six) hours as needed for moderate pain.     apixaban (ELIQUIS) 5 MG TABS tablet TAKE 1 TABLET BY MOUTH TWICE A DAY 180 tablet 2   bisoprolol (ZEBETA) 10 MG tablet Take 1 tablet (10 mg total) by mouth daily. 90 tablet 3   cetirizine (ZYRTEC) 10 MG tablet Take 10 mg by mouth as needed for allergies.     clopidogrel (PLAVIX) 75 MG tablet TAKE 1 TABLET BY MOUTH EVERY DAY 90 tablet 3   dapagliflozin propanediol (FARXIGA) 10 MG TABS tablet Take 1 tablet (10 mg total) by mouth daily before breakfast. NEEDS FOLLOW UP APPOINTMENT FOR ANYMORE REFILLS 30 tablet 3   ENTRESTO 97-103 MG TAKE 1 TABLET BY MOUTH TWICE A DAY 60 tablet 6   ezetimibe (ZETIA) 10 MG tablet Take 1 tablet (10 mg total) by mouth daily. 90 tablet 3   famotidine-calcium carbonate-magnesium hydroxide (PEPCID COMPLETE) 10-800-165 MG chewable tablet Chew 1 tablet by mouth daily as needed (acid reflux).     hydrALAZINE (APRESOLINE) 25 MG tablet TAKE 1 TABLET (25 MG) BY MOUTH IN THE MORNING AND AT BEDTIME 180 tablet 0   Polyvinyl Alcohol-Povidone (REFRESH OP) Place 1 drop into both eyes daily as needed (dry eyes).     rosuvastatin (CRESTOR) 40 MG tablet TAKE 1 TABLET BY MOUTH EVERY DAY 90 tablet 0   SIMBRINZA 1-0.2 % SUSP Apply 1 drop to eye 2 (two) times daily.     spironolactone (ALDACTONE) 25 MG tablet Take 1 tablet (25 mg total) by mouth daily. 90 tablet 3   nitroGLYCERIN (NITROSTAT) 0.4 MG SL tablet Place 1 tablet (0.4 mg total) under the tongue every 5 (five) minutes as needed. 25  tablet 3   No current facility-administered medications for this encounter.    Allergies  Allergen Reactions   Beta Adrenergic Blockers Other (See Comments)    Scrotal edema with metoprolol and carvedilol   Tape Rash    Plastic tape    Social History   Socioeconomic History   Marital status: Divorced    Spouse name: Not on file   Number of children: 1   Years of education: Not on file   Highest education level: Master's degree (e.g., MA, MS, MEng, MEd, MSW, MBA)  Occupational History   Not on file  Tobacco Use   Smoking status: Some Days    Packs/day: 1.50    Years: 25.00    Total pack years: 37.50    Types: Cigarettes   Smokeless tobacco: Current    Types: Chew   Tobacco comments:    05/17/21 Ronalee Belts has cut down smoking to 1/2 to 1 ppd.  Not ready to quit.    11/22/2021 Smokes some days    05/31/22  5-6 Cigarettes daily  Vaping Use   Vaping Use: Never used  Substance and Sexual Activity   Alcohol use: Yes    Alcohol/week: 24.0 standard drinks of alcohol    Types: 24 Cans of beer per week    Comment: 6 cans every other day 05/31/22   Drug use: Not Currently    Frequency: 2.0 times per week    Types: Marijuana    Comment: he took Marijuana 2 weeks ago   Sexual activity: Yes    Partners: Female  Other Topics Concern   Not on file  Social History Narrative   Not on file   Social Determinants of Health   Financial Resource Strain: Low Risk  (11/01/2020)   Overall Financial Resource Strain (CARDIA)    Difficulty of Paying Living Expenses: Not hard at all  Food Insecurity: No Food Insecurity (11/01/2020)   Hunger Vital Sign    Worried About Running Out of Food in the Last Year: Never true    Ran Out of Food in the Last Year: Never true  Transportation Needs: No Transportation Needs (11/01/2020)   PRAPARE - Hydrologist (Medical): No    Lack of Transportation (Non-Medical): No  Physical Activity: Not on file  Stress: Not on file   Social Connections: Not on file  Intimate Partner Violence: Not on file     ROS- All systems are reviewed and negative except as per the HPI above.  Physical Exam: Vitals:   05/31/22 1003  BP: (!) 88/70  Pulse: (!) 134  Weight: 94.9 kg  Height: '6\' 2"'$  (1.88 m)    GEN- The patient is a well appearing male, alert and oriented x 3 today.   Head- normocephalic, atraumatic Eyes-  Sclera clear, conjunctiva pink Ears- hearing intact Oropharynx- clear Neck- supple  Lungs- Clear to ausculation bilaterally, breathing mildly labored.  Heart- irregular rate and rhythm, tachycardia, no murmurs, rubs or gallops  GI- soft, NT, ND, + BS Extremities- no clubbing, cyanosis, or edema MS- no significant deformity or atrophy Skin- no rash or lesion Psych- euthymic mood, full affect Neuro- strength and sensation are intact  Wt Readings from Last 3 Encounters:  05/31/22 94.9 kg  05/16/22 95.3 kg  04/25/22 96.3 kg    EKG today demonstrates  Atypical atrial flutter with 2:1 block Vent. rate 134 BPM PR interval 128 ms QRS duration 80 ms QT/QTcB 300/448 ms  Echo 01/26/21 demonstrated   1. Compared with echo 01/629, systolic function has improved.   2. Left ventricular ejection fraction, by estimation, is 45 to 50%. The  left ventricle has mildly decreased function. The left ventricle  demonstrates global hypokinesis. There is moderate concentric left  ventricular hypertrophy. Left ventricular diastolic parameters are consistent with Grade I diastolic dysfunction (impaired relaxation).   3. Right ventricular systolic function is normal. The right ventricular  size is normal.   4. The mitral valve is normal in structure. Trivial mitral valve  regurgitation. No evidence of mitral stenosis.   5. The aortic valve is tricuspid. Aortic valve regurgitation is not  visualized. No aortic stenosis is present.   6. The inferior vena cava is normal in size with greater than 50%  respiratory  variability, suggesting right atrial pressure of 3 mmHg.   Epic records are reviewed at length today  CHA2DS2-VASc Score = 3  The patient's score is based upon: CHF History: 1 HTN History: 1 Diabetes History: 0 Stroke History: 0 Vascular Disease History: 1 Age  Score: 0 Gender Score: 0       ASSESSMENT AND PLAN: 1. Persistent Atrial Fibrillation/atrial flutter The patient's CHA2DS2-VASc score is 3, indicating a 3.2% annual risk of stroke.   S/p afib and flutter ablation 05/17/22 Unfortunately, patient back in rapid atrial flutter with symptoms of dizziness and SOB at rest. His heart rate did not respond to extra dose of BB. Patient agrees with ED evaluation given his low BP and rapid rates.  Continue Eliquis 5 mg BID. He did miss a dose of Eliquis 1.5 weeks ago. Continue bisoprolol 10 mg daily  2. Secondary Hypercoagulable State (ICD10:  D68.69) The patient is at significant risk for stroke/thromboembolism based upon his CHA2DS2-VASc Score of 3.  Continue Apixaban (Eliquis).   3. Suspected obstructive sleep apnea The importance of adequate treatment of sleep apnea was discussed today in order to improve our ability to maintain sinus rhythm long term. Itamar sleep study has already been ordered per Dr Gardiner Rhyme.  4. CAD S/p PCI with DES to RCA  5. HFrEF EF improved from 20-25% to 45-50% with SR. Fluid status appears stable today.  6. HTN Low today, likely due to rapid rates.    Patient taken to ED for evaluation. Follow up in the AF clinic as scheduled.    [facility] [address] [place], [place] [address] [phone] 05/31/2022 10:24 AM

## 2022-05-31 NOTE — Progress Notes (Addendum)
RT at bedside for cardioversion. Ambu bag hooked up if needed.

## 2022-05-31 NOTE — Sedation Documentation (Signed)
Dr. Vanita Panda, RT, RN X 2 at bedside for cardioversion. Suction and BVM set up at bedside. ETCO2 [place] on patient and connected to monitor. Code cart at bedside. Pads placed on patient. IV patent, IVF bag hung and ready to begin. Pt remains on ED cardiac monitor with SaO2 monitor and BP cycling Q2 minutes. Time out performed.

## 2022-06-03 ENCOUNTER — Ambulatory Visit: Payer: 59

## 2022-06-03 ENCOUNTER — Telehealth: Payer: Self-pay

## 2022-06-03 DIAGNOSIS — Z0000 Encounter for general adult medical examination without abnormal findings: Secondary | ICD-10-CM

## 2022-06-03 NOTE — Telephone Encounter (Signed)
Patient stated that he was still not feeling well today due to the afib episode he experienced on 10/27 and requested his appointment to be rescheduled. Patient has been rescheduled for 11/2 at 2:00pm. Patient will be called at this time.   Dann Galicia [facility] Guide, Health Coach [address] [place] [address] Telephone: [phone] Email: [email or web address]

## 2022-06-06 ENCOUNTER — Ambulatory Visit: Payer: 59

## 2022-06-06 ENCOUNTER — Telehealth: Payer: Self-pay

## 2022-06-06 NOTE — Telephone Encounter (Signed)
I called the patient to let him know that his appointment was canceled due to provider out of the office. I advised him that he will get a call to reschedule.

## 2022-06-14 ENCOUNTER — Encounter (HOSPITAL_COMMUNITY): Payer: Self-pay | Admitting: Physician Assistant

## 2022-06-14 ENCOUNTER — Ambulatory Visit (HOSPITAL_COMMUNITY)
Admit: 2022-06-14 | Discharge: 2022-06-14 | Disposition: A | Discharge status: Home / Self Care | Payer: 59 | Source: Ambulatory Visit | Attending: Physician Assistant | Admitting: Physician Assistant

## 2022-06-14 VITALS — BP 128/80 | HR 63 | Ht 74.0 in | Wt 213.2 lb

## 2022-06-14 DIAGNOSIS — F17210 Nicotine dependence, cigarettes, uncomplicated: Secondary | ICD-10-CM | POA: Diagnosis not present

## 2022-06-14 DIAGNOSIS — Z7901 Long term (current) use of anticoagulants: Secondary | ICD-10-CM | POA: Diagnosis not present

## 2022-06-14 DIAGNOSIS — I110 Hypertensive heart disease with heart failure: Secondary | ICD-10-CM | POA: Diagnosis not present

## 2022-06-14 DIAGNOSIS — Z79899 Other long term (current) drug therapy: Secondary | ICD-10-CM | POA: Insufficient documentation

## 2022-06-14 DIAGNOSIS — I2510 Atherosclerotic heart disease of native coronary artery without angina pectoris: Secondary | ICD-10-CM | POA: Insufficient documentation

## 2022-06-14 DIAGNOSIS — I4819 Other persistent atrial fibrillation: Secondary | ICD-10-CM | POA: Diagnosis present

## 2022-06-14 DIAGNOSIS — I4892 Unspecified atrial flutter: Secondary | ICD-10-CM | POA: Diagnosis not present

## 2022-06-14 DIAGNOSIS — I5022 Chronic systolic (congestive) heart failure: Secondary | ICD-10-CM | POA: Insufficient documentation

## 2022-06-14 DIAGNOSIS — D6869 Other thrombophilia: Secondary | ICD-10-CM | POA: Diagnosis not present

## 2022-06-14 NOTE — Progress Notes (Signed)
Primary Care Physician: [name], DO Primary Cardiologist: Dr Gardiner Rhyme  Primary Electrophysiologist: Dr Curt Bears Referring Physician: Dr Jenne Campus Christopher Gutierrez is a 61 y.o. male with a history of HTN, tobacco use, systolic CHF, CAD, PAD, atrial flutter, atrial fibrillation who presents for follow up in the [facility]. Patient established with a new PCP Dr. [name] due to ongoing leg pain and shortness of breath.  Ecg done in pcp office which showed afib with RVR and he was referred to cardiology Dr. Gardiner Rhyme. Seen on 10/27/20, remained in afib w/RVR, was offered admission but declined and was started on carvedilol and Eliquis.  Symptoms worsened and he returned to clinic on 10/30/20 and was direct admitted for volume overload. Had an ECHO which demonstrated EF 20-25%, reduced RV function.  Started on IV diuretics with good response and received successful TEE/DCCV with restoration of NSR. Patient is on Eliquis for a CHADS2VASC score of 3. He required DCCV in the ED again 08/2021 and was referred to EP. He is s/p afib and flutter ablation with Dr Curt Bears on 05/16/22.   Patient called triage with elevated heart rates on 05/30/22 and was instructed to take an extra dose of bisoprolol and follow up in the AF clinic. At his AF clinic appointment his heart rates were in the 130-140 bpm with low BP and he was taken to the ED where he underwent DCCV and was started on amiodarone by EP.   On follow up today, patient reports that he was tired after his ED visit but feels he is back to baseline now. He has not had any other afib episodes. He is in SR today. He denies chest pain, swallowing pain, or groin issues.   Today, he denies symptoms of palpitations, chest pain, orthopnea, PND, lower extremity edema, presyncope, syncope, snoring, daytime somnolence, bleeding, or neurologic sequela. The patient is tolerating medications without difficulties and is otherwise  without complaint today.    Atrial Fibrillation Risk Factors:  he does have symptoms or diagnosis of sleep apnea. Itamar sleep study ordered.  he does not have a history of rheumatic fever. he does have a history of alcohol use.   he has a BMI of Body mass index is 27.37 kg/m.Marland Kitchen Filed Weights   06/14/22 1007  Weight: 96.7 kg    Family History  Problem Relation Age of Onset   Gout Brother    Hypertension Brother    COPD Maternal Uncle    Stroke Paternal Aunt    Birth defects Paternal Aunt      Atrial Fibrillation Management history:  Previous antiarrhythmic drugs: none Previous cardioversions: 11/01/20, 09/01/21 Previous ablations: 05/17/22 CHADS2VASC score: 3 Anticoagulation history: Eliquis   Past Medical History:  Diagnosis Date   Atrial fibrillation ([place]) 10/24/2020   Atrial fibrillation and flutter ([place])    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   Hypertension    Past Surgical History:  Procedure Laterality Date   ABDOMINAL AORTOGRAM W/LOWER EXTREMITY N/A 03/07/2021   Procedure: ABDOMINAL AORTOGRAM W/LOWER EXTREMITY;  Surgeon: Wellington Hampshire, MD;  Location: [place] [facility];  Service: Cardiovascular;  Laterality: N/A;   ATRIAL FIBRILLATION ABLATION N/A 05/16/2022   Procedure: ATRIAL FIBRILLATION ABLATION;  Surgeon: Constance Haw, MD;  Location: [place] [facility];  Service: Cardiovascular;  Laterality: N/A;   CARDIOVERSION N/A 11/01/2020   Procedure: CARDIOVERSION;  Surgeon: Jerline Pain, MD;  Location: [facility];  Service:  Cardiovascular;  Laterality: N/A;   CATARACT EXTRACTION Right    CORONARY STENT INTERVENTION N/A 12/24/2021   Procedure: CORONARY STENT INTERVENTION;  Surgeon: Nelva Bush, MD;  Location: [place] [facility];  Service: Cardiovascular;  Laterality: N/A;   LEFT HEART CATH AND CORONARY ANGIOGRAPHY N/A 12/24/2021   Procedure: LEFT HEART CATH AND CORONARY ANGIOGRAPHY;  Surgeon: Nelva Bush, MD;   Location: [place] [facility];  Service: Cardiovascular;  Laterality: N/A;   MOUTH SURGERY  2013   PERIPHERAL VASCULAR INTERVENTION Left 03/07/2021   Procedure: PERIPHERAL VASCULAR INTERVENTION;  Surgeon: Wellington Hampshire, MD;  Location: [place] [facility];  Service: Cardiovascular;  Laterality: Left;  left SFA   TEE WITHOUT CARDIOVERSION N/A 11/01/2020   Procedure: TRANSESOPHAGEAL ECHOCARDIOGRAM (TEE);  Surgeon: Jerline Pain, MD;  Location: [facility];  Service: Cardiovascular;  Laterality: N/A;    Current Outpatient Medications  Medication Sig Dispense Refill   acetaminophen (TYLENOL) 500 MG tablet Take 500 mg by mouth every 6 (six) hours as needed for moderate pain.     amiodarone (PACERONE) 200 MG tablet Take 2 tablets (400 mg total) by mouth 2 (two) times daily for 10 days, THEN 1 tablet (200 mg total) daily. 130 tablet 0   apixaban (ELIQUIS) 5 MG TABS tablet TAKE 1 TABLET BY MOUTH TWICE A DAY 180 tablet 2   bisoprolol (ZEBETA) 10 MG tablet Take 1 tablet (10 mg total) by mouth daily. 90 tablet 3   cetirizine (ZYRTEC) 10 MG tablet Take 10 mg by mouth as needed for allergies.     clopidogrel (PLAVIX) 75 MG tablet TAKE 1 TABLET BY MOUTH EVERY DAY 90 tablet 3   dapagliflozin propanediol (FARXIGA) 10 MG TABS tablet Take 1 tablet (10 mg total) by mouth daily before breakfast. NEEDS FOLLOW UP APPOINTMENT FOR ANYMORE REFILLS 30 tablet 3   ENTRESTO 97-103 MG TAKE 1 TABLET BY MOUTH TWICE A DAY 60 tablet 6   ezetimibe (ZETIA) 10 MG tablet Take 1 tablet (10 mg total) by mouth daily. 90 tablet 3   famotidine-calcium carbonate-magnesium hydroxide (PEPCID COMPLETE) 10-800-165 MG chewable tablet Chew 1 tablet by mouth daily as needed (acid reflux).     hydrALAZINE (APRESOLINE) 25 MG tablet TAKE 1 TABLET (25 MG) BY MOUTH IN THE MORNING AND AT BEDTIME 180 tablet 0   Polyvinyl Alcohol-Povidone (REFRESH OP) Place 1 drop into both eyes daily as needed (dry eyes).     rosuvastatin (CRESTOR) 40 MG tablet TAKE 1  TABLET BY MOUTH EVERY DAY 90 tablet 0   SIMBRINZA 1-0.2 % SUSP Apply 1 drop to eye 2 (two) times daily.     spironolactone (ALDACTONE) 25 MG tablet Take 1 tablet (25 mg total) by mouth daily. 90 tablet 3   nitroGLYCERIN (NITROSTAT) 0.4 MG SL tablet Place 1 tablet (0.4 mg total) under the tongue every 5 (five) minutes as needed. 25 tablet 3   No current facility-administered medications for this encounter.    Allergies  Allergen Reactions   Beta Adrenergic Blockers Other (See Comments)    Scrotal edema with metoprolol and carvedilol   Tape Rash    Plastic tape    Social History   Socioeconomic History   Marital status: Divorced    Spouse name: Not on file   Number of children: 1   Years of education: Not on file   Highest education level: Master's degree (e.g., MA, MS, MEng, MEd, MSW, MBA)  Occupational History   Not on file  Tobacco Use  Smoking status: Some Days    Packs/day: 1.50    Years: 25.00    Total pack years: 37.50    Types: Cigarettes   Smokeless tobacco: Current    Types: Chew   Tobacco comments:    05/17/21 Ronalee Belts has cut down smoking to 1/2 to 1 ppd.  Not ready to quit.    11/22/2021 Smokes some days    05/31/22 5-6 Cigarettes daily  Vaping Use   Vaping Use: Never used  Substance and Sexual Activity   Alcohol use: Yes    Alcohol/week: 24.0 standard drinks of alcohol    Types: 24 Cans of beer per week    Comment: 6 cans every other day 05/31/22   Drug use: Not Currently    Frequency: 2.0 times per week    Types: Marijuana    Comment: he took Marijuana 2 weeks ago   Sexual activity: Yes    Partners: Female  Other Topics Concern   Not on file  Social History Narrative   Not on file   Social Determinants of Health   Financial Resource Strain: Low Risk  (11/01/2020)   Overall Financial Resource Strain (CARDIA)    Difficulty of Paying Living Expenses: Not hard at all  Food Insecurity: No Food Insecurity (11/01/2020)   Hunger Vital Sign    Worried  About Running Out of Food in the Last Year: Never true    Ran Out of Food in the Last Year: Never true  Transportation Needs: No Transportation Needs (11/01/2020)   PRAPARE - Hydrologist (Medical): No    Lack of Transportation (Non-Medical): No  Physical Activity: Not on file  Stress: Not on file  Social Connections: Not on file  Intimate Partner Violence: Not on file     ROS- All systems are reviewed and negative except as per the HPI above.  Physical Exam: Vitals:   06/14/22 1007  BP: 128/80  Pulse: 63  Weight: 96.7 kg  Height: '6\' 2"'$  (1.88 m)    GEN- The patient is a well appearing male, alert and oriented x 3 today.   HEENT-head normocephalic, atraumatic, sclera clear, conjunctiva pink, hearing intact, trachea midline. Lungs- Clear to ausculation bilaterally, normal work of breathing Heart- Regular rate and rhythm, no murmurs, rubs or gallops  GI- soft, NT, ND, + BS Extremities- no clubbing, cyanosis, or edema MS- no significant deformity or atrophy Skin- no rash or lesion Psych- euthymic mood, full affect Neuro- strength and sensation are intact   Wt Readings from Last 3 Encounters:  06/14/22 96.7 kg  05/31/22 94.8 kg  05/31/22 94.9 kg    EKG today demonstrates  SR, PAC Vent. rate 63 BPM PR interval 182 ms QRS duration 100 ms QT/QTcB 442/452 ms  Echo 01/26/21 demonstrated   1. Compared with echo 08/5398, systolic function has improved.   2. Left ventricular ejection fraction, by estimation, is 45 to 50%. The  left ventricle has mildly decreased function. The left ventricle  demonstrates global hypokinesis. There is moderate concentric left  ventricular hypertrophy. Left ventricular diastolic parameters are consistent with Grade I diastolic dysfunction (impaired relaxation).   3. Right ventricular systolic function is normal. The right ventricular  size is normal.   4. The mitral valve is normal in structure. Trivial mitral valve   regurgitation. No evidence of mitral stenosis.   5. The aortic valve is tricuspid. Aortic valve regurgitation is not  visualized. No aortic stenosis is present.   6. The inferior  vena cava is normal in size with greater than 50%  respiratory variability, suggesting right atrial pressure of 3 mmHg.   Epic records are reviewed at length today  CHA2DS2-VASc Score = 3  The patient's score is based upon: CHF History: 1 HTN History: 1 Diabetes History: 0 Stroke History: 0 Vascular Disease History: 1 Age Score: 0 Gender Score: 0       ASSESSMENT AND PLAN: 1. Persistent Atrial Fibrillation/atrial flutter The patient's CHA2DS2-VASc score is 3, indicating a 3.2% annual risk of stroke.   S/p afib and flutter ablation 05/17/22 Underwent DCCV in ED 05/31/22, started on amiodarone. Patient appears to be maintaining SR.  Continue amiodarone 200 mg daily. Hopefully, this will be short term post ablation.  Continue Eliquis 5 mg BID with no missed doses for 3 months post ablation.  Continue bisoprolol 10 mg daily We discussed reducing alcohol intake.   2. Secondary Hypercoagulable State (ICD10:  D68.69) The patient is at significant risk for stroke/thromboembolism based upon his CHA2DS2-VASc Score of 3.  Continue Apixaban (Eliquis).   3. Suspected obstructive sleep apnea Itamar sleep study has already been ordered per Dr Gardiner Rhyme. Encouraged patient to proceed with study soon.   4. CAD S/p PCI with DES to RCA No anginal symptoms.  5. HFrEF EF improved from 20-25% to 45-50% with SR. Appears euvolemic.   6. HTN Stable, no changes today.   Follow up with Dr Gardiner Rhyme and Dr Curt Bears as scheduled.    [facility] [address] [place], [place] [address] [phone] 06/14/2022 10:12 AM

## 2022-06-25 ENCOUNTER — Other Ambulatory Visit (HOSPITAL_COMMUNITY): Payer: Self-pay

## 2022-06-25 ENCOUNTER — Ambulatory Visit (INDEPENDENT_AMBULATORY_CARE_PROVIDER_SITE_OTHER): Payer: 59 | Admitting: Psychologist

## 2022-06-25 DIAGNOSIS — F411 Generalized anxiety disorder: Secondary | ICD-10-CM

## 2022-06-25 NOTE — Progress Notes (Signed)
[facility] Counselor/Therapist Progress Note  Patient ID: Christopher Gutierrez, MRN: [id number],    Date: 06/25/2022  Time Spent: 02:05 pm to 02:32 pm; total time; 27 minutes   This session was held via in person. The patient consented to in-person therapy and was in the clinician's office. Limits of confidentiality were discussed with the patient.   Treatment Type: Individual Therapy  Reported Symptoms: Depressed  Mental Status Exam: Appearance:  Well Groomed     Behavior: Appropriate  Motor: Normal  Speech/Language:  Slow  Affect: Flat  Mood: constricted  Thought process: normal  Thought content:   WNL  Sensory/Perceptual disturbances:   WNL  Orientation: oriented to person, place, and time/date  Attention: Good  Concentration: Good  Memory: WNL  Fund of knowledge:  Good  Insight:   Poor  Judgment:  Fair  Impulse Control: Good   Risk Assessment: Danger to Self:  No Self-injurious Behavior: No Danger to Others: No Duty to Warn:no Physical Aggression / Violence:No  Access to Firearms a concern: No  Gang Involvement:No   Subjective: Beginning the session, patient disclosed that he has experienced another Afib episode since the last session. He reflected on this experience. From there, he talked about what he wants to do after Thanskgiving. Patient stated that he wants to socialize more, and be more active. Patient spent time reflecting on ways he could accomplish those tasks.He was agreeable to the homework and following up. He denied suicidal and homicidal ideation.    Interventions:  Worked on developing a therapeutic relationship with the patient using active listening and reflective statements. Provided emotional support using empathy and validation. Reviewed the treatment plan with the patient. Used summary and reflective statements. Processed patient's experience with Afib. Validated expressed thoughts. Explored whether or not patient had completed  the homework from the previous session. Identified goals for the session. Used socratic questions to assist the patient gain insight into self. Explored ways that the patient could socialize and be active again. Encouraged patient to contact cardiologist about referral for cardiac rehabilitation. Assisted in problem solving. Assigned homework. Provided empathic statements.Assessed for suicidal and homicidal ideation.   Homework: Explore the potential opportunity to join a book club; work on walking a mile every other day starting November 28th; Contact cardiologist about referral for cardio rehabilitation.  Next Session: Review homework and emotional support  Diagnosis: F41.1 generalized anxiety disorder  Plan:  Goals Work through the grieving process and face reality of own death Accept emotional support from others around them Live life to the fullest, event though time may be limited Become as knowledgeable about the medical condition  Reduce fear, anxiety about the health condition  Accept the illness Accept the role of psychological and behavioral factors  Stabilize anxiety level wile increasing ability to function Learn and implement coping skills that result in a reduction of anxiety  Alleviate depressive symptoms Recognize, accept, and cope with depressive feelings Develop healthy thinking patterns Develop healthy interpersonal relationships  Objectives target date for all objectives is 03/20/2023 Identify feelings associated with the illness Family members share with each other feelings Identify the losses or limitations that have been experienced Verbalize acceptance of the reality of the medical condition Commit to learning and implement a proactive approach to managing personal stresses Verbalize an understanding of the medical condition Work with therapist to develop a plan for coping with stress Learn and implement skills for managing stress Engage in social, productive  activities that are possible Engage in faith  based activities implement positive imagery Identify coping skills and sources of emotional support Patient's partner and family members verbalize their fears regarding severity of health condition Identify sources of emotional distress  Learning and implement calming skills to reduce overall anxiety Learn and implement problem solving strategies Identify and engage in pleasant activities Learning and implement personal and interpersonal skills to reduce anxiety and improve interpersonal relationships Learn to accept limitations in life and commit to tolerating, rather than avoiding, unpleasant emotions while accomplishing meaningful goals Identify major life conflicts from the past and present that form the basis for present anxiety Learn and implement behavioral strategies Verbalize an understanding and resolution of current interpersonal problems Learn and implement problem solving and decision making skills Learn and implement conflict resolution skills to resolve interpersonal problems Verbalize an understanding of healthy and unhealthy emotions verbalize insight into how past relationships may be influence current experiences with depression Use mindfulness and acceptance strategies and increase value based behavior  Increase hopeful statements about the future.   Interventions Teach about stress and ways to handle stress Assist the patient in developing a coping action plan for stressors Conduct skills based training for coping strategies Train problem focused skills Sort out what activities the individual can do Encourage patient to rely upon his/her spiritual faith Teach the patient to use guided imagery Probe and evaluate family's ability to provide emotional support Allow family to share their fears Assist the patient in identifying, sorting through, and verbalizing the various feelings generated by his/her medical condition Meet  with family members  Ask patient list out limitations  Use stress inoculation training  Use Acceptance and Commitment Therapy to help client accept uncomfortable realities in order to accomplish value-consistent goals Reinforce the client's insight into the role of his/her past emotional pain and present anxiety  Discuss examples demonstrating that unrealistic worry overestimates the probability of threats and underestimate patient's ability  Assist the patient in analyzing his or her worries Help patient understand that avoidance is reinforcing  Behavioral activation help the client explore the relationship, nature of the dispute,  Help the client develop new interpersonal skills and relationships Conduct Problem so living therapy Teach conflict resolution skills Use a process-experiential approach Conduct TLDP Conduct ACT  The patient and clinician reviewed the treatment plan on 04/24/2022. The patient approved of the treatment plan.   Conception Chancy, PsyD

## 2022-06-26 ENCOUNTER — Other Ambulatory Visit (HOSPITAL_COMMUNITY): Payer: Self-pay

## 2022-07-03 ENCOUNTER — Encounter (HOSPITAL_COMMUNITY): Payer: Self-pay

## 2022-07-03 ENCOUNTER — Telehealth (HOSPITAL_COMMUNITY): Payer: Self-pay

## 2022-07-03 NOTE — Telephone Encounter (Signed)
Attempted to call patient in regards to Cardiac Rehab - LM on VM Mailed letter 

## 2022-07-16 ENCOUNTER — Telehealth (HOSPITAL_COMMUNITY): Payer: Self-pay

## 2022-07-18 ENCOUNTER — Encounter (HOSPITAL_COMMUNITY)
Admission: RE | Admit: 2022-07-18 | Discharge: 2022-07-18 | Disposition: A | Discharge status: Home / Self Care | Payer: 59 | Source: Ambulatory Visit | Attending: Cardiology | Admitting: Cardiology

## 2022-07-18 ENCOUNTER — Encounter (HOSPITAL_COMMUNITY): Payer: Self-pay

## 2022-07-18 VITALS — BP 118/62 | HR 55 | Ht 74.0 in | Wt 211.4 lb

## 2022-07-18 DIAGNOSIS — Z955 Presence of coronary angioplasty implant and graft: Secondary | ICD-10-CM | POA: Diagnosis present

## 2022-07-18 DIAGNOSIS — Z48812 Encounter for surgical aftercare following surgery on the circulatory system: Secondary | ICD-10-CM | POA: Diagnosis not present

## 2022-07-18 HISTORY — DX: Hyperlipidemia, unspecified: E78.5

## 2022-07-18 HISTORY — DX: Atherosclerotic heart disease of native coronary artery without angina pectoris: I25.10

## 2022-07-18 HISTORY — DX: Heart failure, unspecified: I50.9

## 2022-07-18 NOTE — Progress Notes (Signed)
Cardiac Individual Treatment Plan  Patient Details  Name: Christopher Gutierrez MRN: [id number] Date of Birth: 03/03/1961 Referring Provider:   Flowsheet Row INTENSIVE CARDIAC REHAB ORIENT from 07/18/2022 in [facility] [place]  Referring Provider Dr Oswaldo Milian, MD       Initial Encounter Date:  [place] from 07/18/2022 in [facility]  Date 07/18/22       Visit Diagnosis: 12/24/21 S/P DES RCA  Patient's Home Medications on Admission:  Current Outpatient Medications:    acetaminophen (TYLENOL) 500 MG tablet, Take 500 mg by mouth every 6 (six) hours as needed for moderate pain., Disp: , Rfl:    amiodarone (PACERONE) 200 MG tablet, Take 2 tablets (400 mg total) by mouth 2 (two) times daily for 10 days, THEN 1 tablet (200 mg total) daily., Disp: 130 tablet, Rfl: 0   apixaban (ELIQUIS) 5 MG TABS tablet, TAKE 1 TABLET BY MOUTH TWICE A DAY, Disp: 180 tablet, Rfl: 2   bisoprolol (ZEBETA) 10 MG tablet, Take 1 tablet (10 mg total) by mouth daily., Disp: 90 tablet, Rfl: 3   cetirizine (ZYRTEC) 10 MG tablet, Take 10 mg by mouth as needed for allergies., Disp: , Rfl:    clopidogrel (PLAVIX) 75 MG tablet, TAKE 1 TABLET BY MOUTH EVERY DAY, Disp: 90 tablet, Rfl: 3   dapagliflozin propanediol (FARXIGA) 10 MG TABS tablet, Take 1 tablet (10 mg total) by mouth daily before breakfast. NEEDS FOLLOW UP APPOINTMENT FOR ANYMORE REFILLS, Disp: 30 tablet, Rfl: 3   ENTRESTO 97-103 MG, TAKE 1 TABLET BY MOUTH TWICE A DAY, Disp: 60 tablet, Rfl: 6   ezetimibe (ZETIA) 10 MG tablet, Take 1 tablet (10 mg total) by mouth daily., Disp: 90 tablet, Rfl: 3   famotidine-calcium carbonate-magnesium hydroxide (PEPCID COMPLETE) 10-800-165 MG chewable tablet, Chew 1 tablet by mouth daily as needed (acid reflux)., Disp: , Rfl:    hydrALAZINE (APRESOLINE) 25 MG tablet, TAKE 1  TABLET (25 MG) BY MOUTH IN THE MORNING AND AT BEDTIME, Disp: 180 tablet, Rfl: 0   nitroGLYCERIN (NITROSTAT) 0.4 MG SL tablet, Place 1 tablet (0.4 mg total) under the tongue every 5 (five) minutes as needed., Disp: 25 tablet, Rfl: 3   Polyvinyl Alcohol-Povidone (REFRESH OP), Place 1 drop into both eyes daily as needed (dry eyes)., Disp: , Rfl:    rosuvastatin (CRESTOR) 40 MG tablet, TAKE 1 TABLET BY MOUTH EVERY DAY, Disp: 90 tablet, Rfl: 0   SIMBRINZA 1-0.2 % SUSP, Apply 1 drop to eye 2 (two) times daily., Disp: , Rfl:    spironolactone (ALDACTONE) 25 MG tablet, Take 1 tablet (25 mg total) by mouth daily., Disp: 90 tablet, Rfl: 3  Past Medical History: Past Medical History:  Diagnosis Date   Atrial fibrillation ([place]) 10/24/2020   Atrial fibrillation and flutter ([facility])    CHF (congestive heart failure) ([place])    Coronary artery disease    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   Hyperlipidemia    Hypertension     Tobacco Use: Social History   Tobacco Use  Smoking Status Every Day   Packs/day: 1.50   Years: 25.00   Total pack years: 37.50   Types: Cigarettes  Smokeless Tobacco Current   Types: Chew  Tobacco Comments   07/18/22 Smokes 1/2 ppd not ready to quit given smoking cessation information    Labs: Review Flowsheet  More data exists      Latest Ref Rng & Units 10/31/2020 01/26/2021 11/22/2021 01/16/2022 04/25/2022  Labs for ITP Cardiac and Pulmonary Rehab  Cholestrol 100 - 199 mg/dL 153  229  181  185  143   LDL (calc) 0 - 99 mg/dL 103  139  95  95  59   HDL-C >39 mg/dL 36  69  65  72  65   Trlycerides 0 - 149 mg/dL 68  105  119  99  102     Capillary Blood Glucose: No results found for: "GLUCAP"   Exercise Target Goals: Exercise Program Goal: Individual exercise prescription set using results from initial 6 min walk test and THRR while considering  patient's activity barriers and safety.   Exercise Prescription Goal: Initial exercise  prescription builds to 30-45 minutes a day of aerobic activity, 2-3 days per week.  Home exercise guidelines will be given to patient during program as part of exercise prescription that the participant will acknowledge.  Activity Barriers & Risk Stratification:  Activity Barriers & Cardiac Risk Stratification - 07/18/22 1451       Activity Barriers & Cardiac Risk Stratification   Activity Barriers Deconditioning;Muscular Weakness;Back Problems;Balance Concerns    Cardiac Risk Stratification High             6 Minute Walk:  6 Minute Walk     Row Name 07/18/22 1445         6 Minute Walk   Phase Initial     Distance 1320 feet     Walk Time 6 minutes     # of Rest Breaks 0     MPH 2.5     METS 3.47     RPE 14     Perceived Dyspnea  0     VO2 Peak 12.14     Symptoms Yes (comment)     Comments Bilateral calf tightness/pain 3/10 resolved with rest     Resting HR 55 bpm     Resting BP 118/62     Resting Oxygen Saturation  97 %     Exercise Oxygen Saturation  during 6 min walk 98 %     Max Ex. HR 80 bpm     Max Ex. BP 130/74     2 Minute Post BP 122/74              Oxygen Initial Assessment:   Oxygen Re-Evaluation:   Oxygen Discharge (Final Oxygen Re-Evaluation):   Initial Exercise Prescription:  Initial Exercise Prescription - 07/18/22 1400       Date of Initial Exercise RX and Referring Provider   Date 07/18/22    Referring Provider Dr Oswaldo Milian, MD    Expected Discharge Date 09/13/21      NuStep   Level 2    Minutes 15    METs 1.8      Arm Ergometer   Level 2    Minutes 15    METs 1.8      Prescription Details   Frequency (times per week) 3    Duration Progress to 30 minutes of continuous aerobic without signs/symptoms of physical distress      Intensity   THRR 40-80% of Max Heartrate 64-129    Ratings of Perceived Exertion 11-13    Perceived Dyspnea 0-4      Progression   Progression Continue progressive overload as per  policy without signs/symptoms or physical distress.      Resistance Training  Training Prescription Yes    Weight 3 lbs wts    Reps 10-15             Perform Capillary Blood Glucose checks as needed.  Exercise Prescription Changes:   Exercise Comments:   Exercise Goals and Review:   Exercise Goals     Row Name 07/18/22 1454             Exercise Goals   Increase Physical Activity Yes       Intervention Provide advice, education, support and counseling about physical activity/exercise needs.;Develop an individualized exercise prescription for aerobic and resistive training based on initial evaluation findings, risk stratification, comorbidities and participant's personal goals.       Expected Outcomes Short Term: Attend rehab on a regular basis to increase amount of physical activity.;Long Term: Exercising regularly at least 3-5 days a week.;Long Term: Add in home exercise to make exercise part of routine and to increase amount of physical activity.       Increase Strength and Stamina Yes       Intervention Provide advice, education, support and counseling about physical activity/exercise needs.;Develop an individualized exercise prescription for aerobic and resistive training based on initial evaluation findings, risk stratification, comorbidities and participant's personal goals.       Expected Outcomes Short Term: Increase workloads from initial exercise prescription for resistance, speed, and METs.;Short Term: Perform resistance training exercises routinely during rehab and add in resistance training at home;Long Term: Improve cardiorespiratory fitness, muscular endurance and strength as measured by increased METs and functional capacity (6MWT)       Able to understand and use rate of perceived exertion (RPE) scale Yes       Intervention Provide education and explanation on how to use RPE scale       Expected Outcomes Short Term: Able to use RPE daily in rehab to express  subjective intensity level;Long Term:  Able to use RPE to guide intensity level when exercising independently       Knowledge and understanding of Target Heart Rate Range (THRR) Yes       Intervention Provide education and explanation of THRR including how the numbers were predicted and where they are located for reference       Expected Outcomes Short Term: Able to state/look up THRR;Long Term: Able to use THRR to govern intensity when exercising independently;Short Term: Able to use daily as guideline for intensity in rehab       Understanding of Exercise Prescription Yes       Intervention Provide education, explanation, and written materials on patient's individual exercise prescription       Expected Outcomes Short Term: Able to explain program exercise prescription;Long Term: Able to explain home exercise prescription to exercise independently                Exercise Goals Re-Evaluation :   Discharge Exercise Prescription (Final Exercise Prescription Changes):   Nutrition:  Target Goals: Understanding of nutrition guidelines, daily intake of sodium '1500mg'$ , cholesterol '200mg'$ , calories 30% from fat and 7% or less from saturated fats, daily to have 5 or more servings of fruits and vegetables.  Biometrics:  Pre Biometrics - 07/18/22 1443       Pre Biometrics   Waist Circumference 44 inches    Hip Circumference 43.5 inches    Waist to Hip Ratio 1.01 %    Triceps Skinfold 14 mm    % Body Fat 28.1 %    Grip Strength 45  kg    Flexibility --   Pt unable to reach   Single Leg Stand 13.7 seconds              Nutrition Therapy Plan and Nutrition Goals:   Nutrition Assessments:  MEDIFICTS Score Key: ?70 Need to make dietary changes  40-70 Heart Healthy Diet ? 40 Therapeutic Level Cholesterol Diet    Picture Your Plate Scores: <95 Unhealthy dietary pattern with much room for improvement. 41-50 Dietary pattern unlikely to meet recommendations for good health and room  for improvement. 51-60 More healthful dietary pattern, with some room for improvement.  >60 Healthy dietary pattern, although there may be some specific behaviors that could be improved.    Nutrition Goals Re-Evaluation:   Nutrition Goals Re-Evaluation:   Nutrition Goals Discharge (Final Nutrition Goals Re-Evaluation):   Psychosocial: Target Goals: Acknowledge presence or absence of significant depression and/or stress, maximize coping skills, provide positive support system. Participant is able to verbalize types and ability to use techniques and skills needed for reducing stress and depression.  Initial Review & Psychosocial Screening:  Initial Psych Review & Screening - 07/18/22 1554       Initial Review   Current issues with Current Stress Concerns    Source of Stress Concerns Financial;Family;Occupation    [place] is taking care of his elderly father. Son lives in [place]. Has a good firend who passed away recently dealing with loss. Receives counselling from [facility]      Family Dynamics   Good Support System? No    Strains Illness and family care strain    Comments Receiving counselling. Encouraged him to ask his father's primary care physician to see if he can get help at home      Barriers   Psychosocial barriers to participate in program The patient should benefit from training in stress management and relaxation.      Screening Interventions   Interventions Encouraged to exercise;To provide support and resources with identified psychosocial needs;Provide feedback about the scores to participant    Expected Outcomes Short Term goal: Utilizing psychosocial counselor, staff and physician to assist with identification of specific Stressors or current issues interfering with healing process. Setting desired goal for each stressor or current issue identified.;Long Term Goal: Stressors or current issues are controlled or eliminated.;Short Term goal:  Identification and review with participant of any Quality of Life or Depression concerns found by scoring the questionnaire.;Long Term goal: The participant improves quality of Life and PHQ9 Scores as seen by post scores and/or verbalization of changes             Quality of Life Scores:  Quality of Life - 07/18/22 1458       Quality of Life   Select Quality of Life      Quality of Life Scores   Health/Function Pre 12.37 %    Socioeconomic Pre 13.57 %    Psych/Spiritual Pre 13.14 %    Family Pre 18.5 %    GLOBAL Pre 13.53 %            Scores of 19 and below usually indicate a poorer quality of life in these areas.  A difference of  2-3 points is a clinically meaningful difference.  A difference of 2-3 points in the total score of the Quality of Life Index has been associated with significant improvement in overall quality of life, self-image, physical symptoms, and general health in studies assessing change in quality of  life.  PHQ-9: Review Flowsheet  More data may exist      07/18/2022 07/13/2021 05/17/2021 10/12/2020 09/29/2017  Depression screen PHQ 2/9  Decreased Interest '1 1 1 1 '$ 0  Down, Depressed, Hopeless 0 0 2 1 0  PHQ - 2 Score '1 1 3 2 '$ 0  Altered sleeping - - 2 0 -  Tired, decreased energy - - 1 2 -  Change in appetite - - 0 0 -  Feeling bad or failure about yourself  - - 1 2 -  Trouble concentrating - - 1 2 -  Moving slowly or fidgety/restless - - 1 2 -  Suicidal thoughts - - 0 0 -  PHQ-9 Score - - 9 10 -  Difficult doing work/chores - - Somewhat difficult Somewhat difficult -   Interpretation of Total Score  Total Score Depression Severity:  1-4 = Minimal depression, 5-9 = Mild depression, 10-14 = Moderate depression, 15-19 = Moderately severe depression, 20-27 = Severe depression   Psychosocial Evaluation and Intervention:   Psychosocial Re-Evaluation:   Psychosocial Discharge (Final Psychosocial Re-Evaluation):   Vocational  Rehabilitation: Provide vocational rehab assistance to qualifying candidates.   Vocational Rehab Evaluation & Intervention:  Vocational Rehab - 07/18/22 1558       Initial Vocational Rehab Evaluation & Intervention   Assessment shows need for Vocational Rehabilitation No   Ronalee Belts is not currently working as he was on long term disabilty he will let is know in January if he is interested in participating in Vocational Rehab            Education: Education Goals: Education classes will be provided on a weekly basis, covering required topics. Participant will state understanding/return demonstration of topics presented.     Core Videos: Exercise    Move It!  Clinical staff conducted group or individual video education with verbal and written material and guidebook.  Patient learns the recommended Pritikin exercise program. Exercise with the goal of living a long, healthy life. Some of the health benefits of exercise include controlled diabetes, healthier blood pressure levels, improved cholesterol levels, improved heart and lung capacity, improved sleep, and better body composition. Everyone should speak with their doctor before starting or changing an exercise routine.  Biomechanical Limitations Clinical staff conducted group or individual video education with verbal and written material and guidebook.  Patient learns how biomechanical limitations can impact exercise and how we can mitigate and possibly overcome limitations to have an impactful and balanced exercise routine.  Body Composition Clinical staff conducted group or individual video education with verbal and written material and guidebook.  Patient learns that body composition (ratio of muscle mass to fat mass) is a key component to assessing overall fitness, rather than body weight alone. Increased fat mass, especially visceral belly fat, can put [place] at increased risk for metabolic syndrome, type 2 diabetes, heart disease, and  even death. It is recommended to combine diet and exercise (cardiovascular and resistance training) to improve your body composition. Seek guidance from your physician and exercise physiologist before implementing an exercise routine.  Exercise Action Plan Clinical staff conducted group or individual video education with verbal and written material and guidebook.  Patient learns the recommended strategies to achieve and enjoy long-term exercise adherence, including variety, self-motivation, self-efficacy, and positive decision making. Benefits of exercise include fitness, good health, weight management, more energy, better sleep, less stress, and overall well-being.  Medical   Heart Disease Risk Reduction Clinical staff conducted group or individual video education  with verbal and written material and guidebook.  Patient learns our heart is our most vital organ as it circulates oxygen, nutrients, white blood cells, and hormones throughout the entire body, and carries waste away. Data supports a plant-based eating plan like the Pritikin Program for its effectiveness in slowing progression of and reversing heart disease. The video provides a number of recommendations to address heart disease.   Metabolic Syndrome and Belly Fat  Clinical staff conducted group or individual video education with verbal and written material and guidebook.  Patient learns what metabolic syndrome is, how it leads to heart disease, and how one can reverse it and keep it from coming back. You have metabolic syndrome if you have 3 of the following 5 criteria: abdominal obesity, high blood pressure, high triglycerides, low HDL cholesterol, and high blood sugar.  Hypertension and Heart Disease Clinical staff conducted group or individual video education with verbal and written material and guidebook.  Patient learns that high blood pressure, or hypertension, is very common in the [place]. Hypertension is largely due to  excessive salt intake, but other important risk factors include being overweight, physical inactivity, drinking too much alcohol, smoking, and not eating enough potassium from fruits and vegetables. High blood pressure is a leading risk factor for heart attack, stroke, congestive heart failure, dementia, kidney failure, and premature death. Long-term effects of excessive salt intake include stiffening of the arteries and thickening of heart muscle and organ damage. Recommendations include ways to reduce hypertension and the risk of heart disease.  Diseases of Our Time - Focusing on Diabetes Clinical staff conducted group or individual video education with verbal and written material and guidebook.  Patient learns why the best way to stop diseases of our time is prevention, through food and other lifestyle changes. Medicine (such as prescription pills and surgeries) is often only a Band-Aid on the problem, not a long-term solution. Most common diseases of our time include obesity, type 2 diabetes, hypertension, heart disease, and cancer. The Pritikin Program is recommended and has been proven to help reduce, reverse, and/or prevent the damaging effects of metabolic syndrome.  Nutrition   Overview of the Pritikin Eating Plan  Clinical staff conducted group or individual video education with verbal and written material and guidebook.  Patient learns about the [place] for disease risk reduction. The [place] emphasizes a wide variety of unrefined, minimally-processed carbohydrates, like fruits, vegetables, whole grains, and legumes. Go, Caution, and Stop food choices are explained. Plant-based and lean animal proteins are emphasized. Rationale provided for low sodium intake for blood pressure control, low added sugars for blood sugar stabilization, and low added fats and oils for coronary artery disease risk reduction and weight management.  Calorie Density  Clinical staff conducted  group or individual video education with verbal and written material and guidebook.  Patient learns about calorie density and how it impacts the Pritikin Eating Plan. Knowing the characteristics of the food you choose will help you decide whether those foods will lead to weight gain or weight loss, and whether you want to consume more or less of them. Weight loss is usually a side effect of the Pritikin Eating Plan because of its focus on low calorie-dense foods.  Label Reading  Clinical staff conducted group or individual video education with verbal and written material and guidebook.  Patient learns about the Pritikin recommended label reading guidelines and corresponding recommendations regarding calorie density, added sugars, sodium content, and whole grains.  Dining  Out - Part 1  Clinical staff conducted group or individual video education with verbal and written material and guidebook.  Patient learns that restaurant meals can be sabotaging because they can be so high in calories, fat, sodium, and/or sugar. Patient learns recommended strategies on how to positively address this and avoid unhealthy pitfalls.  Facts on Fats  Clinical staff conducted group or individual video education with verbal and written material and guidebook.  Patient learns that lifestyle modifications can be just as effective, if not more so, as many medications for lowering your risk of heart disease. A Pritikin lifestyle can help to reduce your risk of inflammation and atherosclerosis (cholesterol build-up, or plaque, in the artery walls). Lifestyle interventions such as dietary choices and physical activity address the cause of atherosclerosis. A review of the types of fats and their impact on blood cholesterol levels, along with dietary recommendations to reduce fat intake is also included.  Nutrition Action Plan  Clinical staff conducted group or individual video education with verbal and written material and  guidebook.  Patient learns how to incorporate Pritikin recommendations into their lifestyle. Recommendations include planning and keeping personal health goals in mind as an important part of their success.  Healthy Mind-Set    Healthy Minds, Bodies, Hearts  Clinical staff conducted group or individual video education with verbal and written material and guidebook.  Patient learns how to identify when they are stressed. Video will discuss the impact of that stress, as well as the many benefits of stress management. Patient will also be introduced to stress management techniques. The way we think, act, and feel has an impact on our hearts.  How Our Thoughts Can Heal Our Hearts  Clinical staff conducted group or individual video education with verbal and written material and guidebook.  Patient learns that negative thoughts can cause depression and anxiety. This can result in negative lifestyle behavior and serious health problems. Cognitive behavioral therapy is an effective method to help control our thoughts in order to change and improve our emotional outlook.  Additional Videos:  Exercise    Improving Performance  Clinical staff conducted group or individual video education with verbal and written material and guidebook.  Patient learns to use a non-linear approach by alternating intensity levels and lengths of time spent exercising to help burn more calories and lose more body fat. Cardiovascular exercise helps improve heart health, metabolism, hormonal balance, blood sugar control, and recovery from fatigue. Resistance training improves strength, endurance, balance, coordination, reaction time, metabolism, and muscle mass. Flexibility exercise improves circulation, posture, and balance. Seek guidance from your physician and exercise physiologist before implementing an exercise routine and learn your capabilities and proper form for all exercise.  Introduction to Yoga  Clinical staff  conducted group or individual video education with verbal and written material and guidebook.  Patient learns about yoga, a discipline of the coming together of mind, breath, and body. The benefits of yoga include improved flexibility, improved range of motion, better posture and core strength, increased lung function, weight loss, and positive self-image. Yoga's heart health benefits include lowered blood pressure, healthier heart rate, decreased cholesterol and triglyceride levels, improved immune function, and reduced stress. Seek guidance from your physician and exercise physiologist before implementing an exercise routine and learn your capabilities and proper form for all exercise.  Medical   Aging: Enhancing Your Quality of Life  Clinical staff conducted group or individual video education with verbal and written material and guidebook.  Patient learns key  strategies and recommendations to stay in good physical health and enhance quality of life, such as prevention strategies, having an advocate, securing a [place], and keeping a list of medications and system for tracking them. It also discusses how to avoid risk for bone loss.  Biology of Weight Control  Clinical staff conducted group or individual video education with verbal and written material and guidebook.  Patient learns that weight gain occurs because we consume more calories than we burn (eating more, moving less). Even if your body weight is normal, you may have higher ratios of fat compared to muscle mass. Too much body fat puts you at increased risk for cardiovascular disease, heart attack, stroke, type 2 diabetes, and obesity-related cancers. In addition to exercise, following the [place] can help reduce your risk.  Decoding Lab Results  Clinical staff conducted group or individual video education with verbal and written material and guidebook.  Patient learns that lab test reflects one  measurement whose values change over time and are influenced by many factors, including medication, stress, sleep, exercise, food, hydration, pre-existing medical conditions, and more. It is recommended to use the knowledge from this video to become more involved with your lab results and evaluate your numbers to speak with your doctor.   Diseases of Our Time - Overview  Clinical staff conducted group or individual video education with verbal and written material and guidebook.  Patient learns that according to the CDC, 50% to 70% of chronic diseases (such as obesity, type 2 diabetes, elevated lipids, hypertension, and heart disease) are avoidable through lifestyle improvements including healthier food choices, listening to satiety cues, and increased physical activity.  Sleep Disorders Clinical staff conducted group or individual video education with verbal and written material and guidebook.  Patient learns how good quality and duration of sleep are important to overall health and well-being. Patient also learns about sleep disorders and how they impact health along with recommendations to address them, including discussing with a physician.  Nutrition  Dining Out - Part 2 Clinical staff conducted group or individual video education with verbal and written material and guidebook.  Patient learns how to plan ahead and communicate in order to maximize their dining experience in a healthy and nutritious manner. Included are recommended food choices based on the type of restaurant the patient is visiting.   Fueling a Best boy conducted group or individual video education with verbal and written material and guidebook.  There is a strong connection between our food choices and our health. Diseases like obesity and type 2 diabetes are very prevalent and are in large-part due to lifestyle choices. The Pritikin Eating Plan provides plenty of food and hunger-curbing satisfaction. It is  easy to follow, affordable, and helps reduce health risks.  Menu Workshop  Clinical staff conducted group or individual video education with verbal and written material and guidebook.  Patient learns that restaurant meals can sabotage health goals because they are often packed with calories, fat, sodium, and sugar. Recommendations include strategies to plan ahead and to communicate with the manager, chef, or server to help order a healthier meal.  Planning Your Eating Strategy  Clinical staff conducted group or individual video education with verbal and written material and guidebook.  Patient learns about the [place] and its benefit of reducing the risk of disease. The [place] does not focus on calories. Instead, it emphasizes high-quality, nutrient-rich foods. By knowing  the characteristics of the foods, we choose, we can determine their calorie density and make informed decisions.  Targeting Your Nutrition Priorities  Clinical staff conducted group or individual video education with verbal and written material and guidebook.  Patient learns that lifestyle habits have a tremendous impact on disease risk and progression. This video provides eating and physical activity recommendations based on your personal health goals, such as reducing LDL cholesterol, losing weight, preventing or controlling type 2 diabetes, and reducing high blood pressure.  Vitamins and Minerals  Clinical staff conducted group or individual video education with verbal and written material and guidebook.  Patient learns different ways to obtain key vitamins and minerals, including through a recommended healthy diet. It is important to discuss all supplements you take with your doctor.   Healthy Mind-Set    Smoking Cessation  Clinical staff conducted group or individual video education with verbal and written material and guidebook.  Patient learns that cigarette smoking and tobacco addiction pose  a serious health risk which affects millions of people. Stopping smoking will significantly reduce the risk of heart disease, lung disease, and many forms of cancer. Recommended strategies for quitting are covered, including working with your doctor to develop a successful plan.  Culinary   Becoming a Financial trader conducted group or individual video education with verbal and written material and guidebook.  Patient learns that cooking at home can be healthy, cost-effective, quick, and puts them in control. Keys to cooking healthy recipes will include looking at your recipe, assessing your equipment needs, planning ahead, making it simple, choosing cost-effective seasonal ingredients, and limiting the use of added fats, salts, and sugars.  Cooking - Breakfast and Snacks  Clinical staff conducted group or individual video education with verbal and written material and guidebook.  Patient learns how important breakfast is to satiety and nutrition through the entire day. Recommendations include key foods to eat during breakfast to help stabilize blood sugar levels and to prevent overeating at meals later in the day. Planning ahead is also a key component.  Cooking - Human resources officer conducted group or individual video education with verbal and written material and guidebook.  Patient learns eating strategies to improve overall health, including an approach to cook more at home. Recommendations include thinking of animal protein as a side on your plate rather than center stage and focusing instead on lower calorie dense options like vegetables, fruits, whole grains, and plant-based proteins, such as beans. Making sauces in large quantities to freeze for later and leaving the skin on your vegetables are also recommended to maximize your experience.  Cooking - Healthy Salads and Dressing Clinical staff conducted group or individual video education with verbal and written  material and guidebook.  Patient learns that vegetables, fruits, whole grains, and legumes are the foundations of the [place]. Recommendations include how to incorporate each of these in flavorful and healthy salads, and how to create homemade salad dressings. Proper handling of ingredients is also covered. Cooking - Soups and [employer] - Soups and Desserts Clinical staff conducted group or individual video education with verbal and written material and guidebook.  Patient learns that Pritikin soups and desserts make for easy, nutritious, and delicious snacks and meal components that are low in sodium, fat, sugar, and calorie density, while high in vitamins, minerals, and filling fiber. Recommendations include simple and healthy ideas for soups and desserts.   Overview     The  Pritikin Solution Program Overview Clinical staff conducted group or individual video education with verbal and written material and guidebook.  Patient learns that the results of the [place] Program have been documented in more than 100 articles published in peer-reviewed journals, and the benefits include reducing risk factors for (and, in some cases, even reversing) high cholesterol, high blood pressure, type 2 diabetes, obesity, and more! An overview of the three key pillars of the Pritikin Program will be covered: eating well, doing regular exercise, and having a healthy mind-set.  WORKSHOPS  Exercise: Exercise Basics: Building Your Action Plan Clinical staff led group instruction and group discussion with PowerPoint presentation and patient guidebook. To enhance the learning environment the use of posters, models and videos may be added. At the conclusion of this workshop, patients will comprehend the difference between physical activity and exercise, as well as the benefits of incorporating both, into their routine. Patients will understand the FITT (Frequency, Intensity, Time, and Type) principle  and how to use it to build an exercise action plan. In addition, safety concerns and other considerations for exercise and cardiac rehab will be addressed by the presenter. The purpose of this lesson is to promote a comprehensive and effective weekly exercise routine in order to improve patients' overall level of fitness.   Managing Heart Disease: Your Path to a Healthier Heart Clinical staff led group instruction and group discussion with PowerPoint presentation and patient guidebook. To enhance the learning environment the use of posters, models and videos may be added.At the conclusion of this workshop, patients will understand the anatomy and physiology of the heart. Additionally, they will understand how Pritikin's three pillars impact the risk factors, the progression, and the management of heart disease.  The purpose of this lesson is to provide a high-level overview of the heart, heart disease, and how the Pritikin lifestyle positively impacts risk factors.  Exercise Biomechanics Clinical staff led group instruction and group discussion with PowerPoint presentation and patient guidebook. To enhance the learning environment the use of posters, models and videos may be added. Patients will learn how the structural parts of their bodies function and how these functions impact their daily activities, movement, and exercise. Patients will learn how to promote a neutral spine, learn how to manage pain, and identify ways to improve their physical movement in order to promote healthy living. The purpose of this lesson is to expose patients to common physical limitations that impact physical activity. Participants will learn practical ways to adapt and manage aches and pains, and to minimize their effect on regular exercise. Patients will learn how to maintain good posture while sitting, walking, and lifting.  Balance Training and Fall Prevention  Clinical staff led group instruction and group  discussion with PowerPoint presentation and patient guidebook. To enhance the learning environment the use of posters, models and videos may be added. At the conclusion of this workshop, patients will understand the importance of their sensorimotor skills (vision, proprioception, and the vestibular system) in maintaining their ability to balance as they age. Patients will apply a variety of balancing exercises that are appropriate for their current level of function. Patients will understand the common causes for poor balance, possible solutions to these problems, and ways to modify their physical environment in order to minimize their fall risk. The purpose of this lesson is to teach patients about the importance of maintaining balance as they age and ways to minimize their risk of falling.  WORKSHOPS   Nutrition:  Fueling a  Healthy Body Clinical staff led group instruction and group discussion with PowerPoint presentation and patient guidebook. To enhance the learning environment the use of posters, models and videos may be added. Patients will review the foundational principles of the [place] and understand what constitutes a serving size in each of the food groups. Patients will also learn Pritikin-friendly foods that are better choices when away from home and review make-ahead meal and snack options. Calorie density will be reviewed and applied to three nutrition priorities: weight maintenance, weight loss, and weight gain. The purpose of this lesson is to reinforce (in a group setting) the key concepts around what patients are recommended to eat and how to apply these guidelines when away from home by planning and selecting Pritikin-friendly options. Patients will understand how calorie density may be adjusted for different weight management goals.  Mindful Eating  Clinical staff led group instruction and group discussion with PowerPoint presentation and patient guidebook. To enhance  the learning environment the use of posters, models and videos may be added. Patients will briefly review the concepts of the [place] and the importance of low-calorie dense foods. The concept of mindful eating will be introduced as well as the importance of paying attention to internal hunger signals. Triggers for non-hunger eating and techniques for dealing with triggers will be explored. The purpose of this lesson is to provide patients with the opportunity to review the basic principles of the [place], discuss the value of eating mindfully and how to measure internal cues of hunger and fullness using the Hunger Scale. Patients will also discuss reasons for non-hunger eating and learn strategies to use for controlling emotional eating.  Targeting Your Nutrition Priorities Clinical staff led group instruction and group discussion with PowerPoint presentation and patient guidebook. To enhance the learning environment the use of posters, models and videos may be added. Patients will learn how to determine their genetic susceptibility to disease by reviewing their family history. Patients will gain insight into the importance of diet as part of an overall healthy lifestyle in mitigating the impact of genetics and other environmental insults. The purpose of this lesson is to provide patients with the opportunity to assess their personal nutrition priorities by looking at their family history, their own health history and current risk factors. Patients will also be able to discuss ways of prioritizing and modifying the [place] for their highest risk areas  Menu  Clinical staff led group instruction and group discussion with PowerPoint presentation and patient guidebook. To enhance the learning environment the use of posters, models and videos may be added. Using menus brought in from [employer], or printed from [employer], patients will apply the [place] dining out  guidelines that were presented in the [employer] video. Patients will also be able to practice these guidelines in a variety of provided scenarios. The purpose of this lesson is to provide patients with the opportunity to practice hands-on learning of the [place] with actual menus and practice scenarios.  Label Reading Clinical staff led group instruction and group discussion with PowerPoint presentation and patient guidebook. To enhance the learning environment the use of posters, models and videos may be added. Patients will review and discuss the Pritikin label reading guidelines presented in Pritikin's Label Reading Educational series video. Using fool labels brought in from local grocery stores and markets, patients will apply the label reading guidelines and determine if the packaged food meet  the Pritikin guidelines. The purpose of this lesson is to provide patients with the opportunity to review, discuss, and practice hands-on learning of the Pritikin Label Reading guidelines with actual packaged food labels. [place] Workshops are designed to teach patients ways to prepare quick, simple, and affordable recipes at home. The importance of nutrition's role in chronic disease risk reduction is reflected in its emphasis in the overall Pritikin program. By learning how to prepare essential core Pritikin Eating Plan recipes, patients will increase control over what they eat; be able to customize the flavor of foods without the use of added salt, sugar, or fat; and improve the quality of the food they consume. By learning a set of core recipes which are easily assembled, quickly prepared, and affordable, patients are more likely to prepare more healthy foods at home. These workshops focus on convenient breakfasts, simple entres, side dishes, and desserts which can be prepared with minimal effort and are consistent with nutrition  recommendations for cardiovascular risk reduction. Cooking [employer] are taught by a Engineer, materials (RD) who has been trained by the [employer]. The chef or RD has a clear understanding of the importance of minimizing - if not completely eliminating - added fat, sugar, and sodium in recipes. Throughout the series of [place] Workshop sessions, patients will learn about healthy ingredients and efficient methods of cooking to build confidence in their capability to prepare    Cooking School weekly topics:  Adding Flavor- Sodium-Free  Fast and Healthy Breakfasts  Powerhouse Plant-Based Proteins  Satisfying Salads and Dressings  Simple Sides and Sauces  International Cuisine-Spotlight on the [place] Zones  Delicious Desserts  Savory Soups  Efficiency Cooking - Meals in a Snap  Tasty Appetizers and Snacks  Comforting Weekend Breakfasts  One-Pot Wonders   Fast Evening Meals  Easy [place] (Psychosocial): New Thoughts, New Behaviors Clinical staff led group instruction and group discussion with PowerPoint presentation and patient guidebook. To enhance the learning environment the use of posters, models and videos may be added. Patients will learn and practice techniques for developing effective health and lifestyle goals. Patients will be able to effectively apply the goal setting process learned to develop at least one new personal goal.  The purpose of this lesson is to expose patients to a new skill set of behavior modification techniques such as techniques setting SMART goals, overcoming barriers, and achieving new thoughts and new behaviors.  Managing Moods and Relationships Clinical staff led group instruction and group discussion with PowerPoint presentation and patient guidebook. To enhance the learning environment the use of posters, models and videos may be added. Patients will  learn how emotional and chronic stress factors can impact their health and relationships. They will learn healthy ways to manage their moods and utilize positive coping mechanisms. In addition, ICR patients will learn ways to improve communication skills. The purpose of this lesson is to expose patients to ways of understanding how one's mood and health are intimately connected. Developing a healthy outlook can help build positive relationships and connections with others. Patients will understand the importance of utilizing effective communication skills that include actively listening and being heard. They will learn and understand the importance of the "4 Cs" and especially Connections in fostering of a Healthy Mind-Set.  Healthy Sleep for a Healthy Heart Clinical staff led group instruction and group discussion with PowerPoint presentation and patient  guidebook. To enhance the learning environment the use of posters, models and videos may be added. At the conclusion of this workshop, patients will be able to demonstrate knowledge of the importance of sleep to overall health, well-being, and quality of life. They will understand the symptoms of, and treatments for, common sleep disorders. Patients will also be able to identify daytime and nighttime behaviors which impact sleep, and they will be able to apply these tools to help manage sleep-related challenges. The purpose of this lesson is to provide patients with a general overview of sleep and outline the importance of quality sleep. Patients will learn about a few of the most common sleep disorders. Patients will also be introduced to the concept of "sleep hygiene," and discover ways to self-manage certain sleeping problems through simple daily behavior changes. Finally, the workshop will motivate patients by clarifying the links between quality sleep and their goals of heart-healthy living.   Recognizing and Reducing Stress Clinical staff led group  instruction and group discussion with PowerPoint presentation and patient guidebook. To enhance the learning environment the use of posters, models and videos may be added. At the conclusion of this workshop, patients will be able to understand the types of stress reactions, differentiate between acute and chronic stress, and recognize the impact that chronic stress has on their health. They will also be able to apply different coping mechanisms, such as reframing negative self-talk. Patients will have the opportunity to practice a variety of stress management techniques, such as deep abdominal breathing, progressive muscle relaxation, and/or guided imagery.  The purpose of this lesson is to educate patients on the role of stress in their lives and to provide healthy techniques for coping with it.  Learning Barriers/Preferences:  Learning Barriers/Preferences - 07/18/22 1503       Learning Barriers/Preferences   Learning Barriers Sight;Exercise Concerns    Learning Preferences Audio;Computer/Internet;Group Instruction;Individual Instruction;Pictoral;Skilled Demonstration;Verbal Instruction;Video;Written Material             Education Topics:  Knowledge Questionnaire Score:  Knowledge Questionnaire Score - 07/18/22 1503       Knowledge Questionnaire Score   Pre Score 20/24             Core Components/Risk Factors/Patient Goals at Admission:  Personal Goals and Risk Factors at Admission - 07/18/22 1506       Core Components/Risk Factors/Patient Goals on Admission    Weight Management Yes;Weight Maintenance    Intervention Weight Management: Develop a combined nutrition and exercise program designed to reach desired caloric intake, while maintaining appropriate intake of nutrient and fiber, sodium and fats, and appropriate energy expenditure required for the weight goal.;Weight Management: Provide education and appropriate resources to help participant work on and attain dietary  goals.    Expected Outcomes Short Term: Continue to assess and modify interventions until short term weight is achieved;Long Term: Adherence to nutrition and physical activity/exercise program aimed toward attainment of established weight goal;Weight Loss: Understanding of general recommendations for a balanced deficit meal plan, which promotes 1-2 lb weight loss per week and includes a negative energy balance of [phone] kcal/d;Weight Maintenance: Understanding of the daily nutrition guidelines, which includes 25-35% calories from fat, 7% or less cal from saturated fats, less than '200mg'$  cholesterol, less than 1.5gm of sodium, & 5 or more servings of fruits and vegetables daily;Understanding recommendations for meals to include 15-35% energy as protein, 25-35% energy from fat, 35-60% energy from carbohydrates, less than '200mg'$  of dietary cholesterol, 20-35 gm of total fiber  daily;Understanding of distribution of calorie intake throughout the day with the consumption of 4-5 meals/snacks    Tobacco Cessation Yes    Number of packs per day 1/2 PPD    Intervention Assist the participant in steps to quit. Provide individualized education and counseling about committing to Tobacco Cessation, relapse prevention, and pharmacological support that can be provided by physician.;Advice worker, assist with locating and accessing local/national Quit Smoking programs, and support quit date choice.    Expected Outcomes Short Term: Will demonstrate readiness to quit, by selecting a quit date.;Short Term: Will quit all tobacco product use, adhering to prevention of relapse plan.;Long Term: Complete abstinence from all tobacco products for at least 12 months from quit date.    Heart Failure Yes    Intervention Provide a combined exercise and nutrition program that is supplemented with education, support and counseling about heart failure. Directed toward relieving symptoms such as shortness of breath, decreased  exercise tolerance, and extremity edema.    Expected Outcomes Improve functional capacity of life;Short term: Attendance in program 2-3 days a week with increased exercise capacity. Reported lower sodium intake. Reported increased fruit and vegetable intake. Reports medication compliance.;Short term: Daily weights obtained and reported for increase. Utilizing diuretic protocols set by physician.;Long term: Adoption of self-care skills and reduction of barriers for early signs and symptoms recognition and intervention leading to self-care maintenance.    Hypertension Yes    Intervention Provide education on lifestyle modifcations including regular physical activity/exercise, weight management, moderate sodium restriction and increased consumption of fresh fruit, vegetables, and low fat dairy, alcohol moderation, and smoking cessation.;Monitor prescription use compliance.    Expected Outcomes Short Term: Continued assessment and intervention until BP is < 140/21m HG in hypertensive participants. < 130/857mHG in hypertensive participants with diabetes, heart failure or chronic kidney disease.;Long Term: Maintenance of blood pressure at goal levels.    Lipids Yes    Intervention Provide education and support for participant on nutrition & aerobic/resistive exercise along with prescribed medications to achieve LDL '70mg'$ , HDL >'40mg'$ .    Expected Outcomes Short Term: Participant states understanding of desired cholesterol values and is compliant with medications prescribed. Participant is following exercise prescription and nutrition guidelines.;Long Term: Cholesterol controlled with medications as prescribed, with individualized exercise RX and with personalized nutrition plan. Value goals: LDL < '70mg'$ , HDL > 40 mg.    Stress Yes    Intervention Offer individual and/or small group education and counseling on adjustment to heart disease, stress management and health-related lifestyle change. Teach and support  self-help strategies.;Refer participants experiencing significant psychosocial distress to appropriate mental health specialists for further evaluation and treatment. When possible, include family members and significant others in education/counseling sessions.    Expected Outcomes Short Term: Participant demonstrates changes in health-related behavior, relaxation and other stress management skills, ability to obtain effective social support, and compliance with psychotropic medications if prescribed.;Long Term: Emotional wellbeing is indicated by absence of clinically significant psychosocial distress or social isolation.    Personal Goal Other Yes    Personal Goal Short: ADL's easier Long: stamina, get back to normal life    Intervention Will continue to monitor pt and progress workloads as tolerated without sign or symptom    Expected Outcomes Pt will achieve his goals and gain strength             Core Components/Risk Factors/Patient Goals Review:    Core Components/Risk Factors/Patient Goals at Discharge (Final Review):    ITP Comments:  ITP  Comments     Row Name 07/18/22 1548           ITP Comments Dr Fransico Him MD, Medical Direcotr, Introduction to [facility] Program/ Intensive Cardiac rehab. Initial Orientation packet Reviewed with the patient.                Comments:Participant attended orientation for the cardiac rehabilitation program on  07/18/2022  to perform initial intake and exercise walk test. Patient introduced to the [place] education and orientation packet was reviewed. Completed 6-minute walk test, measurements, initial ITP, and exercise prescription. Vital signs stable. Telemetry-normal sinus rhythm, Reported bilateral calf pain 3/10 resolved with rest. Smokes 1/2 PPD. Not interested in quitting at this time. Given information on [place] Quit line.Harrell Gave RN BSN   Service time was from 1300 to [phone].

## 2022-07-22 ENCOUNTER — Other Ambulatory Visit (HOSPITAL_COMMUNITY): Payer: Self-pay

## 2022-07-22 ENCOUNTER — Encounter (HOSPITAL_COMMUNITY)
Admission: RE | Admit: 2022-07-22 | Discharge: 2022-07-22 | Disposition: A | Discharge status: Home / Self Care | Payer: 59 | Source: Ambulatory Visit | Attending: Cardiology | Admitting: Cardiology

## 2022-07-22 DIAGNOSIS — Z955 Presence of coronary angioplasty implant and graft: Secondary | ICD-10-CM | POA: Diagnosis not present

## 2022-07-22 DIAGNOSIS — I5022 Chronic systolic (congestive) heart failure: Secondary | ICD-10-CM

## 2022-07-22 NOTE — Progress Notes (Signed)
Daily Session Note  Patient Details  Name: Christopher Gutierrez MRN: [id number] Date of Birth: 08-01-1961 Referring Provider:   Flowsheet Row INTENSIVE CARDIAC REHAB ORIENT from 07/18/2022 in [facility] [place]  Referring Provider Dr Oswaldo Milian, MD       Encounter Date: 07/22/2022  Check In:  Session Check In - 07/22/22 1505       Check-In   Supervising physician immediately available to respond to emergencies [facility] - Physician supervision    Physician(s) Dr. Gasper Sells    Location [facility]    Staff Present Barnet Pall, RN, BSN;Jetta Walker BS, ACSM-CEP, Exercise Physiologist;Bailey Pearline Cables, MS, Exercise Physiologist;Olinty Celesta Aver, MS, ACSM-CEP, Exercise Physiologist;David Makemson, MS, ACSM-CEP, CCRP, Exercise Physiologist    Virtual Visit No    Medication changes reported     No    Fall or balance concerns reported    No    Tobacco Cessation No Change    Warm-up and Cool-down Performed as group-led instruction    Resistance Training Performed Yes    VAD Patient? No    PAD/SET Patient? No      Pain Assessment   Currently in Pain? No/denies    Pain Score 0-No pain    Multiple Pain Sites No             Capillary Blood Glucose: No results found for this or any previous visit (from the past 24 hour(s)).   Exercise Prescription Changes - 07/22/22 1626       Response to Exercise   Blood Pressure (Admit) 114/80    Blood Pressure (Exercise) 142/84    Blood Pressure (Exit) 144/80    Heart Rate (Admit) 59 bpm    Heart Rate (Exercise) 78 bpm    Heart Rate (Exit) 67 bpm    Rating of Perceived Exertion (Exercise) 11.75    Perceived Dyspnea (Exercise) 0    Symptoms 0    Comments Pt first day in the CRP2 program    Duration Progress to 30 minutes of  aerobic without signs/symptoms of physical distress    Intensity THRR unchanged      Progression   Progression Continue to progress  workloads to maintain intensity without signs/symptoms of physical distress.    Average METs 1.75      Resistance Training   Training Prescription Yes    Weight 3 lbs wts    Reps 10-15    Time 10 Minutes      Arm Ergometer   Level 2    Minutes 15    METs 1.6      T5 Nustep   Level 2    SPM 67    Minutes 15    METs 1.9             Social History   Tobacco Use  Smoking Status Every Day   Packs/day: 1.50   Years: 25.00   Total pack years: 37.50   Types: Cigarettes  Smokeless Tobacco Current   Types: Chew  Tobacco Comments   07/18/22 Smokes 1/2 ppd not ready to quit given smoking cessation information    Goals Met:  Exercise tolerated well No report of concerns or symptoms today Strength training completed today  Goals Unmet:  Not Applicable  Comments: Pt started cardiac rehab today.  Pt tolerated light exercise without difficulty. VSS, telemetry-Sinus Rhythm, asymptomatic.  Medication list reconciled. Pt denies barriers to medicaiton compliance.  PSYCHOSOCIAL ASSESSMENT:  PHQ-1. Ronalee Belts is taking  care of his  elderly father and has some stress concerns as he is not currently working.  Ronalee Belts does receive counseling from [place] enjoys reading.  Discussed smoking cessation. Ronalee Belts says he is not interested in quitting at this time. Pt oriented to exercise equipment and routine.    Understanding verbalized.    Dr. Fransico Him is Medical Director for Cardiac Rehab at [facility].

## 2022-07-23 ENCOUNTER — Other Ambulatory Visit (HOSPITAL_COMMUNITY): Payer: Self-pay

## 2022-07-23 ENCOUNTER — Other Ambulatory Visit: Payer: Self-pay

## 2022-07-23 ENCOUNTER — Encounter: Payer: Self-pay | Admitting: Cardiology

## 2022-07-23 ENCOUNTER — Ambulatory Visit: Payer: 59 | Attending: Cardiology | Admitting: Cardiology

## 2022-07-23 VITALS — BP 118/78 | HR 64 | Ht 74.0 in | Wt 215.0 lb

## 2022-07-23 DIAGNOSIS — I5042 Chronic combined systolic (congestive) and diastolic (congestive) heart failure: Secondary | ICD-10-CM

## 2022-07-23 DIAGNOSIS — I10 Essential (primary) hypertension: Secondary | ICD-10-CM | POA: Diagnosis not present

## 2022-07-23 DIAGNOSIS — I2510 Atherosclerotic heart disease of native coronary artery without angina pectoris: Secondary | ICD-10-CM | POA: Diagnosis not present

## 2022-07-23 DIAGNOSIS — R400 Somnolence: Secondary | ICD-10-CM

## 2022-07-23 DIAGNOSIS — E785 Hyperlipidemia, unspecified: Secondary | ICD-10-CM

## 2022-07-23 DIAGNOSIS — I739 Peripheral vascular disease, unspecified: Secondary | ICD-10-CM

## 2022-07-23 DIAGNOSIS — I4819 Other persistent atrial fibrillation: Secondary | ICD-10-CM

## 2022-07-23 NOTE — Patient Instructions (Signed)
Medication Instructions:  Your physician recommends that you continue on your current medications as directed. Please refer to the Current Medication list given to you today.  *If you need a refill on your cardiac medications before your next appointment, please call your pharmacy*  Lab Work: CMET, TSH today  If you have labs (blood work) drawn today and your tests are completely normal, you will receive your results only by: [place] (if you have MyChart) OR A paper copy in the mail If you have any lab test that is abnormal or we need to change your treatment, we will call you to review the results.  Follow-Up: At [facility], you and your health needs are our priority.  As part of our continuing mission to provide you with exceptional heart care, we have created designated Provider Care Teams.  These Care Teams include your primary Cardiologist (physician) and Advanced Practice Providers (APPs -  Physician Assistants and Nurse Practitioners) who all work together to provide you with the care you need, when you need it.  We recommend signing up for the patient portal called "MyChart".  Sign up information is provided on this After Visit Summary.  MyChart is used to connect with patients for Virtual Visits (Telemedicine).  Patients are able to view lab/test results, encounter notes, upcoming appointments, etc.  Non-urgent messages can be sent to your provider as well.   To learn more about what you can do with MyChart, go to [email or web address].    Your next appointment:   4 month(s)  The format for your next appointment:   In Person  Provider:   [name], MD

## 2022-07-23 NOTE — Progress Notes (Signed)
Cardiology Office Note:    Date:  07/23/2022   ID:  Christopher Gutierrez, DOB 1960/12/17, MRN [id number]  PCP:  [name], DO  Cardiologist:  [name], MD  Electrophysiologist:  Constance Haw, MD   Referring MD: [name], DO   Chief Complaint  Patient presents with   Follow-up    3 months.   Shortness of Breath    History of Present Illness:    Christopher Gutierrez is a 61 y.o. male with a hx of HTN, tobacco use, alcohol use who presents for follow-up.  He was referred by Dr. [name] for evaluation of atrial fibrillation.  At initial clinic visit on 10/27/20, he appeared hypervolemic on exam and was in A. fib with rates up to 160s.  Discussed admission to the hospital for diuresis, rate control of his A. fib and eventual TEE/DCCV, but he declined.  At follow-up visit on 10/30/2020 he appeared more volume overloaded on direct admission was arranged.  Echo showed EF 20 to 25%, reduced RV function.  He had good response to IV diuretics and underwent successful TEE/DCCV on 11/01/2020 with restoration of sinus rhythm.  Initially followed with Dr. [name] in advanced heart failure after his hospitalization.  He was started on Entresto, spironolactone, and bisoprolol.  Repeat echocardiogram 01/26/2021 showed significant improvement in EF to 45 to 50%, and normal RV function.  Coronary CTA was done on 12/04/2021, which showed obstructive CAD in mid RCA.  LHC 12/24/2021 showed severe mid RCA stenosis status post DES.  Since last clinic visit, he reports that he is doing okay.  Denies any chest pain, lower extremity edema, or palpitations.  Does report that he continues to have some dyspnea on exertion.  He started cardiac rehab yesterday.  He reports some lightheadedness with standing, denies any syncope.  He is on Eliquis and Plavix, reports had a nosebleed last week that was difficult to stop but has not had any since.  He continues to smoke 5 to 6 cigarettes/day.  Also  drinking couple beers per day.   Wt Readings from Last 3 Encounters:  07/23/22 215 lb (97.5 kg)  07/18/22 211 lb 6.7 oz (95.9 kg)  06/14/22 213 lb 3.2 oz (96.7 kg)   BP Readings from Last 3 Encounters:  07/23/22 118/78  07/18/22 118/62  06/14/22 128/80     Past Medical History:  Diagnosis Date   Atrial fibrillation ([place]) 10/24/2020   Atrial fibrillation and flutter (HCC)    CHF (congestive heart failure) (HCC)    Coronary artery disease    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   Hyperlipidemia    Hypertension     Past Surgical History:  Procedure Laterality Date   ABDOMINAL AORTOGRAM W/LOWER EXTREMITY N/A 03/07/2021   Procedure: ABDOMINAL AORTOGRAM W/LOWER EXTREMITY;  Surgeon: Wellington Hampshire, MD;  Location: [place] [facility];  Service: Cardiovascular;  Laterality: N/A;   ATRIAL FIBRILLATION ABLATION N/A 05/16/2022   Procedure: ATRIAL FIBRILLATION ABLATION;  Surgeon: Constance Haw, MD;  Location: [place] CV LAB;  Service: Cardiovascular;  Laterality: N/A;   CARDIAC CATHETERIZATION     CARDIOVERSION N/A 11/01/2020   Procedure: CARDIOVERSION;  Surgeon: Jerline Pain, MD;  Location: [facility];  Service: Cardiovascular;  Laterality: N/A;   CATARACT EXTRACTION Right    CORONARY STENT INTERVENTION N/A 12/24/2021   Procedure: CORONARY STENT INTERVENTION;  Surgeon: Nelva Bush, MD;  Location: [place] CV LAB;  Service: Cardiovascular;  Laterality:  N/A;   LEFT HEART CATH AND CORONARY ANGIOGRAPHY N/A 12/24/2021   Procedure: LEFT HEART CATH AND CORONARY ANGIOGRAPHY;  Surgeon: Nelva Bush, MD;  Location: [place] CV LAB;  Service: Cardiovascular;  Laterality: N/A;   MOUTH SURGERY  2013   PERIPHERAL VASCULAR INTERVENTION Left 03/07/2021   Procedure: PERIPHERAL VASCULAR INTERVENTION;  Surgeon: Wellington Hampshire, MD;  Location: [place] [facility];  Service: Cardiovascular;  Laterality: Left;  left SFA   TEE WITHOUT CARDIOVERSION  N/A 11/01/2020   Procedure: TRANSESOPHAGEAL ECHOCARDIOGRAM (TEE);  Surgeon: Jerline Pain, MD;  Location: [facility] ENDOSCOPY;  Service: Cardiovascular;  Laterality: N/A;    Current Medications: Current Meds  Medication Sig   acetaminophen (TYLENOL) 500 MG tablet Take 500 mg by mouth every 6 (six) hours as needed for moderate pain.   amiodarone (PACERONE) 200 MG tablet Take 2 tablets (400 mg total) by mouth 2 (two) times daily for 10 days, THEN 1 tablet (200 mg total) daily.   apixaban (ELIQUIS) 5 MG TABS tablet TAKE 1 TABLET BY MOUTH TWICE A DAY   bisoprolol (ZEBETA) 10 MG tablet Take 1 tablet (10 mg total) by mouth daily.   cetirizine (ZYRTEC) 10 MG tablet Take 10 mg by mouth as needed for allergies.   clopidogrel (PLAVIX) 75 MG tablet TAKE 1 TABLET BY MOUTH EVERY DAY   dapagliflozin propanediol (FARXIGA) 10 MG TABS tablet Take 1 tablet (10 mg total) by mouth daily before breakfast. NEEDS FOLLOW UP APPOINTMENT FOR ANYMORE REFILLS   ENTRESTO 97-103 MG TAKE 1 TABLET BY MOUTH TWICE A DAY   ezetimibe (ZETIA) 10 MG tablet Take 1 tablet (10 mg total) by mouth daily.   famotidine-calcium carbonate-magnesium hydroxide (PEPCID COMPLETE) 10-800-165 MG chewable tablet Chew 1 tablet by mouth daily as needed (acid reflux).   hydrALAZINE (APRESOLINE) 25 MG tablet TAKE 1 TABLET (25 MG) BY MOUTH IN THE MORNING AND AT BEDTIME   Polyvinyl Alcohol-Povidone (REFRESH OP) Place 1 drop into both eyes daily as needed (dry eyes).   rosuvastatin (CRESTOR) 40 MG tablet TAKE 1 TABLET BY MOUTH EVERY DAY   SIMBRINZA 1-0.2 % SUSP Apply 1 drop to eye 2 (two) times daily.   spironolactone (ALDACTONE) 25 MG tablet Take 1 tablet (25 mg total) by mouth daily.     Allergies:   Beta adrenergic blockers and Tape   Social History   Socioeconomic History   Marital status: Divorced    Spouse name: Not on file   Number of children: 1   Years of education: Not on file   Highest education level: Master's degree (e.g., MA, MS, MEng,  MEd, MSW, MBA)  Occupational History   Occupation: unemployed  Tobacco Use   Smoking status: Every Day    Packs/day: 1.50    Years: 25.00    Total pack years: 37.50    Types: Cigarettes   Smokeless tobacco: Current    Types: Chew   Tobacco comments:    07/18/22 Smokes 1/2 ppd not ready to quit given smoking cessation information  Vaping Use   Vaping Use: Never used  Substance and Sexual Activity   Alcohol use: Yes    Alcohol/week: 24.0 standard drinks of alcohol    Types: 24 Cans of beer per week    Comment: 6 cans every other day 05/31/22   Drug use: Not Currently    Frequency: 2.0 times per week    Types: Marijuana    Comment: he took Marijuana 2 weeks ago   Sexual activity: Yes  Partners: Female  Other Topics Concern   Not on file  Social History Narrative   Not on file   Social Determinants of Health   Financial Resource Strain: Low Risk  (11/01/2020)   Overall Financial Resource Strain (CARDIA)    Difficulty of Paying Living Expenses: Not hard at all  Food Insecurity: No Food Insecurity (11/01/2020)   Hunger Vital Sign    Worried About Running Out of Food in the Last Year: Never true    Ran Out of Food in the Last Year: Never true  Transportation Needs: No Transportation Needs (11/01/2020)   PRAPARE - Hydrologist (Medical): No    Lack of Transportation (Non-Medical): No  Physical Activity: Not on file  Stress: Not on file  Social Connections: Not on file     Family History: The patient's family history includes Birth defects in his paternal aunt; COPD in his maternal uncle; Gout in his brother; Hypertension in his brother; Stroke in his paternal aunt.  ROS:   Please see the history of present illness.     All other systems reviewed and are negative.  EKGs/Labs/Other Studies Reviewed:    The following studies were reviewed today:   EKG:   07/23/2022: Normal sinus rhythm, rate 64, Q waves in V1/2 04/25/2022: Normal sinus  rhythm, rate 67, Q waves in V1/2 12/13/2021: Sinus rhythm, rate 71, PVC, poor R wave progression  Recent Labs: 09/01/2021: ALT 32 10/11/2021: TSH 2.670 05/31/2022: B Natriuretic Peptide 621.5; BUN 23; Creatinine, Ser 1.51; Hemoglobin 18.5; Magnesium 2.3; Platelets 238; Potassium 4.3; Sodium 138  Recent Lipid Panel    Component Value Date/Time   CHOL 143 04/25/2022 1543   TRIG 102 04/25/2022 1543   HDL 65 04/25/2022 1543   CHOLHDL 2.2 04/25/2022 1543   CHOLHDL 3.3 01/26/2021 1213   VLDL 21 01/26/2021 1213   LDLCALC 59 04/25/2022 1543    Physical Exam:    VS:  BP 118/78 (BP Location: Left Arm, Patient Position: Sitting, Cuff Size: Normal)   Pulse 64   Ht '6\' 2"'$  (1.88 m)   Wt 215 lb (97.5 kg)   BMI 27.60 kg/m     Wt Readings from Last 3 Encounters:  07/23/22 215 lb (97.5 kg)  07/18/22 211 lb 6.7 oz (95.9 kg)  06/14/22 213 lb 3.2 oz (96.7 kg)     GEN:  in no acute distress HEENT: Normal NECK: No JVD CARDIAC: RRR no murmurs RESPIRATORY:  Clear to auscultation without rales, wheezing or rhonchi  ABDOMEN: Soft, non-tender, non-distended MUSCULOSKELETAL: No edema; No deformity  SKIN: Warm and dry NEUROLOGIC:  Alert and oriented x 3 PSYCHIATRIC:  Normal affect   ASSESSMENT:    1. CAD in native artery   2. Chronic combined systolic and diastolic congestive heart failure (HCC)   3. Persistent atrial fibrillation ([place])   4. Essential hypertension   5. PAD (peripheral artery disease) ([place])   6. Daytime somnolence   7. Hyperlipidemia, unspecified hyperlipidemia type       PLAN:    CAD: Reported exertional chest pain, coronary CTA was done on 12/04/2021, which showed obstructive CAD in mid RCA  [facility] 12/24/2021 showed severe mid RCA stenosis status post DES. -Continue Eliquis, Plavix  -Continue statin  -Continue bisoprol -Continue cardiac rehab  Chronic combined systolic and diastolic heart failure: New diagnosis in the setting of afib w/rvr, alcohol use and heavy smoking  history. 10/2020 EF 20%, BIV failure. ECHO 01/26/21 with significant improvement in EF 45-50%,  normal RV function.  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 -NYHA Class II symptoms, euvolemic on exam -continue spironolactone 25 mg daily.   -continue entresto 97/103 -continue bisoprolol 10 mg daily -Continue Farxiga 10 mg daily -Continue hydralazine 25 mg twice daily -Check CMET   Persistent Afib: new diagnosis 10/2020. S/p TEE/DCCV 11/01/20.  Required cardioversion in ED for atrial flutter 08/2021.  He was referred to EP and underwent ablation with Dr. Curt Bears 05/2022.  He underwent DCCV in ED 05/31/2022 and started on amiodarone. -Continue amiodarone 200 mg daily.  Check CMET, TSH -CHADS2VASC 3, continue eliquis -continue bisoprolol  -Check sleep study  Tobacco Use: Counseled on the risks of tobacco use and cessation strongly encouraged.  Working with care guide  Polycythemia: Likely secondary to smoking. Hematology following. Jak 2 negative   Alcohol Use: Had cut back on alcohol but now drinking 2 beers per day.  Encouraged cessation  Hypertension: Continue Entresto, bisoprolol, hydralazine, and spironolactone as above  PAD: Reported severe left calf claudication with rest pain and ischemic changes in toes.  Angiography 03/2021 showed no significant aortoiliac disease on the left with distal SFA occlusion into the proximal popliteal artery, severe focal stenosis in distal right SFA.  Follows with Dr. Fletcher Anon, underwent successful angioplasty and stent placement to left SFA into proximal popliteal artery.  Postprocedure ABIs showed improvement with normal ABIs on left, mild disease (0.81) on right -Continue Eliquis -Continue rosuvastatin -Smoking cessation recommended  Hyperlipidemia: LDL 95 on 11/22/2021, on rosuvastatin 20 mg daily.  Rosuvastatin increased to 40 mg daily.  LDL remain 95 on 01/16/2022, Zetia 10 mg daily added.  LDL 59 on 04/25/22  Daytime somnolence: Check [place] sleep  study   RTC in 3 months   Medication Adjustments/Labs and Tests Ordered: Current medicines are reviewed at length with the patient today.  Concerns regarding medicines are outlined above.  Orders Placed This Encounter  Procedures   Comprehensive metabolic panel   TSH   EKG 12-Lead   No orders of the defined types were placed in this encounter.   Patient Instructions  Medication Instructions:  Your physician recommends that you continue on your current medications as directed. Please refer to the Current Medication list given to you today.  *If you need a refill on your cardiac medications before your next appointment, please call your pharmacy*  Lab Work: CMET, TSH today  If you have labs (blood work) drawn today and your tests are completely normal, you will receive your results only by: [place] (if you have MyChart) OR A paper copy in the mail If you have any lab test that is abnormal or we need to change your treatment, we will call you to review the results.  Follow-Up: At [facility], you and your health needs are our priority.  As part of our continuing mission to provide you with exceptional heart care, we have created designated Provider Care Teams.  These Care Teams include your primary Cardiologist (physician) and Advanced Practice Providers (APPs -  Physician Assistants and Nurse Practitioners) who all work together to provide you with the care you need, when you need it.  We recommend signing up for the patient portal called "MyChart".  Sign up information is provided on this After Visit Summary.  MyChart is used to connect with patients for Virtual Visits (Telemedicine).  Patients are able to view lab/test results, encounter notes, upcoming appointments, etc.  Non-urgent messages can be sent to your provider as well.   To learn more about  what you can do with MyChart, go to [email or web address].    Your next appointment:   4 month(s)  The  format for your next appointment:   In Person  Provider:   [name], MD         Signed, [name], MD  07/23/2022 2:59 PM    [place]

## 2022-07-24 ENCOUNTER — Encounter (HOSPITAL_COMMUNITY)
Admission: RE | Admit: 2022-07-24 | Discharge: 2022-07-24 | Disposition: A | Discharge status: Home / Self Care | Payer: 59 | Source: Ambulatory Visit | Attending: Cardiology | Admitting: Cardiology

## 2022-07-24 ENCOUNTER — Other Ambulatory Visit: Payer: Self-pay | Admitting: *Deleted

## 2022-07-24 ENCOUNTER — Ambulatory Visit: Payer: Self-pay | Admitting: Psychologist

## 2022-07-24 DIAGNOSIS — I4819 Other persistent atrial fibrillation: Secondary | ICD-10-CM

## 2022-07-24 DIAGNOSIS — R7989 Other specified abnormal findings of blood chemistry: Secondary | ICD-10-CM

## 2022-07-24 DIAGNOSIS — E875 Hyperkalemia: Secondary | ICD-10-CM

## 2022-07-24 DIAGNOSIS — Z955 Presence of coronary angioplasty implant and graft: Secondary | ICD-10-CM | POA: Diagnosis not present

## 2022-07-24 DIAGNOSIS — I5022 Chronic systolic (congestive) heart failure: Secondary | ICD-10-CM

## 2022-07-24 LAB — COMPREHENSIVE METABOLIC PANEL
ALT: 39 IU/L (ref 0–44)
AST: 28 IU/L (ref 0–40)
Albumin/Globulin Ratio: 2.4 — ABNORMAL HIGH (ref 1.2–2.2)
Albumin: 4.8 g/dL (ref 3.8–4.9)
Alkaline Phosphatase: 85 IU/L (ref 44–121)
BUN/Creatinine Ratio: 11 (ref 9–20)
BUN: 13 mg/dL (ref 6–24)
Bilirubin Total: 1.3 mg/dL — ABNORMAL HIGH (ref 0.0–1.2)
CO2: 21 mmol/L (ref 20–29)
Calcium: 9.4 mg/dL (ref 8.7–10.2)
Chloride: 100 mmol/L (ref 96–106)
Creatinine, Ser: 1.22 mg/dL (ref 0.76–1.27)
Globulin, Total: 2 g/dL (ref 1.5–4.5)
Glucose: 95 mg/dL (ref 70–99)
Potassium: 5.5 mmol/L — ABNORMAL HIGH (ref 3.5–5.2)
Sodium: 138 mmol/L (ref 134–144)
Total Protein: 6.8 g/dL (ref 6.0–8.5)
eGFR: 68 mL/min/{1.73_m2} (ref >59)

## 2022-07-24 LAB — TSH: TSH: 7.2 u[IU]/mL — ABNORMAL HIGH (ref 0.450–4.500)

## 2022-07-24 NOTE — Progress Notes (Signed)
QUALITY OF LIFE SCORE REVIEW  Pt completed Quality of Life survey as a participant in Cardiac Rehab.  Scores 21.0 or below are considered low.  Pt score very low in several areas Overall 13.53, Health and Function 12.37, socioeconomic 13.57, physiological and spiritual 13.14, family 18.50. Patient quality of life slightly altered by physical constraints which limits ability to perform as prior to recent cardiac illness.  Christopher Gutierrez says he has not had any chest pain recently. Has a lot of stress regarding his job as he is not sure he will be able to return. Christopher Gutierrez also has to take care of his elderly father and is wondering whether he will be able to continue to care for him at home. Offered emotional support and reassurance.  Will continue to monitor and intervene as necessary. Christopher Gutierrez says he receives counseling from [facility], which he finds helpful. Permission granted by Christopher Gutierrez to forward quality of life questionnaire to Christopher Gutierrez for review.Will continue to monitor the patient throughout  the [place] RN BSN

## 2022-07-26 ENCOUNTER — Encounter (HOSPITAL_COMMUNITY)
Admission: RE | Admit: 2022-07-26 | Discharge: 2022-07-26 | Disposition: A | Discharge status: Home / Self Care | Payer: 59 | Source: Ambulatory Visit | Attending: Cardiology | Admitting: Cardiology

## 2022-07-26 DIAGNOSIS — Z955 Presence of coronary angioplasty implant and graft: Secondary | ICD-10-CM

## 2022-07-26 DIAGNOSIS — I5022 Chronic systolic (congestive) heart failure: Secondary | ICD-10-CM

## 2022-07-31 ENCOUNTER — Encounter (HOSPITAL_COMMUNITY)
Admission: RE | Admit: 2022-07-31 | Discharge: 2022-07-31 | Disposition: A | Discharge status: Home / Self Care | Payer: 59 | Source: Ambulatory Visit | Attending: Cardiology | Admitting: Cardiology

## 2022-07-31 DIAGNOSIS — I5022 Chronic systolic (congestive) heart failure: Secondary | ICD-10-CM

## 2022-07-31 DIAGNOSIS — Z955 Presence of coronary angioplasty implant and graft: Secondary | ICD-10-CM

## 2022-08-01 ENCOUNTER — Other Ambulatory Visit: Payer: Self-pay

## 2022-08-01 DIAGNOSIS — E785 Hyperlipidemia, unspecified: Secondary | ICD-10-CM

## 2022-08-02 ENCOUNTER — Encounter (HOSPITAL_COMMUNITY)
Admission: RE | Admit: 2022-08-02 | Discharge: 2022-08-02 | Disposition: A | Discharge status: Home / Self Care | Payer: 59 | Source: Ambulatory Visit | Attending: Cardiology | Admitting: Cardiology

## 2022-08-02 DIAGNOSIS — Z955 Presence of coronary angioplasty implant and graft: Secondary | ICD-10-CM | POA: Diagnosis not present

## 2022-08-02 DIAGNOSIS — I5022 Chronic systolic (congestive) heart failure: Secondary | ICD-10-CM

## 2022-08-02 LAB — BASIC METABOLIC PANEL
BUN/Creatinine Ratio: 13 (ref 9–20)
BUN: 14 mg/dL (ref 6–24)
CO2: 19 mmol/L — ABNORMAL LOW (ref 20–29)
Calcium: 8.7 mg/dL (ref 8.7–10.2)
Chloride: 100 mmol/L (ref 96–106)
Creatinine, Ser: 1.11 mg/dL (ref 0.76–1.27)
Glucose: 97 mg/dL (ref 70–99)
Potassium: 4.4 mmol/L (ref 3.5–5.2)
Sodium: 137 mmol/L (ref 134–144)
eGFR: 76 mL/min/{1.73_m2} (ref >59)

## 2022-08-02 LAB — HEPATIC FUNCTION PANEL
ALT: 39 IU/L (ref 0–44)
AST: 25 IU/L (ref 0–40)
Albumin: 4.6 g/dL (ref 3.8–4.9)
Alkaline Phosphatase: 78 IU/L (ref 44–121)
Bilirubin Total: 1.1 mg/dL (ref 0.0–1.2)
Bilirubin, Direct: 0.3 mg/dL (ref 0.00–0.40)
Total Protein: 6.5 g/dL (ref 6.0–8.5)

## 2022-08-02 LAB — T4, FREE: Free T4: 1.59 ng/dL (ref 0.82–1.77)

## 2022-08-02 LAB — TSH: TSH: 5.4 u[IU]/mL — ABNORMAL HIGH (ref 0.450–4.500)

## 2022-08-06 NOTE — Progress Notes (Signed)
Cardiac Individual Treatment Plan  Patient Details  Name: Christopher Gutierrez MRN: [id number] Date of Birth: 02/19/1961 Referring Provider:   Flowsheet Row INTENSIVE CARDIAC REHAB ORIENT from 07/18/2022 in [facility] for Heart, Vascular, & [place]  Referring Provider Dr Oswaldo Milian, MD       Initial Encounter Date:  [place] from 07/18/2022 in [facility], [facility], [facility]  Date 07/18/22       Visit Diagnosis: 12/24/21 S/P DES RCA  Heart failure, chronic systolic ([place])  Patient's Home Medications on Admission:  Current Outpatient Medications:    acetaminophen (TYLENOL) 500 MG tablet, Take 500 mg by mouth every 6 (six) hours as needed for moderate pain., Disp: , Rfl:    amiodarone (PACERONE) 200 MG tablet, Take 2 tablets (400 mg total) by mouth 2 (two) times daily for 10 days, THEN 1 tablet (200 mg total) daily., Disp: 130 tablet, Rfl: 0   apixaban (ELIQUIS) 5 MG TABS tablet, TAKE 1 TABLET BY MOUTH TWICE A DAY, Disp: 180 tablet, Rfl: 2   bisoprolol (ZEBETA) 10 MG tablet, Take 1 tablet (10 mg total) by mouth daily., Disp: 90 tablet, Rfl: 3   cetirizine (ZYRTEC) 10 MG tablet, Take 10 mg by mouth as needed for allergies., Disp: , Rfl:    clopidogrel (PLAVIX) 75 MG tablet, TAKE 1 TABLET BY MOUTH EVERY DAY, Disp: 90 tablet, Rfl: 3   dapagliflozin propanediol (FARXIGA) 10 MG TABS tablet, Take 1 tablet (10 mg total) by mouth daily before breakfast. NEEDS FOLLOW UP APPOINTMENT FOR ANYMORE REFILLS, Disp: 30 tablet, Rfl: 3   ENTRESTO 97-103 MG, TAKE 1 TABLET BY MOUTH TWICE A DAY, Disp: 60 tablet, Rfl: 6   ezetimibe (ZETIA) 10 MG tablet, Take 1 tablet (10 mg total) by mouth daily., Disp: 90 tablet, Rfl: 3   famotidine-calcium carbonate-magnesium hydroxide (PEPCID COMPLETE) 10-800-165 MG chewable tablet, Chew 1 tablet by mouth daily as needed (acid reflux)., Disp: , Rfl:     hydrALAZINE (APRESOLINE) 25 MG tablet, TAKE 1 TABLET (25 MG) BY MOUTH IN THE MORNING AND AT BEDTIME, Disp: 180 tablet, Rfl: 0   nitroGLYCERIN (NITROSTAT) 0.4 MG SL tablet, Place 1 tablet (0.4 mg total) under the tongue every 5 (five) minutes as needed., Disp: 25 tablet, Rfl: 3   Polyvinyl Alcohol-Povidone (REFRESH OP), Place 1 drop into both eyes daily as needed (dry eyes)., Disp: , Rfl:    rosuvastatin (CRESTOR) 40 MG tablet, TAKE 1 TABLET BY MOUTH EVERY DAY, Disp: 90 tablet, Rfl: 0   SIMBRINZA 1-0.2 % SUSP, Apply 1 drop to eye 2 (two) times daily., Disp: , Rfl:    spironolactone (ALDACTONE) 25 MG tablet, Take 1 tablet (25 mg total) by mouth daily., Disp: 90 tablet, Rfl: 3  Past Medical History: Past Medical History:  Diagnosis Date   Atrial fibrillation ([place]) 10/24/2020   Atrial fibrillation and flutter (HCC)    CHF (congestive heart failure) ([place])    Coronary artery disease    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   Hyperlipidemia    Hypertension     Tobacco Use: Social History   Tobacco Use  Smoking Status Every Day   Packs/day: 1.50   Years: 25.00   Total pack years: 37.50   Types: Cigarettes  Smokeless Tobacco Current   Types: Chew  Tobacco Comments   07/18/22 Smokes 1/2 ppd not ready to quit given smoking cessation information  Labs: Review Flowsheet  More data exists      Latest Ref Rng & Units 10/31/2020 01/26/2021 11/22/2021 01/16/2022 04/25/2022  Labs for ITP Cardiac and Pulmonary Rehab  Cholestrol 100 - 199 mg/dL 153  229  181  185  143   LDL (calc) 0 - 99 mg/dL 103  139  95  95  59   HDL-C >39 mg/dL 36  69  65  72  65   Trlycerides 0 - 149 mg/dL 68  105  119  99  102     Capillary Blood Glucose: No results found for: "GLUCAP"   Exercise Target Goals: Exercise Program Goal: Individual exercise prescription set using results from initial 6 min walk test and THRR while considering  patient's activity barriers and safety.   Exercise  Prescription Goal: Initial exercise prescription builds to 30-45 minutes a day of aerobic activity, 2-3 days per week.  Home exercise guidelines will be given to patient during program as part of exercise prescription that the participant will acknowledge.  Activity Barriers & Risk Stratification:  Activity Barriers & Cardiac Risk Stratification - 07/18/22 1451       Activity Barriers & Cardiac Risk Stratification   Activity Barriers Deconditioning;Muscular Weakness;Back Problems;Balance Concerns    Cardiac Risk Stratification High             6 Minute Walk:  6 Minute Walk     Row Name 07/18/22 1445         6 Minute Walk   Phase Initial     Distance 1320 feet     Walk Time 6 minutes     # of Rest Breaks 0     MPH 2.5     METS 3.47     RPE 14     Perceived Dyspnea  0     VO2 Peak 12.14     Symptoms Yes (comment)     Comments Bilateral calf tightness/pain 3/10 resolved with rest     Resting HR 55 bpm     Resting BP 118/62     Resting Oxygen Saturation  97 %     Exercise Oxygen Saturation  during 6 min walk 98 %     Max Ex. HR 80 bpm     Max Ex. BP 130/74     2 Minute Post BP 122/74              Oxygen Initial Assessment:   Oxygen Re-Evaluation:   Oxygen Discharge (Final Oxygen Re-Evaluation):   Initial Exercise Prescription:  Initial Exercise Prescription - 07/18/22 1400       Date of Initial Exercise RX and Referring Provider   Date 07/18/22    Referring Provider Dr Oswaldo Milian, MD    Expected Discharge Date 09/13/21      NuStep   Level 2    Minutes 15    METs 1.8      Arm Ergometer   Level 2    Minutes 15    METs 1.8      Prescription Details   Frequency (times per week) 3    Duration Progress to 30 minutes of continuous aerobic without signs/symptoms of physical distress      Intensity   THRR 40-80% of Max Heartrate 64-129    Ratings of Perceived Exertion 11-13    Perceived Dyspnea 0-4      Progression   Progression  Continue progressive overload as per policy without signs/symptoms or physical distress.  Resistance Training   Training Prescription Yes    Weight 3 lbs wts    Reps 10-15             Perform Capillary Blood Glucose checks as needed.  Exercise Prescription Changes:   Exercise Prescription Changes     Row Name 07/22/22 1626             Response to Exercise   Blood Pressure (Admit) 114/80       Blood Pressure (Exercise) 142/84       Blood Pressure (Exit) 144/80       Heart Rate (Admit) 59 bpm       Heart Rate (Exercise) 78 bpm       Heart Rate (Exit) 67 bpm       Rating of Perceived Exertion (Exercise) 11.75       Perceived Dyspnea (Exercise) 0       Symptoms 0       Comments Pt first day in the CRP2 program       Duration Progress to 30 minutes of  aerobic without signs/symptoms of physical distress       Intensity THRR unchanged         Progression   Progression Continue to progress workloads to maintain intensity without signs/symptoms of physical distress.       Average METs 1.75         Resistance Training   Training Prescription Yes       Weight 3 lbs wts       Reps 10-15       Time 10 Minutes         Arm Ergometer   Level 2       Minutes 15       METs 1.6         T5 Nustep   Level 2       SPM 67       Minutes 15       METs 1.9                Exercise Comments:   Exercise Comments     Row Name 07/22/22 1631           Exercise Comments Pt first day in the CRP2 program. Pt tolerated exercise well with an average MET level of 1.75. Pt is learning his THRR, RPE and ExRx. Will continue to progress pt as tolerated without sign or symptom                Exercise Goals and Review:   Exercise Goals     Row Name 07/18/22 1454             Exercise Goals   Increase Physical Activity Yes       Intervention Provide advice, education, support and counseling about physical activity/exercise needs.;Develop an individualized  exercise prescription for aerobic and resistive training based on initial evaluation findings, risk stratification, comorbidities and participant's personal goals.       Expected Outcomes Short Term: Attend rehab on a regular basis to increase amount of physical activity.;Long Term: Exercising regularly at least 3-5 days a week.;Long Term: Add in home exercise to make exercise part of routine and to increase amount of physical activity.       Increase Strength and Stamina Yes       Intervention Provide advice, education, support and counseling about physical activity/exercise needs.;Develop an individualized exercise prescription for aerobic and resistive training based on  initial evaluation findings, risk stratification, comorbidities and participant's personal goals.       Expected Outcomes Short Term: Increase workloads from initial exercise prescription for resistance, speed, and METs.;Short Term: Perform resistance training exercises routinely during rehab and add in resistance training at home;Long Term: Improve cardiorespiratory fitness, muscular endurance and strength as measured by increased METs and functional capacity (6MWT)       Able to understand and use rate of perceived exertion (RPE) scale Yes       Intervention Provide education and explanation on how to use RPE scale       Expected Outcomes Short Term: Able to use RPE daily in rehab to express subjective intensity level;Long Term:  Able to use RPE to guide intensity level when exercising independently       Knowledge and understanding of Target Heart Rate Range (THRR) Yes       Intervention Provide education and explanation of THRR including how the numbers were predicted and where they are located for reference       Expected Outcomes Short Term: Able to state/look up THRR;Long Term: Able to use THRR to govern intensity when exercising independently;Short Term: Able to use daily as guideline for intensity in rehab       Understanding  of Exercise Prescription Yes       Intervention Provide education, explanation, and written materials on patient's individual exercise prescription       Expected Outcomes Short Term: Able to explain program exercise prescription;Long Term: Able to explain home exercise prescription to exercise independently                Exercise Goals Re-Evaluation :  Exercise Goals Re-Evaluation     Row Name 07/22/22 1629             Exercise Goal Re-Evaluation   Exercise Goals Review Increase Physical Activity;Understanding of Exercise Prescription;Increase Strength and Stamina;Knowledge and understanding of Target Heart Rate Range (THRR);Able to understand and use rate of perceived exertion (RPE) scale       Comments Pt first day in the CRP2 program. Pt tolerated exercise well with an average MET level of 1.75. Pt is learning his THRR, RPE and ExRx.       Expected Outcomes Will continue to progress pt as tolerated without sign or symptom                Discharge Exercise Prescription (Final Exercise Prescription Changes):  Exercise Prescription Changes - 07/22/22 1626       Response to Exercise   Blood Pressure (Admit) 114/80    Blood Pressure (Exercise) 142/84    Blood Pressure (Exit) 144/80    Heart Rate (Admit) 59 bpm    Heart Rate (Exercise) 78 bpm    Heart Rate (Exit) 67 bpm    Rating of Perceived Exertion (Exercise) 11.75    Perceived Dyspnea (Exercise) 0    Symptoms 0    Comments Pt first day in the CRP2 program    Duration Progress to 30 minutes of  aerobic without signs/symptoms of physical distress    Intensity THRR unchanged      Progression   Progression Continue to progress workloads to maintain intensity without signs/symptoms of physical distress.    Average METs 1.75      Resistance Training   Training Prescription Yes    Weight 3 lbs wts    Reps 10-15    Time 10 Minutes      Arm Ergometer  Level 2    Minutes 15    METs 1.6      T5 Nustep   Level  2    SPM 67    Minutes 15    METs 1.9             Nutrition:  Target Goals: Understanding of nutrition guidelines, daily intake of sodium <1525m, cholesterol <2030m calories 30% from fat and 7% or less from saturated fats, daily to have 5 or more servings of fruits and vegetables.  Biometrics:  Pre Biometrics - 07/18/22 1443       Pre Biometrics   Waist Circumference 44 inches    Hip Circumference 43.5 inches    Waist to Hip Ratio 1.01 %    Triceps Skinfold 14 mm    % Body Fat 28.1 %    Grip Strength 45 kg    Flexibility --   Pt unable to reach   Single Leg Stand 13.7 seconds              Nutrition Therapy Plan and Nutrition Goals:  Nutrition Therapy & Goals - 07/22/22 1625       Nutrition Therapy   Diet Heart Healthy Diet    Drug/Food Interactions Statins/Certain Fruits      Personal Nutrition Goals   Nutrition Goal Patient to identify strategies for reducing cardiovascular risk by attending the [employer] education and nutrition series    Personal Goal #2 Patient to improve diet quality by using the plate method as a guide for meal planning to including lean protein/plant protein, fruits, vegetables, whole gains and nonfat dairy.    Personal Goal #3 Patient to reduce sodium intake to <150058mer day    Comments Christopher Gutierrez enjoying a variety of foods and eating 2-3 meals per day. He reports drinking kool aid daily and 4 regular beers multiple times per week. 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 He does enjoy cooking and is looking forward to the [employer] nutrition series. Christopher Beltsll benefit from attending [facility] ICR and adherance to the Pritikin eating plan to support improved diet quality, sodium intake <1500m36my, LDL <70, and HDL >40.      Intervention Plan   Intervention Prescribe, educate and counsel regarding individualized specific dietary modifications aiming towards targeted core components such as weight, hypertension, lipid management,  diabetes, heart failure and other comorbidities.;Nutrition handout(s) given to patient.    Expected Outcomes Short Term Goal: Understand basic principles of dietary content, such as calories, fat, sodium, cholesterol and nutrients.;Long Term Goal: Adherence to prescribed nutrition plan.             Nutrition Assessments:  Nutrition Assessments - 07/23/22 0921       Rate Your Plate Scores   Pre Score 56            MEDIFICTS Score Key: ?70 Need to make dietary changes  40-70 Heart Healthy Diet ? 40 Therapeutic Level Cholesterol Diet   Flowsheet Row INTENSIVE CARDIAC REHAB [facility]  Picture Your Plate Total Score on Admission 56      Picture Your Plate Scores: <40 <76ealthy dietary pattern with much room for improvement. 41-50 Dietary pattern unlikely to meet recommendations for good health and room for improvement. 51-60 More healthful dietary pattern, with some room for improvement.  >60 Healthy dietary pattern, although there may be some specific behaviors that could be improved.    Nutrition Goals  Re-Evaluation:  Nutrition Goals Re-Evaluation     [place] Name 07/22/22 1625             Goals   Current Weight 212 lb 8.4 oz (96.4 kg)       Comment lipids WNL, GFR 53, Cr 1.51       Expected Outcome Christopher Gutierrez reports enjoying a variety of foods and eating 2-3 meals per day. He reports drinking kool aid daily and 4 regular beers multiple times per week. 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 He does enjoy cooking and is looking forward to the [employer] nutrition series. Christopher Gutierrez will benefit from attending [facility] ICR and adherance to the [employer] eating plan to support improved diet quality, sodium intake <1542m/day, LDL <70, and HDL >40.                Nutrition Goals Re-Evaluation:  Nutrition Goals Re-Evaluation     [place] 07/22/22 1625             Goals   Current Weight  212 lb 8.4 oz (96.4 kg)       Comment lipids WNL, GFR 53, Cr 1.51       Expected Outcome Christopher Beltsreports enjoying a variety of foods and eating 2-3 meals per day. He reports drinking kool aid daily and 4 regular beers multiple times per week. 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 He does enjoy cooking and is looking forward to the [employer] nutrition series. Christopher Beltswill benefit from attending [facility] ICR and adherance to the Pritikin eating plan to support improved diet quality, sodium intake <15020mday, LDL <70, and HDL >40.                Nutrition Goals Discharge (Final Nutrition Goals Re-Evaluation):  Nutrition Goals Re-Evaluation - 07/22/22 1625       Goals   Current Weight 212 lb 8.4 oz (96.4 kg)    Comment lipids WNL, GFR 53, Cr 1.51    Expected Outcome Christopher Beltseports enjoying a variety of foods and eating 2-3 meals per day. He reports drinking kool aid daily and 4 regular beers multiple times per week. 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 He does enjoy cooking and is looking forward to the [employer] nutrition series. Christopher Beltsill benefit from attending [facility] ICR and adherance to the Pritikin eating plan to support improved diet quality, sodium intake <150061may, LDL <70, and HDL >40.             Psychosocial: Target Goals: Acknowledge presence or absence of significant depression and/or stress, maximize coping skills, provide positive support system. Participant is able to verbalize types and ability to use techniques and skills needed for reducing stress and depression.  Initial Review & Psychosocial Screening:  Initial Psych Review & Screening - 07/22/22 1540       Initial Review   Current issues with Current Stress Concerns    Source of Stress Concerns Chronic Illness;Occupation;Unable to participate in former interests or hobbies;Family;Unable to perform yard/household activities;Financial    Comments will continue to monitor and offer support as needed.  Reviewed quality of life questionnaire on 07/24/22 has many life stressors is receiving counselling      Family Dynamics   Good Support System? No    Strains Illness and family care strain    Concerns Inappropriate over/under dependence on family/friends      Barriers   Psychosocial barriers to participate in program The patient should benefit from  training in stress management and relaxation.      Screening Interventions   Interventions Encouraged to exercise;To provide support and resources with identified psychosocial needs;Provide feedback about the scores to participant    Expected Outcomes Long Term Goal: Stressors or current issues are controlled or eliminated.;Short Term goal: Utilizing psychosocial counselor, staff and physician to assist with identification of specific Stressors or current issues interfering with healing process. Setting desired goal for each stressor or current issue identified.;Short Term goal: Identification and review with participant of any Quality of Life or Depression concerns found by scoring the questionnaire.;Long Term goal: The participant improves quality of Life and PHQ9 Scores as seen by post scores and/or verbalization of changes             Quality of Life Scores:  Quality of Life - 07/18/22 1458       Quality of Life   Select Quality of Life      Quality of Life Scores   Health/Function Pre 12.37 %    Socioeconomic Pre 13.57 %    Psych/Spiritual Pre 13.14 %    Family Pre 18.5 %    GLOBAL Pre 13.53 %            Scores of 19 and below usually indicate a poorer quality of life in these areas.  A difference of  2-3 points is a clinically meaningful difference.  A difference of 2-3 points in the total score of the Quality of Life Index has been associated with significant improvement in overall quality of life, self-image, physical symptoms, and general health in studies assessing change in quality of life.  PHQ-9: Review Flowsheet   More data may exist      07/18/2022 07/13/2021 05/17/2021 10/12/2020 09/29/2017  Depression screen PHQ 2/9  Decreased Interest _0 0  Down, Depressed, Hopeless 0 0 2 1 0  PHQ - 2 Score _1 0  Altered sleeping - - 2 0 -  Tired, decreased energy - - 1 2 -  Change in appetite - - 0 0 -  Feeling bad or failure about yourself  - - 1 2 -  Trouble concentrating - - 1 2 -  Moving slowly or fidgety/restless - - 1 2 -  Suicidal thoughts - - 0 0 -  PHQ-9 Score - - 9 10 -  Difficult doing work/chores - - Somewhat difficult Somewhat difficult -   Interpretation of Total Score  Total Score Depression Severity:  1-4 = Minimal depression, 5-9 = Mild depression, 10-14 = Moderate depression, 15-19 = Moderately severe depression, 20-27 = Severe depression   Psychosocial Evaluation and Intervention:   Psychosocial Re-Evaluation:  Psychosocial Re-Evaluation     Row Name 07/22/22 1540 08/06/22 1542 08/06/22 1543         Psychosocial Re-Evaluation   Current issues with Current Stress Concerns Current Stress Concerns Current Stress Concerns     Comments No increased concerns or stressors voiced on his first day of exercise. Will review quality of life questionnaire in the upcoming week. No increased concerns or stressors voiced on his first day of exercise. Quality of life reviewed. Receives counseling from [place] [facility] No increased concerns or stressors voiced on his first day of exercise. Quality of life reviewed. Receives counseling from [place] will have decreased  or better controlled stress upon completion of intensive cardiac rehab. Christopher Gutierrez will have decreased  or better  controlled stress upon completion of intensive cardiac rehab. Christopher Gutierrez will have decreased  or better controlled stress upon completion of intensive cardiac rehab.     Interventions Stress management education;Encouraged to attend Cardiac Rehabilitation for the exercise;Relaxation  education Stress management education;Encouraged to attend Cardiac Rehabilitation for the exercise;Relaxation education Stress management education;Encouraged to attend Cardiac Rehabilitation for the exercise;Relaxation education     Continue Psychosocial Services  Follow up required by staff Follow up required by staff Follow up required by staff       Initial Review   Source of Stress Concerns -- Chronic Illness;Occupation;Unable to participate in former interests or hobbies;Family;Unable to perform yard/household activities;Financial Chronic Illness;Occupation;Unable to participate in former interests or hobbies;Family;Unable to perform yard/household activities;Financial     Comments -- will continue to monitor and offer support as needed. Reviewed quality of life questionnaire on 07/24/22 has many life stressors is receiving counselling will continue to monitor and offer support as needed. Reviewed quality of life questionnaire on 07/24/22 has many life stressors is receiving counselling              Psychosocial Discharge (Final Psychosocial Re-Evaluation):  Psychosocial Re-Evaluation - 08/06/22 1543       Psychosocial Re-Evaluation   Current issues with Current Stress Concerns    Comments No increased concerns or stressors voiced on his first day of exercise. Quality of life reviewed. Receives counseling from [place] will have decreased  or better controlled stress upon completion of intensive cardiac rehab.    Interventions Stress management education;Encouraged to attend Cardiac Rehabilitation for the exercise;Relaxation education    Continue Psychosocial Services  Follow up required by staff      Initial Review   Source of Stress Concerns Chronic Illness;Occupation;Unable to participate in former interests or hobbies;Family;Unable to perform yard/household activities;Financial    Comments will continue to monitor and offer support as needed.  Reviewed quality of life questionnaire on 07/24/22 has many life stressors is receiving counselling             Vocational Rehabilitation: Provide vocational rehab assistance to qualifying candidates.   Vocational Rehab Evaluation & Intervention:  Vocational Rehab - 07/18/22 1558       Initial Vocational Rehab Evaluation & Intervention   Assessment shows need for Vocational Rehabilitation No   Christopher Gutierrez is not currently working as he was on long term disabilty he will let is know in January if he is interested in participating in Vocational Rehab            Education: Education Goals: Education classes will be provided on a weekly basis, covering required topics. Participant will state understanding/return demonstration of topics presented.    Education     Row Name 07/22/22 1500     Education   Cardiac Education Topics [employer]ikin   IT sales professional Nutrition   Nutrition Workshop Fueling a Designer, multimedia   Instruction Review Code 1- Engineer, civil (consulting) Start Time 1300   Class Stop Time 1345   Class Time Calculation (min) 45 min    [place] Name 07/24/22 1500     Education   Cardiac Education Topics Pritikin   Financial trader   Weekly Topic [employer] [employer]- [employer] on [employer] [place]   Instruction Review Code 1- Verbalizes Understanding   Class Start Time 6644  Class Stop Time 1435   Class Time Calculation (min) 40 min    [place] Name 07/26/22 1500     Education   Cardiac Education Topics Pritikin   [employer] Workshops     Workshops   Educator Exercise Physiologist   [facility] Psychosocial   Psychosocial Workshop Recognizing and Reducing Stress   Instruction Review Code 1- Verbalizes Understanding   Class Start Time 1355   Class Stop Time 1445   Class Time Calculation (min) 50 min    [place] Name 07/31/22 1600     Education   Cardiac Education Topics Pritikin    Charity fundraiser Exercise Physiologist   Select Nutrition   Nutrition Cooking - Healthy Salads and Dressing   Instruction Review Code 1- Verbalizes Understanding   Class Start Time 1358   Class Stop Time 1442   Class Time Calculation (min) 44 min    [place] Name 08/02/22 1500     Education   Cardiac Education Topics Pritikin   Architect Education   General Education Heart Disease Risk Reduction   Instruction Review Code 1- Verbalizes Understanding   Class Start Time 1400   Class Stop Time 1440   Class Time Calculation (min) 40 min            Core Videos: Exercise    Move It!  Clinical staff conducted group or individual video education with verbal and written material and guidebook.  Patient learns the recommended Pritikin exercise program. Exercise with the goal of living a long, healthy life. Some of the health benefits of exercise include controlled diabetes, healthier blood pressure levels, improved cholesterol levels, improved heart and lung capacity, improved sleep, and better body composition. Everyone should speak with their doctor before starting or changing an exercise routine.  Biomechanical Limitations Clinical staff conducted group or individual video education with verbal and written material and guidebook.  Patient learns how biomechanical limitations can impact exercise and how we can mitigate and possibly overcome limitations to have an impactful and balanced exercise routine.  Body Composition Clinical staff conducted group or individual video education with verbal and written material and guidebook.  Patient learns that body composition (ratio of muscle mass to fat mass) is a key component to assessing overall fitness, rather than body weight alone. Increased fat mass, especially visceral belly fat, can put [place] at increased risk for metabolic syndrome,  type 2 diabetes, heart disease, and even death. It is recommended to combine diet and exercise (cardiovascular and resistance training) to improve your body composition. Seek guidance from your physician and exercise physiologist before implementing an exercise routine.  Exercise Action Plan Clinical staff conducted group or individual video education with verbal and written material and guidebook.  Patient learns the recommended strategies to achieve and enjoy long-term exercise adherence, including variety, self-motivation, self-efficacy, and positive decision making. Benefits of exercise include fitness, good health, weight management, more energy, better sleep, less stress, and overall well-being.  Medical   Heart Disease Risk Reduction Clinical staff conducted group or individual video education with verbal and written material and guidebook.  Patient learns our heart is our most vital organ as it circulates oxygen, nutrients, white blood cells, and hormones throughout the entire body, and carries waste away. Data supports a plant-based eating plan like the Pritikin Program for its effectiveness in slowing progression of and reversing  heart disease. The video provides a number of recommendations to address heart disease.   Metabolic Syndrome and Belly Fat  Clinical staff conducted group or individual video education with verbal and written material and guidebook.  Patient learns what metabolic syndrome is, how it leads to heart disease, and how one can reverse it and keep it from coming back. You have metabolic syndrome if you have 3 of the following 5 criteria: abdominal obesity, high blood pressure, high triglycerides, low HDL cholesterol, and high blood sugar.  Hypertension and Heart Disease Clinical staff conducted group or individual video education with verbal and written material and guidebook.  Patient learns that high blood pressure, or hypertension, is very common in the [place]. Hypertension is largely due to excessive salt intake, but other important risk factors include being overweight, physical inactivity, drinking too much alcohol, smoking, and not eating enough potassium from fruits and vegetables. High blood pressure is a leading risk factor for heart attack, stroke, congestive heart failure, dementia, kidney failure, and premature death. Long-term effects of excessive salt intake include stiffening of the arteries and thickening of heart muscle and organ damage. Recommendations include ways to reduce hypertension and the risk of heart disease.  Diseases of Our Time - Focusing on Diabetes Clinical staff conducted group or individual video education with verbal and written material and guidebook.  Patient learns why the best way to stop diseases of our time is prevention, through food and other lifestyle changes. Medicine (such as prescription pills and surgeries) is often only a Band-Aid on the problem, not a long-term solution. Most common diseases of our time include obesity, type 2 diabetes, hypertension, heart disease, and cancer. The Pritikin Program is recommended and has been proven to help reduce, reverse, and/or prevent the damaging effects of metabolic syndrome.  Nutrition   Overview of the Pritikin Eating Plan  Clinical staff conducted group or individual video education with verbal and written material and guidebook.  Patient learns about the [place] for disease risk reduction. The [place] emphasizes a wide variety of unrefined, minimally-processed carbohydrates, like fruits, vegetables, whole grains, and legumes. Go, Caution, and Stop food choices are explained. Plant-based and lean animal proteins are emphasized. Rationale provided for low sodium intake for blood pressure control, low added sugars for blood sugar stabilization, and low added fats and oils for coronary artery disease risk reduction and weight  management.  Calorie Density  Clinical staff conducted group or individual video education with verbal and written material and guidebook.  Patient learns about calorie density and how it impacts the Pritikin Eating Plan. Knowing the characteristics of the food you choose will help you decide whether those foods will lead to weight gain or weight loss, and whether you want to consume more or less of them. Weight loss is usually a side effect of the Pritikin Eating Plan because of its focus on low calorie-dense foods.  Label Reading  Clinical staff conducted group or individual video education with verbal and written material and guidebook.  Patient learns about the Pritikin recommended label reading guidelines and corresponding recommendations regarding calorie density, added sugars, sodium content, and whole grains.  Dining Out - Part 1  Clinical staff conducted group or individual video education with verbal and written material and guidebook.  Patient learns that restaurant meals can be sabotaging because they can be so high in calories, fat, sodium, and/or sugar. Patient learns recommended strategies on how to positively address this and avoid unhealthy  pitfalls.  Facts on Fats  Clinical staff conducted group or individual video education with verbal and written material and guidebook.  Patient learns that lifestyle modifications can be just as effective, if not more so, as many medications for lowering your risk of heart disease. A Pritikin lifestyle can help to reduce your risk of inflammation and atherosclerosis (cholesterol build-up, or plaque, in the artery walls). Lifestyle interventions such as dietary choices and physical activity address the cause of atherosclerosis. A review of the types of fats and their impact on blood cholesterol levels, along with dietary recommendations to reduce fat intake is also included.  Nutrition Action Plan  Clinical staff conducted group or individual  video education with verbal and written material and guidebook.  Patient learns how to incorporate Pritikin recommendations into their lifestyle. Recommendations include planning and keeping personal health goals in mind as an important part of their success.  Healthy Mind-Set    Healthy Minds, Bodies, Hearts  Clinical staff conducted group or individual video education with verbal and written material and guidebook.  Patient learns how to identify when they are stressed. Video will discuss the impact of that stress, as well as the many benefits of stress management. Patient will also be introduced to stress management techniques. The way we think, act, and feel has an impact on our hearts.  How Our Thoughts Can Heal Our Hearts  Clinical staff conducted group or individual video education with verbal and written material and guidebook.  Patient learns that negative thoughts can cause depression and anxiety. This can result in negative lifestyle behavior and serious health problems. Cognitive behavioral therapy is an effective method to help control our thoughts in order to change and improve our emotional outlook.  Additional Videos:  Exercise    Improving Performance  Clinical staff conducted group or individual video education with verbal and written material and guidebook.  Patient learns to use a non-linear approach by alternating intensity levels and lengths of time spent exercising to help burn more calories and lose more body fat. Cardiovascular exercise helps improve heart health, metabolism, hormonal balance, blood sugar control, and recovery from fatigue. Resistance training improves strength, endurance, balance, coordination, reaction time, metabolism, and muscle mass. Flexibility exercise improves circulation, posture, and balance. Seek guidance from your physician and exercise physiologist before implementing an exercise routine and learn your capabilities and proper form for all  exercise.  Introduction to Yoga  Clinical staff conducted group or individual video education with verbal and written material and guidebook.  Patient learns about yoga, a discipline of the coming together of mind, breath, and body. The benefits of yoga include improved flexibility, improved range of motion, better posture and core strength, increased lung function, weight loss, and positive self-image. Yoga's heart health benefits include lowered blood pressure, healthier heart rate, decreased cholesterol and triglyceride levels, improved immune function, and reduced stress. Seek guidance from your physician and exercise physiologist before implementing an exercise routine and learn your capabilities and proper form for all exercise.  Medical   Aging: Enhancing Your Quality of Life  Clinical staff conducted group or individual video education with verbal and written material and guidebook.  Patient learns key strategies and recommendations to stay in good physical health and enhance quality of life, such as prevention strategies, having an advocate, securing a [place], and keeping a list of medications and system for tracking them. It also discusses how to avoid risk for bone loss.  Biology of  Weight Control  Clinical staff conducted group or individual video education with verbal and written material and guidebook.  Patient learns that weight gain occurs because we consume more calories than we burn (eating more, moving less). Even if your body weight is normal, you may have higher ratios of fat compared to muscle mass. Too much body fat puts you at increased risk for cardiovascular disease, heart attack, stroke, type 2 diabetes, and obesity-related cancers. In addition to exercise, following the [place] can help reduce your risk.  Decoding Lab Results  Clinical staff conducted group or individual video education with verbal and written material and  guidebook.  Patient learns that lab test reflects one measurement whose values change over time and are influenced by many factors, including medication, stress, sleep, exercise, food, hydration, pre-existing medical conditions, and more. It is recommended to use the knowledge from this video to become more involved with your lab results and evaluate your numbers to speak with your doctor.   Diseases of Our Time - Overview  Clinical staff conducted group or individual video education with verbal and written material and guidebook.  Patient learns that according to the CDC, 50% to 70% of chronic diseases (such as obesity, type 2 diabetes, elevated lipids, hypertension, and heart disease) are avoidable through lifestyle improvements including healthier food choices, listening to satiety cues, and increased physical activity.  Sleep Disorders Clinical staff conducted group or individual video education with verbal and written material and guidebook.  Patient learns how good quality and duration of sleep are important to overall health and well-being. Patient also learns about sleep disorders and how they impact health along with recommendations to address them, including discussing with a physician.  Nutrition  Dining Out - Part 2 Clinical staff conducted group or individual video education with verbal and written material and guidebook.  Patient learns how to plan ahead and communicate in order to maximize their dining experience in a healthy and nutritious manner. Included are recommended food choices based on the type of restaurant the patient is visiting.   Fueling a Best boy conducted group or individual video education with verbal and written material and guidebook.  There is a strong connection between our food choices and our health. Diseases like obesity and type 2 diabetes are very prevalent and are in large-part due to lifestyle choices. The Pritikin Eating Plan  provides plenty of food and hunger-curbing satisfaction. It is easy to follow, affordable, and helps reduce health risks.  Menu Workshop  Clinical staff conducted group or individual video education with verbal and written material and guidebook.  Patient learns that restaurant meals can sabotage health goals because they are often packed with calories, fat, sodium, and sugar. Recommendations include strategies to plan ahead and to communicate with the manager, chef, or server to help order a healthier meal.  Planning Your Eating Strategy  Clinical staff conducted group or individual video education with verbal and written material and guidebook.  Patient learns about the [place] and its benefit of reducing the risk of disease. The [place] does not focus on calories. Instead, it emphasizes high-quality, nutrient-rich foods. By knowing the characteristics of the foods, we choose, we can determine their calorie density and make informed decisions.  Targeting Your Nutrition Priorities  Clinical staff conducted group or individual video education with verbal and written material and guidebook.  Patient learns that lifestyle habits have a tremendous impact on disease risk and progression. This  video provides eating and physical activity recommendations based on your personal health goals, such as reducing LDL cholesterol, losing weight, preventing or controlling type 2 diabetes, and reducing high blood pressure.  Vitamins and Minerals  Clinical staff conducted group or individual video education with verbal and written material and guidebook.  Patient learns different ways to obtain key vitamins and minerals, including through a recommended healthy diet. It is important to discuss all supplements you take with your doctor.   Healthy Mind-Set    Smoking Cessation  Clinical staff conducted group or individual video education with verbal and written material and guidebook.   Patient learns that cigarette smoking and tobacco addiction pose a serious health risk which affects millions of people. Stopping smoking will significantly reduce the risk of heart disease, lung disease, and many forms of cancer. Recommended strategies for quitting are covered, including working with your doctor to develop a successful plan.  Culinary   Becoming a Financial trader conducted group or individual video education with verbal and written material and guidebook.  Patient learns that cooking at home can be healthy, cost-effective, quick, and puts them in control. Keys to cooking healthy recipes will include looking at your recipe, assessing your equipment needs, planning ahead, making it simple, choosing cost-effective seasonal ingredients, and limiting the use of added fats, salts, and sugars.  Cooking - Breakfast and Snacks  Clinical staff conducted group or individual video education with verbal and written material and guidebook.  Patient learns how important breakfast is to satiety and nutrition through the entire day. Recommendations include key foods to eat during breakfast to help stabilize blood sugar levels and to prevent overeating at meals later in the day. Planning ahead is also a key component.  Cooking - Human resources officer conducted group or individual video education with verbal and written material and guidebook.  Patient learns eating strategies to improve overall health, including an approach to cook more at home. Recommendations include thinking of animal protein as a side on your plate rather than center stage and focusing instead on lower calorie dense options like vegetables, fruits, whole grains, and plant-based proteins, such as beans. Making sauces in large quantities to freeze for later and leaving the skin on your vegetables are also recommended to maximize your experience.  Cooking - Healthy Salads and Dressing Clinical staff  conducted group or individual video education with verbal and written material and guidebook.  Patient learns that vegetables, fruits, whole grains, and legumes are the foundations of the [place]. Recommendations include how to incorporate each of these in flavorful and healthy salads, and how to create homemade salad dressings. Proper handling of ingredients is also covered. Cooking - Soups and [employer] - Soups and Desserts Clinical staff conducted group or individual video education with verbal and written material and guidebook.  Patient learns that Pritikin soups and desserts make for easy, nutritious, and delicious snacks and meal components that are low in sodium, fat, sugar, and calorie density, while high in vitamins, minerals, and filling fiber. Recommendations include simple and healthy ideas for soups and desserts.   Overview     The Pritikin Solution Program Overview Clinical staff conducted group or individual video education with verbal and written material and guidebook.  Patient learns that the results of the [place] Program have been documented in more than 100 articles published in peer-reviewed journals, and the benefits include reducing risk factors for (and, in some cases, even  reversing) high cholesterol, high blood pressure, type 2 diabetes, obesity, and more! An overview of the three key pillars of the Pritikin Program will be covered: eating well, doing regular exercise, and having a healthy mind-set.  WORKSHOPS  Exercise: Exercise Basics: Building Your Action Plan Clinical staff led group instruction and group discussion with PowerPoint presentation and patient guidebook. To enhance the learning environment the use of posters, models and videos may be added. At the conclusion of this workshop, patients will comprehend the difference between physical activity and exercise, as well as the benefits of incorporating both, into their routine. Patients will  understand the FITT (Frequency, Intensity, Time, and Type) principle and how to use it to build an exercise action plan. In addition, safety concerns and other considerations for exercise and cardiac rehab will be addressed by the presenter. The purpose of this lesson is to promote a comprehensive and effective weekly exercise routine in order to improve patients' overall level of fitness.   Managing Heart Disease: Your Path to a Healthier Heart Clinical staff led group instruction and group discussion with PowerPoint presentation and patient guidebook. To enhance the learning environment the use of posters, models and videos may be added.At the conclusion of this workshop, patients will understand the anatomy and physiology of the heart. Additionally, they will understand how Pritikin's three pillars impact the risk factors, the progression, and the management of heart disease.  The purpose of this lesson is to provide a high-level overview of the heart, heart disease, and how the Pritikin lifestyle positively impacts risk factors.  Exercise Biomechanics Clinical staff led group instruction and group discussion with PowerPoint presentation and patient guidebook. To enhance the learning environment the use of posters, models and videos may be added. Patients will learn how the structural parts of their bodies function and how these functions impact their daily activities, movement, and exercise. Patients will learn how to promote a neutral spine, learn how to manage pain, and identify ways to improve their physical movement in order to promote healthy living. The purpose of this lesson is to expose patients to common physical limitations that impact physical activity. Participants will learn practical ways to adapt and manage aches and pains, and to minimize their effect on regular exercise. Patients will learn how to maintain good posture while sitting, walking, and lifting.  Balance Training  and Fall Prevention  Clinical staff led group instruction and group discussion with PowerPoint presentation and patient guidebook. To enhance the learning environment the use of posters, models and videos may be added. At the conclusion of this workshop, patients will understand the importance of their sensorimotor skills (vision, proprioception, and the vestibular system) in maintaining their ability to balance as they age. Patients will apply a variety of balancing exercises that are appropriate for their current level of function. Patients will understand the common causes for poor balance, possible solutions to these problems, and ways to modify their physical environment in order to minimize their fall risk. The purpose of this lesson is to teach patients about the importance of maintaining balance as they age and ways to minimize their risk of falling.  WORKSHOPS   Nutrition:  Fueling a Scientist, research (physical sciences) led group instruction and group discussion with PowerPoint presentation and patient guidebook. To enhance the learning environment the use of posters, models and videos may be added. Patients will review the foundational principles of the [place] and understand what constitutes a serving size in each of the food  groups. Patients will also learn Pritikin-friendly foods that are better choices when away from home and review make-ahead meal and snack options. Calorie density will be reviewed and applied to three nutrition priorities: weight maintenance, weight loss, and weight gain. The purpose of this lesson is to reinforce (in a group setting) the key concepts around what patients are recommended to eat and how to apply these guidelines when away from home by planning and selecting Pritikin-friendly options. Patients will understand how calorie density may be adjusted for different weight management goals.  Mindful Eating  Clinical staff led group instruction and group  discussion with PowerPoint presentation and patient guidebook. To enhance the learning environment the use of posters, models and videos may be added. Patients will briefly review the concepts of the [place] and the importance of low-calorie dense foods. The concept of mindful eating will be introduced as well as the importance of paying attention to internal hunger signals. Triggers for non-hunger eating and techniques for dealing with triggers will be explored. The purpose of this lesson is to provide patients with the opportunity to review the basic principles of the [place], discuss the value of eating mindfully and how to measure internal cues of hunger and fullness using the Hunger Scale. Patients will also discuss reasons for non-hunger eating and learn strategies to use for controlling emotional eating.  Targeting Your Nutrition Priorities Clinical staff led group instruction and group discussion with PowerPoint presentation and patient guidebook. To enhance the learning environment the use of posters, models and videos may be added. Patients will learn how to determine their genetic susceptibility to disease by reviewing their family history. Patients will gain insight into the importance of diet as part of an overall healthy lifestyle in mitigating the impact of genetics and other environmental insults. The purpose of this lesson is to provide patients with the opportunity to assess their personal nutrition priorities by looking at their family history, their own health history and current risk factors. Patients will also be able to discuss ways of prioritizing and modifying the [place] for their highest risk areas  Menu  Clinical staff led group instruction and group discussion with PowerPoint presentation and patient guidebook. To enhance the learning environment the use of posters, models and videos may be added. Using menus brought in from [employer],  or printed from [employer], patients will apply the [place] dining out guidelines that were presented in the [employer] video. Patients will also be able to practice these guidelines in a variety of provided scenarios. The purpose of this lesson is to provide patients with the opportunity to practice hands-on learning of the [place] with actual menus and practice scenarios.  Label Reading Clinical staff led group instruction and group discussion with PowerPoint presentation and patient guidebook. To enhance the learning environment the use of posters, models and videos may be added. Patients will review and discuss the [facility] label reading guidelines presented in [facility]'s Label Reading Educational series video. Using fool labels brought in from local grocery stores and markets, patients will apply the label reading guidelines and determine if the packaged food meet the [facility] guidelines. The purpose of this lesson is to provide patients with the opportunity to review, discuss, and practice hands-on learning of the [facility] Label Reading guidelines with actual packaged food labels. [place] Workshops are designed to teach patients ways to prepare quick, simple, and affordable recipes at  home. The importance of nutrition's role in chronic disease risk reduction is reflected in its emphasis in the overall [facility] program. By learning how to prepare essential core [facility] Eating Plan recipes, patients will increase control over what they eat; be able to customize the flavor of foods without the use of added salt, sugar, or fat; and improve the quality of the food they consume. By learning a set of core recipes which are easily assembled, quickly prepared, and affordable, patients are more likely to prepare more healthy foods at home. These workshops focus on convenient breakfasts, simple entres, side dishes, and desserts which  can be prepared with minimal effort and are consistent with nutrition recommendations for cardiovascular risk reduction. Cooking [employer] are taught by a Engineer, materials (RD) who has been trained by the [employer]. The chef or RD has a clear understanding of the importance of minimizing - if not completely eliminating - added fat, sugar, and sodium in recipes. Throughout the series of [place] Workshop sessions, patients will learn about healthy ingredients and efficient methods of cooking to build confidence in their capability to prepare    Cooking School weekly topics:  Adding Flavor- Sodium-Free  Fast and Healthy Breakfasts  Powerhouse Plant-Based Proteins  Satisfying Salads and Dressings  Simple Sides and Sauces  International Cuisine-Spotlight on the [place] Zones  Delicious Desserts  Savory Soups  Efficiency Cooking - Meals in a Snap  Tasty Appetizers and Snacks  Comforting Weekend Breakfasts  One-Pot Wonders   Fast Evening Meals  Easy [place] (Psychosocial): New Thoughts, New Behaviors Clinical staff led group instruction and group discussion with PowerPoint presentation and patient guidebook. To enhance the learning environment the use of posters, models and videos may be added. Patients will learn and practice techniques for developing effective health and lifestyle goals. Patients will be able to effectively apply the goal setting process learned to develop at least one new personal goal.  The purpose of this lesson is to expose patients to a new skill set of behavior modification techniques such as techniques setting SMART goals, overcoming barriers, and achieving new thoughts and new behaviors.  Managing Moods and Relationships Clinical staff led group instruction and group discussion with PowerPoint presentation and patient guidebook. To enhance the learning  environment the use of posters, models and videos may be added. Patients will learn how emotional and chronic stress factors can impact their health and relationships. They will learn healthy ways to manage their moods and utilize positive coping mechanisms. In addition, ICR patients will learn ways to improve communication skills. The purpose of this lesson is to expose patients to ways of understanding how one's mood and health are intimately connected. Developing a healthy outlook can help build positive relationships and connections with others. Patients will understand the importance of utilizing effective communication skills that include actively listening and being heard. They will learn and understand the importance of the "4 Cs" and especially Connections in fostering of a Healthy Mind-Set.  Healthy Sleep for a Healthy Heart Clinical staff led group instruction and group discussion with PowerPoint presentation and patient guidebook. To enhance the learning environment the use of posters, models and videos may be added. At the conclusion of this workshop, patients will be able to demonstrate knowledge of the importance of sleep to overall health, well-being, and quality of life. They will understand the symptoms of, and treatments for, common sleep disorders.  Patients will also be able to identify daytime and nighttime behaviors which impact sleep, and they will be able to apply these tools to help manage sleep-related challenges. The purpose of this lesson is to provide patients with a general overview of sleep and outline the importance of quality sleep. Patients will learn about a few of the most common sleep disorders. Patients will also be introduced to the concept of "sleep hygiene," and discover ways to self-manage certain sleeping problems through simple daily behavior changes. Finally, the workshop will motivate patients by clarifying the links between quality sleep and their goals of  heart-healthy living.   Recognizing and Reducing Stress Clinical staff led group instruction and group discussion with PowerPoint presentation and patient guidebook. To enhance the learning environment the use of posters, models and videos may be added. At the conclusion of this workshop, patients will be able to understand the types of stress reactions, differentiate between acute and chronic stress, and recognize the impact that chronic stress has on their health. They will also be able to apply different coping mechanisms, such as reframing negative self-talk. Patients will have the opportunity to practice a variety of stress management techniques, such as deep abdominal breathing, progressive muscle relaxation, and/or guided imagery.  The purpose of this lesson is to educate patients on the role of stress in their lives and to provide healthy techniques for coping with it.  Learning Barriers/Preferences:  Learning Barriers/Preferences - 07/18/22 1503       Learning Barriers/Preferences   Learning Barriers Sight;Exercise Concerns    Learning Preferences Audio;Computer/Internet;Group Instruction;Individual Instruction;Pictoral;Skilled Demonstration;Verbal Instruction;Video;Written Material             Education Topics:  Knowledge Questionnaire Score:  Knowledge Questionnaire Score - 07/18/22 1503       Knowledge Questionnaire Score   Pre Score 20/24             Core Components/Risk Factors/Patient Goals at Admission:  Personal Goals and Risk Factors at Admission - 07/18/22 1506       Core Components/Risk Factors/Patient Goals on Admission    Weight Management Yes;Weight Maintenance    Intervention Weight Management: Develop a combined nutrition and exercise program designed to reach desired caloric intake, while maintaining appropriate intake of nutrient and fiber, sodium and fats, and appropriate energy expenditure required for the weight goal.;Weight Management: Provide  education and appropriate resources to help participant work on and attain dietary goals.    Expected Outcomes Short Term: Continue to assess and modify interventions until short term weight is achieved;Long Term: Adherence to nutrition and physical activity/exercise program aimed toward attainment of established weight goal;Weight Loss: Understanding of general recommendations for a balanced deficit meal plan, which promotes 1-2 lb weight loss per week and includes a negative energy balance of [phone] kcal/d;Weight Maintenance: Understanding of the daily nutrition guidelines, which includes 25-35% calories from fat, 7% or less cal from saturated fats, less than 256m cholesterol, less than 1.5gm of sodium, & 5 or more servings of fruits and vegetables daily;Understanding recommendations for meals to include 15-35% energy as protein, 25-35% energy from fat, 35-60% energy from carbohydrates, less than 2063mof dietary cholesterol, 20-35 gm of total fiber daily;Understanding of distribution of calorie intake throughout the day with the consumption of 4-5 meals/snacks    Tobacco Cessation Yes    Number of packs per day 1/2 PPD    Intervention Assist the participant in steps to quit. Provide individualized education and counseling about committing to Tobacco Cessation, relapse prevention,  and pharmacological support that can be provided by physician.;Advice worker, assist with locating and accessing local/national Quit Smoking programs, and support quit date choice.    Expected Outcomes Short Term: Will demonstrate readiness to quit, by selecting a quit date.;Short Term: Will quit all tobacco product use, adhering to prevention of relapse plan.;Long Term: Complete abstinence from all tobacco products for at least 12 months from quit date.    Heart Failure Yes    Intervention Provide a combined exercise and nutrition program that is supplemented with education, support and counseling about heart  failure. Directed toward relieving symptoms such as shortness of breath, decreased exercise tolerance, and extremity edema.    Expected Outcomes Improve functional capacity of life;Short term: Attendance in program 2-3 days a week with increased exercise capacity. Reported lower sodium intake. Reported increased fruit and vegetable intake. Reports medication compliance.;Short term: Daily weights obtained and reported for increase. Utilizing diuretic protocols set by physician.;Long term: Adoption of self-care skills and reduction of barriers for early signs and symptoms recognition and intervention leading to self-care maintenance.    Hypertension Yes    Intervention Provide education on lifestyle modifcations including regular physical activity/exercise, weight management, moderate sodium restriction and increased consumption of fresh fruit, vegetables, and low fat dairy, alcohol moderation, and smoking cessation.;Monitor prescription use compliance.    Expected Outcomes Short Term: Continued assessment and intervention until BP is < 140/19m HG in hypertensive participants. < 130/878mHG in hypertensive participants with diabetes, heart failure or chronic kidney disease.;Long Term: Maintenance of blood pressure at goal levels.    Lipids Yes    Intervention Provide education and support for participant on nutrition & aerobic/resistive exercise along with prescribed medications to achieve LDL <7066mHDL >48m11m  Expected Outcomes Short Term: Participant states understanding of desired cholesterol values and is compliant with medications prescribed. Participant is following exercise prescription and nutrition guidelines.;Long Term: Cholesterol controlled with medications as prescribed, with individualized exercise RX and with personalized nutrition plan. Value goals: LDL < 70mg23mL > 40 mg.    Stress Yes    Intervention Offer individual and/or small group education and counseling on adjustment to heart  disease, stress management and health-related lifestyle change. Teach and support self-help strategies.;Refer participants experiencing significant psychosocial distress to appropriate mental health specialists for further evaluation and treatment. When possible, include family members and significant others in education/counseling sessions.    Expected Outcomes Short Term: Participant demonstrates changes in health-related behavior, relaxation and other stress management skills, ability to obtain effective social support, and compliance with psychotropic medications if prescribed.;Long Term: Emotional wellbeing is indicated by absence of clinically significant psychosocial distress or social isolation.    Personal Goal Other Yes    Personal Goal Short: ADL's easier Long: stamina, get back to normal life    Intervention Will continue to monitor pt and progress workloads as tolerated without sign or symptom    Expected Outcomes Pt will achieve his goals and gain strength             Core Components/Risk Factors/Patient Goals Review:   Goals and Risk Factor Review     Row Name 07/22/22 1600 08/06/22 1543           Core Components/Risk Factors/Patient Goals Review   Personal Goals Review Weight Management/Obesity;Heart Failure;Stress;Lipids;Hypertension;Tobacco Cessation Weight Management/Obesity;Heart Failure;Stress;Lipids;Hypertension;Tobacco Cessation      Review Christopher Christopher [place] intensive cardiac rehab on 07/22/22 and did well with exercise. Vital signs were stable Christopher Christopher [place] to a  good start to exercise at intensive cardiac rehab . Vital signs were stable. Continues to smoke has no intention of quitting currently.      Expected Outcomes Christopher Gutierrez will continue to participate in intensive cardiac rehab foe exercise, nutrition and lifestyle modifications. Will continue to encourage smoking cessation Christopher Gutierrez will continue to participate in intensive cardiac rehab foe exercise, nutrition and lifestyle  modifications.               Core Components/Risk Factors/Patient Goals at Discharge (Final Review):   Goals and Risk Factor Review - 08/06/22 1543       Core Components/Risk Factors/Patient Goals Review   Personal Goals Review Weight Management/Obesity;Heart Failure;Stress;Lipids;Hypertension;Tobacco Cessation    Review Christopher Gutierrez is off to a good start to exercise at intensive cardiac rehab . Vital signs were stable. Continues to smoke has no intention of quitting currently.    Expected Outcomes Christopher Gutierrez will continue to participate in intensive cardiac rehab foe exercise, nutrition and lifestyle modifications.             ITP Comments:  ITP Comments     Row Name 07/18/22 1548 07/22/22 1539 08/06/22 1541       ITP Comments Dr Fransico Him MD, Medical [employer], Introduction to [facility] Education Program/ Intensive Cardiac rehab. Initial Orientation packet Reviewed with the patient. 30 Day ITP Review. Christopher Gutierrez started intensive cardiac rehab on 07/22/22 and did well with exercise 30 Day ITP Review. Christopher Gutierrez has good attendance and participation in intensive cardiac rehab on 07/22/22 and is off to a good start to  exercise              Comments: See ITP comments.Christopher Gave RN BSN

## 2022-08-07 ENCOUNTER — Telehealth (HOSPITAL_COMMUNITY): Payer: Self-pay | Admitting: *Deleted

## 2022-08-07 ENCOUNTER — Encounter (HOSPITAL_COMMUNITY)
Admission: RE | Admit: 2022-08-07 | Discharge: 2022-08-07 | Disposition: A | Discharge status: Home / Self Care | Payer: Commercial Managed Care - PPO | Source: Ambulatory Visit | Attending: Cardiology | Admitting: Cardiology

## 2022-08-07 DIAGNOSIS — Z9889 Other specified postprocedural states: Secondary | ICD-10-CM | POA: Diagnosis not present

## 2022-08-07 DIAGNOSIS — Z48812 Encounter for surgical aftercare following surgery on the circulatory system: Secondary | ICD-10-CM | POA: Insufficient documentation

## 2022-08-07 DIAGNOSIS — D6869 Other thrombophilia: Secondary | ICD-10-CM | POA: Insufficient documentation

## 2022-08-07 DIAGNOSIS — I2510 Atherosclerotic heart disease of native coronary artery without angina pectoris: Secondary | ICD-10-CM | POA: Insufficient documentation

## 2022-08-07 DIAGNOSIS — I5022 Chronic systolic (congestive) heart failure: Secondary | ICD-10-CM | POA: Insufficient documentation

## 2022-08-07 DIAGNOSIS — I110 Hypertensive heart disease with heart failure: Secondary | ICD-10-CM | POA: Diagnosis not present

## 2022-08-07 DIAGNOSIS — I4819 Other persistent atrial fibrillation: Secondary | ICD-10-CM | POA: Insufficient documentation

## 2022-08-07 DIAGNOSIS — Z955 Presence of coronary angioplasty implant and graft: Secondary | ICD-10-CM

## 2022-08-07 NOTE — Telephone Encounter (Signed)
-----   Message from Conception Chancy, PsyD sent at 08/07/2022 10:02 AM EST ----- Lavera Guise,  I hope this finds you well. Thanks for sharing the information, I appreciate it. I will address some of the concerns. I have also encouraged him to reach out to social work regarding some of the financial concerns. If I can do anything else, please let me know. Have a great day.  Best, Matt ----- Message ----- From: Magda Kiel, RN Sent: 07/24/2022   4:57 PM EST To: Conception Chancy, PsyD  Hello Matt Mr Sontag started cardiac rehab his QOL scores were low. I am forwarding my note for your review. He says he will see you next month and that you are aware of the issues he has at home.

## 2022-08-09 ENCOUNTER — Encounter (HOSPITAL_COMMUNITY)
Admission: RE | Admit: 2022-08-09 | Discharge: 2022-08-09 | Disposition: A | Discharge status: Home / Self Care | Payer: Commercial Managed Care - PPO | Source: Ambulatory Visit | Attending: Cardiology | Admitting: Cardiology

## 2022-08-09 DIAGNOSIS — I5022 Chronic systolic (congestive) heart failure: Secondary | ICD-10-CM

## 2022-08-09 DIAGNOSIS — Z955 Presence of coronary angioplasty implant and graft: Secondary | ICD-10-CM | POA: Diagnosis not present

## 2022-08-12 ENCOUNTER — Encounter (HOSPITAL_COMMUNITY): Payer: Commercial Managed Care - PPO

## 2022-08-14 ENCOUNTER — Encounter (HOSPITAL_COMMUNITY): Payer: Commercial Managed Care - PPO

## 2022-08-16 ENCOUNTER — Encounter (HOSPITAL_COMMUNITY): Payer: Commercial Managed Care - PPO

## 2022-08-16 ENCOUNTER — Other Ambulatory Visit: Payer: Self-pay | Admitting: Cardiovascular Disease

## 2022-08-16 NOTE — Telephone Encounter (Signed)
Refill Request.

## 2022-08-19 ENCOUNTER — Telehealth (HOSPITAL_COMMUNITY): Payer: Self-pay | Admitting: *Deleted

## 2022-08-19 ENCOUNTER — Encounter (HOSPITAL_COMMUNITY): Payer: Commercial Managed Care - PPO

## 2022-08-19 NOTE — Telephone Encounter (Signed)
Left message to call cardiac rehab.Christopher Gutierrez [name] RN BSN  

## 2022-08-20 ENCOUNTER — Encounter (INDEPENDENT_AMBULATORY_CARE_PROVIDER_SITE_OTHER): Payer: Commercial Managed Care - PPO | Admitting: Cardiology

## 2022-08-20 ENCOUNTER — Encounter: Payer: Self-pay | Admitting: Cardiology

## 2022-08-20 VITALS — BP 126/80 | HR 58 | Ht 74.0 in | Wt 212.0 lb

## 2022-08-20 DIAGNOSIS — I4819 Other persistent atrial fibrillation: Secondary | ICD-10-CM

## 2022-08-20 DIAGNOSIS — D6869 Other thrombophilia: Secondary | ICD-10-CM | POA: Diagnosis not present

## 2022-08-20 DIAGNOSIS — I5022 Chronic systolic (congestive) heart failure: Secondary | ICD-10-CM | POA: Diagnosis not present

## 2022-08-20 DIAGNOSIS — I2510 Atherosclerotic heart disease of native coronary artery without angina pectoris: Secondary | ICD-10-CM

## 2022-08-20 NOTE — Addendum Note (Signed)
Addended by: Stanton Kidney on: 08/20/2022 03:04 PM   Modules accepted: Orders

## 2022-08-20 NOTE — Progress Notes (Signed)
Electrophysiology Office Note   Date:  08/20/2022   ID:  [name], Christopher Gutierrez, MRN [id number]  PCP:  Christopher Nutting, DO  Cardiologist:  [facility] [place] Primary Electrophysiologist:  Christopher Astorino Meredith Leeds, MD    Chief Complaint: AF   History of Present Illness: Christopher Gutierrez is a 62 y.o. male who is being seen today for the evaluation of AF at the request of Christopher Nutting, DO. Presenting today for electrophysiology evaluation.  The history significant hypertension, tobacco abuse, alcohol abuse.  He presented to cardiology clinic with atrial fibrillation.  He was found to be volume overloaded and was diuresed.  He had an echo showed an ejection fraction 20 to 25%.  He had a TEE cardioversion 11/01/2020.  He had improvement in his ejection fraction with maintenance of sinus rhythm.  Left heart catheterization showed RCA disease and he had a stent placed.  He is now status post ablation ablation 05/16/2022.  Today, denies symptoms of palpitations, chest Gutierrez, shortness of breath, orthopnea, PND, lower extremity edema, claudication, dizziness, presyncope, syncope, bleeding, or neurologic sequela. The patient is tolerating medications without difficulties.  Since his ablation he has done well.  He did have 1 episode of atrial fibrillation.  He presented to the emergency room and had a cardioversion.  He was loaded on amiodarone.  Since then, he has done well.  He has noted no further episodes of atrial fibrillation.  Happy with his control.   Past Medical History:  Diagnosis Date   Atrial fibrillation ([place]) 10/24/2020   Atrial fibrillation and flutter ([facility])    CHF (congestive heart failure) ([place])    Coronary artery disease    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   Hyperlipidemia    Hypertension    Past Surgical History:  Procedure Laterality Date   ABDOMINAL AORTOGRAM W/LOWER EXTREMITY N/A 03/07/2021   Procedure: ABDOMINAL AORTOGRAM W/LOWER  EXTREMITY;  Surgeon: Christopher Hampshire, MD;  Location: [place] [facility];  Service: Cardiovascular;  Laterality: N/A;   ATRIAL FIBRILLATION ABLATION N/A 05/16/2022   Procedure: ATRIAL FIBRILLATION ABLATION;  Surgeon: Christopher Haw, MD;  Location: [place] [facility];  Service: Cardiovascular;  Laterality: N/A;   CARDIAC CATHETERIZATION     CARDIOVERSION N/A 11/01/2020   Procedure: CARDIOVERSION;  Surgeon: Christopher Pain, MD;  Location: [place] [facility];  Service: Cardiovascular;  Laterality: N/A;   CATARACT EXTRACTION Right    CORONARY STENT INTERVENTION N/A 12/24/2021   Procedure: CORONARY STENT INTERVENTION;  Surgeon: Christopher Bush, MD;  Location: [place] [facility];  Service: Cardiovascular;  Laterality: N/A;   LEFT HEART CATH AND CORONARY ANGIOGRAPHY N/A 12/24/2021   Procedure: LEFT HEART CATH AND CORONARY ANGIOGRAPHY;  Surgeon: Christopher Bush, MD;  Location: [place] [facility];  Service: Cardiovascular;  Laterality: N/A;   MOUTH SURGERY  2013   PERIPHERAL VASCULAR INTERVENTION Left 03/07/2021   Procedure: PERIPHERAL VASCULAR INTERVENTION;  Surgeon: Christopher Hampshire, MD;  Location: [place] [facility];  Service: Cardiovascular;  Laterality: Left;  left SFA   TEE WITHOUT CARDIOVERSION N/A 11/01/2020   Procedure: TRANSESOPHAGEAL ECHOCARDIOGRAM (TEE);  Surgeon: Christopher Pain, MD;  Location[facility];  Service: Cardiovascular;  Laterality: N/A;     Current Outpatient Medications  Medication Sig Dispense Refill   acetaminophen (TYLENOL) 500 MG tablet Take 500 mg by mouth every 6 (six) hours as needed for moderate Gutierrez.     amiodarone (PACERONE) 200 MG tablet Take 2 tablets (400 mg  total) by mouth 2 (two) times daily for 10 days, THEN 1 tablet (200 mg total) daily. 130 tablet 0   apixaban (ELIQUIS) 5 MG TABS tablet TAKE 1 TABLET BY MOUTH TWICE A DAY 180 tablet 2   bisoprolol (ZEBETA) 10 MG tablet Take 1 tablet (10 mg total) by mouth daily. 90 tablet 3   cetirizine (ZYRTEC) 10 MG  tablet Take 10 mg by mouth as needed for allergies.     clopidogrel (PLAVIX) 75 MG tablet TAKE 1 TABLET BY MOUTH EVERY DAY 90 tablet 3   dapagliflozin propanediol (FARXIGA) 10 MG TABS tablet Take 1 tablet (10 mg total) by mouth daily before breakfast. NEEDS FOLLOW UP APPOINTMENT FOR ANYMORE REFILLS 30 tablet 3   ENTRESTO 97-103 MG TAKE 1 TABLET BY MOUTH TWICE A DAY 60 tablet 6   ezetimibe (ZETIA) 10 MG tablet Take 1 tablet (10 mg total) by mouth daily. 90 tablet 3   famotidine-calcium carbonate-magnesium hydroxide (PEPCID COMPLETE) 10-800-165 MG chewable tablet Chew 1 tablet by mouth daily as needed (acid reflux).     hydrALAZINE (APRESOLINE) 25 MG tablet TAKE 1 TABLET (25 MG) BY MOUTH IN THE MORNING AND AT BEDTIME 180 tablet 0   Polyvinyl Alcohol-Povidone (REFRESH OP) Place 1 drop into both eyes daily as needed (dry eyes).     rosuvastatin (CRESTOR) 40 MG tablet Take 1 tablet (40 mg total) by mouth daily. Please keep scheduled appointment for future refills 90 tablet 0   SIMBRINZA 1-0.2 % SUSP Apply 1 drop to eye 2 (two) times daily.     spironolactone (ALDACTONE) 25 MG tablet Take 1 tablet (25 mg total) by mouth daily. 90 tablet 3   nitroGLYCERIN (NITROSTAT) 0.4 MG SL tablet Place 1 tablet (0.4 mg total) under the tongue every 5 (five) minutes as needed. 25 tablet 3   No current facility-administered medications for this visit.    Allergies:   Beta adrenergic blockers and Tape   Social History:  The patient  reports that he has been smoking cigarettes. He has a 37.50 pack-year smoking history. His smokeless tobacco use includes chew. He reports current alcohol use of about 24.0 standard drinks of alcohol per week. He reports that he does not currently use drugs after having used the following drugs: Marijuana. Frequency: 2.00 times per week.   Family History:  The patient's family history includes Birth defects in his paternal aunt; COPD in his maternal uncle; Gout in his brother; Hypertension  in his brother; Stroke in his paternal aunt.   ROS:  Please see the history of present illness.   Otherwise, review of systems is positive for none.   All other systems are reviewed and negative.   PHYSICAL EXAM: VS:  BP 126/80   Pulse (!) 58   Ht '6\' 2"'$  (1.88 m)   Wt 212 lb (96.2 kg)   SpO2 99%   BMI 27.22 kg/m  , BMI Body mass index is 27.22 kg/m. GEN: Well nourished, well developed, in no acute distress  HEENT: normal  Neck: no JVD, carotid bruits, or masses Cardiac: RRR; no murmurs, rubs, or gallops,no edema  Respiratory:  clear to auscultation bilaterally, normal work of breathing GI: soft, nontender, nondistended, + BS MS: no deformity or atrophy  Skin: warm and dry Neuro:  Strength and sensation are intact Psych: euthymic mood, full affect  EKG:  EKG is ordered today. Personal review of the ekg ordered shows sinus rhythm, rate 58  Recent Labs: 05/31/2022: B Natriuretic Peptide 621.5; Hemoglobin 18.5;  Magnesium 2.3; Platelets 238 08/01/2022: ALT 39; BUN 14; Creatinine, Ser 1.11; Potassium 4.4; Sodium 137; TSH 5.400    Lipid Panel     Component Value Date/Time   CHOL 143 04/25/2022 1543   TRIG 102 04/25/2022 1543   HDL 65 04/25/2022 1543   CHOLHDL 2.2 04/25/2022 1543   CHOLHDL 3.3 01/26/2021 1213   VLDL 21 01/26/2021 1213   LDLCALC 59 04/25/2022 1543     Wt Readings from Last 3 Encounters:  08/20/22 212 lb (96.2 kg)  07/23/22 215 lb (97.5 kg)  07/18/22 211 lb 6.7 oz (95.9 kg)      Other studies Reviewed: Additional studies/ records that were reviewed today include: LHC 12/24/21  Review of the above records today demonstrates:  Severe single-vessel coronary artery disease with 80-90% mid RCA stenosis.  There is mild-moderate, nonobstructive disease involving the left coronary artery. Low normal left ventricular systolic function (LVEF 57-84%) with upper normal filling pressure (LVEDP 15 mmHg). Successful PCI to mid RCA using Synergy 2.75 x 20 mm drug-eluting  stent (postdilated to 3.3 mm) with 0% residual stenosis and TIMI-3 flow.  TTE 01/26/21  1. Compared with echo 01/9628, systolic function has improved.   2. Left ventricular ejection fraction, by estimation, is 45 to 50%. The  left ventricle has mildly decreased function. The left ventricle  demonstrates global hypokinesis. There is moderate concentric left  ventricular hypertrophy. Left ventricular  diastolic parameters are consistent with Grade I diastolic dysfunction  (impaired relaxation).   3. Right ventricular systolic function is normal. The right ventricular  size is normal.   4. The mitral valve is normal in structure. Trivial mitral valve  regurgitation. No evidence of mitral stenosis.   5. The aortic valve is tricuspid. Aortic valve regurgitation is not  visualized. No aortic stenosis is present.   6. The inferior vena cava is normal in size with greater than 50%  respiratory variability, suggesting right atrial pressure of 3 mmHg.   ASSESSMENT AND PLAN:  1.  Persistent atrial fibrillation/flutter: CHA2DS2-VASc of 3.  Currently on Eliquis 5 mg twice daily, amiodarone 200 mg daily.  Status post ablation 05/16/2022.  Amiodarone was started after his ablation as he did require cardioversion.  He has had no further episodes of atrial fibrillation.  Shaun Zuccaro stop amiodarone today.  2.  Coronary artery disease: Status post RCA stent.  No current chest Gutierrez.  3.  Chronic systolic and diastolic heart failure: Currently on optimal medical therapy per primary cardiology.  4.  Peripheral arterial disease: Distal SFA occlusion.  Follows with vascular surgery.  Has stent of left SFA to popliteal artery.  5.  Secondary hypercoagulable state: Currently on Eliquis for atrial fibrillation as above   Current medicines are reviewed at length with the patient today.   The patient does not have concerns regarding his medicines.  The following changes were made today: Stop amiodarone  Labs/ tests  ordered today include:  Orders Placed This Encounter  Procedures   EKG 12-Lead     Disposition:   FU 6 months  Signed, Itzelle Gains Meredith Leeds, MD  08/20/2022 3:00 PM     [place] [address] [phone] (office) [phone] (fax)

## 2022-08-20 NOTE — Patient Instructions (Signed)
Medication Instructions:  Your physician has recommended you make the following change in your medication:  STOP Amiodarone  *If you need a refill on your cardiac medications before your next appointment, please call your pharmacy*   Lab Work: None ordered   Testing/Procedures: None ordered   Follow-Up: At [facility], you and your health needs are our priority.  As part of our continuing mission to provide you with exceptional heart care, we have created designated Provider Care Teams.  These Care Teams include your primary Cardiologist (physician) and Advanced Practice Providers (APPs -  Physician Assistants and Nurse Practitioners) who all work together to provide you with the care you need, when you need it.  Your next appointment:   6 month(s)  The format for your next appointment:   In Person  Provider:   You will follow up in the [facility] located at [facility]. Your provider will be: Clint R. Fenton, PA-C    Thank you for choosing [facility]!!   Trinidad Curet, RN [phone]  Other Instructions

## 2022-08-21 ENCOUNTER — Encounter (HOSPITAL_COMMUNITY)
Admission: RE | Admit: 2022-08-21 | Discharge: 2022-08-21 | Disposition: A | Discharge status: Home / Self Care | Payer: Commercial Managed Care - PPO | Source: Ambulatory Visit | Attending: Cardiology | Admitting: Cardiology

## 2022-08-21 DIAGNOSIS — Z955 Presence of coronary angioplasty implant and graft: Secondary | ICD-10-CM

## 2022-08-22 ENCOUNTER — Ambulatory Visit (INDEPENDENT_AMBULATORY_CARE_PROVIDER_SITE_OTHER): Payer: Commercial Managed Care - PPO | Admitting: Psychologist

## 2022-08-22 DIAGNOSIS — F411 Generalized anxiety disorder: Secondary | ICD-10-CM | POA: Diagnosis not present

## 2022-08-22 NOTE — Progress Notes (Signed)
[facility] Counselor/Therapist Progress Note  Patient ID: Christopher Gutierrez, MRN: [id number],    Date: 08/22/2022  Time Spent: 01:05 pm to 01:45 pm; total time: 40 minutes   This session was held via in person. The patient consented to in-person therapy and was in the clinician's office. Limits of confidentiality were discussed with the patient.   Treatment Type: Individual Therapy  Reported Symptoms: Depressed  Mental Status Exam: Appearance:  Well Groomed     Behavior: Appropriate  Motor: Normal  Speech/Language:  Slow  Affect: Flat  Mood: constricted  Thought process: normal  Thought content:   WNL  Sensory/Perceptual disturbances:   WNL  Orientation: oriented to person, place, and time/date  Attention: Good  Concentration: Good  Memory: WNL  Fund of knowledge:  Good  Insight:   Poor  Judgment:  Fair  Impulse Control: Good   Risk Assessment: Danger to Self:  No Self-injurious Behavior: No Danger to Others: No Duty to Warn:no Physical Aggression / Violence:No  Access to Firearms a concern: No  Gang Involvement:No   Subjective: Beginning the session, patient acknowledged that he has experienced several different events since our last encounter. He reflected on these events. From there, he indicated that he wants to work towards being more physically active. He spent the session reflecting on reasons to be more active and first steps he can take to be more active. He was agreeable to the homework and following up. He denied suicidal and homicidal ideation.    Interventions:  Worked on developing a therapeutic relationship with the patient using active listening and reflective statements. Provided emotional support using empathy and validation. Reviewed the treatment plan with the patient. Reviewed events since the last session. Praised the patient for having a good holiday season. Processed thoughts and emotions. Identified goals for the session. Used  socratic questions to assist the patient. Explored reasons to be more physically active. Processed barriers to being more active. Processed thoughts and emotions. Assisted in problem solving. Introduced the idea of SMART goals.  Assigned homework. Provided empathic statements.Assessed for suicidal and homicidal ideation.   Homework: Write on a note card reasons to be more physically active  Next Session: Review homework and emotional support. Develop SMART goal for being more physically active  Diagnosis: F41.1 generalized anxiety disorder  Plan:  Goals Work through the grieving process and face reality of own death Accept emotional support from others around them Live life to the fullest, event though time may be limited Become as knowledgeable about the medical condition  Reduce fear, anxiety about the health condition  Accept the illness Accept the role of psychological and behavioral factors  Stabilize anxiety level wile increasing ability to function Learn and implement coping skills that result in a reduction of anxiety  Alleviate depressive symptoms Recognize, accept, and cope with depressive feelings Develop healthy thinking patterns Develop healthy interpersonal relationships  Objectives target date for all objectives is 03/20/2023 Identify feelings associated with the illness Family members share with each other feelings Identify the losses or limitations that have been experienced Verbalize acceptance of the reality of the medical condition Commit to learning and implement a proactive approach to managing personal stresses Verbalize an understanding of the medical condition Work with therapist to develop a plan for coping with stress Learn and implement skills for managing stress Engage in social, productive activities that are possible Engage in faith based activities implement positive imagery Identify coping skills and sources of emotional support Patient's partner  and family members verbalize their fears regarding severity of health condition Identify sources of emotional distress  Learning and implement calming skills to reduce overall anxiety Learn and implement problem solving strategies Identify and engage in pleasant activities Learning and implement personal and interpersonal skills to reduce anxiety and improve interpersonal relationships Learn to accept limitations in life and commit to tolerating, rather than avoiding, unpleasant emotions while accomplishing meaningful goals Identify major life conflicts from the past and present that form the basis for present anxiety Learn and implement behavioral strategies Verbalize an understanding and resolution of current interpersonal problems Learn and implement problem solving and decision making skills Learn and implement conflict resolution skills to resolve interpersonal problems Verbalize an understanding of healthy and unhealthy emotions verbalize insight into how past relationships may be influence current experiences with depression Use mindfulness and acceptance strategies and increase value based behavior  Increase hopeful statements about the future.   Interventions Teach about stress and ways to handle stress Assist the patient in developing a coping action plan for stressors Conduct skills based training for coping strategies Train problem focused skills Sort out what activities the individual can do Encourage patient to rely upon his/her spiritual faith Teach the patient to use guided imagery Probe and evaluate family's ability to provide emotional support Allow family to share their fears Assist the patient in identifying, sorting through, and verbalizing the various feelings generated by his/her medical condition Meet with family members  Ask patient list out limitations  Use stress inoculation training  Use Acceptance and Commitment Therapy to help client accept uncomfortable  realities in order to accomplish value-consistent goals Reinforce the client's insight into the role of his/her past emotional pain and present anxiety  Discuss examples demonstrating that unrealistic worry overestimates the probability of threats and underestimate patient's ability  Assist the patient in analyzing his or her worries Help patient understand that avoidance is reinforcing  Behavioral activation help the client explore the relationship, nature of the dispute,  Help the client develop new interpersonal skills and relationships Conduct Problem so living therapy Teach conflict resolution skills Use a process-experiential approach Conduct TLDP Conduct ACT  The patient and clinician reviewed the treatment plan on 04/24/2022. The patient approved of the treatment plan.   Conception Chancy, PsyD

## 2022-08-23 ENCOUNTER — Encounter (HOSPITAL_COMMUNITY)
Admission: RE | Admit: 2022-08-23 | Discharge: 2022-08-23 | Disposition: A | Discharge status: Home / Self Care | Payer: Commercial Managed Care - PPO | Source: Ambulatory Visit | Attending: Cardiology | Admitting: Cardiology

## 2022-08-23 DIAGNOSIS — Z955 Presence of coronary angioplasty implant and graft: Secondary | ICD-10-CM

## 2022-08-26 ENCOUNTER — Other Ambulatory Visit: Payer: Self-pay | Admitting: Cardiovascular Disease

## 2022-08-26 ENCOUNTER — Encounter (HOSPITAL_COMMUNITY)
Admission: RE | Admit: 2022-08-26 | Discharge: 2022-08-26 | Disposition: A | Discharge status: Home / Self Care | Payer: Commercial Managed Care - PPO | Source: Ambulatory Visit | Attending: Cardiology | Admitting: Cardiology

## 2022-08-26 DIAGNOSIS — Z955 Presence of coronary angioplasty implant and graft: Secondary | ICD-10-CM | POA: Diagnosis not present

## 2022-08-26 DIAGNOSIS — I5022 Chronic systolic (congestive) heart failure: Secondary | ICD-10-CM

## 2022-08-26 NOTE — Telephone Encounter (Signed)
Refill Request.  

## 2022-08-28 ENCOUNTER — Encounter (HOSPITAL_COMMUNITY)
Admission: RE | Admit: 2022-08-28 | Discharge: 2022-08-28 | Disposition: A | Discharge status: Home / Self Care | Payer: Commercial Managed Care - PPO | Source: Ambulatory Visit | Attending: Cardiology | Admitting: Cardiology

## 2022-08-28 DIAGNOSIS — Z955 Presence of coronary angioplasty implant and graft: Secondary | ICD-10-CM

## 2022-08-28 DIAGNOSIS — I5022 Chronic systolic (congestive) heart failure: Secondary | ICD-10-CM

## 2022-08-30 ENCOUNTER — Encounter (HOSPITAL_COMMUNITY)
Admission: RE | Admit: 2022-08-30 | Discharge: 2022-08-30 | Disposition: A | Discharge status: Home / Self Care | Payer: Commercial Managed Care - PPO | Source: Ambulatory Visit | Attending: Cardiology | Admitting: Cardiology

## 2022-08-30 DIAGNOSIS — Z955 Presence of coronary angioplasty implant and graft: Secondary | ICD-10-CM | POA: Diagnosis not present

## 2022-08-30 DIAGNOSIS — I5022 Chronic systolic (congestive) heart failure: Secondary | ICD-10-CM

## 2022-09-02 ENCOUNTER — Encounter (HOSPITAL_COMMUNITY)
Admission: RE | Admit: 2022-09-02 | Discharge: 2022-09-02 | Disposition: A | Discharge status: Home / Self Care | Payer: Commercial Managed Care - PPO | Source: Ambulatory Visit | Attending: Cardiology | Admitting: Cardiology

## 2022-09-02 DIAGNOSIS — Z955 Presence of coronary angioplasty implant and graft: Secondary | ICD-10-CM | POA: Diagnosis not present

## 2022-09-04 ENCOUNTER — Encounter (HOSPITAL_COMMUNITY): Payer: Commercial Managed Care - PPO

## 2022-09-04 NOTE — Progress Notes (Signed)
Cardiac Individual Treatment Plan  Patient Details  Name: Christopher Gutierrez MRN: [id number] Date of Birth: August 23, 1960 Referring Provider:   Flowsheet Row INTENSIVE CARDIAC REHAB ORIENT from 07/18/2022 in [facility] for Heart, Vascular, & [place]  Referring Provider Dr Oswaldo Milian, MD       Initial Encounter Date:  [place] from 07/18/2022 in [facility] for Heart, Vascular, & Lung [facility]  Date 07/18/22       Visit Diagnosis: 12/24/21 S/P DES RCA  Patient's Home Medications on Admission:  Current Outpatient Medications:    acetaminophen (TYLENOL) 500 MG tablet, Take 500 mg by mouth every 6 (six) hours as needed for moderate pain., Disp: , Rfl:    apixaban (ELIQUIS) 5 MG TABS tablet, TAKE 1 TABLET BY MOUTH TWICE A DAY, Disp: 180 tablet, Rfl: 2   bisoprolol (ZEBETA) 10 MG tablet, Take 1 tablet (10 mg total) by mouth daily., Disp: 90 tablet, Rfl: 3   cetirizine (ZYRTEC) 10 MG tablet, Take 10 mg by mouth as needed for allergies., Disp: , Rfl:    clopidogrel (PLAVIX) 75 MG tablet, TAKE 1 TABLET BY MOUTH EVERY DAY, Disp: 90 tablet, Rfl: 3   dapagliflozin propanediol (FARXIGA) 10 MG TABS tablet, Take 1 tablet (10 mg total) by mouth daily before breakfast. NEEDS FOLLOW UP APPOINTMENT FOR ANYMORE REFILLS, Disp: 30 tablet, Rfl: 3   ENTRESTO 97-103 MG, TAKE 1 TABLET BY MOUTH TWICE A DAY, Disp: 60 tablet, Rfl: 6   ezetimibe (ZETIA) 10 MG tablet, Take 1 tablet (10 mg total) by mouth daily., Disp: 90 tablet, Rfl: 3   famotidine-calcium carbonate-magnesium hydroxide (PEPCID COMPLETE) 10-800-165 MG chewable tablet, Chew 1 tablet by mouth daily as needed (acid reflux)., Disp: , Rfl:    hydrALAZINE (APRESOLINE) 25 MG tablet, TAKE 1 TABLET (25 MG) BY MOUTH IN THE MORNING AND AT BEDTIME, Disp: 180 tablet, Rfl: 0   nitroGLYCERIN (NITROSTAT) 0.4 MG SL tablet, Place 1 tablet (0.4 mg total) under the tongue  every 5 (five) minutes as needed., Disp: 25 tablet, Rfl: 3   Polyvinyl Alcohol-Povidone (REFRESH OP), Place 1 drop into both eyes daily as needed (dry eyes)., Disp: , Rfl:    rosuvastatin (CRESTOR) 40 MG tablet, Take 1 tablet (40 mg total) by mouth daily. 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   Hyperlipidemia    Hypertension     Tobacco Use: Social History   Tobacco Use  Smoking Status Every Day   Packs/day: 1.50   Years: 25.00   Total pack years: 37.50   Types: Cigarettes  Smokeless Tobacco Current   Types: Chew  Tobacco Comments   07/18/22 Smokes 1/2 ppd not ready to quit given smoking cessation information    Labs: Review Flowsheet  More data exists      Latest Ref Rng & Units 10/31/2020 01/26/2021 11/22/2021 01/16/2022 04/25/2022  Labs for ITP Cardiac and Pulmonary Rehab  Cholestrol 100 - 199 mg/dL 153  229  181  185  143   LDL (calc) 0 - 99 mg/dL 103  139  95  95  59   HDL-C >39 mg/dL 36  69  65  72  65   Trlycerides 0 - 149 mg/dL 68  105  119  99  102     Capillary Blood Glucose: No results found for: "GLUCAP"   Exercise Target Goals: Exercise Program Goal: Individual exercise prescription set using results from initial 6 min walk test and THRR while considering  patient's activity barriers and safety.   Exercise Prescription Goal: Initial exercise prescription builds to 30-45 minutes a day of aerobic activity, 2-3 days per week.  Home exercise guidelines will be given to  patient during program as part of exercise prescription that the participant will acknowledge.  Activity Barriers & Risk Stratification:  Activity Barriers & Cardiac Risk Stratification - 07/18/22 1451       Activity Barriers & Cardiac Risk Stratification   Activity Barriers Deconditioning;Muscular Weakness;Back Problems;Balance Concerns    Cardiac Risk Stratification High             6 Minute Walk:  6 Minute Walk     Row Name 07/18/22 1445         6 Minute Walk   Phase Initial     Distance 1320 feet     Walk Time 6 minutes     # of Rest Breaks 0     MPH 2.5     METS 3.47     RPE 14     Perceived Dyspnea  0     VO2 Peak 12.14     Symptoms Yes (comment)     Comments Bilateral calf tightness/pain 3/10 resolved with rest     Resting HR 55 bpm     Resting BP 118/62     Resting Oxygen Saturation  97 %     Exercise Oxygen Saturation  during 6 min walk 98 %     Max Ex. HR 80 bpm     Max Ex. BP 130/74     2 Minute Post BP 122/74              Oxygen Initial Assessment:   Oxygen Re-Evaluation:   Oxygen Discharge (Final Oxygen Re-Evaluation):   Initial Exercise Prescription:  Initial Exercise Prescription - 07/18/22 1400       Date of Initial Exercise RX and Referring Provider   Date 07/18/22    Referring Provider Dr Oswaldo Milian, MD    Expected Discharge Date 09/13/21      NuStep   Level 2    Minutes 15    METs 1.8      Arm Ergometer   Level 2    Minutes 15    METs 1.8      Prescription Details   Frequency (times per week) 3    Duration Progress to 30 minutes of continuous aerobic without signs/symptoms of physical distress      Intensity   THRR 40-80% of Max Heartrate 64-129    Ratings of Perceived Exertion 11-13    Perceived Dyspnea 0-4      Progression   Progression Continue progressive overload as per policy without signs/symptoms or physical distress.      Resistance Training   Training Prescription Yes    Weight 3 lbs wts     Reps 10-15  Perform Capillary Blood Glucose checks as needed.  Exercise Prescription Changes:   Exercise Prescription Changes     Row Name 07/22/22 1626 08/21/22 1620           Response to Exercise   Blood Pressure (Admit) 114/80 108/70      Blood Pressure (Exercise) 142/84 132/62      Blood Pressure (Exit) 144/80 110/74      Heart Rate (Admit) 59 bpm 62 bpm      Heart Rate (Exercise) 78 bpm 75 bpm      Heart Rate (Exit) 67 bpm 58 bpm      Rating of Perceived Exertion (Exercise) 11.75 11      Perceived Dyspnea (Exercise) 0 0      Symptoms 0 0      Comments Pt first day in the CRP2 program Reviewed MET's, goals and home ExRx      Duration Progress to 30 minutes of  aerobic without signs/symptoms of physical distress Progress to 30 minutes of  aerobic without signs/symptoms of physical distress      Intensity THRR unchanged THRR unchanged        Progression   Progression Continue to progress workloads to maintain intensity without signs/symptoms of physical distress. Continue to progress workloads to maintain intensity without signs/symptoms of physical distress.      Average METs 1.75 1.8        Resistance Training   Training Prescription Yes No      Weight 3 lbs wts --      Reps 10-15 --      Time 10 Minutes --        Arm Ergometer   Level 2 2      Watts -- 9      Minutes 15 15      METs 1.6 1.8        T5 Nustep   Level 2 2      SPM 67 --      Minutes 15 15      METs 1.9 --  Pt forgot to report [place] to continue exercise at -- Home (comment)      Frequency -- Add 2 additional days to program exercise sessions.      Initial Home Exercises Provided -- 08/21/21               Exercise Comments:   Exercise Comments     Row Name 07/22/22 1631 08/19/22 1645 08/21/22 1627       Exercise Comments Pt first day in the CRP2 program. Pt tolerated exercise well with an average MET level of 1.75. Pt is learning  his THRR, RPE and ExRx. Will continue to progress pt as tolerated without sign or symptom Pt absent since 08/09/22 due to eye surgery. Will review education upon pt return. Reviewed MET's, goals and home ExRx. Pt tolerated exercise well with an average MET level of 1.8. Pt feels better about his goals, but felt he had a little set back with his eye surgery. He is ready to get back started now that the restrictions are lifted. Pt will add in exercise by doing Youtube chair fitness and Tai chi classes 1-2 days a week or 30 mins as tolerated by fatigue              Exercise Goals and Review:   Exercise Goals     Row Name 07/18/22 1454  Exercise Goals   Increase Physical Activity Yes       Intervention Provide advice, education, support and counseling about physical activity/exercise needs.;Develop an individualized exercise prescription for aerobic and resistive training based on initial evaluation findings, risk stratification, comorbidities and participant's personal goals.       Expected Outcomes Short Term: Attend rehab on a regular basis to increase amount of physical activity.;Long Term: Exercising regularly at least 3-5 days a week.;Long Term: Add in home exercise to make exercise part of routine and to increase amount of physical activity.       Increase Strength and Stamina Yes       Intervention Provide advice, education, support and counseling about physical activity/exercise needs.;Develop an individualized exercise prescription for aerobic and resistive training based on initial evaluation findings, risk stratification, comorbidities and participant's personal goals.       Expected Outcomes Short Term: Increase workloads from initial exercise prescription for resistance, speed, and METs.;Short Term: Perform resistance training exercises routinely during rehab and add in resistance training at home;Long Term: Improve cardiorespiratory fitness, muscular endurance and strength  as measured by increased METs and functional capacity (6MWT)       Able to understand and use rate of perceived exertion (RPE) scale Yes       Intervention Provide education and explanation on how to use RPE scale       Expected Outcomes Short Term: Able to use RPE daily in rehab to express subjective intensity level;Long Term:  Able to use RPE to guide intensity level when exercising independently       Knowledge and understanding of Target Heart Rate Range (THRR) Yes       Intervention Provide education and explanation of THRR including how the numbers were predicted and where they are located for reference       Expected Outcomes Short Term: Able to state/look up THRR;Long Term: Able to use THRR to govern intensity when exercising independently;Short Term: Able to use daily as guideline for intensity in rehab       Understanding of Exercise Prescription Yes       Intervention Provide education, explanation, and written materials on patient's individual exercise prescription       Expected Outcomes Short Term: Able to explain program exercise prescription;Long Term: Able to explain home exercise prescription to exercise independently                Exercise Goals Re-Evaluation :  Exercise Goals Re-Evaluation     Row Name 07/22/22 1629 08/21/22 1624           Exercise Goal Re-Evaluation   Exercise Goals Review Increase Physical Activity;Understanding of Exercise Prescription;Increase Strength and Stamina;Knowledge and understanding of Target Heart Rate Range (THRR);Able to understand and use rate of perceived exertion (RPE) scale Increase Physical Activity;Understanding of Exercise Prescription;Increase Strength and Stamina;Knowledge and understanding of Target Heart Rate Range (THRR);Able to understand and use rate of perceived exertion (RPE) scale      Comments Pt first day in the CRP2 program. Pt tolerated exercise well with an average MET level of 1.75. Pt is learning his THRR, RPE  and ExRx. Reviewed MET's, goals and home ExRx. Pt tolerated exercise well with an average MET level of 1.8. Pt feels better about his goals, but felt he had a little set back with his eye surgery. He is ready to get back started now that the restrictions are lifted. Pt will add in exercise by doing Youtube chair fitness and Tai  chi classes 1-2 days a week or 30 mins as tolerated by fatigue      Expected Outcomes Will continue to progress pt as tolerated without sign or symptom Will continue to progress pt as tolerated without sign or symptom. Pt will add in exercise and increase strength and stamina               Discharge Exercise Prescription (Final Exercise Prescription Changes):  Exercise Prescription Changes - 08/21/22 1620       Response to Exercise   Blood Pressure (Admit) 108/70    Blood Pressure (Exercise) 132/62    Blood Pressure (Exit) 110/74    Heart Rate (Admit) 62 bpm    Heart Rate (Exercise) 75 bpm    Heart Rate (Exit) 58 bpm    Rating of Perceived Exertion (Exercise) 11    Perceived Dyspnea (Exercise) 0    Symptoms 0    Comments Reviewed MET's, goals and home ExRx    Duration Progress to 30 minutes of  aerobic without signs/symptoms of physical distress    Intensity THRR unchanged      Progression   Progression Continue to progress workloads to maintain intensity without signs/symptoms of physical distress.    Average METs 1.8      Resistance Training   Training Prescription No      Arm Ergometer   Level 2    Watts 9    Minutes 15    METs 1.8      T5 Nustep   Level 2    Minutes 15    METs --   Pt forgot to report [place] to continue exercise at Home (comment)    Frequency Add 2 additional days to program exercise sessions.    Initial Home Exercises Provided 08/21/21             Nutrition:  Target Goals: Understanding of nutrition guidelines, daily intake of sodium '1500mg'$ , cholesterol '200mg'$ , calories 30% from fat  and 7% or less from saturated fats, daily to have 5 or more servings of fruits and vegetables.  Biometrics:  Pre Biometrics - 07/18/22 1443       Pre Biometrics   Waist Circumference 44 inches    Hip Circumference 43.5 inches    Waist to Hip Ratio 1.01 %    Triceps Skinfold 14 mm    % Body Fat 28.1 %    Grip Strength 45 kg    Flexibility --   Pt unable to reach   Single Leg Stand 13.7 seconds              Nutrition Therapy Plan and Nutrition Goals:  Nutrition Therapy & Goals - 08/21/22 1627       Nutrition Therapy   Diet Heart Healthy Diet    Drug/Food Interactions Statins/Certain Fruits      Personal Nutrition Goals   Nutrition Goal Patient to identify strategies for reducing cardiovascular risk by attending the [facility] education and nutrition series    Personal Goal #2 Patient to improve diet quality by using the plate method as a guide for meal planning to including lean protein/plant protein, fruits, vegetables, whole gains and nonfat dairy.    Personal Goal #3 Patient to reduce sodium intake to '1500mg'$  per day    Comments Goals in progress. Ronalee Belts remains motivated to make dietary changes and continues to attend the [employer] education series regularly. He has a plan to begin cooking more at  home and reduce convenience foods/takeout foods. He does continue regular follow-up with [facility] for behavioral health counseling. Ronalee Belts will continue to benefit from participation in intensive cardiac rehab for nutrition, exercise, and lifestyle modification.      Intervention Plan   Intervention Prescribe, educate and counsel regarding individualized specific dietary modifications aiming towards targeted core components such as weight, hypertension, lipid management, diabetes, heart failure and other comorbidities.;Nutrition handout(s) given to patient.    Expected Outcomes Short Term Goal: Understand basic principles of dietary content, such as calories, fat, sodium,  cholesterol and nutrients.;Long Term Goal: Adherence to prescribed nutrition plan.             Nutrition Assessments:  Nutrition Assessments - 07/23/22 0921       Rate Your Plate Scores   Pre Score 56            MEDIFICTS Score Key: ?70 Need to make dietary changes  40-70 Heart Healthy Diet ? 40 Therapeutic Level Cholesterol Diet   Flowsheet Row INTENSIVE CARDIAC REHAB from 07/22/2022 in [facility] for Heart, Vascular, & Lung Health  Picture Your Plate Total Score on Admission 56      Picture Your Plate Scores: <13 Unhealthy dietary pattern with much room for improvement. 41-50 Dietary pattern unlikely to meet recommendations for good health and room for improvement. 51-60 More healthful dietary pattern, with some room for improvement.  >60 Healthy dietary pattern, although there may be some specific behaviors that could be improved.    Nutrition Goals Re-Evaluation:  Nutrition Goals Re-Evaluation     [place] Name 07/22/22 1625 08/21/22 1627           Goals   Current Weight 212 lb 8.4 oz (96.4 kg) 212 lb 11.9 oz (96.5 kg)      Comment lipids WNL, GFR 53, Cr 1.51 No new labs at this time.      Expected Outcome Ronalee Belts reports enjoying a variety of foods and eating 2-3 meals per day. He reports drinking kool aid daily and 4 regular beers multiple times per week. 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 He does enjoy cooking and is looking forward to the [employer] nutrition series. Ronalee Belts will benefit from attending [facility] ICR and adherance to the [employer] eating plan to support improved diet quality, sodium intake '1500mg'$ /day, LDL <70, and HDL >40. Goals in progress. Ronalee Belts remains motivated to make dietary changes and continues to attend the [employer] education series regularly. He has a plan to begin cooking more at home and reduce convenience foods/takeout foods. He does continue regular follow-up with [facility] Ch[facility], [facility] for behavioral health  counseling. Ronalee Belts will continue to benefit from participation in intensive cardiac rehab for nutrition, exercise, and lifestyle modification.               Nutrition Goals Re-Evaluation:  Nutrition Goals Re-Evaluation     [place] Name 07/22/22 1625 08/21/22 1627           Goals   Current Weight 212 lb 8.4 oz (96.4 kg) 212 lb 11.9 oz (96.5 kg)      Comment lipids WNL, GFR 53, Cr 1.51 No new labs at this time.      Expected Outcome Ronalee Belts reports enjoying a variety of foods and eating 2-3 meals per day. He reports drinking kool aid daily and 4 regular beers multiple times per week. 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 He does enjoy cooking and is looking forward  to the [employer] nutrition series. Ronalee Belts will benefit from attending [facility] ICR and adherance to the [employer] eating plan to support improved diet quality, sodium intake '1500mg'$ /day, LDL <70, and HDL >40. Goals in progress. Ronalee Belts remains motivated to make dietary changes and continues to attend the [employer] education series regularly. He has a plan to begin cooking more at home and reduce convenience foods/takeout foods. He does continue regular follow-up with [facility] Ch[facility], [facility] for behavioral health counseling. Ronalee Belts will continue to benefit from participation in intensive cardiac rehab for nutrition, exercise, and lifestyle modification.               Nutrition Goals Discharge (Final Nutrition Goals Re-Evaluation):  Nutrition Goals Re-Evaluation - 08/21/22 1627       Goals   Current Weight 212 lb 11.9 oz (96.5 kg)    Comment No new labs at this time.    Expected Outcome Goals in progress. Ronalee Belts remains motivated to make dietary changes and continues to attend the [employer] education series regularly. He has a plan to begin cooking more at home and reduce convenience foods/takeout foods. He does continue regular follow-up with [facility] Chancy, [facility] for behavioral health counseling. Ronalee Belts will continue to benefit from  participation in intensive cardiac rehab for nutrition, exercise, and lifestyle modification.             Psychosocial: Target Goals: Acknowledge presence or absence of significant depression and/or stress, maximize coping skills, provide positive support system. Participant is able to verbalize types and ability to use techniques and skills needed for reducing stress and depression.  Initial Review & Psychosocial Screening:  Initial Psych Review & Screening - 07/22/22 1540       Initial Review   Current issues with Current Stress Concerns    Source of Stress Concerns Chronic Illness;Occupation;Unable to participate in former interests or hobbies;Family;Unable to perform yard/household activities;Financial    Comments will continue to monitor and offer support as needed. Reviewed quality of life questionnaire on 07/24/22 has many life stressors is receiving counselling      Family Dynamics   Good Support System? No    Strains Illness and family care strain    Concerns Inappropriate over/under dependence on family/friends      Barriers   Psychosocial barriers to participate in program The patient should benefit from training in stress management and relaxation.      Screening Interventions   Interventions Encouraged to exercise;To provide support and resources with identified psychosocial needs;Provide feedback about the scores to participant    Expected Outcomes Long Term Goal: Stressors or current issues are controlled or eliminated.;Short Term goal: Utilizing psychosocial counselor, staff and physician to assist with identification of specific Stressors or current issues interfering with healing process. Setting desired goal for each stressor or current issue identified.;Short Term goal: Identification and review with participant of any Quality of Life or Depression concerns found by scoring the questionnaire.;Long Term goal: The participant improves quality of Life and PHQ9 Scores as  seen by post scores and/or verbalization of changes             Quality of Life Scores:  Quality of Life - 07/18/22 1458       Quality of Life   Select Quality of Life      Quality of Life Scores   Health/Function Pre 12.37 %    Socioeconomic Pre 13.57 %    Psych/Spiritual Pre 13.14 %    Family Pre 18.5 %    GLOBAL Pre  13.53 %            Scores of 19 and below usually indicate a poorer quality of life in these areas.  A difference of  2-3 points is a clinically meaningful difference.  A difference of 2-3 points in the total score of the Quality of Life Index has been associated with significant improvement in overall quality of life, self-image, physical symptoms, and general health in studies assessing change in quality of life.  PHQ-9: Review Flowsheet  More data may exist      07/18/2022 07/13/2021 05/17/2021 10/12/2020 09/29/2017  Depression screen PHQ 2/9  Decreased Interest '1 1 1 1 '$ 0  Down, Depressed, Hopeless 0 0 2 1 0  PHQ - 2 Score '1 1 3 2 '$ 0  Altered sleeping - - 2 0 -  Tired, decreased energy - - 1 2 -  Change in appetite - - 0 0 -  Feeling bad or failure about yourself  - - 1 2 -  Trouble concentrating - - 1 2 -  Moving slowly or fidgety/restless - - 1 2 -  Suicidal thoughts - - 0 0 -  PHQ-9 Score - - 9 10 -  Difficult doing work/chores - - Somewhat difficult Somewhat difficult -   Interpretation of Total Score  Total Score Depression Severity:  1-4 = Minimal depression, 5-9 = Mild depression, 10-14 = Moderate depression, 15-19 = Moderately severe depression, 20-27 = Severe depression   Psychosocial Evaluation and Intervention:   Psychosocial Re-Evaluation:  Psychosocial Re-Evaluation     Row Name 07/22/22 1540 08/06/22 1542 08/06/22 1543 09/04/22 1646       Psychosocial Re-Evaluation   Current issues with Current Stress Concerns Current Stress Concerns Current Stress Concerns Current Stress Concerns    Comments No increased concerns or stressors  voiced on his first day of exercise. Will review quality of life questionnaire in the upcoming week. No increased concerns or stressors voiced on his first day of exercise. Quality of life reviewed. Receives counseling from [facility] No increased concerns or stressors voiced on his first day of exercise. Quality of life reviewed. Receives counseling from [place] has not voiced any increased concerns or stressors.  Receives counseling from [place] [facility]. Ronalee Belts says this is helpful    Expected Outcomes Ronalee Belts will have decreased  or better controlled stress upon completion of intensive cardiac rehab. Ronalee Belts will have decreased  or better controlled stress upon completion of intensive cardiac rehab. Ronalee Belts will have decreased  or better controlled stress upon completion of intensive cardiac rehab. Ronalee Belts will have decreased  or better controlled stress upon completion of intensive cardiac rehab.    Interventions Stress management education;Encouraged to attend Cardiac Rehabilitation for the exercise;Relaxation education Stress management education;Encouraged to attend Cardiac Rehabilitation for the exercise;Relaxation education Stress management education;Encouraged to attend Cardiac Rehabilitation for the exercise;Relaxation education Stress management education;Encouraged to attend Cardiac Rehabilitation for the exercise;Relaxation education    Continue Psychosocial Services  Follow up required by staff Follow up required by staff Follow up required by staff No Follow up required      Initial Review   Source of Stress Concerns -- Chronic Illness;Occupation;Unable to participate in former interests or hobbies;Family;Unable to perform yard/household activities;Financial Chronic Illness;Occupation;Unable to participate in former interests or hobbies;Family;Unable to perform yard/household activities;Financial Chronic Illness;Occupation;Unable to participate in former interests or  hobbies;Family;Unable to perform yard/household activities;Financial    Comments -- will continue to monitor and offer  support as needed. Reviewed quality of life questionnaire on 07/24/22 has many life stressors is receiving counselling will continue to monitor and offer support as needed. Reviewed quality of life questionnaire on 07/24/22 has many life stressors is receiving counselling will continue to monitor and offer support as needed. Reviewed quality of life questionnaire on 07/24/22 has many life stressors is receiving counselling             Psychosocial Discharge (Final Psychosocial Re-Evaluation):  Psychosocial Re-Evaluation - 09/04/22 1646       Psychosocial Re-Evaluation   Current issues with Current Stress Concerns    Comments Ronalee Belts has not voiced any increased concerns or stressors.  Receives counseling from [place] [facility]. Ronalee Belts says this is helpful    Expected Outcomes Ronalee Belts will have decreased  or better controlled stress upon completion of intensive cardiac rehab.    Interventions Stress management education;Encouraged to attend Cardiac Rehabilitation for the exercise;Relaxation education    Continue Psychosocial Services  No Follow up required      Initial Review   Source of Stress Concerns Chronic Illness;Occupation;Unable to participate in former interests or hobbies;Family;Unable to perform yard/household activities;Financial    Comments will continue to monitor and offer support as needed. Reviewed quality of life questionnaire on 07/24/22 has many life stressors is receiving counselling             Vocational Rehabilitation: Provide vocational rehab assistance to qualifying candidates.   Vocational Rehab Evaluation & Intervention:  Vocational Rehab - 07/18/22 1558       Initial Vocational Rehab Evaluation & Intervention   Assessment shows need for Vocational Rehabilitation No   Ronalee Belts is not currently working as he was on long term disabilty he will let  is know in January if he is interested in participating in Vocational Rehab            Education: Education Goals: Education classes will be provided on a weekly basis, covering required topics. Participant will state understanding/return demonstration of topics presented.    Education     Row Name 07/22/22 1500     Education   Cardiac Education Topics Pritikin   IT sales professional Nutrition   Nutrition Workshop Fueling a Designer, multimedia   Instruction Review Code 1- Engineer, civil (consulting) Start Time 1300   Class Stop Time 1345   Class Time Calculation (min) 45 min    [place] Name 07/24/22 1500     Education   Cardiac Education Topics Pritikin   Financial trader   Weekly Topic International Cuisine- Spotlight on the [place]   Instruction Review Code 1- Verbalizes Understanding   Class Start Time 1355   Class Stop Time 1435   Class Time Calculation (min) 40 min    [place] Name 07/26/22 1500     Education   Cardiac Education Topics Pritikin   [employer] Workshops     Workshops   Educator Exercise Physiologist   Select Psychosocial   Psychosocial Workshop Recognizing and Reducing Stress   Instruction Review Code 1- Verbalizes Understanding   Class Start Time 1355   Class Stop Time 1445   Class Time Calculation (min) 50 min    [place] Name 07/31/22 1600     Education   Cardiac Education Topics Pritikin   Charity fundraiser Exercise  Physiologist   Select Nutrition   Nutrition Cooking - Healthy Salads and Dressing   Instruction Review Code 1- Verbalizes Understanding   Class Start Time 1358   Class Stop Time 1442   Class Time Calculation (min) 44 min    [place] Name 08/02/22 1500     Education   Cardiac Education Topics Pritikin   Architect Education   General  Education Heart Disease Risk Reduction   Instruction Review Code 1- Verbalizes Understanding   Class Start Time 1400   Class Stop Time 1440   Class Time Calculation (min) 40 min    [place] Name 08/07/22 1500     Education   Cardiac Education Topics Pritikin   Financial trader   Weekly Topic Fast and Healthy Breakfasts   Instruction Review Code 1- Verbalizes Understanding   Class Start Time 5631   Class Stop Time 1438   Class Time Calculation (min) 43 min    [place] Name 08/09/22 1500     Education   Cardiac Education Topics Pritikin   Lexicographer Nutrition   Nutrition Overview of the [place]   Instruction Review Code 1- Verbalizes Understanding   Class Start Time 1400   Class Stop Time 1440   Class Time Calculation (min) 40 min    [place] Name 08/22/22 1000     Education   Cardiac Education Topics Pritikin   [employer]  completed 08/21/22     Cooking School   Educator Dietitian   Weekly Topic [employer] Desserts   Instruction Review Code 1- Information systems manager   Class Start Time 1400   Class Stop Time 1458   Class Time Calculation (min) 58 min    [place] Name 08/23/22 1600     Education   Cardiac Education Topics Pritikin   Lexicographer Nutrition   Nutrition Other  Label reading   Instruction Review Code 1- Verbalizes Understanding   Class Start Time 1400   Class Stop Time 1448   Class Time Calculation (min) 48 min    [place] Name 08/26/22 [place]   [employer]     Workshops   Educator Exercise Physiologist   Select Psychosocial   Psychosocial Workshop Other  Focused goals and sustainable changes   Instruction Review Code 1- Verbalizes Understanding   Class Start Time 1402   Class Stop Time 1445   Class Time Calculation (min) 43 min     [place] Name 08/28/22 1600     Education   Cardiac Education Topics Pritikin   Financial trader   Weekly Topic Tasty Appetizers and Snacks   Instruction Review Code 1- Verbalizes Understanding   Class Start Time 1402   Class Stop Time 1448   Class Time Calculation (min) 46 min    [place] Name 08/30/22 1500     Education   Cardiac Education Topics Pritikin   Lexicographer Nutrition   Nutrition Calorie Density   Instruction Review Code 1- Verbalizes Understanding  Class Start Time 1400   Class Stop Time 1442   Class Time Calculation (min) 42 min    [place] Name 09/02/22 1500     Education   Cardiac Education Topics [place]   [employer] Workshops     Workshops   Educator Exercise Physiologist   Select Exercise   Exercise Workshop Exercise Basics: Building Your Action Plan   Instruction Review Code 1- Verbalizes Understanding   Class Start Time 1359   Class Stop Time 1445   Class Time Calculation (min) 46 min            Core Videos: Exercise    Move It!  Clinical staff conducted group or individual video education with verbal and written material and guidebook.  Patient learns the recommended Pritikin exercise program. Exercise with the goal of living a long, healthy life. Some of the health benefits of exercise include controlled diabetes, healthier blood pressure levels, improved cholesterol levels, improved heart and lung capacity, improved sleep, and better body composition. Everyone should speak with their doctor before starting or changing an exercise routine.  Biomechanical Limitations Clinical staff conducted group or individual video education with verbal and written material and guidebook.  Patient learns how biomechanical limitations can impact exercise and how we can mitigate and possibly overcome limitations to have an impactful and balanced exercise  routine.  Body Composition Clinical staff conducted group or individual video education with verbal and written material and guidebook.  Patient learns that body composition (ratio of muscle mass to fat mass) is a key component to assessing overall fitness, rather than body weight alone. Increased fat mass, especially visceral belly fat, can put [place] at increased risk for metabolic syndrome, type 2 diabetes, heart disease, and even death. It is recommended to combine diet and exercise (cardiovascular and resistance training) to improve your body composition. Seek guidance from your physician and exercise physiologist before implementing an exercise routine.  Exercise Action Plan Clinical staff conducted group or individual video education with verbal and written material and guidebook.  Patient learns the recommended strategies to achieve and enjoy long-term exercise adherence, including variety, self-motivation, self-efficacy, and positive decision making. Benefits of exercise include fitness, good health, weight management, more energy, better sleep, less stress, and overall well-being.  Medical   Heart Disease Risk Reduction Clinical staff conducted group or individual video education with verbal and written material and guidebook.  Patient learns our heart is our most vital organ as it circulates oxygen, nutrients, white blood cells, and hormones throughout the entire body, and carries waste away. Data supports a plant-based eating plan like the Pritikin Program for its effectiveness in slowing progression of and reversing heart disease. The video provides a number of recommendations to address heart disease.   Metabolic Syndrome and Belly Fat  Clinical staff conducted group or individual video education with verbal and written material and guidebook.  Patient learns what metabolic syndrome is, how it leads to heart disease, and how one can reverse it and keep it from coming back. You have  metabolic syndrome if you have 3 of the following 5 criteria: abdominal obesity, high blood pressure, high triglycerides, low HDL cholesterol, and high blood sugar.  Hypertension and Heart Disease Clinical staff conducted group or individual video education with verbal and written material and guidebook.  Patient learns that high blood pressure, or hypertension, is very common in the [place]. Hypertension is largely due to excessive salt intake, but other important risk factors include being overweight, physical inactivity,  drinking too much alcohol, smoking, and not eating enough potassium from fruits and vegetables. High blood pressure is a leading risk factor for heart attack, stroke, congestive heart failure, dementia, kidney failure, and premature death. Long-term effects of excessive salt intake include stiffening of the arteries and thickening of heart muscle and organ damage. Recommendations include ways to reduce hypertension and the risk of heart disease.  Diseases of Our Time - Focusing on Diabetes Clinical staff conducted group or individual video education with verbal and written material and guidebook.  Patient learns why the best way to stop diseases of our time is prevention, through food and other lifestyle changes. Medicine (such as prescription pills and surgeries) is often only a Band-Aid on the problem, not a long-term solution. Most common diseases of our time include obesity, type 2 diabetes, hypertension, heart disease, and cancer. The Pritikin Program is recommended and has been proven to help reduce, reverse, and/or prevent the damaging effects of metabolic syndrome.  Nutrition   Overview of the Pritikin Eating Plan  Clinical staff conducted group or individual video education with verbal and written material and guidebook.  Patient learns about the [place] for disease risk reduction. The [place] emphasizes a wide variety of unrefined,  minimally-processed carbohydrates, like fruits, vegetables, whole grains, and legumes. Go, Caution, and Stop food choices are explained. Plant-based and lean animal proteins are emphasized. Rationale provided for low sodium intake for blood pressure control, low added sugars for blood sugar stabilization, and low added fats and oils for coronary artery disease risk reduction and weight management.  Calorie Density  Clinical staff conducted group or individual video education with verbal and written material and guidebook.  Patient learns about calorie density and how it impacts the Pritikin Eating Plan. Knowing the characteristics of the food you choose will help you decide whether those foods will lead to weight gain or weight loss, and whether you want to consume more or less of them. Weight loss is usually a side effect of the Pritikin Eating Plan because of its focus on low calorie-dense foods.  Label Reading  Clinical staff conducted group or individual video education with verbal and written material and guidebook.  Patient learns about the Pritikin recommended label reading guidelines and corresponding recommendations regarding calorie density, added sugars, sodium content, and whole grains.  Dining Out - Part 1  Clinical staff conducted group or individual video education with verbal and written material and guidebook.  Patient learns that restaurant meals can be sabotaging because they can be so high in calories, fat, sodium, and/or sugar. Patient learns recommended strategies on how to positively address this and avoid unhealthy pitfalls.  Facts on Fats  Clinical staff conducted group or individual video education with verbal and written material and guidebook.  Patient learns that lifestyle modifications can be just as effective, if not more so, as many medications for lowering your risk of heart disease. A Pritikin lifestyle can help to reduce your risk of inflammation and atherosclerosis  (cholesterol build-up, or plaque, in the artery walls). Lifestyle interventions such as dietary choices and physical activity address the cause of atherosclerosis. A review of the types of fats and their impact on blood cholesterol levels, along with dietary recommendations to reduce fat intake is also included.  Nutrition Action Plan  Clinical staff conducted group or individual video education with verbal and written material and guidebook.  Patient learns how to incorporate Pritikin recommendations into their lifestyle. Recommendations include planning and keeping  personal health goals in mind as an important part of their success.  Healthy Mind-Set    Healthy Minds, Bodies, Hearts  Clinical staff conducted group or individual video education with verbal and written material and guidebook.  Patient learns how to identify when they are stressed. Video will discuss the impact of that stress, as well as the many benefits of stress management. Patient will also be introduced to stress management techniques. The way we think, act, and feel has an impact on our hearts.  How Our Thoughts Can Heal Our Hearts  Clinical staff conducted group or individual video education with verbal and written material and guidebook.  Patient learns that negative thoughts can cause depression and anxiety. This can result in negative lifestyle behavior and serious health problems. Cognitive behavioral therapy is an effective method to help control our thoughts in order to change and improve our emotional outlook.  Additional Videos:  Exercise    Improving Performance  Clinical staff conducted group or individual video education with verbal and written material and guidebook.  Patient learns to use a non-linear approach by alternating intensity levels and lengths of time spent exercising to help burn more calories and lose more body fat. Cardiovascular exercise helps improve heart health, metabolism, hormonal balance,  blood sugar control, and recovery from fatigue. Resistance training improves strength, endurance, balance, coordination, reaction time, metabolism, and muscle mass. Flexibility exercise improves circulation, posture, and balance. Seek guidance from your physician and exercise physiologist before implementing an exercise routine and learn your capabilities and proper form for all exercise.  Introduction to Yoga  Clinical staff conducted group or individual video education with verbal and written material and guidebook.  Patient learns about yoga, a discipline of the coming together of mind, breath, and body. The benefits of yoga include improved flexibility, improved range of motion, better posture and core strength, increased lung function, weight loss, and positive self-image. Yoga's heart health benefits include lowered blood pressure, healthier heart rate, decreased cholesterol and triglyceride levels, improved immune function, and reduced stress. Seek guidance from your physician and exercise physiologist before implementing an exercise routine and learn your capabilities and proper form for all exercise.  Medical   Aging: Enhancing Your Quality of Life  Clinical staff conducted group or individual video education with verbal and written material and guidebook.  Patient learns key strategies and recommendations to stay in good physical health and enhance quality of life, such as prevention strategies, having an advocate, securing a [place], and keeping a list of medications and system for tracking them. It also discusses how to avoid risk for bone loss.  Biology of Weight Control  Clinical staff conducted group or individual video education with verbal and written material and guidebook.  Patient learns that weight gain occurs because we consume more calories than we burn (eating more, moving less). Even if your body weight is normal, you may have higher ratios of fat  compared to muscle mass. Too much body fat puts you at increased risk for cardiovascular disease, heart attack, stroke, type 2 diabetes, and obesity-related cancers. In addition to exercise, following the [place] can help reduce your risk.  Decoding Lab Results  Clinical staff conducted group or individual video education with verbal and written material and guidebook.  Patient learns that lab test reflects one measurement whose values change over time and are influenced by many factors, including medication, stress, sleep, exercise, food, hydration, pre-existing medical conditions, and more.  It is recommended to use the knowledge from this video to become more involved with your lab results and evaluate your numbers to speak with your doctor.   Diseases of Our Time - Overview  Clinical staff conducted group or individual video education with verbal and written material and guidebook.  Patient learns that according to the CDC, 50% to 70% of chronic diseases (such as obesity, type 2 diabetes, elevated lipids, hypertension, and heart disease) are avoidable through lifestyle improvements including healthier food choices, listening to satiety cues, and increased physical activity.  Sleep Disorders Clinical staff conducted group or individual video education with verbal and written material and guidebook.  Patient learns how good quality and duration of sleep are important to overall health and well-being. Patient also learns about sleep disorders and how they impact health along with recommendations to address them, including discussing with a physician.  Nutrition  Dining Out - Part 2 Clinical staff conducted group or individual video education with verbal and written material and guidebook.  Patient learns how to plan ahead and communicate in order to maximize their dining experience in a healthy and nutritious manner. Included are recommended food choices based on the type of restaurant  the patient is visiting.   Fueling a Best boy conducted group or individual video education with verbal and written material and guidebook.  There is a strong connection between our food choices and our health. Diseases like obesity and type 2 diabetes are very prevalent and are in large-part due to lifestyle choices. The Pritikin Eating Plan provides plenty of food and hunger-curbing satisfaction. It is easy to follow, affordable, and helps reduce health risks.  Menu Workshop  Clinical staff conducted group or individual video education with verbal and written material and guidebook.  Patient learns that restaurant meals can sabotage health goals because they are often packed with calories, fat, sodium, and sugar. Recommendations include strategies to plan ahead and to communicate with the manager, chef, or server to help order a healthier meal.  Planning Your Eating Strategy  Clinical staff conducted group or individual video education with verbal and written material and guidebook.  Patient learns about the [place] and its benefit of reducing the risk of disease. The [place] does not focus on calories. Instead, it emphasizes high-quality, nutrient-rich foods. By knowing the characteristics of the foods, we choose, we can determine their calorie density and make informed decisions.  Targeting Your Nutrition Priorities  Clinical staff conducted group or individual video education with verbal and written material and guidebook.  Patient learns that lifestyle habits have a tremendous impact on disease risk and progression. This video provides eating and physical activity recommendations based on your personal health goals, such as reducing LDL cholesterol, losing weight, preventing or controlling type 2 diabetes, and reducing high blood pressure.  Vitamins and Minerals  Clinical staff conducted group or individual video education with verbal and written  material and guidebook.  Patient learns different ways to obtain key vitamins and minerals, including through a recommended healthy diet. It is important to discuss all supplements you take with your doctor.   Healthy Mind-Set    Smoking Cessation  Clinical staff conducted group or individual video education with verbal and written material and guidebook.  Patient learns that cigarette smoking and tobacco addiction pose a serious health risk which affects millions of people. Stopping smoking will significantly reduce the risk of heart disease, lung disease, and many forms of cancer. Recommended  strategies for quitting are covered, including working with your doctor to develop a successful plan.  Culinary   Becoming a Financial trader conducted group or individual video education with verbal and written material and guidebook.  Patient learns that cooking at home can be healthy, cost-effective, quick, and puts them in control. Keys to cooking healthy recipes will include looking at your recipe, assessing your equipment needs, planning ahead, making it simple, choosing cost-effective seasonal ingredients, and limiting the use of added fats, salts, and sugars.  Cooking - Breakfast and Snacks  Clinical staff conducted group or individual video education with verbal and written material and guidebook.  Patient learns how important breakfast is to satiety and nutrition through the entire day. Recommendations include key foods to eat during breakfast to help stabilize blood sugar levels and to prevent overeating at meals later in the day. Planning ahead is also a key component.  Cooking - Human resources officer conducted group or individual video education with verbal and written material and guidebook.  Patient learns eating strategies to improve overall health, including an approach to cook more at home. Recommendations include thinking of animal protein as a side on your plate  rather than center stage and focusing instead on lower calorie dense options like vegetables, fruits, whole grains, and plant-based proteins, such as beans. Making sauces in large quantities to freeze for later and leaving the skin on your vegetables are also recommended to maximize your experience.  Cooking - Healthy Salads and Dressing Clinical staff conducted group or individual video education with verbal and written material and guidebook.  Patient learns that vegetables, fruits, whole grains, and legumes are the foundations of the [place]. Recommendations include how to incorporate each of these in flavorful and healthy salads, and how to create homemade salad dressings. Proper handling of ingredients is also covered. Cooking - Soups and [employer] - Soups and Desserts Clinical staff conducted group or individual video education with verbal and written material and guidebook.  Patient learns that Pritikin soups and desserts make for easy, nutritious, and delicious snacks and meal components that are low in sodium, fat, sugar, and calorie density, while high in vitamins, minerals, and filling fiber. Recommendations include simple and healthy ideas for soups and desserts.   Overview     The Pritikin Solution Program Overview Clinical staff conducted group or individual video education with verbal and written material and guidebook.  Patient learns that the results of the [place] Program have been documented in more than 100 articles published in peer-reviewed journals, and the benefits include reducing risk factors for (and, in some cases, even reversing) high cholesterol, high blood pressure, type 2 diabetes, obesity, and more! An overview of the three key pillars of the Pritikin Program will be covered: eating well, doing regular exercise, and having a healthy mind-set.  WORKSHOPS  Exercise: Exercise Basics: Building Your Action Plan Clinical staff led group instruction  and group discussion with PowerPoint presentation and patient guidebook. To enhance the learning environment the use of posters, models and videos may be added. At the conclusion of this workshop, patients will comprehend the difference between physical activity and exercise, as well as the benefits of incorporating both, into their routine. Patients will understand the FITT (Frequency, Intensity, Time, and Type) principle and how to use it to build an exercise action plan. In addition, safety concerns and other considerations for exercise and cardiac rehab will be addressed by the presenter.  The purpose of this lesson is to promote a comprehensive and effective weekly exercise routine in order to improve patients' overall level of fitness.   Managing Heart Disease: Your Path to a Healthier Heart Clinical staff led group instruction and group discussion with PowerPoint presentation and patient guidebook. To enhance the learning environment the use of posters, models and videos may be added.At the conclusion of this workshop, patients will understand the anatomy and physiology of the heart. Additionally, they will understand how Pritikin's three pillars impact the risk factors, the progression, and the management of heart disease.  The purpose of this lesson is to provide a high-level overview of the heart, heart disease, and how the Pritikin lifestyle positively impacts risk factors.  Exercise Biomechanics Clinical staff led group instruction and group discussion with PowerPoint presentation and patient guidebook. To enhance the learning environment the use of posters, models and videos may be added. Patients will learn how the structural parts of their bodies function and how these functions impact their daily activities, movement, and exercise. Patients will learn how to promote a neutral spine, learn how to manage pain, and identify ways to improve their physical movement in order to  promote healthy living. The purpose of this lesson is to expose patients to common physical limitations that impact physical activity. Participants will learn practical ways to adapt and manage aches and pains, and to minimize their effect on regular exercise. Patients will learn how to maintain good posture while sitting, walking, and lifting.  Balance Training and Fall Prevention  Clinical staff led group instruction and group discussion with PowerPoint presentation and patient guidebook. To enhance the learning environment the use of posters, models and videos may be added. At the conclusion of this workshop, patients will understand the importance of their sensorimotor skills (vision, proprioception, and the vestibular system) in maintaining their ability to balance as they age. Patients will apply a variety of balancing exercises that are appropriate for their current level of function. Patients will understand the common causes for poor balance, possible solutions to these problems, and ways to modify their physical environment in order to minimize their fall risk. The purpose of this lesson is to teach patients about the importance of maintaining balance as they age and ways to minimize their risk of falling.  WORKSHOPS   Nutrition:  Fueling a Scientist, research (physical sciences) led group instruction and group discussion with PowerPoint presentation and patient guidebook. To enhance the learning environment the use of posters, models and videos may be added. Patients will review the foundational principles of the [place] and understand what constitutes a serving size in each of the food groups. Patients will also learn Pritikin-friendly foods that are better choices when away from home and review make-ahead meal and snack options. Calorie density will be reviewed and applied to three nutrition priorities: weight maintenance, weight loss, and weight gain. The purpose of this lesson is to  reinforce (in a group setting) the key concepts around what patients are recommended to eat and how to apply these guidelines when away from home by planning and selecting Pritikin-friendly options. Patients will understand how calorie density may be adjusted for different weight management goals.  Mindful Eating  Clinical staff led group instruction and group discussion with PowerPoint presentation and patient guidebook. To enhance the learning environment the use of posters, models and videos may be added. Patients will briefly review the concepts of the [place] and the importance of low-calorie dense  foods. The concept of mindful eating will be introduced as well as the importance of paying attention to internal hunger signals. Triggers for non-hunger eating and techniques for dealing with triggers will be explored. The purpose of this lesson is to provide patients with the opportunity to review the basic principles of the [place], discuss the value of eating mindfully and how to measure internal cues of hunger and fullness using the Hunger Scale. Patients will also discuss reasons for non-hunger eating and learn strategies to use for controlling emotional eating.  Targeting Your Nutrition Priorities Clinical staff led group instruction and group discussion with PowerPoint presentation and patient guidebook. To enhance the learning environment the use of posters, models and videos may be added. Patients will learn how to determine their genetic susceptibility to disease by reviewing their family history. Patients will gain insight into the importance of diet as part of an overall healthy lifestyle in mitigating the impact of genetics and other environmental insults. The purpose of this lesson is to provide patients with the opportunity to assess their personal nutrition priorities by looking at their family history, their own health history and current risk factors. Patients will  also be able to discuss ways of prioritizing and modifying the [place] for their highest risk areas  Menu  Clinical staff led group instruction and group discussion with PowerPoint presentation and patient guidebook. To enhance the learning environment the use of posters, models and videos may be added. Using menus brought in from [employer], or printed from [employer], patients will apply the [place] dining out guidelines that were presented in the [employer] video. Patients will also be able to practice these guidelines in a variety of provided scenarios. The purpose of this lesson is to provide patients with the opportunity to practice hands-on learning of the [place] with actual menus and practice scenarios.  Label Reading Clinical staff led group instruction and group discussion with PowerPoint presentation and patient guidebook. To enhance the learning environment the use of posters, models and videos may be added. Patients will review and discuss the Pritikin label reading guidelines presented in Pritikin's Label Reading Educational series video. Using fool labels brought in from local grocery stores and markets, patients will apply the label reading guidelines and determine if the packaged food meet the Pritikin guidelines. The purpose of this lesson is to provide patients with the opportunity to review, discuss, and practice hands-on learning of the Pritikin Label Reading guidelines with actual packaged food labels. [place] Workshops are designed to teach patients ways to prepare quick, simple, and affordable recipes at home. The importance of nutrition's role in chronic disease risk reduction is reflected in its emphasis in the overall Pritikin program. By learning how to prepare essential core Pritikin Eating Plan recipes, patients will increase control over what they eat; be able to customize the  flavor of foods without the use of added salt, sugar, or fat; and improve the quality of the food they consume. By learning a set of core recipes which are easily assembled, quickly prepared, and affordable, patients are more likely to prepare more healthy foods at home. These workshops focus on convenient breakfasts, simple entres, side dishes, and desserts which can be prepared with minimal effort and are consistent with nutrition recommendations for cardiovascular risk reduction. Cooking [employer] are taught by a Engineer, materials (RD) who has been trained by the [employer]  team. The chef or RD has a clear understanding of the importance of minimizing - if not completely eliminating - added fat, sugar, and sodium in recipes. Throughout the series of [place] Workshop sessions, patients will learn about healthy ingredients and efficient methods of cooking to build confidence in their capability to prepare    Cooking School weekly topics:  Adding Flavor- Sodium-Free  Fast and Healthy Breakfasts  Powerhouse Plant-Based Proteins  Satisfying Salads and Dressings  Simple Sides and Sauces  International Cuisine-Spotlight on the [place] Zones  Delicious Desserts  Savory Soups  Efficiency Cooking - Meals in a Snap  Tasty Appetizers and Snacks  Comforting Weekend Breakfasts  One-Pot Wonders   Fast Evening Meals  Easy [place] (Psychosocial): New Thoughts, New Behaviors Clinical staff led group instruction and group discussion with PowerPoint presentation and patient guidebook. To enhance the learning environment the use of posters, models and videos may be added. Patients will learn and practice techniques for developing effective health and lifestyle goals. Patients will be able to effectively apply the goal setting process learned to develop at least one new personal goal.  The purpose of this  lesson is to expose patients to a new skill set of behavior modification techniques such as techniques setting SMART goals, overcoming barriers, and achieving new thoughts and new behaviors.  Managing Moods and Relationships Clinical staff led group instruction and group discussion with PowerPoint presentation and patient guidebook. To enhance the learning environment the use of posters, models and videos may be added. Patients will learn how emotional and chronic stress factors can impact their health and relationships. They will learn healthy ways to manage their moods and utilize positive coping mechanisms. In addition, ICR patients will learn ways to improve communication skills. The purpose of this lesson is to expose patients to ways of understanding how one's mood and health are intimately connected. Developing a healthy outlook can help build positive relationships and connections with others. Patients will understand the importance of utilizing effective communication skills that include actively listening and being heard. They will learn and understand the importance of the "4 Cs" and especially Connections in fostering of a Healthy Mind-Set.  Healthy Sleep for a Healthy Heart Clinical staff led group instruction and group discussion with PowerPoint presentation and patient guidebook. To enhance the learning environment the use of posters, models and videos may be added. At the conclusion of this workshop, patients will be able to demonstrate knowledge of the importance of sleep to overall health, well-being, and quality of life. They will understand the symptoms of, and treatments for, common sleep disorders. Patients will also be able to identify daytime and nighttime behaviors which impact sleep, and they will be able to apply these tools to help manage sleep-related challenges. The purpose of this lesson is to provide patients with a general overview of sleep and outline the importance of quality  sleep. Patients will learn about a few of the most common sleep disorders. Patients will also be introduced to the concept of "sleep hygiene," and discover ways to self-manage certain sleeping problems through simple daily behavior changes. Finally, the workshop will motivate patients by clarifying the links between quality sleep and their goals of heart-healthy living.   Recognizing and Reducing Stress Clinical staff led group instruction and group discussion with PowerPoint presentation and patient guidebook. To enhance the learning environment the use of posters, models and videos may be added. At the  conclusion of this workshop, patients will be able to understand the types of stress reactions, differentiate between acute and chronic stress, and recognize the impact that chronic stress has on their health. They will also be able to apply different coping mechanisms, such as reframing negative self-talk. Patients will have the opportunity to practice a variety of stress management techniques, such as deep abdominal breathing, progressive muscle relaxation, and/or guided imagery.  The purpose of this lesson is to educate patients on the role of stress in their lives and to provide healthy techniques for coping with it.  Learning Barriers/Preferences:  Learning Barriers/Preferences - 07/18/22 1503       Learning Barriers/Preferences   Learning Barriers Sight;Exercise Concerns    Learning Preferences Audio;Computer/Internet;Group Instruction;Individual Instruction;Pictoral;Skilled Demonstration;Verbal Instruction;Video;Written Material             Education Topics:  Knowledge Questionnaire Score:  Knowledge Questionnaire Score - 07/18/22 1503       Knowledge Questionnaire Score   Pre Score 20/24             Core Components/Risk Factors/Patient Goals at Admission:  Personal Goals and Risk Factors at Admission - 07/18/22 1506       Core Components/Risk Factors/Patient Goals on  Admission    Weight Management Yes;Weight Maintenance    Intervention Weight Management: Develop a combined nutrition and exercise program designed to reach desired caloric intake, while maintaining appropriate intake of nutrient and fiber, sodium and fats, and appropriate energy expenditure required for the weight goal.;Weight Management: Provide education and appropriate resources to help participant work on and attain dietary goals.    Expected Outcomes Short Term: Continue to assess and modify interventions until short term weight is achieved;Long Term: Adherence to nutrition and physical activity/exercise program aimed toward attainment of established weight goal;Weight Loss: Understanding of general recommendations for a balanced deficit meal plan, which promotes 1-2 lb weight loss per week and includes a negative energy balance of [phone] kcal/d;Weight Maintenance: Understanding of the daily nutrition guidelines, which includes 25-35% calories from fat, 7% or less cal from saturated fats, less than '200mg'$  cholesterol, less than 1.5gm of sodium, & 5 or more servings of fruits and vegetables daily;Understanding recommendations for meals to include 15-35% energy as protein, 25-35% energy from fat, 35-60% energy from carbohydrates, less than '200mg'$  of dietary cholesterol, 20-35 gm of total fiber daily;Understanding of distribution of calorie intake throughout the day with the consumption of 4-5 meals/snacks    Tobacco Cessation Yes    Number of packs per day 1/2 PPD    Intervention Assist the participant in steps to quit. Provide individualized education and counseling about committing to Tobacco Cessation, relapse prevention, and pharmacological support that can be provided by physician.;Advice worker, assist with locating and accessing local/national Quit Smoking programs, and support quit date choice.    Expected Outcomes Short Term: Will demonstrate readiness to quit, by selecting a quit  date.;Short Term: Will quit all tobacco product use, adhering to prevention of relapse plan.;Long Term: Complete abstinence from all tobacco products for at least 12 months from quit date.    Heart Failure Yes    Intervention Provide a combined exercise and nutrition program that is supplemented with education, support and counseling about heart failure. Directed toward relieving symptoms such as shortness of breath, decreased exercise tolerance, and extremity edema.    Expected Outcomes Improve functional capacity of life;Short term: Attendance in program 2-3 days a week with increased exercise capacity. Reported lower sodium intake. Reported increased fruit  and vegetable intake. Reports medication compliance.;Short term: Daily weights obtained and reported for increase. Utilizing diuretic protocols set by physician.;Long term: Adoption of self-care skills and reduction of barriers for early signs and symptoms recognition and intervention leading to self-care maintenance.    Hypertension Yes    Intervention Provide education on lifestyle modifcations including regular physical activity/exercise, weight management, moderate sodium restriction and increased consumption of fresh fruit, vegetables, and low fat dairy, alcohol moderation, and smoking cessation.;Monitor prescription use compliance.    Expected Outcomes Short Term: Continued assessment and intervention until BP is < 140/33m HG in hypertensive participants. < 130/855mHG in hypertensive participants with diabetes, heart failure or chronic kidney disease.;Long Term: Maintenance of blood pressure at goal levels.    Lipids Yes    Intervention Provide education and support for participant on nutrition & aerobic/resistive exercise along with prescribed medications to achieve LDL '70mg'$ , HDL >'40mg'$ .    Expected Outcomes Short Term: Participant states understanding of desired cholesterol values and is compliant with medications prescribed. Participant is  following exercise prescription and nutrition guidelines.;Long Term: Cholesterol controlled with medications as prescribed, with individualized exercise RX and with personalized nutrition plan. Value goals: LDL < '70mg'$ , HDL > 40 mg.    Stress Yes    Intervention Offer individual and/or small group education and counseling on adjustment to heart disease, stress management and health-related lifestyle change. Teach and support self-help strategies.;Refer participants experiencing significant psychosocial distress to appropriate mental health specialists for further evaluation and treatment. When possible, include family members and significant others in education/counseling sessions.    Expected Outcomes Short Term: Participant demonstrates changes in health-related behavior, relaxation and other stress management skills, ability to obtain effective social support, and compliance with psychotropic medications if prescribed.;Long Term: Emotional wellbeing is indicated by absence of clinically significant psychosocial distress or social isolation.    Personal Goal Other Yes    Personal Goal Short: ADL's easier Long: stamina, get back to normal life    Intervention Will continue to monitor pt and progress workloads as tolerated without sign or symptom    Expected Outcomes Pt will achieve his goals and gain strength             Core Components/Risk Factors/Patient Goals Review:   Goals and Risk Factor Review     Row Name 07/22/22 1600 08/06/22 1543 09/04/22 1648         Core Components/Risk Factors/Patient Goals Review   Personal Goals Review Weight Management/Obesity;Heart Failure;Stress;Lipids;Hypertension;Tobacco Cessation Weight Management/Obesity;Heart Failure;Stress;Lipids;Hypertension;Tobacco Cessation Weight Management/Obesity;Heart Failure;Stress;Lipids;Hypertension;Tobacco Cessation     Review MiRonalee Beltstarted intensive cardiac rehab on 07/22/22 and did well with exercise. Vital signs were  stable MiRonalee Beltss off to a good start to exercise at intensive cardiac rehab . Vital signs were stable. Continues to smoke has no intention of quitting currently. MiRonalee Beltss doing well with exercise at intensive cardiac rehab . Vital signs have been  stable. Continues to smoke has no intention of quitting currently. MiRonalee Beltsill complete cardiac rehab on 09/13/22     Expected Outcomes MiRonalee Beltsill continue to participate in intensive cardiac rehab foe exercise, nutrition and lifestyle modifications. Will continue to encourage smoking cessation MiRonalee Beltsill continue to participate in intensive cardiac rehab foe exercise, nutrition and lifestyle modifications. MiRonalee Beltsill continue to participate in intensive cardiac rehab foe exercise, nutrition and lifestyle modifications.              Core Components/Risk Factors/Patient Goals at Discharge (Final Review):   Goals and Risk Factor  Review - 09/04/22 1648       Core Components/Risk Factors/Patient Goals Review   Personal Goals Review Weight Management/Obesity;Heart Failure;Stress;Lipids;Hypertension;Tobacco Cessation    Review Ronalee Belts is doing well with exercise at intensive cardiac rehab . Vital signs have been  stable. Continues to smoke has no intention of quitting currently. Ronalee Belts will complete cardiac rehab on 09/13/22    Expected Outcomes Ronalee Belts will continue to participate in intensive cardiac rehab foe exercise, nutrition and lifestyle modifications.             ITP Comments:  ITP Comments     Row Name 07/18/22 1548 07/22/22 1539 08/06/22 1541 09/04/22 1644     ITP Comments Dr Fransico Him MD, Medical Direcotr, Introduction to Pritikin Education Program/ Intensive Cardiac rehab. Initial Orientation packet Reviewed with the patient. 30 Day ITP Review. Ronalee Belts started intensive cardiac rehab on 07/22/22 and did well with exercise 30 Day ITP Review. Ronalee Belts has good attendance and participation in intensive cardiac rehab on 07/22/22 and is off to a good start to   exercise 30 Day ITP Review. Ronalee Belts has good attendance and participation in intensive cardiac rehab. Ronalee Belts will complete cardiac rehab on 09/13/22             Comments: See ITP comments.Harrell Gave RN BSN

## 2022-09-06 ENCOUNTER — Encounter (HOSPITAL_COMMUNITY)
Admission: RE | Admit: 2022-09-06 | Discharge: 2022-09-06 | Disposition: A | Discharge status: Home / Self Care | Payer: Commercial Managed Care - PPO | Source: Ambulatory Visit | Attending: Cardiology | Admitting: Cardiology

## 2022-09-06 DIAGNOSIS — I5022 Chronic systolic (congestive) heart failure: Secondary | ICD-10-CM | POA: Diagnosis not present

## 2022-09-06 DIAGNOSIS — Z955 Presence of coronary angioplasty implant and graft: Secondary | ICD-10-CM | POA: Insufficient documentation

## 2022-09-09 ENCOUNTER — Encounter (HOSPITAL_COMMUNITY)
Admission: RE | Admit: 2022-09-09 | Discharge: 2022-09-09 | Disposition: A | Discharge status: Home / Self Care | Payer: Commercial Managed Care - PPO | Source: Ambulatory Visit | Attending: Cardiology | Admitting: Cardiology

## 2022-09-09 DIAGNOSIS — Z955 Presence of coronary angioplasty implant and graft: Secondary | ICD-10-CM

## 2022-09-10 ENCOUNTER — Ambulatory Visit: Payer: Commercial Managed Care - PPO | Admitting: Psychologist

## 2022-09-11 ENCOUNTER — Encounter (HOSPITAL_COMMUNITY)
Admission: RE | Admit: 2022-09-11 | Discharge: 2022-09-11 | Disposition: A | Discharge status: Home / Self Care | Payer: Commercial Managed Care - PPO | Source: Ambulatory Visit | Attending: Cardiology | Admitting: Cardiology

## 2022-09-11 ENCOUNTER — Other Ambulatory Visit: Payer: Self-pay | Admitting: Cardiology

## 2022-09-11 DIAGNOSIS — Z955 Presence of coronary angioplasty implant and graft: Secondary | ICD-10-CM

## 2022-09-11 DIAGNOSIS — I5022 Chronic systolic (congestive) heart failure: Secondary | ICD-10-CM

## 2022-09-13 ENCOUNTER — Encounter (HOSPITAL_COMMUNITY)
Admission: RE | Admit: 2022-09-13 | Discharge: 2022-09-13 | Disposition: A | Discharge status: Home / Self Care | Payer: Commercial Managed Care - PPO | Source: Ambulatory Visit | Attending: Cardiology | Admitting: Cardiology

## 2022-09-13 DIAGNOSIS — Z955 Presence of coronary angioplasty implant and graft: Secondary | ICD-10-CM | POA: Diagnosis not present

## 2022-09-13 DIAGNOSIS — I5022 Chronic systolic (congestive) heart failure: Secondary | ICD-10-CM

## 2022-09-16 ENCOUNTER — Encounter (HOSPITAL_COMMUNITY): Payer: Commercial Managed Care - PPO

## 2022-09-18 ENCOUNTER — Encounter (HOSPITAL_COMMUNITY)
Admission: RE | Admit: 2022-09-18 | Discharge: 2022-09-18 | Disposition: A | Discharge status: Home / Self Care | Payer: Commercial Managed Care - PPO | Source: Ambulatory Visit | Attending: Cardiology | Admitting: Cardiology

## 2022-09-18 DIAGNOSIS — I5022 Chronic systolic (congestive) heart failure: Secondary | ICD-10-CM

## 2022-09-18 DIAGNOSIS — Z955 Presence of coronary angioplasty implant and graft: Secondary | ICD-10-CM

## 2022-09-18 NOTE — Progress Notes (Signed)
Incomplete Session Note  Patient Details  Name: Freemont Klus MRN: [id number] Date of Birth: 1961/02/06 Referring Provider:   Flowsheet Row INTENSIVE CARDIAC REHAB ORIENT from 07/18/2022 in [facility] [place]  Referring Provider Dr Oswaldo Milian, MD       Danny Lawless did not complete his rehab session.  Coltyn was noted to have a large swollen on the right side of his face and chin. Ronalee Belts reports having an abscessed tooth and is currently taking an antibiotic for this. Ronalee Belts said he did a teldoc consult. I advised Ronalee Belts not to exercise today. Ronalee Belts is going to try to get in contact with an emergency dentist. I advised Ronalee Belts not to return until he is feeling better and his swelling is resolved. Patient states understanding.Harrell Gave RN BSN

## 2022-09-20 ENCOUNTER — Encounter (HOSPITAL_COMMUNITY): Payer: Commercial Managed Care - PPO

## 2022-09-20 ENCOUNTER — Telehealth (HOSPITAL_COMMUNITY): Payer: Self-pay | Admitting: *Deleted

## 2022-09-20 NOTE — Telephone Encounter (Signed)
Spoke with the patient he is on antibiotic's and is going see an Chief Financial Officer on next Thursday. Will cancel appointments until next Friday. Patient would like to make up the missed classes if possible.Barnet Pall, RN,BSN 09/20/2022 9:03 AM

## 2022-09-23 ENCOUNTER — Encounter (HOSPITAL_COMMUNITY): Payer: Commercial Managed Care - PPO

## 2022-09-24 ENCOUNTER — Ambulatory Visit (INDEPENDENT_AMBULATORY_CARE_PROVIDER_SITE_OTHER): Payer: Commercial Managed Care - PPO | Admitting: Psychologist

## 2022-09-24 DIAGNOSIS — F411 Generalized anxiety disorder: Secondary | ICD-10-CM | POA: Diagnosis not present

## 2022-09-24 NOTE — Progress Notes (Signed)
[facility] Counselor/Therapist Progress Note  Patient ID: Christopher Gutierrez, MRN: [id number],    Date: 09/24/2022  Time Spent: 11:04 am to 11:44 am; total time: 40 minutes   This session was held via in person. The patient consented to in-person therapy and was in the clinician's office. Limits of confidentiality were discussed with the patient.   Treatment Type: Individual Therapy  Reported Symptoms: Less depressed  Mental Status Exam: Appearance:  Well Groomed     Behavior: Appropriate  Motor: Normal  Speech/Language:  Slow  Affect: Flat  Mood: constricted  Thought process: normal  Thought content:   WNL  Sensory/Perceptual disturbances:   WNL  Orientation: oriented to person, place, and time/date  Attention: Good  Concentration: Good  Memory: WNL  Fund of knowledge:  Good  Insight:   Poor  Judgment:  Fair  Impulse Control: Good   Risk Assessment: Danger to Self:  No Self-injurious Behavior: No Danger to Others: No Duty to Warn:no Physical Aggression / Violence:No  Access to Firearms a concern: No  Gang Involvement:No   Subjective: Beginning the session, patient stated that he fees like he is doing better. Elaborating, he voiced that he was being physically active before developing an abscess tooth. He is waiting to have it removed. From there, he stated that his mindset has improved. Elaborating, he stated that breaking tasks down, has helped him. He explored what tasks he wants to break down and stated he wants to work towards cleaning his room, coin collection, and working on computers. Patient spent time reflecting on different ways to break down each of these tasks. He was agreeable to the homework and following up. He denied suicidal and homicidal ideation.    Interventions:  Worked on developing a therapeutic relationship with the patient using active listening and reflective statements. Provided emotional support using empathy and validation.  Reviewed the treatment plan with the patient. Praised the patient for doing better and implementing ways to be more physically active into life. Normalized and validated thoughts related to being active. Processed thoughts and emotions. Identified goals for the session. Reflected on patient's growth in counseling and what patient has learned about self. Identified what has helped the patient. Identified the theme of breaking tasks down. Explored ways to break tasks down to complete at home. Used socratic questions to assist the patient. Challenged some of the thoughts expressed. Assisted in problem solving. Redirected the conversation. Reflected on how the therapeutic relationship assisted the patient.  Assigned homework. Provided empathic statements.Assessed for suicidal and homicidal ideation.   Homework: Work on breaking room into sections to focus on cleaning  Next Session: Review homework and emotional support.   Diagnosis: F41.1 generalized anxiety disorder  Plan:  Goals Work through the grieving process and face reality of own death Accept emotional support from others around them Live life to the fullest, event though time may be limited Become as knowledgeable about the medical condition  Reduce fear, anxiety about the health condition  Accept the illness Accept the role of psychological and behavioral factors  Stabilize anxiety level wile increasing ability to function Learn and implement coping skills that result in a reduction of anxiety  Alleviate depressive symptoms Recognize, accept, and cope with depressive feelings Develop healthy thinking patterns Develop healthy interpersonal relationships  Objectives target date for all objectives is 03/20/2023 Identify feelings associated with the illness Family members share with each other feelings Identify the losses or limitations that have been experienced Verbalize acceptance of the  reality of the medical condition Commit to  learning and implement a proactive approach to managing personal stresses Verbalize an understanding of the medical condition Work with therapist to develop a plan for coping with stress Learn and implement skills for managing stress Engage in social, productive activities that are possible Engage in faith based activities implement positive imagery Identify coping skills and sources of emotional support Patient's partner and family members verbalize their fears regarding severity of health condition Identify sources of emotional distress  Learning and implement calming skills to reduce overall anxiety Learn and implement problem solving strategies Identify and engage in pleasant activities Learning and implement personal and interpersonal skills to reduce anxiety and improve interpersonal relationships Learn to accept limitations in life and commit to tolerating, rather than avoiding, unpleasant emotions while accomplishing meaningful goals Identify major life conflicts from the past and present that form the basis for present anxiety Learn and implement behavioral strategies Verbalize an understanding and resolution of current interpersonal problems Learn and implement problem solving and decision making skills Learn and implement conflict resolution skills to resolve interpersonal problems Verbalize an understanding of healthy and unhealthy emotions verbalize insight into how past relationships may be influence current experiences with depression Use mindfulness and acceptance strategies and increase value based behavior  Increase hopeful statements about the future.   Interventions Teach about stress and ways to handle stress Assist the patient in developing a coping action plan for stressors Conduct skills based training for coping strategies Train problem focused skills Sort out what activities the individual can do Encourage patient to rely upon his/her spiritual faith Teach the  patient to use guided imagery Probe and evaluate family's ability to provide emotional support Allow family to share their fears Assist the patient in identifying, sorting through, and verbalizing the various feelings generated by his/her medical condition Meet with family members  Ask patient list out limitations  Use stress inoculation training  Use Acceptance and Commitment Therapy to help client accept uncomfortable realities in order to accomplish value-consistent goals Reinforce the client's insight into the role of his/her past emotional pain and present anxiety  Discuss examples demonstrating that unrealistic worry overestimates the probability of threats and underestimate patient's ability  Assist the patient in analyzing his or her worries Help patient understand that avoidance is reinforcing  Behavioral activation help the client explore the relationship, nature of the dispute,  Help the client develop new interpersonal skills and relationships Conduct Problem so living therapy Teach conflict resolution skills Use a process-experiential approach Conduct TLDP Conduct ACT  The patient and clinician reviewed the treatment plan on 04/24/2022. The patient approved of the treatment plan.   Conception Chancy, PsyD

## 2022-09-25 ENCOUNTER — Encounter (HOSPITAL_COMMUNITY): Payer: Commercial Managed Care - PPO

## 2022-09-27 ENCOUNTER — Encounter (HOSPITAL_COMMUNITY): Payer: Commercial Managed Care - PPO

## 2022-09-30 ENCOUNTER — Telehealth: Payer: Self-pay | Admitting: Cardiology

## 2022-09-30 NOTE — Telephone Encounter (Signed)
I called the [facility] office and s/w Ubaldo Glassing, who was able to provide me the information by phone. Ubaldo Glassing said they will need their clearance form sent back to them. I explained that our process and format for our cardiologist has been in placed for over 5+ yrs. We have 7 satellite office locations and if needing to have a form filled out may cause delays in pt as care, as the cardiologist are not in the office everyday. I explained that all information that is being requested will be addressed in the clearance notes that we will send back.      Pre-operative Risk Assessment    Patient Name: Sylvesta Kottman  DOB: 03/03/1961 MRN: [id number]     Request for Surgical Clearance    Procedure:  Dental Extraction - Amount of Teeth to be Pulled:  1 TOOTH SURGICALLY EXTRACTED  Date of Surgery:  Clearance TBD                                Surgeon:  DR. Lannie Fields, DDS Surgeon's Group or Practice Name:  [facility]  Phone number:  [phone] Fax number:  [phone]   Type of Clearance Requested:   - Medical  - Pharmacy:  Hold Clopidogrel (Plavix) and Apixaban (Eliquis) ; WILL PT NEED SBE?   Type of Anesthesia:  Local W/EPI POSSIBLY AS WELL AS NITROUS OXIDE   Additional requests/questions:    Jiles Prows   09/30/2022, 2:10 PM

## 2022-09-30 NOTE — Telephone Encounter (Signed)
   Patient Name: Christopher Gutierrez  DOB: 1960/12/31 MRN: [id number]  Primary Cardiologist: [name], MD  Chart reviewed as part of pre-operative protocol coverage. Simple dental extractions (i.e. 1-2 teeth) are considered low risk procedures per guidelines and generally do not require any specific cardiac clearance. It is also generally accepted that for simple extractions and dental cleanings, there is no need to interrupt blood thinner therapy.  SBE prophylaxis is not required for the patient from a cardiac standpoint.  I will route this recommendation to the requesting party via Epic fax function and remove from pre-op pool.  Please call with questions.  Darreld Mclean, PA-C 09/30/2022, 2:45 PM

## 2022-09-30 NOTE — Telephone Encounter (Signed)
Follow Up:    Christopher Gutierrez is calling back to check on the status of clearance that was sent on 09-24-22.

## 2022-10-01 ENCOUNTER — Telehealth (HOSPITAL_COMMUNITY): Payer: Self-pay | Admitting: *Deleted

## 2022-10-01 NOTE — Telephone Encounter (Signed)
Left message to call cardiac rehab.Ebony Yorio Walden Vega Withrow RN BSN  

## 2022-10-01 NOTE — Progress Notes (Signed)
Cardiac Individual Treatment Plan  Patient Details  Name: Christopher Gutierrez MRN: [id number] Date of Birth: Mar 05, 1961 Referring Provider:   Flowsheet Row INTENSIVE CARDIAC REHAB ORIENT from 07/18/2022 in [facility] for Heart, Vascular, & [place]  Referring Provider Dr Oswaldo Milian, MD       Initial Encounter Date:  [place] from 07/18/2022 in [facility] for Heart, Vascular, & Lung Health  Date 07/18/22       Visit Diagnosis: Heart failure, chronic systolic ([place])  [id number] S/P DES RCA  Patient's Home Medications on Admission:  Current Outpatient Medications:    acetaminophen (TYLENOL) 500 MG tablet, Take 500 mg by mouth every 6 (six) hours as needed for moderate pain., Disp: , Rfl:    apixaban (ELIQUIS) 5 MG TABS tablet, TAKE 1 TABLET BY MOUTH TWICE A DAY, Disp: 180 tablet, Rfl: 2   bisoprolol (ZEBETA) 10 MG tablet, Take 1 tablet (10 mg total) by mouth daily., Disp: 90 tablet, Rfl: 3   cetirizine (ZYRTEC) 10 MG tablet, Take 10 mg by mouth as needed for allergies., Disp: , Rfl:    clopidogrel (PLAVIX) 75 MG tablet, TAKE 1 TABLET BY MOUTH EVERY DAY, Disp: 90 tablet, Rfl: 3   dapagliflozin propanediol (FARXIGA) 10 MG TABS tablet, Take 1 tablet (10 mg total) by mouth daily before breakfast. NEEDS FOLLOW UP APPOINTMENT FOR ANYMORE REFILLS, Disp: 30 tablet, Rfl: 3   ENTRESTO 97-103 MG, TAKE 1 TABLET BY MOUTH TWICE A DAY, Disp: 60 tablet, Rfl: 6   ezetimibe (ZETIA) 10 MG tablet, Take 1 tablet (10 mg total) by mouth daily., Disp: 90 tablet, Rfl: 3   famotidine-calcium carbonate-magnesium hydroxide (PEPCID COMPLETE) 10-800-165 MG chewable tablet, Chew 1 tablet by mouth daily as needed (acid reflux)., Disp: , Rfl:    hydrALAZINE (APRESOLINE) 25 MG tablet, TAKE 1 TABLET BY MOUTH IN THE MORNING AND AT BEDTIME, Disp: 180 tablet, Rfl: 0   nitroGLYCERIN (NITROSTAT) 0.4 MG SL tablet, Place 1  tablet (0.4 mg total) under the tongue every 5 (five) minutes as needed., Disp: 25 tablet, Rfl: 3   Polyvinyl Alcohol-Povidone (REFRESH OP), Place 1 drop into both eyes daily as needed (dry eyes)., Disp: , Rfl:    rosuvastatin (CRESTOR) 40 MG tablet, Take 1 tablet (40 mg total) by mouth daily. 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   Hyperlipidemia    Hypertension     Tobacco Use: Social History   Tobacco Use  Smoking Status Every Day   Packs/day: 1.50   Years: 25.00   Total pack years: 37.50   Types: Cigarettes  Smokeless Tobacco Current   Types: Chew  Tobacco Comments   07/18/22 Smokes 1/2 ppd not ready to quit given smoking cessation information    Labs: Review Flowsheet  More data exists      Latest Ref Rng & Units 10/31/2020 01/26/2021 11/22/2021 01/16/2022 04/25/2022  Labs for ITP  Cardiac and Pulmonary Rehab  Cholestrol 100 - 199 mg/dL 153  229  181  185  143   LDL (calc) 0 - 99 mg/dL 103  139  95  95  59   HDL-C >39 mg/dL 36  69  65  72  65   Trlycerides 0 - 149 mg/dL 68  105  119  99  102     Capillary Blood Glucose: No results found for: "GLUCAP"   Exercise Target Goals: Exercise Program Goal: Individual exercise prescription set using results from initial 6 min walk test and THRR while considering  patient's activity barriers and safety.   Exercise Prescription Goal: Initial exercise prescription builds to 30-45 minutes a day of aerobic activity, 2-3 days per week.   Home exercise guidelines will be given to patient during program as part of exercise prescription that the participant will acknowledge.  Activity Barriers & Risk Stratification:  Activity Barriers & Cardiac Risk Stratification - 07/18/22 1451       Activity Barriers & Cardiac Risk Stratification   Activity Barriers Deconditioning;Muscular Weakness;Back Problems;Balance Concerns    Cardiac Risk Stratification High             6 Minute Walk:  6 Minute Walk     Row Name 07/18/22 1445         6 Minute Walk   Phase Initial     Distance 1320 feet     Walk Time 6 minutes     # of Rest Breaks 0     MPH 2.5     METS 3.47     RPE 14     Perceived Dyspnea  0     VO2 Peak 12.14     Symptoms Yes (comment)     Comments Bilateral calf tightness/pain 3/10 resolved with rest     Resting HR 55 bpm     Resting BP 118/62     Resting Oxygen Saturation  97 %     Exercise Oxygen Saturation  during 6 min walk 98 %     Max Ex. HR 80 bpm     Max Ex. BP 130/74     2 Minute Post BP 122/74              Oxygen Initial Assessment:   Oxygen Re-Evaluation:   Oxygen Discharge (Final Oxygen Re-Evaluation):   Initial Exercise Prescription:  Initial Exercise Prescription - 07/18/22 1400       Date of Initial Exercise RX and Referring Provider   Date 07/18/22    Referring Provider Dr Oswaldo Milian, MD    Expected Discharge Date 09/13/21      NuStep   Level 2    Minutes 15    METs 1.8      Arm Ergometer   Level 2    Minutes 15    METs 1.8      Prescription Details   Frequency (times per week) 3    Duration Progress to 30 minutes of continuous aerobic without signs/symptoms of physical distress      Intensity   THRR 40-80% of Max Heartrate 64-129    Ratings of Perceived Exertion 11-13    Perceived Dyspnea 0-4      Progression   Progression Continue progressive overload as per policy without signs/symptoms or physical distress.      Resistance Training    Training Prescription Yes    Weight 3 lbs wts    Reps 10-15  Perform Capillary Blood Glucose checks as needed.  Exercise Prescription Changes:   Exercise Prescription Changes     Row Name 07/22/22 1626 08/21/22 1620 09/06/22 1642         Response to Exercise   Blood Pressure (Admit) 114/80 108/70 124/84     Blood Pressure (Exercise) 142/84 132/62 120/72     Blood Pressure (Exit) 144/80 110/74 106/64     Heart Rate (Admit) 59 bpm 62 bpm 58 bpm     Heart Rate (Exercise) 78 bpm 75 bpm 80 bpm     Heart Rate (Exit) 67 bpm 58 bpm 61 bpm     Rating of Perceived Exertion (Exercise) 11.75 11 11.5     Perceived Dyspnea (Exercise) 0 0 0     Symptoms 0 0 0     Comments Pt first day in the CRP2 program Reviewed MET's, goals and home ExRx Reviewed MET's     Duration Progress to 30 minutes of  aerobic without signs/symptoms of physical distress Progress to 30 minutes of  aerobic without signs/symptoms of physical distress Progress to 30 minutes of  aerobic without signs/symptoms of physical distress     Intensity THRR unchanged THRR unchanged THRR unchanged       Progression   Progression Continue to progress workloads to maintain intensity without signs/symptoms of physical distress. Continue to progress workloads to maintain intensity without signs/symptoms of physical distress. Continue to progress workloads to maintain intensity without signs/symptoms of physical distress.     Average METs 1.75 1.8 1.8       Resistance Training   Training Prescription Yes No Yes     Weight 3 lbs wts -- 3 lbs wts     Reps 10-15 -- 10-15     Time 10 Minutes -- 10 Minutes       Arm Ergometer   Level '2 2 2     '$ Watts -- 9 --     Minutes '15 15 15     '$ METs 1.6 1.8 1.9       T5 Nustep   Level '2 2 2     '$ SPM 67 -- 73     Minutes '15 15 15     '$ METs 1.9 --  Pt forgot to report MET's 1.9       Home Exercise Plan   Plans to continue exercise at -- Home (comment) Home (comment)     Frequency  -- Add 2 additional days to program exercise sessions. Add 2 additional days to program exercise sessions.     Initial Home Exercises Provided -- 08/21/21 08/21/21              Exercise Comments:   Exercise Comments     Row Name 07/22/22 1631 08/19/22 1645 08/21/22 1627 09/06/22 1645 09/23/22 1654   Exercise Comments Pt first day in the CRP2 program. Pt tolerated exercise well with an average MET level of 1.75. Pt is learning his THRR, RPE and ExRx. Will continue to progress pt as tolerated without sign or symptom Pt absent since 08/09/22 due to eye surgery. Will review education upon pt return. Reviewed MET's, goals and home ExRx. Pt tolerated exercise well with an average MET level of 1.8. Pt feels better about his goals, but felt he had a little set back with his eye surgery. He is ready to get back started now that the restrictions are lifted. Pt will add in exercise by doing Youtube chair fitness and Tai chi classes 1-2 days a  week or 30 mins as tolerated by fatigue Reviewed MET's with Pt today. Pt tolerated exercise well with an average MET level of 1.9. Continued to encourage increase in SPM, we have tried increasing resistance, but now encourageing a faster pace. Will continue to monitor Pt has been absent due to dental issues. Will review education upon pt return            Exercise Goals and Review:   Exercise Goals     Row Name 07/18/22 1454             Exercise Goals   Increase Physical Activity Yes       Intervention Provide advice, education, support and counseling about physical activity/exercise needs.;Develop an individualized exercise prescription for aerobic and resistive training based on initial evaluation findings, risk stratification, comorbidities and participant's personal goals.       Expected Outcomes Short Term: Attend rehab on a regular basis to increase amount of physical activity.;Long Term: Exercising regularly at least 3-5 days a week.;Long Term: Add  in home exercise to make exercise part of routine and to increase amount of physical activity.       Increase Strength and Stamina Yes       Intervention Provide advice, education, support and counseling about physical activity/exercise needs.;Develop an individualized exercise prescription for aerobic and resistive training based on initial evaluation findings, risk stratification, comorbidities and participant's personal goals.       Expected Outcomes Short Term: Increase workloads from initial exercise prescription for resistance, speed, and METs.;Short Term: Perform resistance training exercises routinely during rehab and add in resistance training at home;Long Term: Improve cardiorespiratory fitness, muscular endurance and strength as measured by increased METs and functional capacity (6MWT)       Able to understand and use rate of perceived exertion (RPE) scale Yes       Intervention Provide education and explanation on how to use RPE scale       Expected Outcomes Short Term: Able to use RPE daily in rehab to express subjective intensity level;Long Term:  Able to use RPE to guide intensity level when exercising independently       Knowledge and understanding of Target Heart Rate Range (THRR) Yes       Intervention Provide education and explanation of THRR including how the numbers were predicted and where they are located for reference       Expected Outcomes Short Term: Able to state/look up THRR;Long Term: Able to use THRR to govern intensity when exercising independently;Short Term: Able to use daily as guideline for intensity in rehab       Understanding of Exercise Prescription Yes       Intervention Provide education, explanation, and written materials on patient's individual exercise prescription       Expected Outcomes Short Term: Able to explain program exercise prescription;Long Term: Able to explain home exercise prescription to exercise independently                Exercise  Goals Re-Evaluation :  Exercise Goals Re-Evaluation     Row Name 07/22/22 1629 08/21/22 1624           Exercise Goal Re-Evaluation   Exercise Goals Review Increase Physical Activity;Understanding of Exercise Prescription;Increase Strength and Stamina;Knowledge and understanding of Target Heart Rate Range (THRR);Able to understand and use rate of perceived exertion (RPE) scale Increase Physical Activity;Understanding of Exercise Prescription;Increase Strength and Stamina;Knowledge and understanding of Target Heart Rate Range (THRR);Able to understand and use  rate of perceived exertion (RPE) scale      Comments Pt first day in the CRP2 program. Pt tolerated exercise well with an average MET level of 1.75. Pt is learning his THRR, RPE and ExRx. Reviewed MET's, goals and home ExRx. Pt tolerated exercise well with an average MET level of 1.8. Pt feels better about his goals, but felt he had a little set back with his eye surgery. He is ready to get back started now that the restrictions are lifted. Pt will add in exercise by doing Youtube chair fitness and Tai chi classes 1-2 days a week or 30 mins as tolerated by fatigue      Expected Outcomes Will continue to progress pt as tolerated without sign or symptom Will continue to progress pt as tolerated without sign or symptom. Pt will add in exercise and increase strength and stamina               Discharge Exercise Prescription (Final Exercise Prescription Changes):  Exercise Prescription Changes - 09/06/22 1642       Response to Exercise   Blood Pressure (Admit) 124/84    Blood Pressure (Exercise) 120/72    Blood Pressure (Exit) 106/64    Heart Rate (Admit) 58 bpm    Heart Rate (Exercise) 80 bpm    Heart Rate (Exit) 61 bpm    Rating of Perceived Exertion (Exercise) 11.5    Perceived Dyspnea (Exercise) 0    Symptoms 0    Comments Reviewed MET's    Duration Progress to 30 minutes of  aerobic without signs/symptoms of physical distress     Intensity THRR unchanged      Progression   Progression Continue to progress workloads to maintain intensity without signs/symptoms of physical distress.    Average METs 1.8      Resistance Training   Training Prescription Yes    Weight 3 lbs wts    Reps 10-15    Time 10 Minutes      Arm Ergometer   Level 2    Minutes 15    METs 1.9      T5 Nustep   Level 2    SPM 73    Minutes 15    METs 1.9      Home Exercise Plan   Plans to continue exercise at Home (comment)    Frequency Add 2 additional days to program exercise sessions.    Initial Home Exercises Provided 08/21/21             Nutrition:  Target Goals: Understanding of nutrition guidelines, daily intake of sodium '1500mg'$ , cholesterol '200mg'$ , calories 30% from fat and 7% or less from saturated fats, daily to have 5 or more servings of fruits and vegetables.  Biometrics:  Pre Biometrics - 07/18/22 1443       Pre Biometrics   Waist Circumference 44 inches    Hip Circumference 43.5 inches    Waist to Hip Ratio 1.01 %    Triceps Skinfold 14 mm    % Body Fat 28.1 %    Grip Strength 45 kg    Flexibility --   Pt unable to reach   Single Leg Stand 13.7 seconds              Nutrition Therapy Plan and Nutrition Goals:  Nutrition Therapy & Goals - 09/20/22 1552       Nutrition Therapy   Diet Heart Healthy Diet    Drug/Food Interactions Statins/Certain Fruits  Personal Nutrition Goals   Nutrition Goal Patient to identify strategies for reducing cardiovascular risk by attending the [employer] education and nutrition series    Personal Goal #2 Patient to improve diet quality by using the plate method as a guide for meal planning to including lean protein/plant protein, fruits, vegetables, whole gains and nonfat dairy.    Personal Goal #3 Patient to reduce sodium intake to '1500mg'$  per day    Comments Goals in contemplative stage. Christopher Gutierrez remains motivated to make dietary changes and continues to attend the  [employer] education series regularly. He has missed classes this week and will miss classes next week due to dental issue.Christopher Gutierrez admits being to slow to adopt/implement changes; however, he has a plan to begin cooking more at home and reduce convenience foods/takeout foods. He does continue regular follow-up with Conception Chancy, [facility] for behavioral health counseling. Christopher Gutierrez will continue to benefit from participation in intensive cardiac rehab for nutrition, exercise, and lifestyle modification.      Intervention Plan   Intervention Prescribe, educate and counsel regarding individualized specific dietary modifications aiming towards targeted core components such as weight, hypertension, lipid management, diabetes, heart failure and other comorbidities.;Nutrition handout(s) given to patient.    Expected Outcomes Short Term Goal: Understand basic principles of dietary content, such as calories, fat, sodium, cholesterol and nutrients.;Long Term Goal: Adherence to prescribed nutrition plan.             Nutrition Assessments:  Nutrition Assessments - 07/23/22 0921       Rate Your Plate Scores   Pre Score 56            MEDIFICTS Score Key: ?70 Need to make dietary changes  40-70 Heart Healthy Diet ? 40 Therapeutic Level Cholesterol Diet   Flowsheet Row INTENSIVE CARDIAC REHAB from 07/22/2022 in [facility] for Heart, Vascular, & Lung Health  Picture Your Plate Total Score on Admission 56      Picture Your Plate Scores: [id number] Unhealthy dietary pattern with much room for improvement. 41-50 Dietary pattern unlikely to meet recommendations for good health and room for improvement. 51-60 More healthful dietary pattern, with some room for improvement.  >60 Healthy dietary pattern, although there may be some specific behaviors that could be improved.    Nutrition Goals Re-Evaluation:  Nutrition Goals Re-Evaluation     [place] Name 07/22/22 1625 08/21/22 1627 09/20/22  1552         Goals   Current Weight 212 lb 8.4 oz (96.4 kg) 212 lb 11.9 oz (96.5 kg) 212 lb 11.9 oz (96.5 kg)  wt from last attended session on 09/13/22     Comment lipids WNL, GFR 53, Cr 1.51 No new labs at this time. No new labs at this time. Most recent labs show lipids WNL, GFR 53, Cr 1.51     Expected Outcome Christopher Gutierrez reports enjoying a variety of foods and eating 2-3 meals per day. He reports drinking kool aid daily and 4 regular beers multiple times per week. 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 He does enjoy cooking and is looking forward to the [employer] nutrition series. Christopher Gutierrez will benefit from attending [facility] ICR and adherance to the [employer] eating plan to support improved diet quality, sodium intake '1500mg'$ /day, LDL <70, and HDL >40. Goals in progress. Christopher Gutierrez remains motivated to make dietary changes and continues to attend the [employer] education series regularly. He has a plan to begin cooking more at home and reduce convenience foods/takeout foods.  He does continue regular follow-up with Conception Chancy, [facility] for behavioral health counseling. Christopher Gutierrez will continue to benefit from participation in intensive cardiac rehab for nutrition, exercise, and lifestyle modification. Goals in contemplative stage. Christopher Gutierrez remains motivated to make dietary changes and continues to attend the [employer] education series regularly. He has missed classes this week and will miss classes next week due to dental issue.Christopher Gutierrez admits being to slow to adopt/implement changes; however, he has a plan to begin cooking more at home and reduce convenience foods/takeout foods. He does continue regular follow-up with Conception Chancy, [facility] for behavioral health counseling. Christopher Gutierrez will continue to benefit from participation in intensive cardiac rehab for nutrition, exercise, and lifestyle modification.              Nutrition Goals Re-Evaluation:  Nutrition Goals Re-Evaluation     [place] Name 07/22/22 1625 08/21/22 1627  09/20/22 1552         Goals   Current Weight 212 lb 8.4 oz (96.4 kg) 212 lb 11.9 oz (96.5 kg) 212 lb 11.9 oz (96.5 kg)  wt from last attended session on 09/13/22     Comment lipids WNL, GFR 53, Cr 1.51 No new labs at this time. No new labs at this time. Most recent labs show lipids WNL, GFR 53, Cr 1.51     Expected Outcome Christopher Gutierrez reports enjoying a variety of foods and eating 2-3 meals per day. He reports drinking kool aid daily and 4 regular beers multiple times per week. 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 He does enjoy cooking and is looking forward to the [employer] nutrition series. Christopher Gutierrez will benefit from attending [facility] ICR and adherance to the [employer] eating plan to support improved diet quality, sodium intake '1500mg'$ /day, LDL <70, and HDL >40. Goals in progress. Christopher Gutierrez remains motivated to make dietary changes and continues to attend the [employer] education series regularly. He has a plan to begin cooking more at home and reduce convenience foods/takeout foods. He does continue regular follow-up with Conception Chancy, [facility] for behavioral health counseling. Christopher Gutierrez will continue to benefit from participation in intensive cardiac rehab for nutrition, exercise, and lifestyle modification. Goals in contemplative stage. Christopher Gutierrez remains motivated to make dietary changes and continues to attend the [employer] education series regularly. He has missed classes this week and will miss classes next week due to dental issue.Christopher Gutierrez admits being to slow to adopt/implement changes; however, he has a plan to begin cooking more at home and reduce convenience foods/takeout foods. He does continue regular follow-up with Conception Chancy, [facility] for behavioral health counseling. Christopher Gutierrez will continue to benefit from participation in intensive cardiac rehab for nutrition, exercise, and lifestyle modification.              Nutrition Goals Discharge (Final Nutrition Goals Re-Evaluation):  Nutrition Goals Re-Evaluation -  09/20/22 1552       Goals   Current Weight 212 lb 11.9 oz (96.5 kg)   wt from last attended session on 09/13/22   Comment No new labs at this time. Most recent labs show lipids WNL, GFR 53, Cr 1.51    Expected Outcome Goals in contemplative stage. Christopher Gutierrez remains motivated to make dietary changes and continues to attend the [employer] education series regularly. He has missed classes this week and will miss classes next week due to dental issue.Christopher Gutierrez admits being to slow to adopt/implement changes; however, he has a plan to begin cooking more at home and reduce convenience foods/takeout foods. He does continue regular  follow-up with Conception Chancy, [facility] for behavioral health counseling. Christopher Gutierrez will continue to benefit from participation in intensive cardiac rehab for nutrition, exercise, and lifestyle modification.             Psychosocial: Target Goals: Acknowledge presence or absence of significant depression and/or stress, maximize coping skills, provide positive support system. Participant is able to verbalize types and ability to use techniques and skills needed for reducing stress and depression.  Initial Review & Psychosocial Screening:  Initial Psych Review & Screening - 07/22/22 1540       Initial Review   Current issues with Current Stress Concerns    Source of Stress Concerns Chronic Illness;Occupation;Unable to participate in former interests or hobbies;Family;Unable to perform yard/household activities;Financial    Comments will continue to monitor and offer support as needed. Reviewed quality of life questionnaire on 07/24/22 has many life stressors is receiving counselling      Family Dynamics   Good Support System? No    Strains Illness and family care strain    Concerns Inappropriate over/under dependence on family/friends      Barriers   Psychosocial barriers to participate in program The patient should benefit from training in stress management and relaxation.       Screening Interventions   Interventions Encouraged to exercise;To provide support and resources with identified psychosocial needs;Provide feedback about the scores to participant    Expected Outcomes Long Term Goal: Stressors or current issues are controlled or eliminated.;Short Term goal: Utilizing psychosocial counselor, staff and physician to assist with identification of specific Stressors or current issues interfering with healing process. Setting desired goal for each stressor or current issue identified.;Short Term goal: Identification and review with participant of any Quality of Life or Depression concerns found by scoring the questionnaire.;Long Term goal: The participant improves quality of Life and PHQ9 Scores as seen by post scores and/or verbalization of changes             Quality of Life Scores:  Quality of Life - 07/18/22 1458       Quality of Life   Select Quality of Life      Quality of Life Scores   Health/Function Pre 12.37 %    Socioeconomic Pre 13.57 %    Psych/Spiritual Pre 13.14 %    Family Pre 18.5 %    GLOBAL Pre 13.53 %            Scores of 19 and below usually indicate a poorer quality of life in these areas.  A difference of  2-3 points is a clinically meaningful difference.  A difference of 2-3 points in the total score of the Quality of Life Index has been associated with significant improvement in overall quality of life, self-image, physical symptoms, and general health in studies assessing change in quality of life.  PHQ-9: Review Flowsheet  More data may exist      07/18/2022 07/13/2021 05/17/2021 10/12/2020 09/29/2017  Depression screen PHQ 2/9  Decreased Interest '1 1 1 1 '$ 0  Down, Depressed, Hopeless 0 0 2 1 0  PHQ - 2 Score '1 1 3 2 '$ 0  Altered sleeping - - 2 0 -  Tired, decreased energy - - 1 2 -  Change in appetite - - 0 0 -  Feeling bad or failure about yourself  - - 1 2 -  Trouble concentrating - - 1 2 -  Moving slowly or  fidgety/restless - - 1 2 -  Suicidal thoughts - -  0 0 -  PHQ-9 Score - - 9 10 -  Difficult doing work/chores - - Somewhat difficult Somewhat difficult -   Interpretation of Total Score  Total Score Depression Severity:  1-4 = Minimal depression, 5-9 = Mild depression, 10-14 = Moderate depression, 15-19 = Moderately severe depression, 20-27 = Severe depression   Psychosocial Evaluation and Intervention:   Psychosocial Re-Evaluation:  Psychosocial Re-Evaluation     [place] Name 07/22/22 1540 08/06/22 1542 08/06/22 1543 09/04/22 1646 10/01/22 1140     Psychosocial Re-Evaluation   Current issues with Current Stress Concerns Current Stress Concerns Current Stress Concerns Current Stress Concerns Current Stress Concerns   Comments No increased concerns or stressors voiced on his first day of exercise. Will review quality of life questionnaire in the upcoming week. No increased concerns or stressors voiced on his first day of exercise. Quality of life reviewed. Receives counseling from [place] [facility] No increased concerns or stressors voiced on his first day of exercise. Quality of life reviewed. Receives counseling from [place] has not voiced any increased concerns or stressors.  Receives counseling from [place] [facility]. Christopher Gutierrez says this is helpful Christopher Gutierrez has not voiced any increased concerns or stressors.  Receives counseling from [place] [facility]. Christopher Gutierrez says this is helpful. Catalina Antigua is currently out due to dental problem extraction   Expected Outcomes Christopher Gutierrez will have decreased  or better controlled stress upon completion of intensive cardiac rehab. Christopher Gutierrez will have decreased  or better controlled stress upon completion of intensive cardiac rehab. Christopher Gutierrez will have decreased  or better controlled stress upon completion of intensive cardiac rehab. Christopher Gutierrez will have decreased  or better controlled stress upon completion of intensive cardiac rehab. Christopher Gutierrez will have decreased  or better controlled  stress upon completion of intensive cardiac rehab.   Interventions Stress management education;Encouraged to attend Cardiac Rehabilitation for the exercise;Relaxation education Stress management education;Encouraged to attend Cardiac Rehabilitation for the exercise;Relaxation education Stress management education;Encouraged to attend Cardiac Rehabilitation for the exercise;Relaxation education Stress management education;Encouraged to attend Cardiac Rehabilitation for the exercise;Relaxation education Stress management education;Encouraged to attend Cardiac Rehabilitation for the exercise;Relaxation education   Continue Psychosocial Services  Follow up required by staff Follow up required by staff Follow up required by staff No Follow up required No Follow up required     Initial Review   Source of Stress Concerns -- Chronic Illness;Occupation;Unable to participate in former interests or hobbies;Family;Unable to perform yard/household activities;Financial Chronic Illness;Occupation;Unable to participate in former interests or hobbies;Family;Unable to perform yard/household activities;Financial Chronic Illness;Occupation;Unable to participate in former interests or hobbies;Family;Unable to perform yard/household activities;Financial Chronic Illness;Occupation;Unable to participate in former interests or hobbies;Family;Unable to perform yard/household activities;Financial   Comments -- will continue to monitor and offer support as needed. Reviewed quality of life questionnaire on 07/24/22 has many life stressors is receiving counselling will continue to monitor and offer support as needed. Reviewed quality of life questionnaire on 07/24/22 has many life stressors is receiving counselling will continue to monitor and offer support as needed. Reviewed quality of life questionnaire on 07/24/22 has many life stressors is receiving counselling will continue to monitor and offer support as needed. Reviewed quality of  life questionnaire on 07/24/22 has many life stressors is receiving counselling            Psychosocial Discharge (Final Psychosocial Re-Evaluation):  Psychosocial Re-Evaluation - 10/01/22 1140       Psychosocial Re-Evaluation   Current issues with Current Stress  Concerns    Comments Christopher Gutierrez has not voiced any increased concerns or stressors.  Receives counseling from [place] [facility]. Christopher Gutierrez says this is helpful. Catalina [place]igua is currently out due to dental problem extraction    Expected Outcomes Christopher Gutierrez will have decreased  or better controlled stress upon completion of intensive cardiac rehab.    Interventions Stress management education;Encouraged to attend Cardiac Rehabilitation for the exercise;Relaxation education    Continue Psychosocial Services  No Follow up required      Initial Review   Source of Stress Concerns Chronic Illness;Occupation;Unable to participate in former interests or hobbies;Family;Unable to perform yard/household activities;Financial    Comments will continue to monitor and offer support as needed. Reviewed quality of life questionnaire on 07/24/22 has many life stressors is receiving counselling             Vocational Rehabilitation: Provide vocational rehab assistance to qualifying candidates.   Vocational Rehab Evaluation & Intervention:  Vocational Rehab - 07/18/22 1558       Initial Vocational Rehab Evaluation & Intervention   Assessment shows need for Vocational Rehabilitation No   Christopher Gutierrez is not currently working as he was on long term disabilty he will let is know in January if he is interested in participating in Vocational Rehab            Education: Education Goals: Education classes will be provided on a weekly basis, covering required topics. Participant will state understanding/return demonstration of topics presented.    Education     Row Name 07/22/22 1500     Education   Cardiac Education Topics Pritikin   Clinical research associate Nutrition   Nutrition Workshop Fueling a Designer, multimedia   Instruction Review Code 1- Engineer, civil (consulting) Start Time 1300   Class Stop Time 1345   Class Time Calculation (min) 45 min    [place] Name 07/24/22 1500     Education   Cardiac Education Topics Pritikin   Financial trader   Weekly Topic International Cuisine- Spotlight on the [place]   Instruction Review Code 1- Verbalizes Understanding   Class Start Time 1355   Class Stop Time 1435   Class Time Calculation (min) 40 min    [place] Name 07/26/22 1500     Education   Cardiac Education Topics Pritikin   Select Workshops     Workshops   Educator Exercise Physiologist   Select Psychosocial   Psychosocial Workshop Recognizing and Reducing Stress   Instruction Review Code 1- Verbalizes Understanding   Class Start Time 1355   Class Stop Time 1445   Class Time Calculation (min) 50 min    [place] Name 07/31/22 1600     Education   Cardiac Education Topics Pritikin   Charity fundraiser Exercise Physiologist   Select Nutrition   Nutrition Cooking - Healthy Salads and Dressing   Instruction Review Code 1- Verbalizes Understanding   Class Start Time 1358   Class Stop Time 1442   Class Time Calculation (min) 44 min    [place] Name 08/02/22 1500     Education   Cardiac Education Topics Pritikin   Architect Education   General Education Heart Disease Risk Reduction   Instruction  Review Code 1- Verbalizes Understanding   Class Start Time 1400   Class Stop Time 1440   Class Time Calculation (min) 40 min    [place] Name 08/07/22 1500     Education   Cardiac Education Topics Pritikin   Financial trader   Weekly Topic Fast and Healthy Breakfasts   Instruction Review Code 1-  Verbalizes Understanding   Class Start Time 1355   Class Stop Time 1438   Class Time Calculation (min) 43 min    [place] Name 08/09/22 1500     Education   Cardiac Education Topics Pritikin   Lexicographer Nutrition   Nutrition Overview of the [place] Eating Plan   Instruction Review Code 1- Verbalizes Understanding   Class Start Time 1400   Class Stop Time 1440   Class Time Calculation (min) 40 min    [place] Name 08/22/22 1000     Education   Cardiac Education Topics [place]  completed 08/21/22     Cooking School   Educator Dietitian   Weekly Topic Delicious Desserts   Instruction Review Code 1- Information systems manager   Class Start Time 1400   Class Stop Time 1458   Class Time Calculation (min) 58 min    [place] Name 08/23/22 1600     Education   Cardiac Education Topics Pritikin   Lexicographer Nutrition   Nutrition Other  Label reading   Instruction Review Code 1- Verbalizes Understanding   Class Start Time 1400   Class Stop Time 1448   Class Time Calculation (min) 48 min    [place] Name 08/26/22 [place]   [employer]     Workshops   Educator Exercise Physiologist   Select Psychosocial   Psychosocial Workshop Other  Focused goals and sustainable changes   Instruction Review Code 1- Verbalizes Understanding   Class Start Time 1402   Class Stop Time 1445   Class Time Calculation (min) 43 min    [place] Name 08/28/22 1600     Education   Cardiac Education Topics [employer]   Educator Dietitian   Weekly Topic Tasty Appetizers and Snacks   Instruction Review Code 1- Verbalizes Understanding   Class Start Time 1402   Class Stop Time 1448   Class Time Calculation (min) 46 min    [place] Name 08/30/22 1500     Education   Cardiac Education Topics  Pritikin   Lexicographer Nutrition   Nutrition Calorie Density   Instruction Review Code 1- Verbalizes Understanding   Class Start Time 1400   Class Stop Time 1442   Class Time Calculation (min) 42 min    [place] Name 09/02/22 1500     Education   Cardiac Education Topics [place]   Select Workshops     Workshops   Educator Exercise Physiologist   Select Exercise   Exercise Workshop Exercise Basics: Building Your Action Plan   Instruction Review Code 1- Verbalizes Understanding   Class Start Time 1359   Class Stop Time 1445   Class Time Calculation (min) 46 min  [place] Name 09/06/22 1500     Education   Cardiac Education Topics Pritikin   Academic librarian Exercise Education   Exercise Education Move It!   Instruction Review Code 1- Verbalizes Understanding   Class Start Time 1405   Class Stop Time 1442   Class Time Calculation (min) 37 min    [place] Name 09/09/22 1500     Education   Cardiac Education Topics Pritikin   Select Core Videos     Core Videos   Educator Dietitian   Nutrition Nutrition Action Plan   Instruction Review Code 1- Verbalizes Understanding   Class Start Time 1400   Class Stop Time 1447   Class Time Calculation (min) 47 min    [place] Name 09/11/22 1500     Education   Cardiac Education Topics Pritikin   Financial trader   Weekly Topic One-Pot Wonders   Instruction Review Code 1- Verbalizes Understanding   Class Start Time 1400   Class Stop Time 1450   Class Time Calculation (min) 50 min    [place] Name 09/13/22 1600     Education   Cardiac Education Topics Pritikin   Architect Education   General Education Hypertension and Heart Disease   Instruction Review Code 1- Verbalizes Understanding   Class Start  Time 1410   Class Stop Time 1450   Class Time Calculation (min) 40 min            Core Videos: Exercise    Move It!  Clinical staff conducted group or individual video education with verbal and written material and guidebook.  Patient learns the recommended Pritikin exercise program. Exercise with the goal of living a long, healthy life. Some of the health benefits of exercise include controlled diabetes, healthier blood pressure levels, improved cholesterol levels, improved heart and lung capacity, improved sleep, and better body composition. Everyone should speak with their doctor before starting or changing an exercise routine.  Biomechanical Limitations Clinical staff conducted group or individual video education with verbal and written material and guidebook.  Patient learns how biomechanical limitations can impact exercise and how we can mitigate and possibly overcome limitations to have an impactful and balanced exercise routine.  Body Composition Clinical staff conducted group or individual video education with verbal and written material and guidebook.  Patient learns that body composition (ratio of muscle mass to fat mass) is a key component to assessing overall fitness, rather than body weight alone. Increased fat mass, especially visceral belly fat, can put [place] at increased risk for metabolic syndrome, type 2 diabetes, heart disease, and even death. It is recommended to combine diet and exercise (cardiovascular and resistance training) to improve your body composition. Seek guidance from your physician and exercise physiologist before implementing an exercise routine.  Exercise Action Plan Clinical staff conducted group or individual video education with verbal and written material and guidebook.  Patient learns the recommended strategies to achieve and enjoy long-term exercise adherence, including variety, self-motivation, self-efficacy, and positive decision making. Benefits of  exercise include fitness, good health, weight management, more energy, better sleep, less stress, and overall well-being.  Medical   Heart Disease Risk Reduction Clinical staff conducted group or individual video education with verbal and written material and guidebook.  Patient  learns our heart is our most vital organ as it circulates oxygen, nutrients, white blood cells, and hormones throughout the entire body, and carries waste away. Data supports a plant-based eating plan like the Pritikin Program for its effectiveness in slowing progression of and reversing heart disease. The video provides a number of recommendations to address heart disease.   Metabolic Syndrome and Belly Fat  Clinical staff conducted group or individual video education with verbal and written material and guidebook.  Patient learns what metabolic syndrome is, how it leads to heart disease, and how one can reverse it and keep it from coming back. You have metabolic syndrome if you have 3 of the following 5 criteria: abdominal obesity, high blood pressure, high triglycerides, low HDL cholesterol, and high blood sugar.  Hypertension and Heart Disease Clinical staff conducted group or individual video education with verbal and written material and guidebook.  Patient learns that high blood pressure, or hypertension, is very common in the [place]. Hypertension is largely due to excessive salt intake, but other important risk factors include being overweight, physical inactivity, drinking too much alcohol, smoking, and not eating enough potassium from fruits and vegetables. High blood pressure is a leading risk factor for heart attack, stroke, congestive heart failure, dementia, kidney failure, and premature death. Long-term effects of excessive salt intake include stiffening of the arteries and thickening of heart muscle and organ damage. Recommendations include ways to reduce hypertension and the risk of heart  disease.  Diseases of Our Time - Focusing on Diabetes Clinical staff conducted group or individual video education with verbal and written material and guidebook.  Patient learns why the best way to stop diseases of our time is prevention, through food and other lifestyle changes. Medicine (such as prescription pills and surgeries) is often only a Band-Aid on the problem, not a long-term solution. Most common diseases of our time include obesity, type 2 diabetes, hypertension, heart disease, and cancer. The Pritikin Program is recommended and has been proven to help reduce, reverse, and/or prevent the damaging effects of metabolic syndrome.  Nutrition   Overview of the Pritikin Eating Plan  Clinical staff conducted group or individual video education with verbal and written material and guidebook.  Patient learns about the [place] for disease risk reduction. The [place] emphasizes a wide variety of unrefined, minimally-processed carbohydrates, like fruits, vegetables, whole grains, and legumes. Go, Caution, and Stop food choices are explained. Plant-based and lean animal proteins are emphasized. Rationale provided for low sodium intake for blood pressure control, low added sugars for blood sugar stabilization, and low added fats and oils for coronary artery disease risk reduction and weight management.  Calorie Density  Clinical staff conducted group or individual video education with verbal and written material and guidebook.  Patient learns about calorie density and how it impacts the Pritikin Eating Plan. Knowing the characteristics of the food you choose will help you decide whether those foods will lead to weight gain or weight loss, and whether you want to consume more or less of them. Weight loss is usually a side effect of the Pritikin Eating Plan because of its focus on low calorie-dense foods.  Label Reading  Clinical staff conducted group or individual video  education with verbal and written material and guidebook.  Patient learns about the Pritikin recommended label reading guidelines and corresponding recommendations regarding calorie density, added sugars, sodium content, and whole grains.  Dining Out - Part 1  Clinical staff conducted group  or individual video education with verbal and written material and guidebook.  Patient learns that restaurant meals can be sabotaging because they can be so high in calories, fat, sodium, and/or sugar. Patient learns recommended strategies on how to positively address this and avoid unhealthy pitfalls.  Facts on Fats  Clinical staff conducted group or individual video education with verbal and written material and guidebook.  Patient learns that lifestyle modifications can be just as effective, if not more so, as many medications for lowering your risk of heart disease. A Pritikin lifestyle can help to reduce your risk of inflammation and atherosclerosis (cholesterol build-up, or plaque, in the artery walls). Lifestyle interventions such as dietary choices and physical activity address the cause of atherosclerosis. A review of the types of fats and their impact on blood cholesterol levels, along with dietary recommendations to reduce fat intake is also included.  Nutrition Action Plan  Clinical staff conducted group or individual video education with verbal and written material and guidebook.  Patient learns how to incorporate Pritikin recommendations into their lifestyle. Recommendations include planning and keeping personal health goals in mind as an important part of their success.  Healthy Mind-Set    Healthy Minds, Bodies, Hearts  Clinical staff conducted group or individual video education with verbal and written material and guidebook.  Patient learns how to identify when they are stressed. Video will discuss the impact of that stress, as well as the many benefits of stress management. Patient will also  be introduced to stress management techniques. The way we think, act, and feel has an impact on our hearts.  How Our Thoughts Can Heal Our Hearts  Clinical staff conducted group or individual video education with verbal and written material and guidebook.  Patient learns that negative thoughts can cause depression and anxiety. This can result in negative lifestyle behavior and serious health problems. Cognitive behavioral therapy is an effective method to help control our thoughts in order to change and improve our emotional outlook.  Additional Videos:  Exercise    Improving Performance  Clinical staff conducted group or individual video education with verbal and written material and guidebook.  Patient learns to use a non-linear approach by alternating intensity levels and lengths of time spent exercising to help burn more calories and lose more body fat. Cardiovascular exercise helps improve heart health, metabolism, hormonal balance, blood sugar control, and recovery from fatigue. Resistance training improves strength, endurance, balance, coordination, reaction time, metabolism, and muscle mass. Flexibility exercise improves circulation, posture, and balance. Seek guidance from your physician and exercise physiologist before implementing an exercise routine and learn your capabilities and proper form for all exercise.  Introduction to Yoga  Clinical staff conducted group or individual video education with verbal and written material and guidebook.  Patient learns about yoga, a discipline of the coming together of mind, breath, and body. The benefits of yoga include improved flexibility, improved range of motion, better posture and core strength, increased lung function, weight loss, and positive self-image. Yoga's heart health benefits include lowered blood pressure, healthier heart rate, decreased cholesterol and triglyceride levels, improved immune function, and reduced stress. Seek guidance  from your physician and exercise physiologist before implementing an exercise routine and learn your capabilities and proper form for all exercise.  Medical   Aging: Enhancing Your Quality of Life  Clinical staff conducted group or individual video education with verbal and written material and guidebook.  Patient learns key strategies and recommendations to stay in good physical health  and enhance quality of life, such as prevention strategies, having an advocate, securing a Health Care Proxy and Power of Attorney, and keeping a list of medications and system for tracking them. It also discusses how to avoid risk for bone loss.  Biology of Weight Control  Clinical staff conducted group or individual video education with verbal and written material and guidebook.  Patient learns that weight gain occurs because we consume more calories than we burn (eating more, moving less). Even if your body weight is normal, you may have higher ratios of fat compared to muscle mass. Too much body fat puts you at increased risk for cardiovascular disease, heart attack, stroke, type 2 diabetes, and obesity-related cancers. In addition to exercise, following the [place] can help reduce your risk.  Decoding Lab Results  Clinical staff conducted group or individual video education with verbal and written material and guidebook.  Patient learns that lab test reflects one measurement whose values change over time and are influenced by many factors, including medication, stress, sleep, exercise, food, hydration, pre-existing medical conditions, and more. It is recommended to use the knowledge from this video to become more involved with your lab results and evaluate your numbers to speak with your doctor.   Diseases of Our Time - Overview  Clinical staff conducted group or individual video education with verbal and written material and guidebook.  Patient learns that according to the CDC, 50% to 70% of  chronic diseases (such as obesity, type 2 diabetes, elevated lipids, hypertension, and heart disease) are avoidable through lifestyle improvements including healthier food choices, listening to satiety cues, and increased physical activity.  Sleep Disorders Clinical staff conducted group or individual video education with verbal and written material and guidebook.  Patient learns how good quality and duration of sleep are important to overall health and well-being. Patient also learns about sleep disorders and how they impact health along with recommendations to address them, including discussing with a physician.  Nutrition  Dining Out - Part 2 Clinical staff conducted group or individual video education with verbal and written material and guidebook.  Patient learns how to plan ahead and communicate in order to maximize their dining experience in a healthy and nutritious manner. Included are recommended food choices based on the type of restaurant the patient is visiting.   Fueling a Best boy conducted group or individual video education with verbal and written material and guidebook.  There is a strong connection between our food choices and our health. Diseases like obesity and type 2 diabetes are very prevalent and are in large-part due to lifestyle choices. The Pritikin Eating Plan provides plenty of food and hunger-curbing satisfaction. It is easy to follow, affordable, and helps reduce health risks.  Menu Workshop  Clinical staff conducted group or individual video education with verbal and written material and guidebook.  Patient learns that restaurant meals can sabotage health goals because they are often packed with calories, fat, sodium, and sugar. Recommendations include strategies to plan ahead and to communicate with the manager, chef, or server to help order a healthier meal.  Planning Your Eating Strategy  Clinical staff conducted group or individual video  education with verbal and written material and guidebook.  Patient learns about the [place] and its benefit of reducing the risk of disease. The [place] does not focus on calories. Instead, it emphasizes high-quality, nutrient-rich foods. By knowing the characteristics of the foods, we choose, we can  determine their calorie density and make informed decisions.  Targeting Your Nutrition Priorities  Clinical staff conducted group or individual video education with verbal and written material and guidebook.  Patient learns that lifestyle habits have a tremendous impact on disease risk and progression. This video provides eating and physical activity recommendations based on your personal health goals, such as reducing LDL cholesterol, losing weight, preventing or controlling type 2 diabetes, and reducing high blood pressure.  Vitamins and Minerals  Clinical staff conducted group or individual video education with verbal and written material and guidebook.  Patient learns different ways to obtain key vitamins and minerals, including through a recommended healthy diet. It is important to discuss all supplements you take with your doctor.   Healthy Mind-Set    Smoking Cessation  Clinical staff conducted group or individual video education with verbal and written material and guidebook.  Patient learns that cigarette smoking and tobacco addiction pose a serious health risk which affects millions of people. Stopping smoking will significantly reduce the risk of heart disease, lung disease, and many forms of cancer. Recommended strategies for quitting are covered, including working with your doctor to develop a successful plan.  Culinary   Becoming a Financial trader conducted group or individual video education with verbal and written material and guidebook.  Patient learns that cooking at home can be healthy, cost-effective, quick, and puts them in control. Keys to  cooking healthy recipes will include looking at your recipe, assessing your equipment needs, planning ahead, making it simple, choosing cost-effective seasonal ingredients, and limiting the use of added fats, salts, and sugars.  Cooking - Breakfast and Snacks  Clinical staff conducted group or individual video education with verbal and written material and guidebook.  Patient learns how important breakfast is to satiety and nutrition through the entire day. Recommendations include key foods to eat during breakfast to help stabilize blood sugar levels and to prevent overeating at meals later in the day. Planning ahead is also a key component.  Cooking - Human resources officer conducted group or individual video education with verbal and written material and guidebook.  Patient learns eating strategies to improve overall health, including an approach to cook more at home. Recommendations include thinking of animal protein as a side on your plate rather than center stage and focusing instead on lower calorie dense options like vegetables, fruits, whole grains, and plant-based proteins, such as beans. Making sauces in large quantities to freeze for later and leaving the skin on your vegetables are also recommended to maximize your experience.  Cooking - Healthy Salads and Dressing Clinical staff conducted group or individual video education with verbal and written material and guidebook.  Patient learns that vegetables, fruits, whole grains, and legumes are the foundations of the [place]. Recommendations include how to incorporate each of these in flavorful and healthy salads, and how to create homemade salad dressings. Proper handling of ingredients is also covered. Cooking - Soups and [employer] - Soups and Desserts Clinical staff conducted group or individual video education with verbal and written material and guidebook.  Patient learns that Pritikin soups and desserts  make for easy, nutritious, and delicious snacks and meal components that are low in sodium, fat, sugar, and calorie density, while high in vitamins, minerals, and filling fiber. Recommendations include simple and healthy ideas for soups and desserts.   Overview     The Pritikin Solution Program Overview Clinical staff conducted group or  individual video education with verbal and written material and guidebook.  Patient learns that the results of the [place] Program have been documented in more than 100 articles published in peer-reviewed journals, and the benefits include reducing risk factors for (and, in some cases, even reversing) high cholesterol, high blood pressure, type 2 diabetes, obesity, and more! An overview of the three key pillars of the Pritikin Program will be covered: eating well, doing regular exercise, and having a healthy mind-set.  WORKSHOPS  Exercise: Exercise Basics: Building Your Action Plan Clinical staff led group instruction and group discussion with PowerPoint presentation and patient guidebook. To enhance the learning environment the use of posters, models and videos may be added. At the conclusion of this workshop, patients will comprehend the difference between physical activity and exercise, as well as the benefits of incorporating both, into their routine. Patients will understand the FITT (Frequency, Intensity, Time, and Type) principle and how to use it to build an exercise action plan. In addition, safety concerns and other considerations for exercise and cardiac rehab will be addressed by the presenter. The purpose of this lesson is to promote a comprehensive and effective weekly exercise routine in order to improve patients' overall level of fitness.   Managing Heart Disease: Your Path to a Healthier Heart Clinical staff led group instruction and group discussion with PowerPoint presentation and patient guidebook. To enhance the learning environment the use  of posters, models and videos may be added.At the conclusion of this workshop, patients will understand the anatomy and physiology of the heart. Additionally, they will understand how Pritikin's three pillars impact the risk factors, the progression, and the management of heart disease.  The purpose of this lesson is to provide a high-level overview of the heart, heart disease, and how the Pritikin lifestyle positively impacts risk factors.  Exercise Biomechanics Clinical staff led group instruction and group discussion with PowerPoint presentation and patient guidebook. To enhance the learning environment the use of posters, models and videos may be added. Patients will learn how the structural parts of their bodies function and how these functions impact their daily activities, movement, and exercise. Patients will learn how to promote a neutral spine, learn how to manage pain, and identify ways to improve their physical movement in order to promote healthy living. The purpose of this lesson is to expose patients to common physical limitations that impact physical activity. Participants will learn practical ways to adapt and manage aches and pains, and to minimize their effect on regular exercise. Patients will learn how to maintain good posture while sitting, walking, and lifting.  Balance Training and Fall Prevention  Clinical staff led group instruction and group discussion with PowerPoint presentation and patient guidebook. To enhance the learning environment the use of posters, models and videos may be added. At the conclusion of this workshop, patients will understand the importance of their sensorimotor skills (vision, proprioception, and the vestibular system) in maintaining their ability to balance as they age. Patients will apply a variety of balancing exercises that are appropriate for their current level of function. Patients will understand the common causes for poor balance,  possible solutions to these problems, and ways to modify their physical environment in order to minimize their fall risk. The purpose of this lesson is to teach patients about the importance of maintaining balance as they age and ways to minimize their risk of falling.  WORKSHOPS   Nutrition:  Fueling a Scientist, research (physical sciences) led group instruction and group  discussion with PowerPoint presentation and patient guidebook. To enhance the learning environment the use of posters, models and videos may be added. Patients will review the foundational principles of the [place] and understand what constitutes a serving size in each of the food groups. Patients will also learn Pritikin-friendly foods that are better choices when away from home and review make-ahead meal and snack options. Calorie density will be reviewed and applied to three nutrition priorities: weight maintenance, weight loss, and weight gain. The purpose of this lesson is to reinforce (in a group setting) the key concepts around what patients are recommended to eat and how to apply these guidelines when away from home by planning and selecting Pritikin-friendly options. Patients will understand how calorie density may be adjusted for different weight management goals.  Mindful Eating  Clinical staff led group instruction and group discussion with PowerPoint presentation and patient guidebook. To enhance the learning environment the use of posters, models and videos may be added. Patients will briefly review the concepts of the [place] and the importance of low-calorie dense foods. The concept of mindful eating will be introduced as well as the importance of paying attention to internal hunger signals. Triggers for non-hunger eating and techniques for dealing with triggers will be explored. The purpose of this lesson is to provide patients with the opportunity to review the basic principles of the [place], discuss the value of eating mindfully and how to measure internal cues of hunger and fullness using the Hunger Scale. Patients will also discuss reasons for non-hunger eating and learn strategies to use for controlling emotional eating.  Targeting Your Nutrition Priorities Clinical staff led group instruction and group discussion with PowerPoint presentation and patient guidebook. To enhance the learning environment the use of posters, models and videos may be added. Patients will learn how to determine their genetic susceptibility to disease by reviewing their family history. Patients will gain insight into the importance of diet as part of an overall healthy lifestyle in mitigating the impact of genetics and other environmental insults. The purpose of this lesson is to provide patients with the opportunity to assess their personal nutrition priorities by looking at their family history, their own health history and current risk factors. Patients will also be able to discuss ways of prioritizing and modifying the [place] for their highest risk areas  Menu  Clinical staff led group instruction and group discussion with PowerPoint presentation and patient guidebook. To enhance the learning environment the use of posters, models and videos may be added. Using menus brought in from [employer], or printed from [employer], patients will apply the [place] dining out guidelines that were presented in the [employer] video. Patients will also be able to practice these guidelines in a variety of provided scenarios. The purpose of this lesson is to provide patients with the opportunity to practice hands-on learning of the [place] with actual menus and practice scenarios.  Label Reading Clinical staff led group instruction and group discussion with PowerPoint presentation and patient guidebook. To enhance the learning environment the use of  posters, models and videos may be added. Patients will review and discuss the Pritikin label reading guidelines presented in Pritikin's Label Reading Educational series video. Using fool labels brought in from local grocery stores and markets, patients will apply the label reading guidelines and determine if the packaged food meet the Pritikin guidelines. The purpose of this lesson is  to provide patients with the opportunity to review, discuss, and practice hands-on learning of the Pritikin Label Reading guidelines with actual packaged food labels. [place] Workshops are designed to teach patients ways to prepare quick, simple, and affordable recipes at home. The importance of nutrition's role in chronic disease risk reduction is reflected in its emphasis in the overall Pritikin program. By learning how to prepare essential core Pritikin Eating Plan recipes, patients will increase control over what they eat; be able to customize the flavor of foods without the use of added salt, sugar, or fat; and improve the quality of the food they consume. By learning a set of core recipes which are easily assembled, quickly prepared, and affordable, patients are more likely to prepare more healthy foods at home. These workshops focus on convenient breakfasts, simple entres, side dishes, and desserts which can be prepared with minimal effort and are consistent with nutrition recommendations for cardiovascular risk reduction. Cooking [employer] are taught by a Engineer, materials (RD) who has been trained by the [employer]. The chef or RD has a clear understanding of the importance of minimizing - if not completely eliminating - added fat, sugar, and sodium in recipes. Throughout the series of [place] Workshop sessions, patients will learn about healthy ingredients and efficient methods of cooking to build confidence in their capability to  prepare    Cooking School weekly topics:  Adding Flavor- Sodium-Free  Fast and Healthy Breakfasts  Powerhouse Plant-Based Proteins  Satisfying Salads and Dressings  Simple Sides and Sauces  International Cuisine-Spotlight on the [place] Zones  Delicious Desserts  Savory Soups  Efficiency Cooking - Meals in a Snap  Tasty Appetizers and Snacks  Comforting Weekend Breakfasts  One-Pot Wonders   Fast Evening Meals  Easy [place] (Psychosocial): New Thoughts, New Behaviors Clinical staff led group instruction and group discussion with PowerPoint presentation and patient guidebook. To enhance the learning environment the use of posters, models and videos may be added. Patients will learn and practice techniques for developing effective health and lifestyle goals. Patients will be able to effectively apply the goal setting process learned to develop at least one new personal goal.  The purpose of this lesson is to expose patients to a new skill set of behavior modification techniques such as techniques setting SMART goals, overcoming barriers, and achieving new thoughts and new behaviors.  Managing Moods and Relationships Clinical staff led group instruction and group discussion with PowerPoint presentation and patient guidebook. To enhance the learning environment the use of posters, models and videos may be added. Patients will learn how emotional and chronic stress factors can impact their health and relationships. They will learn healthy ways to manage their moods and utilize positive coping mechanisms. In addition, ICR patients will learn ways to improve communication skills. The purpose of this lesson is to expose patients to ways of understanding how one's mood and health are intimately connected. Developing a healthy outlook can help build positive relationships and connections with others. Patients will understand the  importance of utilizing effective communication skills that include actively listening and being heard. They will learn and understand the importance of the "4 Cs" and especially Connections in fostering of a Healthy Mind-Set.  Healthy Sleep for a Healthy Heart Clinical staff led group instruction and group discussion with PowerPoint presentation and patient guidebook. To enhance the learning environment the use of  posters, models and videos may be added. At the conclusion of this workshop, patients will be able to demonstrate knowledge of the importance of sleep to overall health, well-being, and quality of life. They will understand the symptoms of, and treatments for, common sleep disorders. Patients will also be able to identify daytime and nighttime behaviors which impact sleep, and they will be able to apply these tools to help manage sleep-related challenges. The purpose of this lesson is to provide patients with a general overview of sleep and outline the importance of quality sleep. Patients will learn about a few of the most common sleep disorders. Patients will also be introduced to the concept of "sleep hygiene," and discover ways to self-manage certain sleeping problems through simple daily behavior changes. Finally, the workshop will motivate patients by clarifying the links between quality sleep and their goals of heart-healthy living.   Recognizing and Reducing Stress Clinical staff led group instruction and group discussion with PowerPoint presentation and patient guidebook. To enhance the learning environment the use of posters, models and videos may be added. At the conclusion of this workshop, patients will be able to understand the types of stress reactions, differentiate between acute and chronic stress, and recognize the impact that chronic stress has on their health. They will also be able to apply different coping mechanisms, such as reframing negative self-talk. Patients will have the  opportunity to practice a variety of stress management techniques, such as deep abdominal breathing, progressive muscle relaxation, and/or guided imagery.  The purpose of this lesson is to educate patients on the role of stress in their lives and to provide healthy techniques for coping with it.  Learning Barriers/Preferences:  Learning Barriers/Preferences - 07/18/22 1503       Learning Barriers/Preferences   Learning Barriers Sight;Exercise Concerns    Learning Preferences Audio;Computer/Internet;Group Instruction;Individual Instruction;Pictoral;Skilled Demonstration;Verbal Instruction;Video;Written Material             Education Topics:  Knowledge Questionnaire Score:  Knowledge Questionnaire Score - 07/18/22 1503       Knowledge Questionnaire Score   Pre Score 20/24             Core Components/Risk Factors/Patient Goals at Admission:  Personal Goals and Risk Factors at Admission - 07/18/22 1506       Core Components/Risk Factors/Patient Goals on Admission    Weight Management Yes;Weight Maintenance    Intervention Weight Management: Develop a combined nutrition and exercise program designed to reach desired caloric intake, while maintaining appropriate intake of nutrient and fiber, sodium and fats, and appropriate energy expenditure required for the weight goal.;Weight Management: Provide education and appropriate resources to help participant work on and attain dietary goals.    Expected Outcomes Short Term: Continue to assess and modify interventions until short term weight is achieved;Long Term: Adherence to nutrition and physical activity/exercise program aimed toward attainment of established weight goal;Weight Loss: Understanding of general recommendations for a balanced deficit meal plan, which promotes 1-2 lb weight loss per week and includes a negative energy balance of [phone] kcal/d;Weight Maintenance: Understanding of the daily nutrition guidelines, which  includes 25-35% calories from fat, 7% or less cal from saturated fats, less than '200mg'$  cholesterol, less than 1.5gm of sodium, & 5 or more servings of fruits and vegetables daily;Understanding recommendations for meals to include 15-35% energy as protein, 25-35% energy from fat, 35-60% energy from carbohydrates, less than '200mg'$  of dietary cholesterol, 20-35 gm of total fiber daily;Understanding of distribution of calorie intake throughout the day  with the consumption of 4-5 meals/snacks    Tobacco Cessation Yes    Number of packs per day 1/2 PPD    Intervention Assist the participant in steps to quit. Provide individualized education and counseling about committing to Tobacco Cessation, relapse prevention, and pharmacological support that can be provided by physician.;Advice worker, assist with locating and accessing local/national Quit Smoking programs, and support quit date choice.    Expected Outcomes Short Term: Will demonstrate readiness to quit, by selecting a quit date.;Short Term: Will quit all tobacco product use, adhering to prevention of relapse plan.;Long Term: Complete abstinence from all tobacco products for at least 12 months from quit date.    Heart Failure Yes    Intervention Provide a combined exercise and nutrition program that is supplemented with education, support and counseling about heart failure. Directed toward relieving symptoms such as shortness of breath, decreased exercise tolerance, and extremity edema.    Expected Outcomes Improve functional capacity of life;Short term: Attendance in program 2-3 days a week with increased exercise capacity. Reported lower sodium intake. Reported increased fruit and vegetable intake. Reports medication compliance.;Short term: Daily weights obtained and reported for increase. Utilizing diuretic protocols set by physician.;Long term: Adoption of self-care skills and reduction of barriers for early signs and symptoms recognition and  intervention leading to self-care maintenance.    Hypertension Yes    Intervention Provide education on lifestyle modifcations including regular physical activity/exercise, weight management, moderate sodium restriction and increased consumption of fresh fruit, vegetables, and low fat dairy, alcohol moderation, and smoking cessation.;Monitor prescription use compliance.    Expected Outcomes Short Term: Continued assessment and intervention until BP is < 140/16m HG in hypertensive participants. < 130/861mHG in hypertensive participants with diabetes, heart failure or chronic kidney disease.;Long Term: Maintenance of blood pressure at goal levels.    Lipids Yes    Intervention Provide education and support for participant on nutrition & aerobic/resistive exercise along with prescribed medications to achieve LDL '70mg'$ , HDL >'40mg'$ .    Expected Outcomes Short Term: Participant states understanding of desired cholesterol values and is compliant with medications prescribed. Participant is following exercise prescription and nutrition guidelines.;Long Term: Cholesterol controlled with medications as prescribed, with individualized exercise RX and with personalized nutrition plan. Value goals: LDL < '70mg'$ , HDL > 40 mg.    Stress Yes    Intervention Offer individual and/or small group education and counseling on adjustment to heart disease, stress management and health-related lifestyle change. Teach and support self-help strategies.;Refer participants experiencing significant psychosocial distress to appropriate mental health specialists for further evaluation and treatment. When possible, include family members and significant others in education/counseling sessions.    Expected Outcomes Short Term: Participant demonstrates changes in health-related behavior, relaxation and other stress management skills, ability to obtain effective social support, and compliance with psychotropic medications if prescribed.;Long  Term: Emotional wellbeing is indicated by absence of clinically significant psychosocial distress or social isolation.    Personal Goal Other Yes    Personal Goal Short: ADL's easier Long: stamina, get back to normal life    Intervention Will continue to monitor pt and progress workloads as tolerated without sign or symptom    Expected Outcomes Pt will achieve his goals and gain strength             Core Components/Risk Factors/Patient Goals Review:   Goals and Risk Factor Review     Row Name 07/22/22 1600 08/06/22 1543 09/04/22 1648 10/01/22 1141  Core Components/Risk Factors/Patient Goals Review   Personal Goals Review Weight Management/Obesity;Heart Failure;Stress;Lipids;Hypertension;Tobacco Cessation Weight Management/Obesity;Heart Failure;Stress;Lipids;Hypertension;Tobacco Cessation Weight Management/Obesity;Heart Failure;Stress;Lipids;Hypertension;Tobacco Cessation Weight Management/Obesity;Heart Failure;Stress;Lipids;Hypertension;Tobacco Cessation    Review Christopher Gutierrez started intensive cardiac rehab on 07/22/22 and did well with exercise. Vital signs were stable Christopher Gutierrez is off to a good start to exercise at intensive cardiac rehab . Vital signs were stable. Continues to smoke has no intention of quitting currently. Christopher Gutierrez is doing well with exercise at intensive cardiac rehab . Vital signs have been  stable. Continues to smoke has no intention of quitting currently. Christopher Gutierrez will complete cardiac rehab on 09/13/22 Christopher Gutierrez has been  doing well with exercise at intensive cardiac rehab . Vital signs have been stable. Continues to smoke has no intention of quitting currently. Christopher Gutierrez wants to complete intensive cardiac rehab after dental issues are improved    Expected Outcomes Christopher Gutierrez will continue to participate in intensive cardiac rehab foe exercise, nutrition and lifestyle modifications. Will continue to encourage smoking cessation Christopher Gutierrez will continue to participate in intensive cardiac rehab foe exercise,  nutrition and lifestyle modifications. Christopher Gutierrez will continue to participate in intensive cardiac rehab foe exercise, nutrition and lifestyle modifications. Christopher Gutierrez will continue to participate in intensive cardiac rehab foe exercise, nutrition and lifestyle modifications.             Core Components/Risk Factors/Patient Goals at Discharge (Final Review):   Goals and Risk Factor Review - 10/01/22 1141       Core Components/Risk Factors/Patient Goals Review   Personal Goals Review Weight Management/Obesity;Heart Failure;Stress;Lipids;Hypertension;Tobacco Cessation    Review Christopher Gutierrez has been  doing well with exercise at intensive cardiac rehab . Vital signs have been stable. Continues to smoke has no intention of quitting currently. Christopher Gutierrez wants to complete intensive cardiac rehab after dental issues are improved    Expected Outcomes Christopher Gutierrez will continue to participate in intensive cardiac rehab foe exercise, nutrition and lifestyle modifications.             ITP Comments:  ITP Comments     Row Name 07/18/22 1548 07/22/22 1539 08/06/22 1541 09/04/22 1644 10/01/22 1139   ITP Comments Dr Fransico Him MD, Medical Direcotr, Introduction to [facility] Education Program/ Intensive Cardiac rehab. Initial Orientation packet Reviewed with the patient. 30 Day ITP Review. Christopher Gutierrez started intensive cardiac rehab on 07/22/22 and did well with exercise 30 Day ITP Review. Christopher Gutierrez has good attendance and participation in intensive cardiac rehab on 07/22/22 and is off to a good start to  exercise 30 Day ITP Review. Christopher Gutierrez has good attendance and participation in intensive cardiac rehab. Christopher Gutierrez will complete cardiac rehab on 09/13/22 30 Day ITP Review. Christopher Gutierrez has been absent from cardiac rehab due to dental abcess/ extraction            Comments: See ITP comments.Harrell Gave RN BSN

## 2022-10-08 ENCOUNTER — Ambulatory Visit (INDEPENDENT_AMBULATORY_CARE_PROVIDER_SITE_OTHER): Payer: Commercial Managed Care - PPO | Admitting: Psychologist

## 2022-10-08 DIAGNOSIS — F411 Generalized anxiety disorder: Secondary | ICD-10-CM | POA: Diagnosis not present

## 2022-10-08 NOTE — Progress Notes (Signed)
[facility] Counselor/Therapist Progress Note  Patient ID: Christopher Gutierrez, MRN: [id number],    Date: 10/08/2022  Time Spent: 02:06 pm to 02:36 pm; total time: 30 minutes   This session was held via in person. The patient consented to in-person therapy and was in the clinician's office. Limits of confidentiality were discussed with the patient.   Treatment Type: Individual Therapy  Reported Symptoms: Less depressed. Difficulty with some motivation  Mental Status Exam: Appearance:  Well Groomed     Behavior: Appropriate  Motor: Normal  Speech/Language:  Slow  Affect: Flat  Mood: constricted  Thought process: normal  Thought content:   WNL  Sensory/Perceptual disturbances:   WNL  Orientation: oriented to person, place, and time/date  Attention: Good  Concentration: Good  Memory: WNL  Fund of knowledge:  Good  Insight:   Poor  Judgment:  Fair  Impulse Control: Good   Risk Assessment: Danger to Self:  No Self-injurious Behavior: No Danger to Others: No Duty to Warn:no Physical Aggression / Violence:No  Access to Firearms a concern: No  Gang Involvement:No   Subjective: Beginning the session, patient stated that he fees like he is okay and that he was awaiting approval for dental surgery. He also voiced being unsure regarding whether or not he can participate in cardiac rehabilitation. He voiced that he was beginning to clean his room. From there, he disclosed that he doe snot have an accountability partner and that asking for help is challenging for him. He processed challenges with asking for help. He voiced understanding regarding the transition. He was agreeable to the homework and following up. He denied suicidal and homicidal ideation.    Interventions:  Worked on developing a therapeutic relationship with the patient using active listening and reflective statements. Provided emotional support using empathy and validation. Reviewed the treatment plan  with the patient. Reviewed events since the last session. Explored what barriers were preventing patient from getting surgery completed. Encouraged patient to contact dentist office and cardiac rehabilitation to get clarification. Praised patient for completing assignment of cleaning the room. Used socratic questions to assist the patient. Identified the theme of asking for help. Explored barriers to asking for help. Normalized and validated thoughts. Challenged some of the thoughts. Discussed next steps moving forward. Disclosed to patient about transition. Clarified the transition. Processed thoughts and emotions. Assigned homework. Provided empathic statements.Assessed for suicidal and homicidal ideation.   Homework: Contact dentist and cardiac rehabilitation  Next Session: Review homework and emotional support.   Diagnosis: F41.1 generalized anxiety disorder  Plan:  Goals Work through the grieving process and face reality of own death Accept emotional support from others around them Live life to the fullest, event though time may be limited Become as knowledgeable about the medical condition  Reduce fear, anxiety about the health condition  Accept the illness Accept the role of psychological and behavioral factors  Stabilize anxiety level wile increasing ability to function Learn and implement coping skills that result in a reduction of anxiety  Alleviate depressive symptoms Recognize, accept, and cope with depressive feelings Develop healthy thinking patterns Develop healthy interpersonal relationships  Objectives target date for all objectives is 03/20/2023 Identify feelings associated with the illness Family members share with each other feelings Identify the losses or limitations that have been experienced Verbalize acceptance of the reality of the medical condition Commit to learning and implement a proactive approach to managing personal stresses Verbalize an understanding of  the medical condition Work with therapist to  develop a plan for coping with stress Learn and implement skills for managing stress Engage in social, productive activities that are possible Engage in faith based activities implement positive imagery Identify coping skills and sources of emotional support Patient's partner and family members verbalize their fears regarding severity of health condition Identify sources of emotional distress  Learning and implement calming skills to reduce overall anxiety Learn and implement problem solving strategies Identify and engage in pleasant activities Learning and implement personal and interpersonal skills to reduce anxiety and improve interpersonal relationships Learn to accept limitations in life and commit to tolerating, rather than avoiding, unpleasant emotions while accomplishing meaningful goals Identify major life conflicts from the past and present that form the basis for present anxiety Learn and implement behavioral strategies Verbalize an understanding and resolution of current interpersonal problems Learn and implement problem solving and decision making skills Learn and implement conflict resolution skills to resolve interpersonal problems Verbalize an understanding of healthy and unhealthy emotions verbalize insight into how past relationships may be influence current experiences with depression Use mindfulness and acceptance strategies and increase value based behavior  Increase hopeful statements about the future.   Interventions Teach about stress and ways to handle stress Assist the patient in developing a coping action plan for stressors Conduct skills based training for coping strategies Train problem focused skills Sort out what activities the individual can do Encourage patient to rely upon his/her spiritual faith Teach the patient to use guided imagery Probe and evaluate family's ability to provide emotional support Allow  family to share their fears Assist the patient in identifying, sorting through, and verbalizing the various feelings generated by his/her medical condition Meet with family members  Ask patient list out limitations  Use stress inoculation training  Use Acceptance and Commitment Therapy to help client accept uncomfortable realities in order to accomplish value-consistent goals Reinforce the client's insight into the role of his/her past emotional pain and present anxiety  Discuss examples demonstrating that unrealistic worry overestimates the probability of threats and underestimate patient's ability  Assist the patient in analyzing his or her worries Help patient understand that avoidance is reinforcing  Behavioral activation help the client explore the relationship, nature of the dispute,  Help the client develop new interpersonal skills and relationships Conduct Problem so living therapy Teach conflict resolution skills Use a process-experiential approach Conduct TLDP Conduct ACT  The patient and clinician reviewed the treatment plan on 04/24/2022. The patient approved of the treatment plan.   Conception Chancy, PsyD

## 2022-10-10 ENCOUNTER — Telehealth: Payer: Self-pay | Admitting: *Deleted

## 2022-10-10 NOTE — Telephone Encounter (Signed)
Secure chat message sent to Debria Garret to contact the patient bring back or complete the itamar device given to him in September.

## 2022-10-11 ENCOUNTER — Telehealth (HOSPITAL_COMMUNITY): Payer: Self-pay | Admitting: *Deleted

## 2022-10-11 ENCOUNTER — Encounter (HOSPITAL_COMMUNITY): Payer: Self-pay | Admitting: *Deleted

## 2022-10-11 DIAGNOSIS — Z955 Presence of coronary angioplasty implant and graft: Secondary | ICD-10-CM

## 2022-10-11 NOTE — Progress Notes (Signed)
Discharge Progress Report  Patient Details  Name: Christopher Gutierrez MRN: [id number] Date of Birth: 10/15/1960 Referring Provider:   Flowsheet Row INTENSIVE CARDIAC REHAB ORIENT from 07/18/2022 in [facility]  Referring Provider Dr Oswaldo Milian, MD        Number of Visits: 36  Reason for Discharge:  Patient independent in their exercise.  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  ADL UCSD:   Initial Exercise Prescription:   Discharge Exercise Prescription (Final Exercise Prescription Changes):  Exercise Prescription Changes - 09/06/22 1642       Response to Exercise   Blood Pressure (Admit) 124/84    Blood Pressure (Exercise) 120/72    Blood Pressure (Exit) 106/64    Heart Rate (Admit) 58 bpm    Heart Rate (Exercise) 80 bpm    Heart Rate (Exit) 61 bpm    Rating of Perceived Exertion (Exercise) 11.5    Perceived Dyspnea (Exercise) 0    Symptoms 0    Comments Reviewed MET's    Duration Progress to 30 minutes of  aerobic without signs/symptoms of physical distress    Intensity THRR unchanged      Progression   Progression Continue to progress workloads to maintain intensity without signs/symptoms of physical distress.    Average METs 1.8      Resistance Training   Training Prescription Yes    Weight 3 lbs wts    Reps 10-15    Time 10 Minutes      Arm Ergometer   Level 2    Minutes 15    METs 1.9      T5 Nustep   Level 2    SPM 73    Minutes 15    METs 1.9      Home Exercise Plan   Plans to continue exercise at Home (comment)    Frequency Add 2 additional  days to program exercise sessions.    Initial Home Exercises Provided 08/21/21             Functional Capacity:   Psychological, QOL, Others - Outcomes: PHQ 2/9:    07/18/2022    4:00 PM 07/13/2021    2:13 PM 05/17/2021   11:46 AM 10/12/2020    3:15 PM 09/29/2017    9:15 AM  Depression screen PHQ 2/9  Decreased Interest '1 1 1 1 '$ 0  Down, Depressed, Hopeless 0 0 2 1 0  PHQ - 2 Score '1 1 3 2 '$ 0  Altered sleeping   2 0   Tired, decreased energy   1 2   Change in appetite   0 0   Feeling bad or failure about yourself    1 2   Trouble concentrating   1 2   Moving slowly or fidgety/restless   1 2   Suicidal thoughts   0 0   PHQ-9 Score   9 10   Difficult doing work/chores   Somewhat difficult Somewhat difficult     Quality of Life:   Personal Goals: Goals established at orientation with interventions provided to work  toward goal.    Personal Goals Discharge:  Goals and Risk Factor Review     Row Name 09/04/22 1648 10/01/22 1141 10/17/22 1538         Core Components/Risk Factors/Patient Goals Review   Personal Goals Review Weight Management/Obesity;Heart Failure;Stress;Lipids;Hypertension;Tobacco Cessation Weight Management/Obesity;Heart Failure;Stress;Lipids;Hypertension;Tobacco Cessation Weight Management/Obesity;Heart Failure;Stress;Lipids;Hypertension;Tobacco Cessation     Review Christopher Gutierrez is doing well with exercise at intensive cardiac rehab . Vital signs have been  stable. Continues to smoke has no intention of quitting currently. Christopher Gutierrez will complete cardiac rehab on 09/13/22 Christopher Gutierrez has been  doing well with exercise at intensive cardiac rehab . Vital signs have been stable. Continues to smoke has no intention of quitting currently. Christopher Gutierrez wants to complete intensive cardiac rehab after dental issues are improved Christopher Gutierrez has been  doing well with exercise at intensive cardiac rehab . Vital signs have been stable. Continues to smoke has no intention of quitting currently. Christopher Gutierrez did  not return to copmplete cardiac rehab after having a tooth pulled. Christopher Gutierrez last day of exercise at cardiac rehab was on 09/13/22     Expected Outcomes Christopher Gutierrez will continue to participate in intensive cardiac rehab foe exercise, nutrition and lifestyle modifications. Christopher Gutierrez will continue to participate in intensive cardiac rehab foe exercise, nutrition and lifestyle modifications. Christopher Gutierrez will continue to exercise, follow nutrition and lifestyle modifications after participating in cardiac rehab.              Exercise Goals and Review:   Exercise Goals Re-Evaluation:  Exercise Goals Re-Evaluation     Row Name 08/21/22 1624             Exercise Goal Re-Evaluation   Exercise Goals Review Increase Physical Activity;Understanding of Exercise Prescription;Increase Strength and Stamina;Knowledge and understanding of Target Heart Rate Range (THRR);Able to understand and use rate of perceived exertion (RPE) scale       Comments Reviewed MET's, goals and home ExRx. Pt tolerated exercise well with an average MET level of 1.8. Pt feels better about his goals, but felt he had a little set back with his eye surgery. He is ready to get back started now that the restrictions are lifted. Pt will add in exercise by doing [employer] chair fitness and Tai chi classes 1-2 days a week or 30 mins as tolerated by fatigue       Expected Outcomes Will continue to progress pt as tolerated without sign or symptom. Pt will add in exercise and increase strength and stamina                Nutrition & Weight - Outcomes:    Nutrition:  Nutrition Therapy & Goals - 09/20/22 1552       Nutrition Therapy   Diet Heart Healthy Diet    Drug/Food Interactions Statins/Certain Fruits      Personal Nutrition Goals   Nutrition Goal Patient to identify strategies for reducing cardiovascular risk by attending the [facility] education and nutrition series    Personal Goal #2 Patient to improve diet quality by using the plate method  as a guide for meal planning to including lean protein/plant protein, fruits, vegetables, whole gains and nonfat dairy.    Personal Goal #3 Patient to reduce sodium intake to '1500mg'$  per day    Comments Goals in contemplative stage. Christopher Gutierrez remains motivated to make dietary changes and continues to attend the [facility] education series regularly. He has missed classes this week and will miss classes next week due to dental issue.Christopher Gutierrez admits being to  slow to adopt/implement changes; however, he has a plan to begin cooking more at home and reduce convenience foods/takeout foods. He does continue regular follow-up with Conception Chancy, PysD for behavioral health counseling. Christopher Gutierrez will continue to benefit from participation in intensive cardiac rehab for nutrition, exercise, and lifestyle modification.      Intervention Plan   Intervention Prescribe, educate and counsel regarding individualized specific dietary modifications aiming towards targeted core components such as weight, hypertension, lipid management, diabetes, heart failure and other comorbidities.;Nutrition handout(s) given to patient.    Expected Outcomes Short Term Goal: Understand basic principles of dietary content, such as calories, fat, sodium, cholesterol and nutrients.;Long Term Goal: Adherence to prescribed nutrition plan.             Nutrition Discharge:   Education Questionnaire Score:   Christopher Gutierrez completed  Intensive cardiac rehab program on 09/13/22  with completion of  36 exercise and education sessions. Pt maintained good attendance and progressed nicely during their participation in rehab as evidenced by increased MET level. Christopher Gutierrez did not come back to complete his post exercise walk test as he had a abscessed tooth and  dental extraction. Christopher Gave RN BSN

## 2022-10-11 NOTE — Telephone Encounter (Signed)
Left message to call cardiac rehab. Will discharge from cardiac rehab at this time. Christopher Gutierrez's last day of attendance was on 09/13/22 will discharge from cardiac rehab at this time due to nonattendance.Harrell Gave RN BSN

## 2022-10-18 ENCOUNTER — Other Ambulatory Visit: Payer: Self-pay | Admitting: Cardiology

## 2022-10-18 DIAGNOSIS — I4891 Unspecified atrial fibrillation: Secondary | ICD-10-CM

## 2022-10-18 NOTE — Telephone Encounter (Signed)
Prescription refill request for Eliquis received.  Indication: afib  Last office visit: Christopher Gutierrez 07/23/2022 Scr: 1.11, 08/01/2022 Age: 62 yo  Weight: 96.2 kg   Refill sent.

## 2022-10-22 ENCOUNTER — Ambulatory Visit: Payer: Commercial Managed Care - PPO | Admitting: Psychologist

## 2022-10-23 ENCOUNTER — Ambulatory Visit (HOSPITAL_COMMUNITY)
Admission: RE | Admit: 2022-10-23 | Discharge: 2022-10-23 | Disposition: A | Discharge status: Home / Self Care | Payer: Commercial Managed Care - PPO | Source: Ambulatory Visit | Attending: Cardiovascular Disease | Admitting: Cardiovascular Disease

## 2022-10-23 DIAGNOSIS — I739 Peripheral vascular disease, unspecified: Secondary | ICD-10-CM | POA: Insufficient documentation

## 2022-10-23 LAB — VAS US ABI WITH/WO TBI
Left ABI: 1.11
Right ABI: 0.76

## 2022-10-25 ENCOUNTER — Other Ambulatory Visit: Payer: Self-pay | Admitting: *Deleted

## 2022-10-25 DIAGNOSIS — I739 Peripheral vascular disease, unspecified: Secondary | ICD-10-CM

## 2022-10-28 ENCOUNTER — Ambulatory Visit: Payer: Commercial Managed Care - PPO | Admitting: Psychologist

## 2022-11-11 ENCOUNTER — Other Ambulatory Visit: Payer: Self-pay | Admitting: Cardiology

## 2022-11-17 NOTE — Progress Notes (Signed)
Cardiology Office Note:    Date:  11/19/2022   ID:  Christopher Gutierrez, DOB [birth year], MRN [id number]  PCP:  Everrett Coombe, DO  Cardiologist:  Little Ishikawa, MD  Electrophysiologist:  Regan Lemming, MD   Referring MD: Everrett Coombe, DO   Chief Complaint  Patient presents with   Coronary Artery Disease    History of Present Illness:    Christopher Gutierrez is a 62 y.o. male with a hx of HTN, tobacco use, alcohol use who presents for follow-up.  He was referred by Dr. Ashley Royalty for evaluation of atrial fibrillation.  At initial clinic visit on 10/27/20, he appeared hypervolemic on exam and was in A. fib with rates up to 160s.  Discussed admission to the hospital for diuresis, rate control of his A. fib and eventual TEE/DCCV, but he declined.  At follow-up visit on 10/30/2020 he appeared more volume overloaded on direct admission was arranged.  Echo showed EF 20 to 25%, reduced RV function.  He had good response to IV diuretics and underwent successful TEE/DCCV on 11/01/2020 with restoration of sinus rhythm.  Initially followed with Dr. Gala Romney in advanced heart failure after his hospitalization.  He was started on Entresto, spironolactone, and bisoprolol.  Repeat echocardiogram 01/26/2021 showed significant improvement in EF to 45 to 50%, and normal RV function.  Coronary CTA was done on 12/04/2021, which showed obstructive CAD in mid RCA.  LHC 12/24/2021 showed severe mid RCA stenosis status post DES.  Since last clinic visit, reports he is doing okay.  Recently had dental abscess pulled.  Reports he lost his job today.  He is not sure what he is going to do about insurance.  Denies any chest pain, dyspnea, lower extremity edema, or palpitations.  Reports some lightheadedness with standing.  Continues to smoke, about 5 cigarettes/day.  Drinks about 10 beers per week.     Wt Readings from Last 3 Encounters:  11/19/22 210 lb 6.4 oz (95.4 kg)  08/20/22 212 lb (96.2 kg)   07/23/22 215 lb (97.5 kg)   BP Readings from Last 3 Encounters:  11/19/22 112/80  08/20/22 126/80  07/23/22 118/78     Past Medical History:  Diagnosis Date   Atrial fibrillation 10/24/2020   Atrial fibrillation and flutter    CHF (congestive heart failure)    Coronary artery disease    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   Hyperlipidemia    Hypertension     Past Surgical History:  Procedure Laterality Date   ABDOMINAL AORTOGRAM W/LOWER EXTREMITY N/A 03/07/2021   Procedure: ABDOMINAL AORTOGRAM W/LOWER EXTREMITY;  Surgeon: Iran Ouch, MD;  [facility]: [facility];  Service: Cardiovascular;  Laterality: N/A;   ATRIAL FIBRILLATION ABLATION N/A 05/16/2022   Procedure: ATRIAL FIBRILLATION ABLATION;  Surgeon: Regan Lemming, MD;  Location: [facility];  Service: Cardiovascular;  Laterality: N/A;   CARDIAC CATHETERIZATION     CARDIOVERSION N/A 11/01/2020   Procedure: CARDIOVERSION;  Surgeon: Jake Bathe, MD;  Location: [facility]OS[facility];  Service: Cardiovascular;  Laterality: N/A;   CATARACT EXTRACTION Right    CORONARY STENT INTERVENTION N/A 12/24/2021   Procedure: CORONARY STENT INTERVENTION;  Surgeon: Yvonne Kendall, MD;  Location: [facility];  Service: Cardiovascular;  Laterality: N/A;   LEFT HEART CATH AND CORONARY ANGIOGRAPHY N/A 12/24/2021   Procedure: LEFT HEART CATH AND CORONARY ANGIOGRAPHY;  Surgeon: Yvonne Kendall, MD;  Location: [facility];  Service: Cardiovascular;  Laterality: N/A;   MOUTH SURGERY  2013   PERIPHERAL VASCULAR INTERVENTION Left 03/07/2021   Procedure: PERIPHERAL VASCULAR INTERVENTION;  Surgeon: Iran Ouch, MD;  Location: [facility];  Service: Cardiovascular;  Laterality: Left;  left SFA   TEE WITHOUT CARDIOVERSION N/A 11/01/2020   Procedure: TRANSESOPHAGEAL ECHOCARDIOGRAM (TEE);  Surgeon: Jake Bathe, MD;  Location: [facility] ENDOSCOPY;  Service: Cardiovascular;  Laterality:  N/A;    Current Medications: Current Meds  Medication Sig   acetaminophen (TYLENOL) 500 MG tablet Take 500 mg by mouth every 6 (six) hours as needed for moderate pain.   apixaban (ELIQUIS) 5 MG TABS tablet TAKE 1 TABLET BY MOUTH TWICE A DAY   aspirin EC 81 MG tablet Take 1 tablet (81 mg total) by mouth daily. Swallow whole.   bisoprolol (ZEBETA) 10 MG tablet Take 1 tablet (10 mg total) by mouth daily.   cetirizine (ZYRTEC) 10 MG tablet Take 10 mg by mouth as needed for allergies.   dapagliflozin propanediol (FARXIGA) 10 MG TABS tablet Take 1 tablet (10 mg total) by mouth daily before breakfast. NEEDS FOLLOW UP APPOINTMENT FOR ANYMORE REFILLS   ENTRESTO 97-103 MG TAKE 1 TABLET BY MOUTH TWICE A DAY   ezetimibe (ZETIA) 10 MG tablet Take 1 tablet (10 mg total) by mouth daily.   famotidine-calcium carbonate-magnesium hydroxide (PEPCID COMPLETE) 10-800-165 MG chewable tablet Chew 1 tablet by mouth daily as needed (acid reflux).   hydrALAZINE (APRESOLINE) 25 MG tablet TAKE 1 TABLET BY MOUTH IN THE MORNING AND AT BEDTIME   Polyvinyl Alcohol-Povidone (REFRESH OP) Place 1 drop into both eyes daily as needed (dry eyes).   rosuvastatin (CRESTOR) 40 MG tablet Take 1 tablet (40 mg total) by mouth daily.   SIMBRINZA 1-0.2 % SUSP Apply 1 drop to eye 2 (two) times daily.   spironolactone (ALDACTONE) 25 MG tablet TAKE 1/2 TABLET BY MOUTH EVERY DAY   [DISCONTINUED] clopidogrel (PLAVIX) 75 MG tablet TAKE 1 TABLET BY MOUTH EVERY DAY     Allergies:   Beta adrenergic blockers and Tape   Social History   Socioeconomic History   Marital status: Divorced    Spouse name: Not on file   Number of children: 1   Years of education: Not on file   Highest education level: Master's degree (e.g., MA, MS, MEng, MEd, MSW, MBA)  Occupational History   Occupation: unemployed  Tobacco Use   Smoking status: Every Day    Packs/day: 1.50    Years: 25.00    Additional pack years: 0.00    Total pack years: 37.50     Types: Cigarettes   Smokeless tobacco: Current    Types: Chew   Tobacco comments:    07/18/22 Smokes 1/2 ppd not ready to quit given smoking cessation information  Vaping Use   Vaping Use: Never used  Substance and Sexual Activity   Alcohol use: Yes    Alcohol/week: 24.0 standard drinks of alcohol    Types: 24 Cans of beer per week    Comment: 6 cans every other day 05/31/22   Drug use: Not Currently    Frequency: 2.0 times per week    Types: Marijuana    Comment: he took Marijuana 2 weeks ago   Sexual activity: Yes    Partners: Female  Other Topics Concern   Not on file  Social History Narrative   Not on file   Social Determinants of Health   Financial Resource Strain: Low Risk  (11/01/2020)   Overall  Financial Resource Strain (CARDIA)    Difficulty of Paying Living Expenses: Not hard at all  Food Insecurity: No Food Insecurity (11/01/2020)   Hunger Vital Sign    Worried About Running Out of Food in the Last Year: Never true    Ran Out of Food in the Last Year: Never true  Transportation Needs: No Transportation Needs (11/01/2020)   PRAPARE - Administrator, Civil Service (Medical): No    Lack of Transportation (Non-Medical): No  Physical Activity: Not on file  Stress: Not on file  Social Connections: Not on file     Family History: The patient's family history includes Birth defects in his paternal aunt; COPD in his maternal uncle; Gout in his brother; Hypertension in his brother; Stroke in his paternal aunt.  ROS:   Please see the history of present illness.     All other systems reviewed and are negative.  EKGs/Labs/Other Studies Reviewed:    The following studies were reviewed today:   EKG:   11/19/22: Normal sinus rhythm, rate 73, PVC, Q waves in V1/2 07/23/2022: Normal sinus rhythm, rate 64, Q waves in V1/2 04/25/2022: Normal sinus rhythm, rate 67, Q waves in V1/2 12/13/2021: Sinus rhythm, rate 71, PVC, poor R wave progression  Recent  Labs: 05/31/2022: B Natriuretic Peptide 621.5; Hemoglobin 18.5; Magnesium 2.3; Platelets 238 08/01/2022: ALT 39; BUN 14; Creatinine, Ser 1.11; Potassium 4.4; Sodium 137; TSH 5.400  Recent Lipid Panel    Component Value Date/Time   CHOL 143 04/25/2022 1543   TRIG 102 04/25/2022 1543   HDL 65 04/25/2022 1543   CHOLHDL 2.2 04/25/2022 1543   CHOLHDL 3.3 01/26/2021 1213   VLDL 21 01/26/2021 1213   LDLCALC 59 04/25/2022 1543    Physical Exam:    VS:  BP 112/80 (BP Location: Left Arm, Patient Position: Sitting, Cuff Size: Normal)   Pulse 73   Ht 6\' 2"  (1.88 m)   Wt 210 lb 6.4 oz (95.4 kg)   SpO2 97%   BMI 27.01 kg/m     Wt Readings from Last 3 Encounters:  11/19/22 210 lb 6.4 oz (95.4 kg)  08/20/22 212 lb (96.2 kg)  07/23/22 215 lb (97.5 kg)     GEN:  in no acute distress HEENT: Normal NECK: No JVD CARDIAC: RRR no murmurs RESPIRATORY:  Clear to auscultation without rales, wheezing or rhonchi  ABDOMEN: Soft, non-tender, non-distended MUSCULOSKELETAL: No edema; No deformity  SKIN: Warm and dry NEUROLOGIC:  Alert and oriented x 3 PSYCHIATRIC:  Normal affect   ASSESSMENT:    1. CAD in native artery   2. Chronic combined systolic and diastolic congestive heart failure   3. Persistent atrial fibrillation   4. Essential hypertension   5. Alcohol use   6. Tobacco use      PLAN:    CAD: Reported exertional chest pain, coronary CTA was done on 12/04/2021, which showed obstructive CAD in mid RCA  [place] 12/24/2021 showed severe mid RCA stenosis status post DES. -Continue Eliquis -Given has been nearly 1 year since his PCI, can switch from Plavix to aspirin 81 mg daily -Continue statin  -Continue bisoprol -Completed cardiac rehab  Chronic combined systolic and diastolic heart failure: New diagnosis in the setting of afib w/rvr, alcohol use and heavy smoking history. 10/2020 EF 20%, BIV failure. ECHO 01/26/21 with significant improvement in EF 45-50%, normal RV function.  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 -NYHA Class II symptoms, euvolemic on exam -continue spironolactone 25 mg daily.   -  continue entresto 97/103 -continue bisoprolol 10 mg daily -Continue Farxiga 10 mg daily -Continue hydralazine 25 mg twice daily -Check BMET, Mag   Persistent Afib: new diagnosis 10/2020. S/p TEE/DCCV 11/01/20.  Underwent cardioversion in ED for atrial flutter 08/2021.  He was referred to EP and underwent ablation with Dr. Elberta Fortis 05/2022.  He underwent DCCV in ED 05/31/2022 and started on amiodarone. -Amiodarone discontinued 08/2022.  Appears to be maintaining sinus rhythm -CHADS2VASC 3, continue eliquis -continue bisoprolol  -Check sleep study  Tobacco Use: Counseled on the risks of tobacco use and cessation strongly encouraged.   Polycythemia: Likely secondary to smoking. Hematology following. Jak 2 negative   Alcohol Use: Had cut back on alcohol but now drinking approximately 10 beers per week.  Encouraged cessation  Hypertension: Continue Entresto, bisoprolol, hydralazine, and spironolactone as above  PAD: Reported severe left calf claudication with rest pain and ischemic changes in toes.  Angiography 03/2021 showed no significant aortoiliac disease on the left with distal SFA occlusion into the proximal popliteal artery, severe focal stenosis in distal right SFA.  Follows with Dr. Kirke Corin, underwent successful angioplasty and stent placement to left SFA into proximal popliteal artery.  Postprocedure ABIs showed improvement with normal ABIs on left, mild disease (0.81) on right -Continue Eliquis -Continue rosuvastatin -Smoking cessation recommended  Hyperlipidemia: LDL 95 on 11/22/2021, on rosuvastatin 20 mg daily.  Rosuvastatin increased to 40 mg daily.  LDL remain 95 on 01/16/2022, Zetia 10 mg daily added.  LDL 59 on 04/25/22  Daytime somnolence: Check [place] sleep study  Will ask our social worker to see him given he lost his job will be losing insurance   RTC in 4  months   Medication Adjustments/Labs and Tests Ordered: Current medicines are reviewed at length with the patient today.  Concerns regarding medicines are outlined above.  Orders Placed This Encounter  Procedures   Basic metabolic panel   Magnesium   Referral to HRT/VAS Care Navigation   EKG 12-Lead   Meds ordered this encounter  Medications   nitroGLYCERIN (NITROSTAT) 0.4 MG SL tablet    Sig: Place 1 tablet (0.4 mg total) under the tongue every 5 (five) minutes as needed.    Dispense:  25 tablet    Refill:  3   aspirin EC 81 MG tablet    Sig: Take 1 tablet (81 mg total) by mouth daily. Swallow whole.    Dispense:  90 tablet    Refill:  3    Patient Instructions  Medication Instructions:  STOP Plavix START aspirin 81 mg daily  *If you need a refill on your cardiac medications before your next appointment, please call your pharmacy*   Lab Work: BMET, Mag today  If you have labs (blood work) drawn today and your tests are completely normal, you will receive your results only by: MyChart Message (if you have MyChart) OR A paper copy in the mail If you have any lab test that is abnormal or we need to change your treatment, we will call you to review the results.  Testing/Procedures: Your physician has requested that you have an echocardiogram. Echocardiography is a painless test that uses sound waves to create images of your heart. It provides your doctor with information about the size and shape of your heart and how well your heart's chambers and valves are working. This procedure takes approximately one hour. There are no restrictions for this procedure. Please do NOT wear cologne, perfume, aftershave, or lotions (deodorant is allowed). Please arrive 73  minutes prior to your appointment time.  Follow-Up: At [facility], you and your health needs are our priority.  As part of our continuing mission to provide you with exceptional heart care, we have created  designated Provider Care Teams.  These Care Teams include your primary Cardiologist (physician) and Advanced Practice Providers (APPs -  Physician Assistants and Nurse Practitioners) who all work together to provide you with the care you need, when you need it.  We recommend signing up for the patient portal called "MyChart".  Sign up information is provided on this After Visit Summary.  MyChart is used to connect with patients for Virtual Visits (Telemedicine).  Patients are able to view lab/test results, encounter notes, upcoming appointments, etc.  Non-urgent messages can be sent to your provider as well.   To learn more about what you can do with MyChart, go to [email or web address].    Your next appointment:   4 month(s)  Provider:   Little Ishikawa, MD        Signed, Little Ishikawa, MD  11/19/2022 5:46 PM    [facility]

## 2022-11-19 ENCOUNTER — Encounter: Payer: Self-pay | Admitting: Cardiology

## 2022-11-19 ENCOUNTER — Ambulatory Visit: Payer: Commercial Managed Care - PPO | Attending: Cardiology | Admitting: Cardiology

## 2022-11-19 VITALS — BP 112/80 | HR 73 | Ht 74.0 in | Wt 210.4 lb

## 2022-11-19 DIAGNOSIS — Z720 Tobacco use: Secondary | ICD-10-CM

## 2022-11-19 DIAGNOSIS — I4819 Other persistent atrial fibrillation: Secondary | ICD-10-CM | POA: Diagnosis not present

## 2022-11-19 DIAGNOSIS — Z789 Other specified health status: Secondary | ICD-10-CM

## 2022-11-19 DIAGNOSIS — I2510 Atherosclerotic heart disease of native coronary artery without angina pectoris: Secondary | ICD-10-CM

## 2022-11-19 DIAGNOSIS — I5042 Chronic combined systolic (congestive) and diastolic (congestive) heart failure: Secondary | ICD-10-CM

## 2022-11-19 DIAGNOSIS — I10 Essential (primary) hypertension: Secondary | ICD-10-CM | POA: Diagnosis not present

## 2022-11-19 MED ORDER — NITROGLYCERIN 0.4 MG SL SUBL: 0.4000 mg | SUBLINGUAL_TABLET | SUBLINGUAL | 3 refills | Status: AC | PRN

## 2022-11-19 MED ORDER — ASPIRIN 81 MG PO TBEC: 81.0000 mg | DELAYED_RELEASE_TABLET | Freq: Every day | ORAL | 3 refills | Status: DC

## 2022-11-19 NOTE — Patient Instructions (Signed)
Medication Instructions:  STOP Plavix START aspirin 81 mg daily  *If you need a refill on your cardiac medications before your next appointment, please call your pharmacy*   Lab Work: BMET, Mag today  If you have labs (blood work) drawn today and your tests are completely normal, you will receive your results only by: MyChart Message (if you have MyChart) OR A paper copy in the mail If you have any lab test that is abnormal or we need to change your treatment, we will call you to review the results.  Testing/Procedures: Your physician has requested that you have an echocardiogram. Echocardiography is a painless test that uses sound waves to create images of your heart. It provides your doctor with information about the size and shape of your heart and how well your heart's chambers and valves are working. This procedure takes approximately one hour. There are no restrictions for this procedure. Please do NOT wear cologne, perfume, aftershave, or lotions (deodorant is allowed). Please arrive 15 minutes prior to your appointment time.  Follow-Up: At [facility], you and your health needs are our priority.  As part of our continuing mission to provide you with exceptional heart care, we have created designated Provider Care Teams.  These Care Teams include your primary Cardiologist (physician) and Advanced Practice Providers (APPs -  Physician Assistants and Nurse Practitioners) who all work together to provide you with the care you need, when you need it.  We recommend signing up for the patient portal called "MyChart".  Sign up information is provided on this After Visit Summary.  MyChart is used to connect with patients for Virtual Visits (Telemedicine).  Patients are able to view lab/test results, encounter notes, upcoming appointments, etc.  Non-urgent messages can be sent to your provider as well.   To learn more about what you can do with MyChart, go to [email or web address].     Your next appointment:   4 month(s)  Provider:   Little Ishikawa, MD

## 2022-11-20 ENCOUNTER — Telehealth (HOSPITAL_COMMUNITY): Payer: Self-pay | Admitting: Licensed Clinical Social Worker

## 2022-11-20 NOTE — Telephone Encounter (Signed)
CSW referred to assist patient with options for insurance. Patient reportedly lost his job this week and asking for some options. CSW contacted patient and left message for return call. Lasandra Beech, LCSW, CCSW-MCS [phone]

## 2022-11-21 LAB — BASIC METABOLIC PANEL
BUN/Creatinine Ratio: 12 (ref 9–20)
BUN: 15 mg/dL (ref 6–24)
CO2: 21 mmol/L (ref 20–29)
Calcium: 9.5 mg/dL (ref 8.7–10.2)
Chloride: 103 mmol/L (ref 96–106)
Creatinine, Ser: 1.27 mg/dL (ref 0.76–1.27)
Glucose: 94 mg/dL (ref 70–99)
Potassium: 4.7 mmol/L (ref 3.5–5.2)
Sodium: 139 mmol/L (ref 134–144)
eGFR: 65 mL/min/{1.73_m2} (ref >59)

## 2022-11-21 LAB — MAGNESIUM: Magnesium: 2.3 mg/dL (ref 1.6–2.3)

## 2022-11-23 ENCOUNTER — Other Ambulatory Visit: Payer: Self-pay | Admitting: Cardiology

## 2022-11-25 ENCOUNTER — Other Ambulatory Visit: Payer: Self-pay | Admitting: Nurse Practitioner

## 2022-11-25 ENCOUNTER — Other Ambulatory Visit (HOSPITAL_COMMUNITY): Payer: Self-pay

## 2022-11-25 ENCOUNTER — Other Ambulatory Visit: Payer: Self-pay | Admitting: Cardiology

## 2022-11-25 ENCOUNTER — Telehealth: Payer: Self-pay | Admitting: Licensed Clinical Social Worker

## 2022-11-25 NOTE — Telephone Encounter (Signed)
Rx request sent to pharmacy.  

## 2022-11-25 NOTE — Progress Notes (Signed)
Heart and Vascular Care Navigation  11/25/2022  Christopher Gutierrez 1961/05/01 [id number]  Reason for Referral: loss of employment, concern about loss of insurance.  Patient is participating in a Managed Medicaid Plan:No, [facility] commercial plan only  Engaged with patient by telephone for initial visit for Heart and Vascular Care Coordination.                                                                                                   Assessment:     LCSW spoke with pt this morning at [phone]. Introduced self, role, reason for call. Pt confirmed home address, PCP, and that he currently still has insurance but has lost his job. He thinks he will be eligible for COBRA but isnt sure about cost or duration. Pt currently has his medications, he was encouraged to contact pharmacy and request refills before the end of the month if able. Pt had concerns about Eliquis not being able to be filled, but after I have spoken with pharmtech it appears pt has three bottles that should last him through June.   If not COBRA eligible we discussed alternative coverage options including Marketplace insurance (eligible to enroll out of window bc of loss of job), Medicaid, [employer]. Medications have PAP or alternate assistance we can assist with, and many can be affordably filled at [facility] if needed. Pt interested in applying for unemployment benefits.   Otherwise appreciative of check in, I will mail him resources discussed and f/u to ensure they are received.                                   HRT/VAS Care Coordination     Patients Home Cardiology Office [facility]   Outpatient Care Team Social Worker   Social Worker Name: Octavio Graves, [place], [phone]   Living arrangements for the past 2 months Single Family Home   Lives with: Self   Patient Current Insurance Scientist, water quality   Patient Has Concern With Paying Medical Bills Yes   Patient Concerns With  Medical Bills lost employment, unsure about insurance coverage ending   Medical Bill Referrals: Radio broadcast assistant, [place] flyer, [facility] Financial Assistance   Does Patient Have Prescription Coverage? Yes   Home Assistive Devices/Equipment None       Social History:                                                                             SDOH Screenings   Food Insecurity: No Food Insecurity (11/25/2022)  Housing: Low Risk  (11/25/2022)  Transportation Needs: No Transportation Needs (11/25/2022)  Utilities: Not At Risk (11/25/2022)  Alcohol Screen: Medium Risk (11/01/2020)  Depression (PHQ2-9): Low Risk  (07/18/2022)  Financial Resource Strain:  Medium Risk (11/25/2022)  Tobacco Use: High Risk (11/19/2022)    SDOH Interventions: Financial Resources:  Financial Strain Interventions: Other (Comment) (lost job- sent information about applying for unemployment, how to manage if he needs ongoing coverage for medications etc.) Editor, commissioning for [employer] and Unemployment information  Food Insecurity:  Food Insecurity Interventions: Intervention Not Indicated  Housing Insecurity:  Housing Interventions: Intervention Not Indicated  Transportation:   Transportation Interventions: Intervention Not Indicated    Other Care Navigation Interventions:     Provided Pharmacy assistance resources  Discussed filling medications for 90 days before insurance terminates, discussed PAP and other options if no longer covered moving forward.    Follow-up plan:   LCSW has mailed pt the following: my card, CAFA, Marketplace navigator flyer, information about how to apply for unemployment benefits. I will f/u to ensure they are received and answer any additional questions/concerns.

## 2022-11-26 ENCOUNTER — Other Ambulatory Visit: Payer: Self-pay

## 2022-11-26 MED ORDER — DAPAGLIFLOZIN PROPANEDIOL 10 MG PO TABS: 10.0000 mg | ORAL_TABLET | Freq: Every day | ORAL | 3 refills | Status: DC

## 2022-11-27 ENCOUNTER — Inpatient Hospital Stay: Payer: Commercial Managed Care - PPO | Attending: Oncology

## 2022-11-27 ENCOUNTER — Inpatient Hospital Stay (HOSPITAL_BASED_OUTPATIENT_CLINIC_OR_DEPARTMENT_OTHER): Payer: Commercial Managed Care - PPO | Admitting: Oncology

## 2022-11-27 VITALS — BP 135/75 | HR 63 | Temp 98.1°F | Resp 18 | Ht 74.0 in | Wt 211.5 lb

## 2022-11-27 DIAGNOSIS — D751 Secondary polycythemia: Secondary | ICD-10-CM | POA: Insufficient documentation

## 2022-11-27 DIAGNOSIS — I110 Hypertensive heart disease with heart failure: Secondary | ICD-10-CM | POA: Diagnosis not present

## 2022-11-27 DIAGNOSIS — F17200 Nicotine dependence, unspecified, uncomplicated: Secondary | ICD-10-CM | POA: Insufficient documentation

## 2022-11-27 DIAGNOSIS — I509 Heart failure, unspecified: Secondary | ICD-10-CM | POA: Insufficient documentation

## 2022-11-27 DIAGNOSIS — I4891 Unspecified atrial fibrillation: Secondary | ICD-10-CM | POA: Diagnosis not present

## 2022-11-27 LAB — CBC WITH DIFFERENTIAL (CANCER CENTER ONLY)
Abs Immature Granulocytes: 0.09 10*3/uL — ABNORMAL HIGH (ref 0.00–0.07)
Basophils Absolute: 0.1 10*3/uL (ref 0.0–0.1)
Basophils Relative: 1 %
Eosinophils Absolute: 0.1 10*3/uL (ref 0.0–0.5)
Eosinophils Relative: 1 %
HCT: 51.7 % (ref 39.0–52.0)
Hemoglobin: 17.4 g/dL — ABNORMAL HIGH (ref 13.0–17.0)
Immature Granulocytes: 1 %
Lymphocytes Relative: 19 %
Lymphs Abs: 1.9 10*3/uL (ref 0.7–4.0)
MCH: 34.2 pg — ABNORMAL HIGH (ref 26.0–34.0)
MCHC: 33.7 g/dL (ref 30.0–36.0)
MCV: 101.6 fL — ABNORMAL HIGH (ref 80.0–100.0)
Monocytes Absolute: 1 10*3/uL (ref 0.1–1.0)
Monocytes Relative: 10 %
Neutro Abs: 6.5 10*3/uL (ref 1.7–7.7)
Neutrophils Relative %: 68 %
Platelet Count: 170 10*3/uL (ref 150–400)
RBC: 5.09 MIL/uL (ref 4.22–5.81)
RDW: 12.6 % (ref 11.5–15.5)
WBC Count: 9.6 10*3/uL (ref 4.0–10.5)
nRBC: 0 % (ref 0.0–0.2)

## 2022-11-27 NOTE — Progress Notes (Signed)
  [facility] OFFICE PROGRESS NOTE   Diagnosis: Erythrocytosis  INTERVAL HISTORY:   Mr. Fikes returns as scheduled.  He reports eye erythema from drops he uses for glaucoma.  He recently broke a tooth and is taking an antibiotic.  He continues smoking.  He reports he has decreased alcohol use, but he continues to drink up to 6 beers some days.  Objective:  Vital signs in last 24 hours:  Blood pressure 135/75, pulse 63, temperature 98.1 F (36.7 C), temperature source Oral, resp. rate 18, height  (1.88 m), weight 211 lb 8 oz (95.9 kg), SpO2 100 %.    HEENT: Mild conjunctival erythema Resp: Distant breath sounds, no respiratory distress Cardio: Regular rate and rhythm GI: No hepatosplenomegaly Vascular: No leg edema, the left lower leg is slightly larger than the right side (he reports this has been a finding since he underwent peripheral artery stent placement)     Lab Results:  Lab Results  Component Value Date   WBC 9.6 11/27/2022   HGB 17.4 (H) 11/27/2022   HCT 51.7 11/27/2022   MCV 101.6 (H) 11/27/2022   PLT 170 11/27/2022   NEUTROABS 6.5 11/27/2022    CMP  Lab Results  Component Value Date   NA 139 11/20/2022   K 4.7 11/20/2022   CL 103 11/20/2022   CO2 21 11/20/2022   GLUCOSE 94 11/20/2022   BUN 15 11/20/2022   CREATININE 1.27 11/20/2022   CALCIUM 9.5 11/20/2022   PROT 6.5 08/01/2022   ALBUMIN 4.6 08/01/2022   AST 25 08/01/2022   ALT 39 08/01/2022   ALKPHOS 78 08/01/2022   BILITOT 1.1 08/01/2022   GFRNONAA 53 (L) 05/31/2022    No results found for: "CEA1", "CEA", "ZOX096", "CA125"  Lab Results  Component Value Date   INR 1.1 05/31/2022   LABPROT 14.0 05/31/2022    Imaging:  No results found.  Medications: I have reviewed the patient's current medications.   Assessment/Plan:  Erythrocytosis, likely secondary to cardiovascular disease, ongoing tobacco use and potentially Aldactone 11/30/2020 JAK2 V617F testing  negative   Hypertension Atrial fibrillation CHF Alcohol and tobacco use    Disposition: Mr. List has chronic erythrocytosis.  The hemoglobin has not changed significantly over the past year.  The erythrocytosis is likely secondary to tobacco use, heart disease, heavy alcohol use, and spironolactone.  I have a low clinical suspicion for a myeloproliferative disorder.  The elevated MCV is likely secondary to alcohol use.  We discussed discharge from hematology clinic.  He would like to continue follow-up here.  He will return for an office visit and CBC in 1 year.  Thornton Papas, MD  11/27/2022  12:06 PM

## 2022-12-03 ENCOUNTER — Telehealth: Payer: Self-pay | Admitting: Licensed Clinical Social Worker

## 2022-12-03 NOTE — Telephone Encounter (Signed)
H&V Care Navigation CSW Progress Note  Clinical Social Worker contacted patient by phone to f/u on assistance information mailed to him. No answer this afternoon at [phone], left voicemail requesting return call. Will re-attempt as able.  Patient is participating in a Managed Medicaid Plan:  No, UHC, may term since pt lost employment  SDOH Screenings   Food Insecurity: No Food Insecurity (11/25/2022)  Housing: Low Risk  (11/25/2022)  Transportation Needs: No Transportation Needs (11/25/2022)  Utilities: Not At Risk (11/25/2022)  Alcohol Screen: Medium Risk (11/01/2020)  Depression (PHQ2-9): Low Risk  (07/18/2022)  Financial Resource Strain: Medium Risk (11/25/2022)  Tobacco Use: High Risk (11/19/2022)   Octavio Graves, MSW, LCSW Clinical Social Worker II [facility] Heart/Vascular Care Navigation  [phone]- work cell phone (preferred) [phone]- desk phone

## 2022-12-12 ENCOUNTER — Ambulatory Visit (HOSPITAL_COMMUNITY): Payer: Commercial Managed Care - PPO

## 2022-12-31 ENCOUNTER — Other Ambulatory Visit (HOSPITAL_COMMUNITY): Payer: Self-pay | Admitting: Cardiology

## 2022-12-31 ENCOUNTER — Ambulatory Visit (HOSPITAL_COMMUNITY): Payer: Commercial Managed Care - PPO | Attending: Internal Medicine

## 2022-12-31 DIAGNOSIS — I2510 Atherosclerotic heart disease of native coronary artery without angina pectoris: Secondary | ICD-10-CM

## 2022-12-31 LAB — ECHOCARDIOGRAM COMPLETE
AR max vel: 3.03 cm2
AV Area VTI: 3.08 cm2
AV Area mean vel: 2.92 cm2
AV Mean grad: 3 mmHg
AV Peak grad: 6.2 mmHg
Ao pk vel: 1.24 m/s
Area-P 1/2: 3.27 cm2
S' Lateral: 1.7 cm

## 2023-01-02 ENCOUNTER — Telehealth (HOSPITAL_BASED_OUTPATIENT_CLINIC_OR_DEPARTMENT_OTHER): Payer: Self-pay | Admitting: Licensed Clinical Social Worker

## 2023-01-02 NOTE — Telephone Encounter (Signed)
H&V Care Navigation CSW Progress Note  Clinical Social Worker contacted patient by phone to f/u on previously stated insurance concerns. No answer this afternoon, note [employer] still on chart at this time. Left voicemail requesting return call if additional questions/concerns arise.  Patient is participating in a Managed Medicaid Plan:  No, [employer] commercial plan only  SDOH Screenings   Food Insecurity: No Food Insecurity (11/25/2022)  Housing: Low Risk  (11/25/2022)  Transportation Needs: No Transportation Needs (11/25/2022)  Utilities: Not At Risk (11/25/2022)  Alcohol Screen: Medium Risk (11/01/2020)  Depression (PHQ2-9): Low Risk  (07/18/2022)  Financial Resource Strain: Medium Risk (11/25/2022)  Tobacco Use: High Risk (11/19/2022)   Octavio Graves, MSW, LCSW Clinical Social Worker II [facility] Heart/Vascular Care Navigation  [phone]- work cell phone (preferred) [phone]- desk phone

## 2023-02-24 ENCOUNTER — Ambulatory Visit (HOSPITAL_COMMUNITY)
Admission: RE | Admit: 2023-02-24 | Discharge: 2023-02-24 | Disposition: A | Discharge status: Home / Self Care | Payer: Commercial Managed Care - PPO | Source: Ambulatory Visit | Attending: Physician Assistant | Admitting: Physician Assistant

## 2023-02-24 ENCOUNTER — Encounter (HOSPITAL_COMMUNITY): Payer: Self-pay | Admitting: Physician Assistant

## 2023-02-24 VITALS — BP 124/72 | HR 71 | Ht 74.0 in | Wt 207.2 lb

## 2023-02-24 DIAGNOSIS — I2510 Atherosclerotic heart disease of native coronary artery without angina pectoris: Secondary | ICD-10-CM | POA: Insufficient documentation

## 2023-02-24 DIAGNOSIS — Z955 Presence of coronary angioplasty implant and graft: Secondary | ICD-10-CM | POA: Insufficient documentation

## 2023-02-24 DIAGNOSIS — Z79899 Other long term (current) drug therapy: Secondary | ICD-10-CM | POA: Diagnosis not present

## 2023-02-24 DIAGNOSIS — I5020 Unspecified systolic (congestive) heart failure: Secondary | ICD-10-CM | POA: Diagnosis not present

## 2023-02-24 DIAGNOSIS — I4819 Other persistent atrial fibrillation: Secondary | ICD-10-CM | POA: Insufficient documentation

## 2023-02-24 DIAGNOSIS — Z7901 Long term (current) use of anticoagulants: Secondary | ICD-10-CM | POA: Insufficient documentation

## 2023-02-24 DIAGNOSIS — I4892 Unspecified atrial flutter: Secondary | ICD-10-CM | POA: Diagnosis present

## 2023-02-24 DIAGNOSIS — D6869 Other thrombophilia: Secondary | ICD-10-CM | POA: Diagnosis not present

## 2023-02-24 DIAGNOSIS — I110 Hypertensive heart disease with heart failure: Secondary | ICD-10-CM | POA: Diagnosis not present

## 2023-02-24 DIAGNOSIS — Z720 Tobacco use: Secondary | ICD-10-CM | POA: Diagnosis not present

## 2023-02-24 NOTE — Progress Notes (Signed)
Primary Care Physician: Everrett Coombe, DO Primary Cardiologist: Dr Bjorn Pippin  Primary Electrophysiologist: Dr Elberta Fortis Referring Physician: Dr Silvano Rusk Christopher Gutierrez is a 62 y.o. male with a history of HTN, tobacco use, systolic CHF, CAD, PAD, atrial flutter, atrial fibrillation who presents for follow up in the [facility]rial Fibrillation [facility]. Patient established with a new PCP Dr. Ashley Royalty due to ongoing leg pain and shortness of breath.  Ecg done in pcp office which showed afib with RVR and he was referred to cardiology Dr. Bjorn Pippin. Seen on 10/27/20, remained in afib w/RVR, was offered admission but declined and was started on carvedilol and Eliquis.  Symptoms worsened and he returned to clinic on 10/30/20 and was direct admitted for volume overload. Had an ECHO which demonstrated EF 20-25%, reduced RV function.  Started on IV diuretics with good response and received successful TEE/DCCV with restoration of NSR. Patient is on Eliquis for a CHADS2VASC score of 3. He required DCCV in the ED again 08/2021 and was referred to EP. He is s/p afib and flutter ablation with Dr Elberta Fortis on 05/16/22.   Patient called triage with elevated heart rates on 05/30/22 and was instructed to take an extra dose of bisoprolol and follow up in the AF clinic. At his AF clinic appointment his heart rates were in the 130-140 bpm with low BP and he was taken to the ED where he underwent DCCV and was started on amiodarone by EP. He maintained SR and amiodarone was discontinued 08/2022.  On follow up today, patient reports that he has done well since his last visit. He thinks he may have had one episode of afib after oral surgery but it was very brief. No bleeding issues on anticoagulation.   Today, he denies symptoms of palpitations, chest pain, orthopnea, PND, lower extremity edema, presyncope, syncope, snoring, daytime somnolence, bleeding, or neurologic sequela. The patient is tolerating medications  without difficulties and is otherwise without complaint today.    Atrial Fibrillation Risk Factors:  he does have symptoms or diagnosis of sleep apnea. he does not have a history of rheumatic fever. he does have a history of alcohol use.   Atrial Fibrillation Management history:  Previous antiarrhythmic drugs: amiodarone  Previous cardioversions: 11/01/20, 09/01/21, 05/31/22 Previous ablations: 05/17/22 Anticoagulation history: Eliquis   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   Hyperlipidemia    Hypertension    ROS- All systems are reviewed and negative except as per the HPI above.  Physical Exam: Vitals:   02/24/23 1358  BP: 124/72  Pulse: 71  Weight: 94 kg  Height: 6\' 2"  (1.88 m)    GEN: Well nourished, well developed in no acute distress NECK: No JVD; No carotid bruits CARDIAC: Regular rate and rhythm, no murmurs, rubs, gallops RESPIRATORY:  Clear to auscultation without rales, wheezing or rhonchi  ABDOMEN: Soft, non-tender, non-distended EXTREMITIES:  No edema; No deformity   Wt Readings from Last 3 Encounters:  02/24/23 94 kg  11/27/22 95.9 kg  11/19/22 95.4 kg    EKG today demonstrates  SR Vent. rate 71 BPM PR interval 146 ms QRS duration 86 ms QT/QTcB 390/423 ms  Echo 01/26/21 demonstrated   1. Compared with echo 10/2020, systolic function has improved.  2. Left ventricular ejection fraction, by estimation, is 45 to 50%. The  left ventricle has mildly decreased function. The left ventricle  demonstrates global hypokinesis. There is moderate concentric left  ventricular hypertrophy. Left ventricular diastolic parameters are consistent with Grade I diastolic dysfunction (impaired relaxation).   3. Right ventricular systolic function is normal.  The right ventricular  size is normal.   4. The mitral valve is normal in structure. Trivial mitral valve  regurgitation. No evidence of mitral stenosis.   5. The aortic valve is tricuspid. Aortic valve regurgitation is not  visualized. No aortic stenosis is present.   6. The inferior vena cava is normal in size with greater than 50%  respiratory variability, suggesting right atrial pressure of 3 mmHg.   Epic records are reviewed at length today  CHA2DS2-VASc Score = 3  The patient's score is based upon: CHF History: 1 HTN History: 1 Diabetes History: 0 Stroke History: 0 Vascular Disease History: 1 Age Score: 0 Gender Score: 0       ASSESSMENT AND PLAN: Persistent Atrial Fibrillation/atrial flutter The patient's CHA2DS2-VASc score is 3, indicating a 3.2% annual risk of stroke.   S/p afib and flutter ablation 05/17/22, now off amiodarone Patient appears to be maintaining SR Continue Eliquis 5 mg BID  Continue bisoprolol 10 mg daily  Secondary Hypercoagulable State (ICD10:  D68.69) The patient is at significant risk for stroke/thromboembolism based upon his CHA2DS2-VASc Score of 3.  Continue Apixaban (Eliquis).   Suspected obstructive sleep apnea Itamar sleep study not completed  CAD S/p PCI with DES to RCA No anginal symptoms  HFrEF EF improved from 20-25% to 45-50% with SR. Fluid status appears stable today  HTN Stable on current regimen   Follow up with Dr Elberta Fortis in 6 months.    Jorja Loa PA-C Afib Clinic [facility] [address] [place], [place] [address] [phone] 02/24/2023 2:32 PM

## 2023-03-03 NOTE — Progress Notes (Signed)
Cardiology Office Note:    Date:  03/06/2023   ID:  Christopher Gutierrez, DOB 17-Jun-1961, MRN [id number]  PCP:  Everrett Coombe, DO  Cardiologist:  Little Ishikawa, MD  Electrophysiologist:  Regan Lemming, MD   Referring MD: Everrett Coombe, DO   Chief Complaint  Patient presents with   Coronary Artery Disease    History of Present Illness:    Christopher Gutierrez is a 62 y.o. male with a hx of HTN, tobacco use, alcohol use who presents for follow-up.  He was referred by Dr. Ashley Royalty for evaluation of atrial fibrillation.  At initial clinic visit on 10/27/20, he appeared hypervolemic on exam and was in A. fib with rates up to 160s.  Discussed admission to the hospital for diuresis, rate control of his A. fib and eventual TEE/DCCV, but he declined.  At follow-up visit on 10/30/2020 he appeared more volume overloaded on direct admission was arranged.  Echo showed EF 20 to 25%, reduced RV function.  He had good response to IV diuretics and underwent successful TEE/DCCV on 11/01/2020 with restoration of sinus rhythm.  Initially followed with Dr. Gala Romney in advanced heart failure after his hospitalization.  He was started on Entresto, spironolactone, and bisoprolol.  Repeat echocardiogram 01/26/2021 showed significant improvement in EF to 45 to 50%, and normal RV function.  Coronary CTA was done on 12/04/2021, which showed obstructive CAD in mid RCA.  LHC 12/24/2021 showed severe mid RCA stenosis status post DES.  Echocardiogram 12/2022 showed EF 60 to 65%, grade 1 diastolic dysfunction, normal RV function, no significant valvular disease  Since last clinic visit, he reports he has been under stress recently.  His father died recently.  Denies any chest pain, dyspnea, lower extremity edema, or palpitations.  Reports some lightheadedness but denies any syncope.  He has not been exercising.  Denies any bleeding on Eliquis.  He continues to smoke, up to 0.5 packs/day.  He drinks beer about every  other day, will drink 4-5 beers when he does drink.    Wt Readings from Last 3 Encounters:  03/06/23 207 lb (93.9 kg)  02/24/23 207 lb 3.2 oz (94 kg)  11/27/22 211 lb 8 oz (95.9 kg)   BP Readings from Last 3 Encounters:  03/06/23 116/76  02/24/23 124/72  11/27/22 135/75     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   Hyperlipidemia    Hypertension     Past Surgical History:  Procedure Laterality Date   ABDOMINAL AORTOGRAM W/LOWER EXTREMITY N/A 03/07/2021   Procedure: ABDOMINAL AORTOGRAM W/LOWER EXTREMITY;  Surgeon: Iran Ouch, MD;  Location: [facility] INVASIVE CV LAB;  Service: Cardiovascular;  Laterality: N/A;   ATRIAL FIBRILLATION ABLATION N/A 05/16/2022   Procedure: ATRIAL FIBRILLATION ABLATION;  Surgeon: Regan Lemming, MD;  Location: [facility] INVASIVE CV LAB;  Service: Cardiovascular;  Laterality: N/A;   CARDIAC CATHETERIZATION     CARDIOVERSION N/A 11/01/2020   Procedure: CARDIOVERSION;  Surgeon: Jake Bathe, MD;  Location: [facility] ENDOSCOPY;  Service: Cardiovascular;  Laterality: N/A;   CATARACT EXTRACTION Right    CORONARY STENT INTERVENTION N/A 12/24/2021   Procedure: CORONARY STENT INTERVENTION;  Surgeon: Yvonne Kendall, MD;  Location: [facility] INVASIVE CV LAB;  Service: Cardiovascular;  Laterality: N/A;   LEFT  HEART CATH AND CORONARY ANGIOGRAPHY N/A 12/24/2021   Procedure: LEFT HEART CATH AND CORONARY ANGIOGRAPHY;  Surgeon: Yvonne Kendall, MD;  Location: [facility] INVAS[facility] CV LAB;  Service: Cardiovascular;  Laterality: N/A;   MOUTH SURGERY  2013   PERIPHERAL VASCULAR INTERVENTION Left 03/07/2021   Procedure: PERIPHERAL VASCULAR INTERVENTION;  Surgeon: Iran Ouch, MD;  Location: [facility]VAS[facility] CV LAB;  Service: Cardiovascular;  Laterality: Left;  left SFA   TEE  WITHOUT CARDIOVERSION N/A 11/01/2020   Procedure: TRANSESOPHAGEAL ECHOCARDIOGRAM (TEE);  Surgeon: Jake Bathe, MD;  Location: [facility] ENDOSCOPY;  Service: Cardiovascular;  Laterality: N/A;    Current Medications: Current Meds  Medication Sig   acetaminophen (TYLENOL) 500 MG tablet Take 500 mg by mouth every 6 (six) hours as needed for moderate pain.   apixaban (ELIQUIS) 5 MG TABS tablet TAKE 1 TABLET BY MOUTH TWICE A DAY   aspirin EC 81 MG tablet Take 1 tablet (81 mg total) by mouth daily. Swallow whole.   bisoprolol (ZEBETA) 10 MG tablet TAKE 1 TABLET BY MOUTH EVERY DAY   cetirizine (ZYRTEC) 10 MG tablet Take 10 mg by mouth as needed for allergies.   dapagliflozin propanediol (FARXIGA) 10 MG TABS tablet Take 1 tablet (10 mg total) by mouth daily before breakfast.   ENTRESTO 97-103 MG TAKE 1 TABLET BY MOUTH TWICE A DAY   ezetimibe (ZETIA) 10 MG tablet TAKE 1 TABLET BY MOUTH EVERY DAY   famotidine-calcium carbonate-magnesium hydroxide (PEPCID COMPLETE) 10-800-165 MG chewable tablet Chew 1 tablet by mouth daily as needed (acid reflux).   hydrALAZINE (APRESOLINE) 25 MG tablet TAKE 1 TABLET BY MOUTH IN THE MORNING AND IN THE EVENING   Polyvinyl Alcohol-Povidone (REFRESH OP) Place 1 drop into both eyes daily as needed (dry eyes).   rosuvastatin (CRESTOR) 40 MG tablet Take 1 tablet (40 mg total) by mouth daily.   SIMBRINZA 1-0.2 % SUSP Apply 1 drop to eye 2 (two) times daily.   [DISCONTINUED] spironolactone (ALDACTONE) 25 MG tablet Take 0.5 tablets (12.5 mg total) by mouth daily.     Allergies:   Beta adrenergic blockers and Tape   Social History   Socioeconomic History   Marital status: Divorced    Spouse name: Not on file   Number of children: 1   Years of education: Not on file   Highest education level: Master's degree (e.g., MA, MS, MEng, MEd, MSW, MBA)  Occupational History   Occupation: unemployed  Tobacco Use   Smoking status: Every Day    Current packs/day: 1.50    Average  packs/day: 1.5 packs/day for 25.0 years (37.5 ttl pk-yrs)    Types: Cigarettes   Smokeless tobacco: Former    Types: Chew    Quit date: 08/2022   Tobacco comments:    07/18/22 Smokes 1/2 ppd not ready to quit given smoking cessation information  Vaping Use   Vaping status: Never Used  Substance and Sexual Activity   Alcohol use: Yes    Alcohol/week: 24.0 standard drinks of alcohol    Types: 24 Cans of beer per week    Comment: 6 cans every other day 05/31/22   Drug use: Yes    Frequency: 2.0 times per week    Types: Marijuana    Comment: smokes marijuana ocass   Sexual activity: Yes    Partners: Female  Other Topics Concern   Not on file  Social History Narrative   Not on file   Social Determinants of Health   Financial Resource Strain: Medium  Risk (11/25/2022)   Overall Financial Resource Strain (CARDIA)    Difficulty of Paying Living Expenses: Somewhat hard  Food Insecurity: No Food Insecurity (11/25/2022)   Hunger Vital Sign    Worried About Running Out of Food in the Last Year: Never true    Ran Out of Food in the Last Year: Never true  Transportation Needs: No Transportation Needs (11/25/2022)   PRAPARE - Administrator, Civil Service (Medical): No    Lack of Transportation (Non-Medical): No  Physical Activity: Not on file  Stress: Not on file  Social Connections: Not on file     Family History: The patient's family history includes Birth defects in his paternal aunt; COPD in his maternal uncle; Gout in his brother; Hypertension in his brother; Stroke in his paternal aunt.  ROS:   Please see the history of present illness.     All other systems reviewed and are negative.  EKGs/Labs/Other Studies Reviewed:    The following studies were reviewed today:   EKG:   11/19/22: Normal sinus rhythm, rate 73, PVC, Q waves in V1/2 07/23/2022: Normal sinus rhythm, rate 64, Q waves in V1/2 04/25/2022: Normal sinus rhythm, rate 67, Q waves in V1/2 12/13/2021:  Sinus rhythm, rate 71, PVC, poor R wave progression  Recent Labs: 05/31/2022: B Natriuretic Peptide 621.5 08/01/2022: ALT 39; TSH 5.400 11/20/2022: BUN 15; Creatinine, Ser 1.27; Magnesium 2.3; Potassium 4.7; Sodium 139 11/27/2022: Hemoglobin 17.4; Platelet Count 170  Recent Lipid Panel    Component Value Date/Time   CHOL 143 04/25/2022 1543   TRIG 102 04/25/2022 1543   HDL 65 04/25/2022 1543   CHOLHDL 2.2 04/25/2022 1543   CHOLHDL 3.3 01/26/2021 1213   VLDL 21 01/26/2021 1213   LDLCALC 59 04/25/2022 1543    Physical Exam:    VS:  BP 116/76 (BP Location: Left Arm, Patient Position: Sitting, Cuff Size: Normal)   Pulse 66   Ht 6\' 2"  (1.88 m)   Wt 207 lb (93.9 kg)   SpO2 97%   BMI 26.58 kg/m     Wt Readings from Last 3 Encounters:  03/06/23 207 lb (93.9 kg)  02/24/23 207 lb 3.2 oz (94 kg)  11/27/22 211 lb 8 oz (95.9 kg)     GEN:  in no acute distress HEENT: Normal NECK: No JVD CARDIAC: RRR no murmurs RESPIRATORY:  Clear to auscultation without rales, wheezing or rhonchi  ABDOMEN: Soft, non-tender, non-distended MUSCULOSKELETAL: No edema; No deformity  SKIN: Warm and dry NEUROLOGIC:  Alert and oriented x 3 PSYCHIATRIC:  Normal affect   ASSESSMENT:    1. Chronic combined systolic and diastolic congestive heart failure (HCC)   2. CAD in native artery   3. Essential hypertension   4. Persistent atrial fibrillation (HCC)   5. Tobacco use   6. Alcohol use   7. PAD (peripheral artery disease) (HCC)      PLAN:    CAD: Reported exertional chest pain, coronary CTA was done on 12/04/2021, which showed obstructive CAD in mid RCA  [facility] 12/24/2021 showed severe mid RCA stenosis status post DES. -Continue Eliquis -Completed 1 year of Plavix after his PCI, subsequently switched to aspirin 81 mg daily -Continue statin  -Continue bisoprol  Chronic combined systolic and diastolic heart failure: New diagnosis in the setting of afib w/rvr, alcohol use and heavy smoking history.  10/2020 EF 20%, BIV failure. ECHO 01/26/21 with significant improvement in EF 45-50%, normal RV function.  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  Echocardiogram 12/2022  showed EF 60 to 65%, grade 1 diastolic dysfunction, normal RV function, no significant valvular disease -NYHA Class II symptoms, euvolemic on exam -continue spironolactone 25 mg daily.   -continue entresto 97/103 -continue bisoprolol 10 mg daily -Continue Farxiga 10 mg daily -Continue hydralazine 25 mg twice daily -Check BMET, Mag   Persistent Afib: new diagnosis 10/2020. S/p TEE/DCCV 11/01/20.  Underwent cardioversion in ED for atrial flutter 08/2021.  He was referred to EP and underwent ablation with Dr. Elberta Fortis 05/2022.  He underwent DCCV in ED 05/31/2022 and started on amiodarone. -Amiodarone discontinued 08/2022.  Appears to be maintaining sinus rhythm -CHADS2VASC 3, continue eliquis -continue bisoprolol  -Check sleep study  Tobacco Use: Counseled on the risks of tobacco use and cessation strongly encouraged.   Polycythemia: Likely secondary to smoking. Hematology following. Jak 2 negative   Alcohol Use: Had cut back on alcohol but now drinking again, states that he will drink every other day and drink about 4-5 beers when he does.  Encouraged cessation  Hypertension: Continue Entresto, bisoprolol, hydralazine, and spironolactone as above  PAD: Reported severe left calf claudication with rest pain and ischemic changes in toes.  Angiography 03/2021 showed no significant aortoiliac disease on the left with distal SFA occlusion into the proximal popliteal artery, severe focal stenosis in distal right SFA.  Follows with Dr. Kirke Corin, underwent successful angioplasty and stent placement to left SFA into proximal popliteal artery.  Postprocedure ABIs showed improvement with normal ABIs on left, mild disease (0.81) on right -Continue Eliquis -Continue rosuvastatin -Smoking cessation recommended  Hyperlipidemia: LDL 95 on  11/22/2021, on rosuvastatin 20 mg daily.  Rosuvastatin increased to 40 mg daily.  LDL remain 95 on 01/16/2022, Zetia 10 mg daily added.  LDL 59 on 04/25/22  Daytime somnolence: Itamar sleep study was ordered and approved but has not been worn yet.  Will reach out to sleep coordinator to see if OK to still use   RTC in 4 months   Medication Adjustments/Labs and Tests Ordered: Current medicines are reviewed at length with the patient today.  Concerns regarding medicines are outlined above.  Orders Placed This Encounter  Procedures   Basic metabolic panel   Magnesium   Meds ordered this encounter  Medications   spironolactone (ALDACTONE) 25 MG tablet    Sig: Take 1 tablet (25 mg total) by mouth daily.    Dispense:  90 tablet    Refill:  3    Patient Instructions  Medication Instructions:  Sprironolactone dosage changed to 25mg    *If you need a refill on your cardiac medications before your next appointment, please call your pharmacy*   Lab Work: BMET MAG  If you have labs (blood work) drawn today and your tests are completely normal, you will receive your results only by: MyChart Message (if you have MyChart) OR A paper copy in the mail If you have any lab test that is abnormal or we need to change your treatment, we will call you to review the results.   Testing/Procedures: None needed   Follow-Up: At [facility], you and your health needs are our priority.  As part of our continuing mission to provide you with exceptional heart care, we have created designated Provider Care Teams.  These Care Teams include your primary Cardiologist (physician) and Advanced Practice Providers (APPs -  Physician Assistants and Nurse Practitioners) who all work together to provide you with the care you need, when you need it.  We recommend signing up for the patient  portal called "MyChart".  Sign up information is provided on this After Visit Summary.  MyChart is used to connect with  patients for Virtual Visits (Telemedicine).  Patients are able to view lab/test results, encounter notes, upcoming appointments, etc.  Non-urgent messages can be sent to your provider as well.   To learn more about what you can do with MyChart, go to [email or web address].    Your next appointment:   4 month(s)  Provider:   Little Ishikawa, MD     Signed, Little Ishikawa, MD  03/06/2023 3:29 PM    [facility]

## 2023-03-06 ENCOUNTER — Encounter: Payer: Self-pay | Admitting: Cardiology

## 2023-03-06 ENCOUNTER — Ambulatory Visit: Payer: Commercial Managed Care - PPO | Admitting: Cardiology

## 2023-03-06 ENCOUNTER — Telehealth: Payer: Self-pay | Admitting: *Deleted

## 2023-03-06 VITALS — BP 116/76 | HR 66 | Ht 74.0 in | Wt 207.0 lb

## 2023-03-06 DIAGNOSIS — I4819 Other persistent atrial fibrillation: Secondary | ICD-10-CM | POA: Diagnosis not present

## 2023-03-06 DIAGNOSIS — I10 Essential (primary) hypertension: Secondary | ICD-10-CM | POA: Diagnosis not present

## 2023-03-06 DIAGNOSIS — I2510 Atherosclerotic heart disease of native coronary artery without angina pectoris: Secondary | ICD-10-CM

## 2023-03-06 DIAGNOSIS — I739 Peripheral vascular disease, unspecified: Secondary | ICD-10-CM

## 2023-03-06 DIAGNOSIS — R400 Somnolence: Secondary | ICD-10-CM

## 2023-03-06 DIAGNOSIS — I5042 Chronic combined systolic (congestive) and diastolic (congestive) heart failure: Secondary | ICD-10-CM

## 2023-03-06 DIAGNOSIS — Z789 Other specified health status: Secondary | ICD-10-CM

## 2023-03-06 DIAGNOSIS — Z720 Tobacco use: Secondary | ICD-10-CM

## 2023-03-06 MED ORDER — SPIRONOLACTONE 25 MG PO TABS: 25.0000 mg | ORAL_TABLET | Freq: Every day | ORAL | 3 refills | Status: DC

## 2023-03-06 NOTE — Telephone Encounter (Signed)
Prior Authorization for [facility] sent to [facility] via web portal. Tracking Number . Case ID# [id number]

## 2023-03-06 NOTE — Patient Instructions (Signed)
Medication Instructions:  Sprironolactone dosage changed to 25mg    *If you need a refill on your cardiac medications before your next appointment, please call your pharmacy*   Lab Work: BMET MAG  If you have labs (blood work) drawn today and your tests are completely normal, you will receive your results only by: MyChart Message (if you have MyChart) OR A paper copy in the mail If you have any lab test that is abnormal or we need to change your treatment, we will call you to review the results.   Testing/Procedures: None needed   Follow-Up: At [facility], you and your health needs are our priority.  As part of our continuing mission to provide you with exceptional heart care, we have created designated Provider Care Teams.  These Care Teams include your primary Cardiologist (physician) and Advanced Practice Providers (APPs -  Physician Assistants and Nurse Practitioners) who all work together to provide you with the care you need, when you need it.  We recommend signing up for the patient portal called "MyChart".  Sign up information is provided on this After Visit Summary.  MyChart is used to connect with patients for Virtual Visits (Telemedicine).  Patients are able to view lab/test results, encounter notes, upcoming appointments, etc.  Non-urgent messages can be sent to your provider as well.   To learn more about what you can do with MyChart, go to [email or web address].    Your next appointment:   4 month(s)  Provider:   Little Ishikawa, MD

## 2023-03-28 ENCOUNTER — Telehealth: Payer: Self-pay | Admitting: *Deleted

## 2023-03-28 NOTE — Telephone Encounter (Signed)
Message sent to sleep coordinator  

## 2023-03-28 NOTE — Telephone Encounter (Signed)
Prior Authorization for [employer] sent to [facility] via web portal. Tracking Number . READY- APPROVED-03/06/23 --06/06/23

## 2023-04-03 NOTE — Telephone Encounter (Signed)
Notified patient via VM, per DPR. Provided PIN for Itamar device and left callback number for any questions.

## 2023-05-09 ENCOUNTER — Other Ambulatory Visit: Payer: Self-pay | Admitting: Cardiology

## 2023-05-09 DIAGNOSIS — I4891 Unspecified atrial fibrillation: Secondary | ICD-10-CM

## 2023-05-09 NOTE — Telephone Encounter (Signed)
Prescription refill request for Eliquis received. Indication: Afib  Last office visit: 03/06/23 Christopher Gutierrez)  Scr: 1.01 (03/06/23)  Age: 63 Weight: 93.9kg  Appropriate dose. Refill sent.

## 2023-05-26 ENCOUNTER — Telehealth: Payer: Self-pay | Admitting: Cardiology

## 2023-05-26 ENCOUNTER — Other Ambulatory Visit: Payer: Self-pay | Admitting: Cardiovascular Disease

## 2023-05-26 MED ORDER — ENTRESTO 97-103 MG PO TABS: 1.0000 | ORAL_TABLET | Freq: Two times a day (BID) | ORAL | 9 refills | Status: DC

## 2023-05-26 NOTE — Telephone Encounter (Signed)
*  STAT* If patient is at the pharmacy, call can be transferred to refill team.   1. Which medications need to be refilled? (please list name of each medication and dose if known)   ENTRESTO 97-103 MG   4. Which pharmacy/location (including street and city if local pharmacy) is medication to be sent to? [id number] - [place], [place] - [address]     5. Do they need a 30 day or 90 day supply? 90

## 2023-05-26 NOTE — Telephone Encounter (Signed)
Refill Request.  

## 2023-05-26 NOTE — Telephone Encounter (Signed)
Pt's medication was sent to pt's pharmacy as requested. Confirmation received.  °

## 2023-05-28 IMAGING — CT CT HEART MORP W/ CTA COR W/ SCORE W/ CA W/CM &/OR W/O CM
4 of 7 series · 8 of 20 positions shown, 9 images · IV contrast (APPLIED)
Comparison: None.
COMPARISON: None.

Addendum:
CLINICAL DATA: This is a 60 year old male with chest pain.

EXAM:
Cardiac/Coronary  CTA
TECHNIQUE: The patient was scanned on a [employer] Force scanner.

[Series 6: best diast · axial · 0.39mm/px · z∈[+1440,+1479]mm · 2 of 291 slices shown, 3 images]
[im 97/291  vessel]
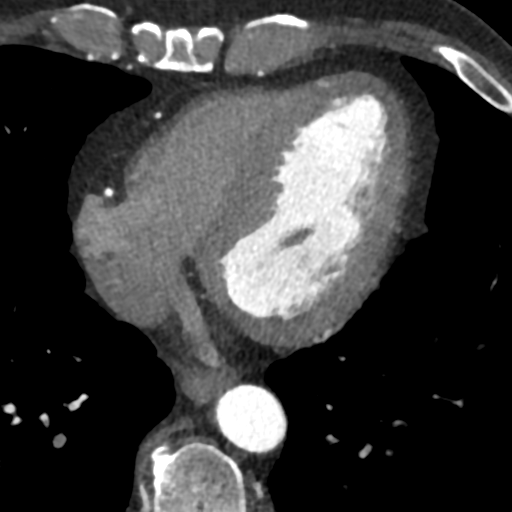
[im 97/291  lung]
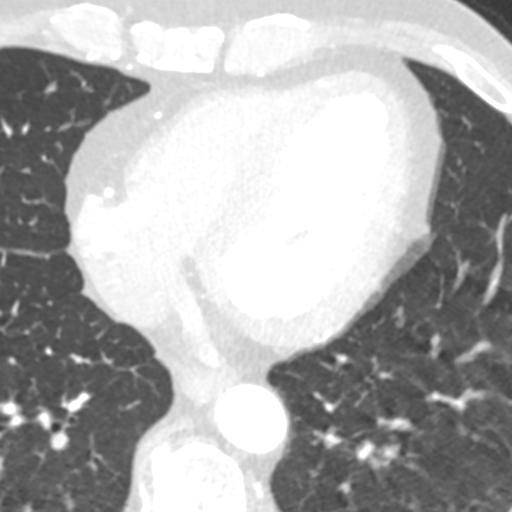
[im 194/291  vessel]
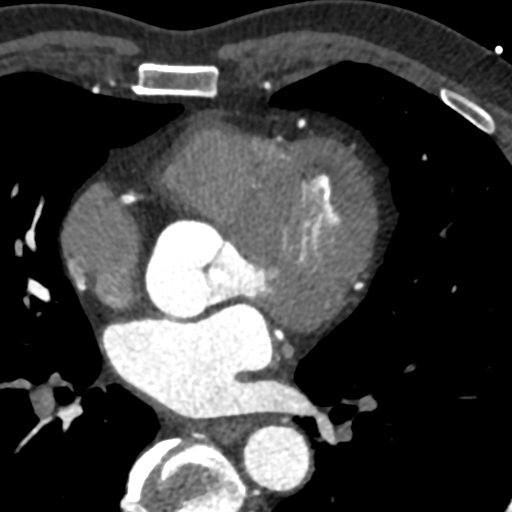

[Series 7: best syst · axial · 0.39mm/px · z∈[+1440,+1479]mm · 2 of 291 slices shown]
[im 97/291  vessel]
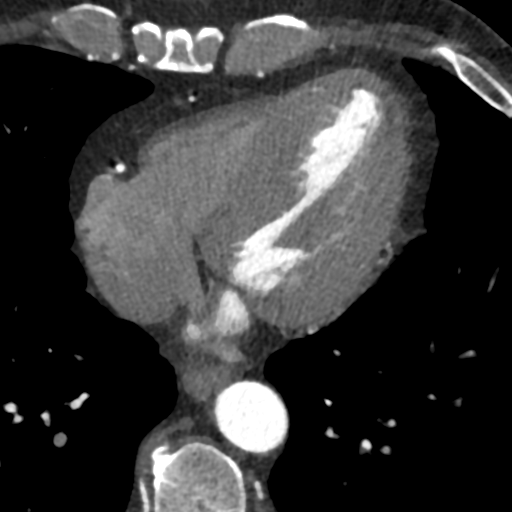
[im 194/291  vessel]
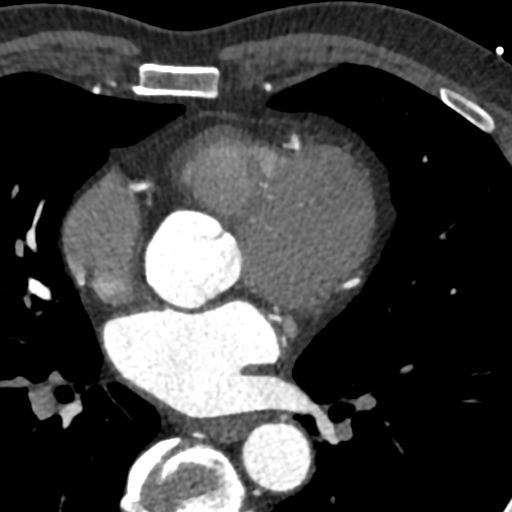

[Series 8: ts diast sharp · axial · 0.39mm/px · z∈[+1440,+1479]mm · 2 of 291 slices shown]
[im 97/291  lung]
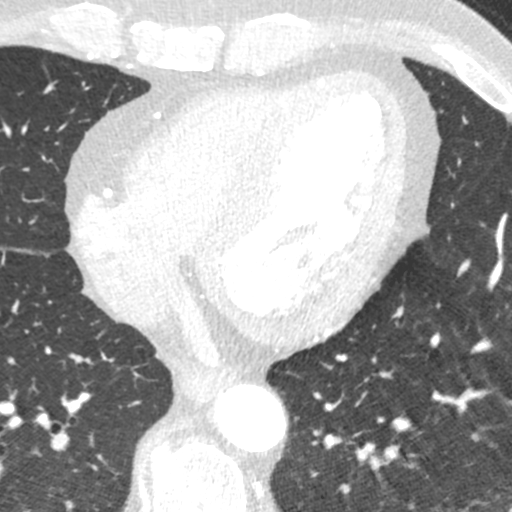
[im 194/291  lung]
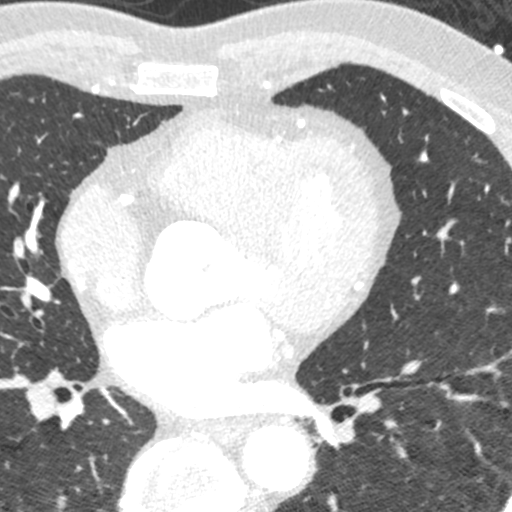

[Series 9: ts syst sharp · axial · 0.39mm/px · z∈[+1440,+1479]mm · 2 of 291 slices shown]
[im 97/291  lung]
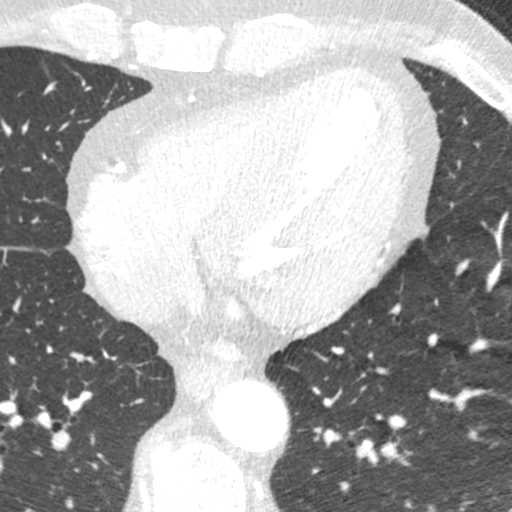
[im 194/291  lung]
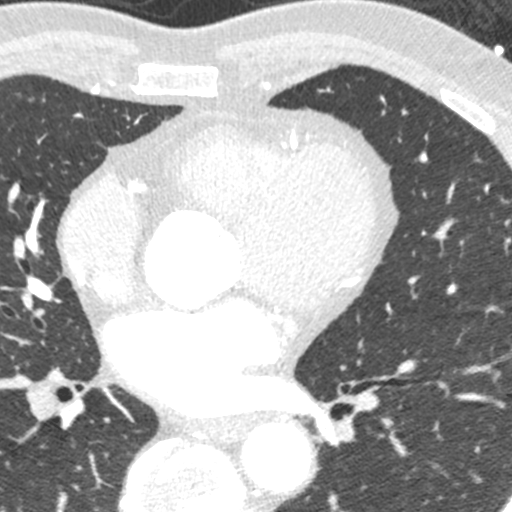

[8 of 20 positions shown; findings below may reference images not displayed]



Aorta: Normal size.  No calcifications.  No dissection.

Aortic Valve:  Trileaflet.  No calcifications.

Coronary Arteries:  Normal coronary origin.  Right dominance.

RCA is a large dominant artery that gives rise to PDA and PLA. There
is mild (25-49%) focal soft plaque in the mid RCA. The proximal and
distal RCA with no plaques.

Left main is a large artery that gives rise to LAD, intermedius
ramus and LCX arteries.

LAD is a large vessel. There is mild calcified plaque in the
proximal LAD. At the proximal-mid LAD there is a focal mild soft
plaque. The mid-distal and distal LAD with no plaques.

Ramus - Mild focal calcification in the proximal portion of the
vessel. The mid and distal portion with no plaques.

LCX is a non-dominant artery that gives rise to one large OM1
branch. There is mild mixed plaque in the proximal LCX. The mid to
distal portion of the vessel with focal mild calcified plaque.

Coronary Calcium Score:

Left main: 0

Left anterior descending artery:

Left circumflex artery:

Right coronary artery:

Total:

Percentile: 67

Other findings:

Normal pulmonary vein drainage into the left atrium.

Normal left atrial appendage without a thrombus.

Normal size of the pulmonary artery.
IMPRESSION: 1. Coronary calcium score of 65.1. This was 67 percentile for age
and sex matched control.

2. Normal coronary origin with right dominance.

3. CAD-RADS 2. Mild non-obstructive CAD (25-49%). Consider
non-atherosclerotic causes of chest pain. Consider preventive
therapy and risk factor modification.

The noncardiac portion of this study will be interpreted in separate
report by the radiologist.

EXAM:
OVER-READ INTERPRETATION  CT CHEST

The following report is an over-read performed by radiologist Dr.
does not include interpretation of cardiac or coronary anatomy or
pathology. The coronary CTA interpretation by the cardiologist is
attached.
FINDINGS: Vascular: No significant extracardiac vascular findings.

Mediastinum/Nodes: No lymphadenopathy

Lungs/Pleura: No focal airspace disease, suspicious pulmonary
nodule, pleural effusion, or pneumothorax within the field of view.

Upper Abdomen: There are multiple cysts in the liver. No acute
findings.

Musculoskeletal: Multiple old right lower rib injuries. No acute
osseous abnormality. No suspicious bone lesion.
IMPRESSION: No acute or significant incidental extracardiac findings in the
chest.

ADDENDUM:
Addendum: After further review of the mid RCA, the lesion appears to
be likely moderate to severe -will send for FFRCt.

*** End of Addendum ***
Addendum:
FINDINGS: A 100 kV prospective scan was triggered in the descending thoracic
aorta at 111 HU's. Axial non-contrast 3 mm slices were carried out
through the heart. The data set was analyzed on a dedicated work
station and scored using the Agatson method. Gantry rotation speed
was 250 msecs and collimation was .6 mm. No beta blockade and 0.8 mg
of sl NTG was given. The 3D data set was reconstructed in 5%
intervals of the 67-82 % of the R-R cycle. Diastolic phases were
analyzed on a dedicated work station using MPR, MIP and VRT modes.
The patient received 80 cc of contrast.

Aorta: Normal size.  No calcifications.  No dissection.

Aortic Valve:  Trileaflet.  No calcifications.

Coronary Arteries:  Normal coronary origin.  Right dominance.

RCA is a large dominant artery that gives rise to PDA and PLA. There
is mild (25-49%) focal soft plaque in the mid RCA. The proximal and
distal RCA with no plaques.

Left main is a large artery that gives rise to LAD, intermedius
ramus and LCX arteries.

LAD is a large vessel. There is mild calcified plaque in the
proximal LAD. At the proximal-mid LAD there is a focal mild soft
plaque. The mid-distal and distal LAD with no plaques.

Ramus - Mild focal calcification in the proximal portion of the
vessel. The mid and distal portion with no plaques.

LCX is a non-dominant artery that gives rise to one large OM1
branch. There is mild mixed plaque in the proximal LCX. The mid to
distal portion of the vessel with focal mild calcified plaque.

Coronary Calcium Score:

Left main: 0

Left anterior descending artery:

Left circumflex artery:

Right coronary artery:

Total:

Percentile: 67

Other findings:

Normal pulmonary vein drainage into the left atrium.

Normal left atrial appendage without a thrombus.

Normal size of the pulmonary artery.
IMPRESSION: 1. Coronary calcium score of 65.1. This was 67 percentile for age
and sex matched control.

2. Normal coronary origin with right dominance.

3. CAD-RADS 2. Mild non-obstructive CAD (25-49%). Consider
non-atherosclerotic causes of chest pain. Consider preventive
therapy and risk factor modification.

The noncardiac portion of this study will be interpreted in separate
report by the radiologist.

EXAM:
OVER-READ INTERPRETATION  CT CHEST

The following report is an over-read performed by radiologist Dr.
does not include interpretation of cardiac or coronary anatomy or
pathology. The coronary CTA interpretation by the cardiologist is
attached.
FINDINGS: Vascular: No significant extracardiac vascular findings.

Mediastinum/Nodes: No lymphadenopathy

Lungs/Pleura: No focal airspace disease, suspicious pulmonary
nodule, pleural effusion, or pneumothorax within the field of view.

Upper Abdomen: There are multiple cysts in the liver. No acute
findings.

Musculoskeletal: Multiple old right lower rib injuries. No acute
osseous abnormality. No suspicious bone lesion.
IMPRESSION: No acute or significant incidental extracardiac findings in the
chest.

*** End of Addendum ***
FINDINGS: A 100 kV prospective scan was triggered in the descending thoracic
aorta at 111 HU's. Axial non-contrast 3 mm slices were carried out
through the heart. The data set was analyzed on a dedicated work
station and scored using the Agatson method. Gantry rotation speed
was 250 msecs and collimation was .6 mm. No beta blockade and 0.8 mg
of sl NTG was given. The 3D data set was reconstructed in 5%
intervals of the 67-82 % of the R-R cycle. Diastolic phases were
analyzed on a dedicated work station using MPR, MIP and VRT modes.
The patient received 80 cc of contrast.

Aorta: Normal size.  No calcifications.  No dissection.

Aortic Valve:  Trileaflet.  No calcifications.

Coronary Arteries:  Normal coronary origin.  Right dominance.

RCA is a large dominant artery that gives rise to PDA and PLA. There
is mild (25-49%) focal soft plaque in the mid RCA. The proximal and
distal RCA with no plaques.

Left main is a large artery that gives rise to LAD, intermedius
ramus and LCX arteries.

LAD is a large vessel. There is mild calcified plaque in the
proximal LAD. At the proximal-mid LAD there is a focal mild soft
plaque. The mid-distal and distal LAD with no plaques.

Ramus - Mild focal calcification in the proximal portion of the
vessel. The mid and distal portion with no plaques.

LCX is a non-dominant artery that gives rise to one large OM1
branch. There is mild mixed plaque in the proximal LCX. The mid to
distal portion of the vessel with focal mild calcified plaque.

Coronary Calcium Score:

Left main: 0

Left anterior descending artery:

Left circumflex artery:

Right coronary artery:

Total:

Percentile: 67

Other findings:

Normal pulmonary vein drainage into the left atrium.

Normal left atrial appendage without a thrombus.

Normal size of the pulmonary artery.
IMPRESSION: 1. Coronary calcium score of 65.1. This was 67 percentile for age
and sex matched control.

2. Normal coronary origin with right dominance.

3. CAD-RADS 2. Mild non-obstructive CAD (25-49%). Consider
non-atherosclerotic causes of chest pain. Consider preventive
therapy and risk factor modification.

The noncardiac portion of this study will be interpreted in separate
report by the radiologist.

## 2023-06-23 ENCOUNTER — Other Ambulatory Visit (HOSPITAL_COMMUNITY): Payer: Self-pay | Admitting: Cardiovascular Disease

## 2023-07-22 ENCOUNTER — Ambulatory Visit: Payer: Medicare Other | Attending: Cardiology | Admitting: Cardiology

## 2023-07-22 ENCOUNTER — Encounter: Payer: Self-pay | Admitting: Cardiology

## 2023-07-22 VITALS — BP 118/70 | HR 75 | Ht 74.0 in | Wt 209.0 lb

## 2023-07-22 DIAGNOSIS — I2510 Atherosclerotic heart disease of native coronary artery without angina pectoris: Secondary | ICD-10-CM | POA: Insufficient documentation

## 2023-07-22 DIAGNOSIS — I5042 Chronic combined systolic (congestive) and diastolic (congestive) heart failure: Secondary | ICD-10-CM | POA: Diagnosis present

## 2023-07-22 DIAGNOSIS — I10 Essential (primary) hypertension: Secondary | ICD-10-CM | POA: Diagnosis present

## 2023-07-22 DIAGNOSIS — E785 Hyperlipidemia, unspecified: Secondary | ICD-10-CM | POA: Diagnosis present

## 2023-07-22 DIAGNOSIS — I739 Peripheral vascular disease, unspecified: Secondary | ICD-10-CM | POA: Insufficient documentation

## 2023-07-22 MED ORDER — ROSUVASTATIN CALCIUM 20 MG PO TABS: 20.0000 mg | ORAL_TABLET | Freq: Every day | ORAL | 3 refills | Status: AC

## 2023-07-22 NOTE — Progress Notes (Signed)
Cardiology Office Note:    Date:  07/22/2023   ID:  Christopher Gutierrez, DOB Jan 08, 1961, MRN [id number]  PCP:  Everrett Coombe, DO  Cardiologist:  Little Ishikawa, MD  Electrophysiologist:  Regan Lemming, MD   Referring MD: Everrett Coombe, DO   Chief Complaint  Patient presents with   Coronary Artery Disease    History of Present Illness:    Christopher Gutierrez is a 62 y.o. male with a hx of HTN, tobacco use, alcohol use who presents for follow-up.  He was referred by Dr. Ashley Royalty for evaluation of atrial fibrillation.  At initial clinic visit on 10/27/20, he appeared hypervolemic on exam and was in A. fib with rates up to 160s.  Discussed admission to the hospital for diuresis, rate control of his A. fib and eventual TEE/DCCV, but he declined.  At follow-up visit on 10/30/2020 he appeared more volume overloaded on direct admission was arranged.  Echo showed EF 20 to 25%, reduced RV function.  He had good response to IV diuretics and underwent successful TEE/DCCV on 11/01/2020 with restoration of sinus rhythm.  Initially followed with Dr. Gala Romney in advanced heart failure after his hospitalization.  He was started on Entresto, spironolactone, and bisoprolol.  Repeat echocardiogram 01/26/2021 showed significant improvement in EF to 45 to 50%, and normal RV function.  Coronary CTA was done on 12/04/2021, which showed obstructive CAD in mid RCA.  LHC 12/24/2021 showed severe mid RCA stenosis status post DES.  Echocardiogram 12/2022 showed EF 60 to 65%, grade 1 diastolic dysfunction, normal RV function, no significant valvular disease  Since last clinic visit, he reports he is doing okay.  Denies any chest pain, dyspnea, lower extremity edema, or palpitations.  Reports occasional lightheadedness with standing, denies any syncope.  He has not been exercising.  He is taking Eliquis, denies any bleeding issues.  Continues to smoke about 0.5 packs/day.  He is drinking 6 beers 2 to 3  days/week.    Wt Readings from Last 3 Encounters:  07/22/23 209 lb (94.8 kg)  03/06/23 207 lb (93.9 kg)  02/24/23 207 lb 3.2 oz (94 kg)   BP Readings from Last 3 Encounters:  07/22/23 118/70  03/06/23 116/76  02/24/23 124/72     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   Hyperlipidemia    Hypertension     Past Surgical History:  Procedure Laterality Date   ABDOMINAL AORTOGRAM W/LOWER EXTREMITY N/A 03/07/2021   Procedure: ABDOMINAL AORTOGRAM W/LOWER EXTREMITY;  Surgeon: Iran Ouch, MD;  Location: [facility];  Service: Cardiovascular;  Laterality: N/A;   ATRIAL FIBRILLATION ABLATION N/A 05/16/2022   Procedure: ATRIAL FIBRILLATION ABLATION;  Surgeon: Regan Lemming, MD;  Location: [facility];  Service: Cardiovascular;  Laterality: N/A;   CARDIAC CATHETERIZATION     CARDIOVERSION N/A 11/01/2020   Procedure: CARDIOVERSION;  Surgeon: Jake Bathe, MD;  Location: [facility];  Service: Cardiovascular;  Laterality: N/A;   CATARACT EXTRACTION Right    CORONARY STENT INTERVENTION N/A 12/24/2021   Procedure: CORONARY STENT INTERVENTION;  Surgeon: Yvonne Kendall, MD;  Location: [facility];  Service: Cardiovascular;  Laterality: N/A;   LEFT HEART CATH AND CORONARY ANGIOGRAPHY N/A 12/24/2021   Procedure: LEFT HEART CATH  AND CORONARY ANGIOGRAPHY;  Surgeon: Yvonne Kendall, MD;  Location: [facility];  Service: Cardiovascular;  Laterality: N/A;   MOUTH SURGERY  2013   PERIPHERAL VASCULAR INTERVENTION Left 03/07/2021   Procedure: PERIPHERAL VASCULAR INTERVENTION;  Surgeon: Iran Ouch, MD;  Location: [facility];  Service: Cardiovascular;  Laterality: Left;  left SFA   TEE WITHOUT CARDIOVERSION N/A 11/01/2020   Procedure:  TRANSESOPHAGEAL ECHOCARDIOGRAM (TEE);  Surgeon: Jake Bathe, MD;  Location: [facility] ENDOSCOPY;  Service: Cardiovascular;  Laterality: N/A;    Current Medications: Current Meds  Medication Sig   acetaminophen (TYLENOL) 500 MG tablet Take 500 mg by mouth every 6 (six) hours as needed for moderate pain.   bisoprolol (ZEBETA) 10 MG tablet TAKE 1 TABLET BY MOUTH EVERY DAY   cetirizine (ZYRTEC) 10 MG tablet Take 10 mg by mouth as needed for allergies.   dapagliflozin propanediol (FARXIGA) 10 MG TABS tablet Take 1 tablet (10 mg total) by mouth daily before breakfast.   ELIQUIS 5 MG TABS tablet TAKE 1 TABLET BY MOUTH TWICE A DAY   ezetimibe (ZETIA) 10 MG tablet TAKE 1 TABLET BY MOUTH EVERY DAY   famotidine-calcium carbonate-magnesium hydroxide (PEPCID COMPLETE) 10-800-165 MG chewable tablet Chew 1 tablet by mouth daily as needed (acid reflux).   hydrALAZINE (APRESOLINE) 25 MG tablet TAKE 1 TABLET BY MOUTH IN THE MORNING AND IN THE EVENING   Polyvinyl Alcohol-Povidone (REFRESH OP) Place 1 drop into both eyes daily as needed (dry eyes).   rosuvastatin (CRESTOR) 20 MG tablet Take 1 tablet (20 mg total) by mouth daily.   sacubitril-valsartan (ENTRESTO) 97-103 MG Take 1 tablet by mouth 2 (two) times daily.   spironolactone (ALDACTONE) 25 MG tablet Take 1 tablet (25 mg total) by mouth daily.   [DISCONTINUED] aspirin EC 81 MG tablet Take 1 tablet (81 mg total) by mouth daily. Swallow whole.   [DISCONTINUED] rosuvastatin (CRESTOR) 40 MG tablet Take 1 tablet (40 mg total) by mouth daily.     Allergies:   Beta adrenergic blockers and Tape   Social History   Socioeconomic History   Marital status: Divorced    Spouse name: Not on file   Number of children: 1   Years of education: Not on file   Highest education level: Master's degree (e.g., MA, MS, MEng, MEd, MSW, MBA)  Occupational History   Occupation: unemployed  Tobacco Use   Smoking status: Every Day    Current packs/day: 1.50    Average  packs/day: 1.5 packs/day for 25.0 years (37.5 ttl pk-yrs)    Types: Cigarettes   Smokeless tobacco: Former    Types: Chew    Quit date: 08/2022   Tobacco comments:    07/18/22 Smokes 1/2 ppd not ready to quit given smoking cessation information  Vaping Use   Vaping status: Never Used  Substance and Sexual Activity   Alcohol use: Yes    Alcohol/week: 24.0 standard drinks of alcohol    Types: 24 Cans of beer per week    Comment: 6 cans every other day 05/31/22   Drug use: Yes    Frequency: 2.0 times per week    Types: Marijuana    Comment: smokes marijuana ocass   Sexual activity: Yes    Partners: Female  Other Topics Concern   Not on file  Social History Narrative   Not on file   Social Drivers of Health   Financial Resource Strain: Medium Risk (11/25/2022)   Overall Financial Resource Strain (CARDIA)  Difficulty of Paying Living Expenses: Somewhat hard  Food Insecurity: No Food Insecurity (11/25/2022)   Hunger Vital Sign    Worried About Running Out of Food in the Last Year: Never true    Ran Out of Food in the Last Year: Never true  Transportation Needs: No Transportation Needs (11/25/2022)   PRAPARE - Administrator, Civil Service (Medical): No    Lack of Transportation (Non-Medical): No  Physical Activity: Not on file  Stress: Not on file  Social Connections: Not on file     Family History: The patient's family history includes Birth defects in his paternal aunt; COPD in his maternal uncle; Gout in his brother; Hypertension in his brother; Stroke in his paternal aunt.  ROS:   Please see the history of present illness.     All other systems reviewed and are negative.  EKGs/Labs/Other Studies Reviewed:    The following studies were reviewed today:   EKG:   11/19/22: Normal sinus rhythm, rate 73, PVC, Q waves in V1/2 07/23/2022: Normal sinus rhythm, rate 64, Q waves in V1/2 04/25/2022: Normal sinus rhythm, rate 67, Q waves in V1/2 12/13/2021: Sinus  rhythm, rate 71, PVC, poor R wave progression  Recent Labs: 08/01/2022: ALT 39; TSH 5.400 11/27/2022: Hemoglobin 17.4; Platelet Count 170 03/06/2023: BUN 14; Creatinine, Ser 1.01; Magnesium 2.3; Potassium 4.5; Sodium 140  Recent Lipid Panel    Component Value Date/Time   CHOL 143 04/25/2022 1543   TRIG 102 04/25/2022 1543   HDL 65 04/25/2022 1543   CHOLHDL 2.2 04/25/2022 1543   CHOLHDL 3.3 01/26/2021 1213   VLDL 21 01/26/2021 1213   LDLCALC 59 04/25/2022 1543    Physical Exam:    VS:  BP 118/70   Pulse 75   Ht 6\' 2"  (1.88 m)   Wt 209 lb (94.8 kg)   SpO2 95%   BMI 26.83 kg/m     Wt Readings from Last 3 Encounters:  07/22/23 209 lb (94.8 kg)  03/06/23 207 lb (93.9 kg)  02/24/23 207 lb 3.2 oz (94 kg)     GEN:  in no acute distress HEENT: Normal NECK: No JVD CARDIAC: RRR no murmurs RESPIRATORY:  Clear to auscultation without rales, wheezing or rhonchi  ABDOMEN: Soft, non-tender, non-distended MUSCULOSKELETAL: No edema; No deformity  SKIN: Warm and dry NEUROLOGIC:  Alert and oriented x 3 PSYCHIATRIC:  Normal affect   ASSESSMENT:    1. CAD in native artery   2. Chronic combined systolic and diastolic congestive heart failure (HCC)   3. Essential hypertension   4. PAD (peripheral artery disease) (HCC)   5. Hyperlipidemia, unspecified hyperlipidemia type       PLAN:    CAD: Reported exertional chest pain, coronary CTA was done on 12/04/2021, which showed obstructive CAD in mid RCA  [facility] 12/24/2021 showed severe mid RCA stenosis status post DES. -Continue Eliquis -Completed 1 year of Plavix after his PCI, subsequently switched to aspirin 81 mg daily -Continue statin  -Continue bisoprol  Chronic combined systolic and diastolic heart failure: New diagnosis in the setting of afib w/rvr, alcohol use and heavy smoking history. 10/2020 EF 20%, BIV failure. ECHO 01/26/21 with significant improvement in EF 45-50%, normal RV function.  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   Echocardiogram 12/2022 showed EF 60 to 65%, grade 1 diastolic dysfunction, normal RV function, no significant valvular disease -NYHA Class II symptoms, euvolemic on exam -continue spironolactone 25 mg daily.   -continue entresto 97/103 -continue bisoprolol 10 mg daily -  Continue Farxiga 10 mg daily -Continue hydralazine 25 mg twice daily -Check BMET, Mag   Persistent Afib: new diagnosis 10/2020. S/p TEE/DCCV 11/01/20.  Underwent cardioversion in ED for atrial flutter 08/2021.  He was referred to EP and underwent ablation with Dr. Elberta Fortis 05/2022.  He underwent DCCV in ED 05/31/2022 and started on amiodarone. -Amiodarone discontinued 08/2022.  Appears to be maintaining sinus rhythm -CHADS2VASC 3, continue eliquis -continue bisoprolol  -Recommended sleep study but patient declines at this time  Tobacco Use: Counseled on the risks of tobacco use and cessation strongly encouraged.   Polycythemia: Likely secondary to smoking. Hematology following. Jak 2 negative   Alcohol Use: Had cut back on alcohol but now drinking again, states that he will drink 2 to 3 days/week and will drink 6 beers when he drinks.  Encouraged cessation  Hypertension: Continue Entresto, bisoprolol, hydralazine, and spironolactone as above  PAD: Reported severe left calf claudication with rest pain and ischemic changes in toes.  Angiography 03/2021 showed no significant aortoiliac disease on the left with distal SFA occlusion into the proximal popliteal artery, severe focal stenosis in distal right SFA.  Follows with Dr. Kirke Corin, underwent successful angioplasty and stent placement to left SFA into proximal popliteal artery.  Postprocedure ABIs showed improvement with normal ABIs on left, mild disease (0.81) on right -Continue Eliquis -Continue rosuvastatin -Smoking cessation recommended  Hyperlipidemia: LDL 95 on 11/22/2021, on rosuvastatin 20 mg daily.  Rosuvastatin increased to 40 mg daily but he reports he is only taking 20 mg  daily.  LDL remain 95 on 01/16/2022, Zetia 10 mg daily added.  LDL 59 on 04/25/22.  Check lipid panel  Daytime somnolence: Itamar sleep study was ordered and approved but he never wore device.  Declines sleep study at this time   RTC in 6 months   Medication Adjustments/Labs and Tests Ordered: Current medicines are reviewed at length with the patient today.  Concerns regarding medicines are outlined above.  Orders Placed This Encounter  Procedures   Lipid panel   Basic Metabolic Panel (BMET)   Magnesium   Meds ordered this encounter  Medications   rosuvastatin (CRESTOR) 20 MG tablet    Sig: Take 1 tablet (20 mg total) by mouth daily.    Dispense:  90 tablet    Refill:  3    Patient Instructions  Medication Instructions:  Stop Aspirin 81 mg daily Start taking Crestor 20mg  daily *If you need a refill on your cardiac medications before your next appointment, please call your pharmacy*   Lab Work: Bmet, Mg, Lipid today If you have labs (blood work) drawn today and your tests are completely normal, you will receive your results only by: MyChart Message (if you have MyChart) OR A paper copy in the mail If you have any lab test that is abnormal or we need to change your treatment, we will call you to review the results.   Testing/Procedures: none   Follow-Up: At [facility], you and your health needs are our priority.  As part of our continuing mission to provide you with exceptional heart care, we have created designated Provider Care Teams.  These Care Teams include your primary Cardiologist (physician) and Advanced Practice Providers (APPs -  Physician Assistants and Nurse Practitioners) who all work together to provide you with the care you need, when you need it.  We recommend signing up for the patient portal called "MyChart".  Sign up information is provided on this After Visit Summary.  MyChart is  used to connect with patients for Virtual Visits (Telemedicine).   Patients are able to view lab/test results, encounter notes, upcoming appointments, etc.  Non-urgent messages can be sent to your provider as well.   To learn more about what you can do with MyChart, go to [email or web address].    Your next appointment:   6 month(s)  Provider:   Little Ishikawa, MD     Other Instructions none        Signed, Little Ishikawa, MD  07/22/2023 5:32 PM    [facility]

## 2023-07-22 NOTE — Patient Instructions (Signed)
Medication Instructions:  Stop Aspirin 81 mg daily Start taking Crestor 20mg  daily *If you need a refill on your cardiac medications before your next appointment, please call your pharmacy*   Lab Work: Bmet, Mg, Lipid today If you have labs (blood work) drawn today and your tests are completely normal, you will receive your results only by: MyChart Message (if you have MyChart) OR A paper copy in the mail If you have any lab test that is abnormal or we need to change your treatment, we will call you to review the results.   Testing/Procedures: none   Follow-Up: At [facility], you and your health needs are our priority.  As part of our continuing mission to provide you with exceptional heart care, we have created designated Provider Care Teams.  These Care Teams include your primary Cardiologist (physician) and Advanced Practice Providers (APPs -  Physician Assistants and Nurse Practitioners) who all work together to provide you with the care you need, when you need it.  We recommend signing up for the patient portal called "MyChart".  Sign up information is provided on this After Visit Summary.  MyChart is used to connect with patients for Virtual Visits (Telemedicine).  Patients are able to view lab/test results, encounter notes, upcoming appointments, etc.  Non-urgent messages can be sent to your provider as well.   To learn more about what you can do with MyChart, go to [email or web address].    Your next appointment:   6 month(s)  Provider:   Little Ishikawa, MD     Other Instructions none

## 2023-07-23 LAB — LIPID PANEL
Chol/HDL Ratio: 2.4 {ratio} (ref 0.0–5.0)
Cholesterol, Total: 142 mg/dL (ref 100–199)
HDL: 59 mg/dL (ref >39)
LDL Chol Calc (NIH): 57 mg/dL (ref 0–99)
Triglycerides: 157 mg/dL — ABNORMAL HIGH (ref 0–149)
VLDL Cholesterol Cal: 26 mg/dL (ref 5–40)

## 2023-07-23 LAB — BASIC METABOLIC PANEL
BUN/Creatinine Ratio: 14 (ref 10–24)
BUN: 17 mg/dL (ref 8–27)
CO2: 23 mmol/L (ref 20–29)
Calcium: 9.7 mg/dL (ref 8.6–10.2)
Chloride: 107 mmol/L — ABNORMAL HIGH (ref 96–106)
Creatinine, Ser: 1.21 mg/dL (ref 0.76–1.27)
Glucose: 103 mg/dL — ABNORMAL HIGH (ref 70–99)
Potassium: 5.4 mmol/L — ABNORMAL HIGH (ref 3.5–5.2)
Sodium: 143 mmol/L (ref 134–144)
eGFR: 69 mL/min/{1.73_m2} (ref >59)

## 2023-07-23 LAB — MAGNESIUM: Magnesium: 2.6 mg/dL — ABNORMAL HIGH (ref 1.6–2.3)

## 2023-07-24 ENCOUNTER — Telehealth: Payer: Self-pay | Admitting: Emergency Medicine

## 2023-07-24 DIAGNOSIS — E875 Hyperkalemia: Secondary | ICD-10-CM

## 2023-07-24 MED ORDER — SPIRONOLACTONE 25 MG PO TABS: 12.5000 mg | ORAL_TABLET | Freq: Every day | ORAL | 3 refills | Status: DC

## 2023-07-24 NOTE — Telephone Encounter (Signed)
Left message with the information below and call back number  Per Dr Bjorn Pippin Cholesterol looks good.  Stable kidney function but potassium mildly elevated.  Would reduce spironolactone to 12.5 mg daily, repeat BMET in 1 week.   Please return for Blood Work 08/01/23. No appointment needed, lab here at the office is open Monday-Friday from 8AM to 4PM and closed daily for lunch from 1-2pm.   Thank you Florentina Addison, RN  Order for BMET placed; medication list updated

## 2023-08-01 ENCOUNTER — Ambulatory Visit: Payer: Medicare Other | Attending: Cardiology | Admitting: Cardiology

## 2023-08-01 ENCOUNTER — Encounter: Payer: Self-pay | Admitting: Cardiology

## 2023-08-01 VITALS — BP 140/74 | HR 63 | Ht 74.0 in | Wt 210.0 lb

## 2023-08-01 DIAGNOSIS — I739 Peripheral vascular disease, unspecified: Secondary | ICD-10-CM | POA: Diagnosis present

## 2023-08-01 DIAGNOSIS — D6869 Other thrombophilia: Secondary | ICD-10-CM | POA: Insufficient documentation

## 2023-08-01 DIAGNOSIS — I4819 Other persistent atrial fibrillation: Secondary | ICD-10-CM | POA: Diagnosis present

## 2023-08-01 DIAGNOSIS — I5022 Chronic systolic (congestive) heart failure: Secondary | ICD-10-CM | POA: Insufficient documentation

## 2023-08-01 DIAGNOSIS — I484 Atypical atrial flutter: Secondary | ICD-10-CM | POA: Insufficient documentation

## 2023-08-01 DIAGNOSIS — I2510 Atherosclerotic heart disease of native coronary artery without angina pectoris: Secondary | ICD-10-CM | POA: Insufficient documentation

## 2023-08-01 NOTE — Patient Instructions (Signed)
Medication Instructions:  Your physician recommends that you continue on your current medications as directed. Please refer to the Current Medication list given to you today.  *If you need a refill on your cardiac medications before your next appointment, please call your pharmacy*   Lab Work: None ordered   Testing/Procedures: None ordered   Follow-Up: At [facility], you and your health needs are our priority.  As part of our continuing mission to provide you with exceptional heart care, we have created designated Provider Care Teams.  These Care Teams include your primary Cardiologist (physician) and Advanced Practice Providers (APPs -  Physician Assistants and Nurse Practitioners) who all work together to provide you with the care you need, when you need it.  Your next appointment:   6 month(s)  The format for your next appointment:   In Person  Provider:   You will follow up in the Atrial Fibrillation Clinic located at [facility]. Your provider will be: Clint R. Fenton, PA-C or Lake Bells, PA-C    Thank you for choosing [facility]!!   Dory Horn, RN [phone]

## 2023-08-01 NOTE — Progress Notes (Signed)
  Electrophysiology Office Note:   Date:  08/01/2023  ID:  Christopher Gutierrez, DOB 10-Jul-1961, MRN [id number]  Primary Cardiologist: Little Ishikawa, MD Primary Heart Failure: None Electrophysiologist: Waynesha Rammel Jorja Loa, MD      History of Present Illness:   Christopher Gutierrez is a 62 y.o. male with h/o hypertension, tobacco use, alcohol abuse, atrial fibrillation seen today for routine electrophysiology followup.   Since last being seen in our clinic the patient reports doing overall well.  He has no chest pain or shortness of breath.  He is able to all of his daily activities.  He has noted no further episodes of atrial fibrillation.  he denies chest pain, palpitations, dyspnea, PND, orthopnea, nausea, vomiting, dizziness, syncope, edema, weight gain, or early satiety.   Review of systems complete and found to be negative unless listed in HPI.   EP Information / Studies Reviewed:    EKG is ordered today. Personal review as below.  EKG Interpretation Date/Time:  Friday August 01 2023 15:08:00 EST Ventricular Rate:  63 PR Interval:  166 QRS Duration:  84 QT Interval:  402 QTC Calculation: 411 R Axis:   68  Text Interpretation: Sinus rhythm with occasional Premature ventricular complexes Low voltage QRS Septal infarct , age undetermined When compared with ECG of 24-Feb-2023 14:09, Premature ventricular complexes are now Present Confirmed by Clois Treanor ([id number]) on 08/01/2023 3:11:40 PM     Risk Assessment/Calculations:    CHA2DS2-VASc Score = 3   This indicates a 3.2% annual risk of stroke. The patient's score is based upon: CHF History: 1 HTN History: 1 Diabetes History: 0 Stroke History: 0 Vascular Disease History: 1 Age Score: 0 Gender Score: 0            Physical Exam:   VS:  BP (!) 140/74 (BP Location: Right Arm, Patient Position: Sitting, Cuff Size: Large)   Pulse 63   Ht 6\' 2"  (1.88 m)   Wt 210 lb (95.3 kg)   SpO2 97%   BMI 26.96 kg/m     Wt Readings from Last 3 Encounters:  08/01/23 210 lb (95.3 kg)  07/22/23 209 lb (94.8 kg)  03/06/23 207 lb (93.9 kg)     GEN: Well nourished, well developed in no acute distress NECK: No JVD; No carotid bruits CARDIAC: Regular rate and rhythm, no murmurs, rubs, gallops RESPIRATORY:  Clear to auscultation without rales, wheezing or rhonchi  ABDOMEN: Soft, non-tender, non-distended EXTREMITIES:  No edema; No deformity   ASSESSMENT AND PLAN:    1.  Persistent atrial fibrillation/flutter: Post ablation 05/16/2022.  Remains in sinus rhythm.  He is happy with his control.  He has had minimal atrial fibrillation since his ablation.  Christopher Gutierrez have him follow-up in A-fib clinic in 6 months.  If he continues to do well, yearly follow-up would be reasonable.  2.  Coronary artery disease: Post RCA stent.  No current chest pain.  3.  Chronic systolic and diastolic heart failure: Currently on optimal medical therapy per primary cardiology  4.  PAD: Distal SFA occlusion.  Follows with vascular surgery.  5.  Secondary hypercoagulable state: Currently on Eliquis for atrial fibrillation  Follow up with Afib Clinic in 6 months  Signed, Eyonna Sandstrom Jorja Loa, MD

## 2023-08-29 ENCOUNTER — Encounter: Payer: Self-pay | Admitting: *Deleted

## 2023-08-29 NOTE — Telephone Encounter (Signed)
Called and left detailed  message on patient's personal cell phone to call office to clarify if he still wishes to proceed with home sleep study. Left message that prior authorization would needed to be submit.

## 2023-09-01 NOTE — Telephone Encounter (Signed)
Called and spoke to patient and identified. Patient verbalizes he does not want to proceed with the sleep study. Patient verbalized he would like to see provided at next scheduled visit and if the study is need he will consider. Patient made aware to call office for any other questions

## 2023-09-29 ENCOUNTER — Other Ambulatory Visit: Payer: Self-pay | Admitting: Cardiology

## 2023-09-29 DIAGNOSIS — I4891 Unspecified atrial fibrillation: Secondary | ICD-10-CM

## 2023-09-29 NOTE — Telephone Encounter (Signed)
 Prescription refill request for Eliquis received. Indication: AF Last office visit: 08/01/23  Carleene Mains MD Scr: 1.21 on 07/22/23  Epic Age: 63 Weight: 95.3kg  Based on above findings Eliquis 5mg  twice daily is the appropriate dose.  Refill approved.

## 2023-10-13 ENCOUNTER — Other Ambulatory Visit: Payer: Self-pay | Admitting: *Deleted

## 2023-10-13 DIAGNOSIS — I739 Peripheral vascular disease, unspecified: Secondary | ICD-10-CM

## 2023-10-21 NOTE — Telephone Encounter (Signed)
**Note De-Identified Idara [facility]side Obfuscation** 3rd Attempt: I called the pt to discuss the WatchPAT One-HST that Dr Bjorn Pippin ordered at his 03/06/2023 office visit but I got no answer so I left a message on his VM (Ok per [employer]) advising him that his insurance does not require a PA for this home sleep test and that he can proceed with the test now. I did provide the Pin # of "[id number]".  The pt stated in a earlier phone call with Dr Campbell Lerner nurse (in this thread) that he was no longer interested in doing a home sleep study.  I advised also in the message that if he has decided not to do the home sleep study to please return the WatchPAT One-Home Sleep Study device in its original unopened box to Dr Campbell Lerner office or he will be billed $100 for it.  I left my name and office phone number in the VM.

## 2023-11-06 ENCOUNTER — Ambulatory Visit (HOSPITAL_BASED_OUTPATIENT_CLINIC_OR_DEPARTMENT_OTHER)
Admission: RE | Admit: 2023-11-06 | Discharge: 2023-11-06 | Disposition: A | Discharge status: Home / Self Care | Source: Ambulatory Visit | Attending: Cardiology | Admitting: Cardiology

## 2023-11-06 ENCOUNTER — Ambulatory Visit (HOSPITAL_COMMUNITY)
Admission: RE | Admit: 2023-11-06 | Discharge: 2023-11-06 | Disposition: A | Discharge status: Home / Self Care | Source: Ambulatory Visit | Attending: Cardiology | Admitting: Cardiology

## 2023-11-06 DIAGNOSIS — I739 Peripheral vascular disease, unspecified: Secondary | ICD-10-CM

## 2023-11-06 LAB — VAS US ABI WITH/WO TBI
Left ABI: 0.72
Right ABI: 0.75

## 2023-11-27 ENCOUNTER — Inpatient Hospital Stay: Payer: Commercial Managed Care - PPO

## 2023-11-27 ENCOUNTER — Inpatient Hospital Stay: Payer: Commercial Managed Care - PPO | Attending: Oncology | Admitting: Oncology

## 2023-11-27 ENCOUNTER — Inpatient Hospital Stay: Payer: Self-pay

## 2023-11-27 VITALS — BP 110/85 | HR 63 | Temp 97.9°F | Resp 18 | Ht 74.0 in | Wt 215.1 lb

## 2023-11-27 DIAGNOSIS — I509 Heart failure, unspecified: Secondary | ICD-10-CM | POA: Insufficient documentation

## 2023-11-27 DIAGNOSIS — I4891 Unspecified atrial fibrillation: Secondary | ICD-10-CM | POA: Insufficient documentation

## 2023-11-27 DIAGNOSIS — I110 Hypertensive heart disease with heart failure: Secondary | ICD-10-CM | POA: Diagnosis not present

## 2023-11-27 DIAGNOSIS — F17210 Nicotine dependence, cigarettes, uncomplicated: Secondary | ICD-10-CM | POA: Insufficient documentation

## 2023-11-27 DIAGNOSIS — I739 Peripheral vascular disease, unspecified: Secondary | ICD-10-CM | POA: Diagnosis not present

## 2023-11-27 DIAGNOSIS — D751 Secondary polycythemia: Secondary | ICD-10-CM | POA: Insufficient documentation

## 2023-11-27 LAB — CBC WITH DIFFERENTIAL (CANCER CENTER ONLY)
Abs Immature Granulocytes: 0.08 10*3/uL — ABNORMAL HIGH (ref 0.00–0.07)
Basophils Absolute: 0.1 10*3/uL (ref 0.0–0.1)
Basophils Relative: 1 %
Eosinophils Absolute: 0.1 10*3/uL (ref 0.0–0.5)
Eosinophils Relative: 1 %
HCT: 54.2 % — ABNORMAL HIGH (ref 39.0–52.0)
Hemoglobin: 18.4 g/dL — ABNORMAL HIGH (ref 13.0–17.0)
Immature Granulocytes: 1 %
Lymphocytes Relative: 19 %
Lymphs Abs: 1.8 10*3/uL (ref 0.7–4.0)
MCH: 33.8 pg (ref 26.0–34.0)
MCHC: 33.9 g/dL (ref 30.0–36.0)
MCV: 99.6 fL (ref 80.0–100.0)
Monocytes Absolute: 0.9 10*3/uL (ref 0.1–1.0)
Monocytes Relative: 10 %
Neutro Abs: 6.7 10*3/uL (ref 1.7–7.7)
Neutrophils Relative %: 68 %
Platelet Count: 141 10*3/uL — ABNORMAL LOW (ref 150–400)
RBC: 5.44 MIL/uL (ref 4.22–5.81)
RDW: 13.2 % (ref 11.5–15.5)
WBC Count: 9.6 10*3/uL (ref 4.0–10.5)
nRBC: 0 % (ref 0.0–0.2)

## 2023-11-27 NOTE — Progress Notes (Signed)
  [facility] OFFICE PROGRESS NOTE   Diagnosis: Erythrocytosis  INTERVAL HISTORY:   Mr. Graham returns as scheduled.  He continues to smoke approximately 1 pack of cigarettes per day.  He drinks a sixpack of beer several days per week.  No symptom of thrombosis.  He continues apixaban  anticoagulation.  No symptom of thrombosis.  Objective:  Vital signs in last 24 hours:  Blood pressure 110/85, pulse 63, temperature 97.9 F (36.6 C), temperature source Temporal, resp. rate 18, height 6\' 2"  (1.88 m), weight 215 lb 1.6 oz (97.6 kg), SpO2 100%.    Resp: Lungs clear bilaterally Cardio: Regular rate and rhythm GI: No hepatosplenomegaly Vascular: No leg edema   Lab Results:  Lab Results  Component Value Date   WBC 9.6 11/27/2023   HGB 18.4 (H) 11/27/2023   HCT 54.2 (H) 11/27/2023   MCV 99.6 11/27/2023   PLT 141 (L) 11/27/2023   NEUTROABS 6.7 11/27/2023    CMP  Lab Results  Component Value Date   NA 143 07/22/2023   K 5.4 (H) 07/22/2023   CL 107 (H) 07/22/2023   CO2 23 07/22/2023   GLUCOSE 103 (H) 07/22/2023   BUN 17 07/22/2023   CREATININE 1.21 07/22/2023   CALCIUM  9.7 07/22/2023   PROT 6.5 08/01/2022   ALBUMIN 4.6 08/01/2022   AST 25 08/01/2022   ALT 39 08/01/2022   ALKPHOS 78 08/01/2022   BILITOT 1.1 08/01/2022   GFRNONAA 53 (L) 05/31/2022    No results found for: "CEA1", "CEA", "WGN562", "CA125"  Lab Results  Component Value Date   INR 1.1 05/31/2022   LABPROT 14.0 05/31/2022    Imaging:  No results found.  Medications: I have reviewed the patient's current medications.   Assessment/Plan: Erythrocytosis, likely secondary to cardiovascular disease, ongoing tobacco use and potentially Aldactone  11/30/2020 JAK2 V617F testing negative   Hypertension Atrial fibrillation CHF Alcohol and tobacco use Peripheral vascular disease, status post left SFA-popliteal stents August 2022     Disposition: Mr. Risby appears stable.  He has  persistent erythrocytosis.  The erythrocytosis is likely related to ongoing tobacco use.  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  There is no indication for phlebotomy therapy.  He would like to continue follow-up in the hematology clinic.  He will return for an office visit and CBC in 1 year.  Coni Deep, MD  11/27/2023  1:18 PM

## 2023-12-01 ENCOUNTER — Other Ambulatory Visit: Payer: Self-pay | Admitting: Cardiology

## 2023-12-02 ENCOUNTER — Ambulatory Visit: Admitting: Cardiovascular Disease

## 2023-12-09 NOTE — Telephone Encounter (Signed)
 4th attempt:  LMOVM (DPR) to request that pt turn in [facility] device. Reminded pt that per patient agreement signed in 04/2022, unopened device needs to be returned to avoid a $100 charge. Requested that pt call back with any questions or concerns. Direct number provided.

## 2023-12-16 ENCOUNTER — Ambulatory Visit: Admitting: Cardiovascular Disease

## 2023-12-24 ENCOUNTER — Other Ambulatory Visit: Payer: Self-pay | Admitting: Cardiology

## 2024-01-13 ENCOUNTER — Ambulatory Visit: Attending: Cardiovascular Disease | Admitting: Cardiovascular Disease

## 2024-01-13 VITALS — BP 120/68 | HR 62 | Ht 74.0 in | Wt 215.0 lb

## 2024-01-13 DIAGNOSIS — E785 Hyperlipidemia, unspecified: Secondary | ICD-10-CM | POA: Diagnosis present

## 2024-01-13 DIAGNOSIS — I739 Peripheral vascular disease, unspecified: Secondary | ICD-10-CM

## 2024-01-13 DIAGNOSIS — Z720 Tobacco use: Secondary | ICD-10-CM

## 2024-01-13 DIAGNOSIS — I5022 Chronic systolic (congestive) heart failure: Secondary | ICD-10-CM

## 2024-01-13 DIAGNOSIS — I4819 Other persistent atrial fibrillation: Secondary | ICD-10-CM | POA: Diagnosis not present

## 2024-01-13 NOTE — Patient Instructions (Signed)
 Medication Instructions:  No changes *If you need a refill on your cardiac medications before your next appointment, please call your pharmacy*  Lab Work: None ordered If you have labs (blood work) drawn today and your tests are completely normal, you will receive your results only by: MyChart Message (if you have MyChart) OR A paper copy in the mail If you have any lab test that is abnormal or we need to change your treatment, we will call you to review the results.  Testing/Procedures: None ordered  Follow-Up: At [facility], you and your health needs are our priority.  As part of our continuing mission to provide you with exceptional heart care, our providers are all part of one team.  This team includes your primary Cardiologist (physician) and Advanced Practice Providers or APPs (Physician Assistants and Nurse Practitioners) who all work together to provide you with the care you need, when you need it.  Your next appointment:   6 month(s)  Provider:   Dr. Alvenia Aus  We recommend signing up for the patient portal called "MyChart".  Sign up information is provided on this After Visit Summary.  MyChart is used to connect with patients for Virtual Visits (Telemedicine).  Patients are able to view lab/test results, encounter notes, upcoming appointments, etc.  Non-urgent messages can be sent to your provider as well.   To learn more about what you can do with MyChart, go to [email or web address].   Other Instructions EXERCISE PROGRAM FOR INDIVIDUALS WITH  PERIPHERAL ARTERIAL DISEASE (PAD)   General Information:   Research in vascular exercise has demonstrated remarkable improvement in symptoms of leg pain (claudication) without expensive or invasive interventions. Regular walking programs are extremely helpful for patients with PAD and intermittent claudication.  These steps are designed to help you get started with a safe and effective program to help you walk farther with  less pain:   Walk at least three times a week (preferably every day).  Your goal is to build up to 30-45 minutes of total walking time (not counting rest breaks). It may take you several weeks to build up your exercise time starting at 5-10 minutes or whatever you can tolerate.  Walk as far as possible using moderate to maximal pain (7-8 on the scale below) as a signal to stop, and resume walking when the pain goes away.  On a treadmill, set the speed and grade at a level that brings on the claudication pain within 3 to 5 minutes. Walk at this rate until you experience claudication of moderate severity, rest until the pain improves, and then resume walking.  Over time, you will be able to walk longer at the designated speed and grade; workload should then be increased until you develop the pain within 3 to 5 minutes once again.  This regimen will induce a significant benefit. Studies have demonstrated that participants may be able to walk up to three or four times farther and have less leg pain, within twelve weeks, by following this protocol.  Pain Scale    0_____1_____2_____3_____4_____5_____6_____7_____8_____9_____10   No Pain                                   Moderate Pain                               Maximal Pain  Managing the Challenge  of Quitting Smoking Quitting smoking is a physical and mental challenge. You may have cravings, withdrawal symptoms, and temptation to smoke. Before quitting, work with your health care provider to make a plan that can help you manage quitting. Making a plan before you quit may keep you from smoking when you have the urge to smoke while trying to quit. How to manage lifestyle changes Managing stress Stress can make you want to smoke, and wanting to smoke may cause stress. It is important to find ways to manage your stress. You could try some of the following: Practice relaxation techniques. Breathe slowly and deeply, in through your nose and out through your  mouth. Listen to music. Soak in a bath or take a shower. Imagine a peaceful place or vacation. Get some support. Talk with family or friends about your stress. Join a support group. Talk with a counselor or therapist. Get some physical activity. Go for a walk, run, or bike ride. Play a favorite sport. Practice yoga.  Medicines Talk with your health care provider about medicines that might help you deal with cravings and make quitting easier for you. Relationships Social situations can be difficult when you are quitting smoking. To manage this, you can: Avoid parties and other social situations where people might be smoking. Avoid alcohol. Leave right away if you have the urge to smoke. Explain to your family and friends that you are quitting smoking. Ask for support and let them know you might be a bit grumpy. Plan activities where smoking is not an option. General instructions Be aware that many people gain weight after they quit smoking. However, not everyone does. To keep from gaining weight, have a plan in place before you quit, and stick to the plan after you quit. Your plan should include: Eating healthy snacks. When you have a craving, it may help to: Eat popcorn, or try carrots, celery, or other cut vegetables. Chew sugar-free gum. Changing how you eat. Eat small portion sizes at meals. Eat 4-6 small meals throughout the day instead of 1-2 large meals a day. Be mindful when you eat. You should avoid watching television or doing other things that might distract you as you eat. Exercising regularly. Make time to exercise each day. If you do not have time for a long workout, do short bouts of exercise for 5-10 minutes several times a day. Do some form of strengthening exercise, such as weight lifting. Do some exercise that gets your heart beating and causes you to breathe deeply, such as walking fast, running, swimming, or biking. This is very important. Drinking plenty of  water or other low-calorie or no-calorie drinks. Drink enough fluid to keep your urine pale yellow.  How to recognize withdrawal symptoms Your body and mind may experience discomfort as you try to get used to not having nicotine  in your system. These effects are called withdrawal symptoms. They may include: Feeling hungrier than normal. Having trouble concentrating. Feeling irritable or restless. Having trouble sleeping. Feeling depressed. Craving a cigarette. These symptoms may surprise you, but they are normal to have when quitting smoking. To manage withdrawal symptoms: Avoid places, people, and activities that trigger your cravings. Remember why you want to quit. Get plenty of sleep. Avoid coffee and other drinks that contain caffeine. These may worsen some of your symptoms. How to manage cravings Come up with a plan for how to deal with your cravings. The plan should include the following: A definition of the specific situation you want  to deal with. An activity or action you will take to replace smoking. A clear idea for how this action will help. The name of someone who could help you with this. Cravings usually last for 5-10 minutes. Consider taking the following actions to help you with your plan to deal with cravings: Keep your mouth busy. Chew sugar-free gum. Suck on hard candies or a straw. Brush your teeth. Keep your hands and body busy. Change to a different activity right away. Squeeze or play with a ball. Do an activity or a hobby, such as making bead jewelry, practicing needlepoint, or working with wood. Mix up your normal routine. Take a short exercise break. Go for a quick walk, or run up and down stairs. Focus on doing something kind or helpful for someone else. Call a friend or family member to talk during a craving. Join a support group. Contact a quitline. Where to find support To get help or find a support group: Call the National Cancer Institute's  Smoking Quitline: 1-800-QUIT-NOW (651) 776-0443) Text QUIT to SmokefreeTXT: 347425 Where to find more information Visit these websites to find more information on quitting smoking: U.S. Department of Health and Human Services: www.smokefree.gov American Lung Association: www.freedomfromsmoking.org Centers for Disease Control and Prevention (CDC): FootballExhibition.com.br American Heart Association: www.heart.org Contact a health care provider if: You want to change your plan for quitting. The medicines you are taking are not helping. Your eating feels out of control or you cannot sleep. You feel depressed or become very anxious. Summary Quitting smoking is a physical and mental challenge. You will face cravings, withdrawal symptoms, and temptation to smoke again. Preparation can help you as you go through these challenges. Try different techniques to manage stress, handle social situations, and prevent weight gain. You can deal with cravings by keeping your mouth busy (such as by chewing gum), keeping your hands and body busy, calling family or friends, or contacting a quitline for people who want to quit smoking. You can deal with withdrawal symptoms by avoiding places where people smoke, getting plenty of rest, and avoiding drinks that contain caffeine. This information is not intended to replace advice given to you by your health care provider. Make sure you discuss any questions you have with your health care provider. Document Revised: 07/13/2021 Document Reviewed: 07/13/2021 Elsevier Patient Education  2024 [employer].

## 2024-01-13 NOTE — Progress Notes (Signed)
 Cardiology Office Note   Date:  01/13/2024   ID:  Christopher, Gutierrez 04/28/1961, MRN [id number]  PCP:  Adela Holter, DO  Cardiologist: Dr. Alda Amas  No chief complaint on file.      History of Present Illness: Christopher Gutierrez is a 63 y.o. male who is here today for a follow-up visit regarding peripheral arterial disease.   He has known history of atrial fibrillation/flutter, essential hypertension, tobacco use, and chronic systolic heart failure with severely reduced LV systolic function. The patient was diagnosed with heart failure in March of 2022 in the setting of atrial fibrillation with rapid ventricular response.  Echocardiogram showed an EF of 20 to 25%.  He underwent successful TEE guided cardioversion.  Heart failure improved gradually with medical therapy with most recent ejection fraction 60 to 65% in May 2024.  He was seen in 2022 for severe left calf claudication with rest pain and ischemic changes in the toes. Angiography was done in August of 2022 which showed no significant aortoiliac disease, on the left, the distal SFA was occluded into the proximal popliteal artery with two-vessel runoff below the knee.  On the right side, there was severe focal stenosis in the distal SFA.  I performed successful angioplasty and 2 overlapped Biomimics self-expanding stents to the left SFA into the proximal popliteal artery. Postprocedure ABI improved to normal on the left side and was mildly reduced on the right side of 0.81.  Duplex showed normal velocities in the left SFA/popliteal artery stents.  He underwent recent lower extremity arterial Doppler which showed an ABI of 0.75 on the right and 0.72 on the left.  There was evidence of restenosis in the distal left SFA stent with peak velocity of 450.  He reports stable bilateral calf claudication with overexertion that is equal on both sides.  He has no symptoms at rest.  Unfortunately, he reports significant stress  due to death in the family recently and he started drinking excessively again.  In addition, he continues to smoke.  He wants to quit.  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   Hyperlipidemia    Hypertension     Past Surgical History:  Procedure Laterality Date   ABDOMINAL AORTOGRAM W/LOWER EXTREMITY N/A 03/07/2021   Procedure: ABDOMINAL AORTOGRAM W/LOWER EXTREMITY;  Surgeon: Wenona Hamilton, MD;  [facility];  Service: Cardiovascular;  Laterality: N/A;   ATRIAL FIBRILLATION ABLATION N/A 05/16/2022   Procedure: ATRIAL FIBRILLATION ABLATION;  Surgeon: Lei Pump, MD;  [facility]: [facility];  Service: Cardiovascular;  Laterality: N/A;   CARDIAC CATHETERIZATION     CARDIOVERSION N/A 11/01/2020   Procedure: CARDIOVERSION;  Surgeon: Hugh Madura, MD;  Location: [facility];  Service: Cardiovascular;  Laterality: N/A;   CATARACT EXTRACTION Right    CORONARY STENT INTERVENTION N/A 12/24/2021   Procedure: CORONARY STENT INTERVENTION;  Surgeon: Sammy Crisp, MD;  [facility];  Service: Cardiovascular;  Laterality: N/A;   LEFT HEART CATH AND CORONARY ANGIOGRAPHY N/A 12/24/2021   Procedure: LEFT HEART CATH AND CORONARY ANGIOGRAPHY;  Surgeon: Sammy Crisp, MD;  [facility];  Service: Cardiovascular;  Laterality: N/A;   MOUTH SURGERY  2013   PERIPHERAL VASCULAR INTERVENTION  Left 03/07/2021   Procedure: PERIPHERAL VASCULAR INTERVENTION;  Surgeon: Wenona Hamilton, MD;  [facility];  Service: Cardiovascular;  Laterality: Left;  left SFA   TEE WITHOUT CARDIOVERSION N/A 11/01/2020   Procedure: TRANSESOPHAGEAL ECHOCARDIOGRAM (TEE);  Surgeon: Hugh Madura, MD;  Location: [facility];  Service:  Cardiovascular;  Laterality: N/A;     Current Outpatient Medications  Medication Sig Dispense Refill   acetaminophen  (TYLENOL ) 500 MG tablet Take 500 mg by mouth every 6 (six) hours as needed for moderate pain.     bisoprolol  (ZEBETA ) 10 MG tablet TAKE 1 TABLET BY MOUTH EVERY DAY 90 tablet 2   cetirizine (ZYRTEC) 10 MG tablet Take 10 mg by mouth as needed for allergies.     dapagliflozin  propanediol (FARXIGA ) 10 MG TABS tablet TAKE 1 TABLET BY MOUTH DAILY BEFORE BREAKFAST. 90 tablet 1   ELIQUIS  5 MG TABS tablet TAKE 1 TABLET BY MOUTH TWICE A DAY 180 tablet 1   ezetimibe  (ZETIA ) 10 MG tablet TAKE 1 TABLET BY MOUTH EVERY DAY 90 tablet 2   famotidine -calcium  carbonate-magnesium  hydroxide (PEPCID  COMPLETE) 10-800-165 MG chewable tablet Chew 1 tablet by mouth daily as needed (acid reflux).     hydrALAZINE  (APRESOLINE ) 25 MG tablet Take 1 tablet (25 mg total) by mouth 2 (two) times daily. 180 tablet 2   Polyvinyl Alcohol-Povidone (REFRESH OP) Place 1 drop into both eyes daily as needed (dry eyes).     sacubitril -valsartan  (ENTRESTO ) 97-103 MG Take 1 tablet by mouth 2 (two) times daily. 60 tablet 9   spironolactone  (ALDACTONE ) 25 MG tablet Take 0.5 tablets (12.5 mg total) by mouth daily. 90 tablet 3   nitroGLYCERIN  (NITROSTAT ) 0.4 MG SL tablet Place 1 tablet (0.4 mg total) under the tongue every 5 (five) minutes as needed. (Patient not taking: Reported on 11/27/2023) 25 tablet 3   rosuvastatin  (CRESTOR ) 20 MG tablet Take 1 tablet (20 mg total) by mouth daily. 90 tablet 3   No current facility-administered medications for this visit.    Allergies:   Beta adrenergic blockers and Tape    Social History:  The patient  reports that he has been smoking cigarettes. He has a 37.5 pack-year smoking history. He quit smokeless tobacco use about 17 months ago.  His smokeless tobacco use included chew. He reports current alcohol use of about 24.0 standard drinks of alcohol per week. He reports current drug use.  Frequency: 2.00 times per week. Drug: Marijuana.   Family History:  The patient's family history includes Birth defects in his paternal aunt; COPD in his maternal uncle; Gout in his brother; Hypertension in his brother; Stroke in his paternal aunt.    ROS:  Please see the history of present illness.   Otherwise, review of systems are positive for .   All other systems are reviewed and negative.    PHYSICAL EXAM: VS:  BP 120/68 (BP Location: Left Arm, Patient Position: Sitting, Cuff Size: Normal)   Pulse 62   Ht 6\' 2"  (1.88 m)   Wt 215 lb (97.5 kg)   SpO2 98%   BMI 27.60 kg/m  , BMI Body mass index is 27.6 kg/m. GEN: Well nourished, well developed, in no acute distress  HEENT: normal  Neck: no JVD, carotid bruits, or masses Cardiac: RRR; no murmurs, rubs, or gallops,no edema  Respiratory:  clear to auscultation bilaterally, normal work of breathing GI: soft, nontender, nondistended, + BS MS: no deformity or atrophy  Skin: warm and dry, no rash Neuro:  Strength and sensation are intact Psych: euthymic mood, full affect He has faint +1 dorsalis pedis on the left.   EKG:  EKG is  ordered today. EKG showed: Normal sinus rhythm Septal infarct (cited on or before 14-Jun-2022) When compared with ECG of 01-Aug-2023 15:08, Premature ventricular complexes are no longer Present   Recent Labs: 07/22/2023: BUN 17; Creatinine, Ser 1.21; Magnesium  2.6; Potassium 5.4; Sodium 143 11/27/2023: Hemoglobin 18.4; Platelet Count 141    Lipid Panel    Component Value Date/Time   CHOL 142 07/22/2023 1532   TRIG 157 (H) 07/22/2023 1532   HDL 59 07/22/2023 1532   CHOLHDL 2.4 07/22/2023 1532   CHOLHDL 3.3 01/26/2021 1213   VLDL 21 01/26/2021 1213   LDLCALC 57 07/22/2023 1532      Wt Readings from Last 3 Encounters:  01/13/24 215 lb (97.5 kg)  11/27/23 215 lb 1.6 oz (97.6 kg)  08/01/23 210 lb (95.3 kg)            No data to display            ASSESSMENT AND PLAN:  1.   Peripheral arterial disease:   Status post  endovascular intervention on the left SFA and popliteal artery.  There is evidence of focal in-stent restenosis distally.  In addition, he has known right SFA disease.  He reports bilateral calf claudication that seems to be mild to moderate and not lifestyle limiting.  I discussed with him the rationale for intervening on the left SFA in-stent restenosis before the stents close.  However, it is preferable for him to quit smoking first.  I asked him to notify me if he has worsening left calf claudication.  Otherwise, I will see him back in 6 months.   2.  Chronic systolic heart failure with improved ejection fraction: He appears to be euvolemic without furosemide .  He is currently on bisoprolol , Farxiga , spironolactone  and Entresto .  3.  Persistent atrial fibrillation: He is maintaining in sinus rhythm and tolerating anticoagulation.  4.  Tobacco use: I again discussed with him the importance of smoking cessation.  5.  Hyperlipidemia: Continue treatment with rosuvastatin  with target LDL of less than 70.  I reviewed most recent lipid profile done in December 2024 which showed an LDL of 57.    Disposition: Follow-up with me in 6 months.  Signed,  Antionette Kirks, MD  01/13/2024 8:39 AM    [facility]

## 2024-01-25 NOTE — Progress Notes (Unsigned)
 Cardiology Office Note:    Date:  01/26/2024   ID:  Christopher Gutierrez, DOB July 09, 1961, MRN [id number]  PCP:  Alvia Bring, DO  Cardiologist:  Lonni LITTIE Nanas, MD  Electrophysiologist:  Soyla Gladis Norton, MD   Referring MD: Alvia Bring, DO   Chief Complaint  Patient presents with   CAD in native artery   Chronic combined systolic and diastolic congestive heart fa   Follow-up    6 months    History of Present Illness:    Christopher Gutierrez is a 63 y.o. male with a hx of HTN, tobacco use, alcohol use who presents for follow-up.  He was referred by Dr. Alvia for evaluation of atrial fibrillation.  At initial clinic visit on 10/27/20, he appeared hypervolemic on exam and was in A. fib with rates up to 160s.  Discussed admission to the hospital for diuresis, rate control of his A. fib and eventual TEE/DCCV, but he declined.  At follow-up visit on 10/30/2020 he appeared more volume overloaded on direct admission was arranged.  Echo showed EF 20 to 25%, reduced RV function.  He had good response to IV diuretics and underwent successful TEE/DCCV on 11/01/2020 with restoration of sinus rhythm.  Initially followed with Dr. Bensimhon in advanced heart failure after his hospitalization.  He was started on Entresto , spironolactone , and bisoprolol .  Repeat echocardiogram 01/26/2021 showed significant improvement in EF to 45 to 50%, and normal RV function.  Coronary CTA was done on 12/04/2021, which showed obstructive CAD in mid RCA.  LHC 12/24/2021 showed severe mid RCA stenosis status post DES.  Echocardiogram 12/2022 showed EF 60 to 65%, grade 1 diastolic dysfunction, normal RV function, no significant valvular disease  Since last clinic visit, he reports he is doing okay.  Denies any chest pain,  lower extremity edema, or palpitations.  He reports he is having dyspnea recently in the heat.  Reports some lightheadedness especially with standing quickly, denies any syncope.  He denies any  bleeding on Eliquis .  He is smoking about 0.5 packs/day.  He is drinking 6 beers about 4 days/week.  Has not been exercising.   Wt Readings from Last 3 Encounters:  01/13/24 215 lb (97.5 kg)  11/27/23 215 lb 1.6 oz (97.6 kg)  08/01/23 210 lb (95.3 kg)   BP Readings from Last 3 Encounters:  01/26/24 107/67  01/13/24 120/68  11/27/23 110/85     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   Hyperlipidemia    Hypertension     Past Surgical History:  Procedure Laterality Date   ABDOMINAL AORTOGRAM W/LOWER EXTREMITY N/A 03/07/2021   Procedure: ABDOMINAL AORTOGRAM W/LOWER EXTREMITY;  Surgeon: Darron Deatrice LABOR, MD;  [facility];  Service: Cardiovascular;  Laterality: N/A;   ATRIAL FIBRILLATION ABLATION N/A 05/16/2022   Procedure: ATRIAL FIBRILLATION ABLATION;  Surgeon: Norton Soyla Gladis, MD;  [facility];  Service: Cardiovascular;  Laterality: N/A;   CARDIAC CATHETERIZATION     CARDIOVERSION N/A 11/01/2020   Procedure: CARDIOVERSION;  Surgeon: Jeffrie Oneil BROCKS, MD;  [facility]: [facility];  Service: Cardiovascular;  Laterality: N/A;   CATARACT EXTRACTION Right    CORONARY STENT INTERVENTION N/A 12/24/2021   Procedure: CORONARY STENT INTERVENTION;  Surgeon: Mady Lonni, MD;  [facility];  Service: Cardiovascular;  Laterality: N/A;   LEFT HEART CATH AND CORONARY ANGIOGRAPHY N/A 12/24/2021   Procedure: LEFT HEART CATH AND CORONARY ANGIOGRAPHY;  Surgeon: Mady Bruckner, MD;  [facility];  Service: Cardiovascular;  Laterality: N/A;   MOUTH SURGERY  2013   PERIPHERAL VASCULAR INTERVENTION Left 03/07/2021   Procedure: PERIPHERAL VASCULAR INTERVENTION;  Surgeon: Darron Deatrice LABOR, MD;  [facility];  Service: Cardiovascular;  Laterality: Left;  left SFA   TEE WITHOUT CARDIOVERSION N/A 11/01/2020   Procedure: TRANSESOPHAGEAL ECHOCARDIOGRAM (TEE);  Surgeon: Jeffrie Oneil BROCKS, MD;  Location[facility] ENDOSCOPY;  Service: Cardiovascular;  Laterality: N/A;    Current Medications: Current Meds  Medication Sig   acetaminophen  (TYLENOL ) 500 MG tablet Take 500 mg by mouth every 6 (six) hours as needed for moderate pain.   bisoprolol  (ZEBETA ) 10 MG tablet TAKE 1 TABLET BY MOUTH EVERY DAY   cetirizine (ZYRTEC) 10 MG tablet Take 10 mg by mouth as needed for allergies.   dapagliflozin  propanediol (FARXIGA ) 10 MG TABS tablet TAKE 1 TABLET BY MOUTH DAILY BEFORE BREAKFAST.   ELIQUIS  5 MG TABS tablet TAKE 1 TABLET BY MOUTH TWICE A DAY   ezetimibe  (ZETIA ) 10 MG tablet TAKE 1 TABLET BY MOUTH EVERY DAY   famotidine -calcium  carbonate-magnesium  hydroxide (PEPCID  COMPLETE) 10-800-165 MG chewable tablet Chew 1 tablet by mouth daily as needed (acid reflux).   hydrALAZINE  (APRESOLINE ) 25 MG tablet Take 1 tablet (25 mg total) by mouth 2 (two) times daily.   nitroGLYCERIN  (NITROSTAT ) 0.4 MG SL tablet Place 1 tablet (0.4 mg total) under the tongue every 5 (five) minutes as needed.   Polyvinyl Alcohol-Povidone (REFRESH OP) Place 1 drop into both eyes daily as needed (dry eyes).   rosuvastatin  (CRESTOR ) 20 MG tablet Take 1 tablet (20 mg total) by mouth daily.   sacubitril -valsartan  (ENTRESTO ) 97-103 MG Take 1 tablet by mouth 2 (two) times daily.   spironolactone  (ALDACTONE ) 25 MG tablet Take 0.5 tablets (12.5 mg total) by mouth daily.     Allergies:   Beta adrenergic blockers and Tape   Social History   Socioeconomic History   Marital status: Divorced    Spouse name: Not on file   Number of children: 1   Years of education: Not on file   Highest education level: Master's degree (e.g., MA, MS, MEng, MEd, MSW, MBA)  Occupational History   Occupation: unemployed  Tobacco Use   Smoking status: Every Day     Current packs/day: 1.50    Average packs/day: 1.5 packs/day for 25.0 years (37.5 ttl pk-yrs)    Types: Cigarettes   Smokeless tobacco: Former    Types: Chew    Quit date: 08/2022   Tobacco comments:    07/18/22 Smokes 1/2 ppd not ready to quit given smoking cessation information  Vaping Use   Vaping status: Never Used  Substance and Sexual Activity   Alcohol use: Yes    Alcohol/week: 24.0 standard drinks of alcohol    Types: 24 Cans of beer per week    Comment: 6 cans every other day 05/31/22   Drug use: Not Currently    Frequency: 2.0 times per week    Types: Marijuana    Comment: smokes marijuana ocass   Sexual activity: Not on file  Other Topics Concern   Not on file  Social History Narrative   Not on file   Social Drivers of Health   Financial Resource Strain: Medium Risk (  11/25/2022)   Overall Financial Resource Strain (CARDIA)    Difficulty of Paying Living Expenses: Somewhat hard  Food Insecurity: No Food Insecurity (11/25/2022)   Hunger Vital Sign    Worried About Running Out of Food in the Last Year: Never true    Ran Out of Food in the Last Year: Never true  Transportation Needs: No Transportation Needs (11/25/2022)   PRAPARE - Administrator, Civil Service (Medical): No    Lack of Transportation (Non-Medical): No  Physical Activity: Not on file  Stress: Not on file  Social Connections: Not on file     Family History: The patient's family history includes Birth defects in his paternal aunt; COPD in his maternal uncle; Gout in his brother; Hypertension in his brother; Stroke in his paternal aunt.  ROS:   Please see the history of present illness.     All other systems reviewed and are negative.  EKGs/Labs/Other Studies Reviewed:    The following studies were reviewed today:   EKG:   11/19/22: Normal sinus rhythm, rate 73, PVC, Q waves in V1/2 07/23/2022: Normal sinus rhythm, rate 64, Q waves in V1/2 04/25/2022: Normal sinus rhythm, rate 67, Q  waves in V1/2 12/13/2021: Sinus rhythm, rate 71, PVC, poor R wave progression 01/26/2024: Sinus rhythm, rate 69, PACs, low voltage  Recent Labs: 07/22/2023: BUN 17; Creatinine, Ser 1.21; Magnesium  2.6; Potassium 5.4; Sodium 143 11/27/2023: Hemoglobin 18.4; Platelet Count 141  Recent Lipid Panel    Component Value Date/Time   CHOL 142 07/22/2023 1532   TRIG 157 (H) 07/22/2023 1532   HDL 59 07/22/2023 1532   CHOLHDL 2.4 07/22/2023 1532   CHOLHDL 3.3 01/26/2021 1213   VLDL 21 01/26/2021 1213   LDLCALC 57 07/22/2023 1532    Physical Exam:    VS:  BP 107/67 (BP Location: Left Arm, Patient Position: Sitting, Cuff Size: Normal)   Pulse 66   Resp 16   SpO2 96%     Wt Readings from Last 3 Encounters:  01/13/24 215 lb (97.5 kg)  11/27/23 215 lb 1.6 oz (97.6 kg)  08/01/23 210 lb (95.3 kg)     GEN:  in no acute distress HEENT: Normal NECK: No JVD CARDIAC: RRR no murmurs RESPIRATORY:  Clear to auscultation without rales, wheezing or rhonchi  ABDOMEN: Soft, non-tender, non-distended MUSCULOSKELETAL: No edema; No deformity  SKIN: Warm and dry NEUROLOGIC:  Alert and oriented x 3 PSYCHIATRIC:  Normal affect   ASSESSMENT:    1. CAD in native artery   2. Essential hypertension   3. Chronic combined systolic and diastolic heart failure (HCC)   4. Persistent atrial fibrillation (HCC)   5. Tobacco use   6. PAD (peripheral artery disease) (HCC)   7. Alcohol abuse      PLAN:    CAD: Reported exertional chest pain, coronary CTA was done on 12/04/2021, which showed obstructive CAD in mid RCA  [facility] 12/24/2021 showed severe mid RCA stenosis status post DES. -Continue Eliquis  -Completed 1 year of Plavix  after his PCI, subsequently switched to aspirin  81 mg daily -Continue statin  -Continue bisoprol  Chronic combined systolic and diastolic heart failure: New diagnosis in the setting of afib w/rvr, alcohol use and heavy smoking history. 10/2020 EF 20%, BIV failure. ECHO 01/26/21 with  significant improvement in EF 45-50%, normal RV function.  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  Echocardiogram 12/2022 showed EF 60 to 65%, grade 1 diastolic dysfunction, normal RV function, no significant valvular disease -NYHA Class II symptoms,  euvolemic on exam -continue spironolactone  25 mg daily.   -continue entresto  97/103 -continue bisoprolol  10 mg daily -Continue Farxiga  10 mg daily -Continue hydralazine  25 mg twice daily -Check BMET, Mag   Persistent Afib: new diagnosis 10/2020. S/p TEE/DCCV 11/01/20.  Underwent cardioversion in ED for atrial flutter 08/2021.  He was referred to EP and underwent ablation with Dr. Inocencio 05/2022.  He underwent DCCV in ED 05/31/2022 and started on amiodarone . -Amiodarone  discontinued 08/2022.  Appears to be maintaining sinus rhythm -CHADS2VASC 3, continue eliquis  -continue bisoprolol   -Recommended sleep study but patient declines at this time  Tobacco Use: Counseled on the risks of tobacco use and cessation strongly encouraged.   Polycythemia: Likely secondary to smoking. Hematology following. Jak 2 negative   Alcohol Use: Had cut back on alcohol but now drinking again, states that he will drink 4 days/week and will drink 6 beers when he drinks.  Encouraged cessation  Hypertension: Continue Entresto , bisoprolol , hydralazine , and spironolactone  as above  PAD: Reported severe left calf claudication with rest pain and ischemic changes in toes.  Angiography 03/2021 showed no significant aortoiliac disease on the left with distal SFA occlusion into the proximal popliteal artery, severe focal stenosis in distal right SFA.  Follows with Dr. Darron, underwent successful angioplasty and stent placement to left SFA into proximal popliteal artery.  Postprocedure ABIs showed improvement with normal ABIs on left, mild disease (0.81) on right -Continue Eliquis  -Continue rosuvastatin  -Smoking cessation recommended  Hyperlipidemia: LDL 95 on 11/22/2021, on  rosuvastatin  20 mg daily.  Rosuvastatin  increased to 40 mg daily but he reports he is only taking 20 mg daily.  LDL remain 95 on 01/16/2022, Zetia  10 mg daily added.  LDL 57 on 07/22/2023  Daytime somnolence: Itamar sleep study was ordered and approved but he never wore device.  He is now agreeable to wearing  RTC in 6 months    Medication Adjustments/Labs and Tests Ordered: Current medicines are reviewed at length with the patient today.  Concerns regarding medicines are outlined above.  Orders Placed This Encounter  Procedures   Basic Metabolic Panel (BMET)   Magnesium    EKG 12-Lead   No orders of the defined types were placed in this encounter.   Patient Instructions  Medication Instructions:  Continue current medication *If you need a refill on your cardiac medications before your next appointment, please call your pharmacy*  Lab Work: Bmet, mg today If you have labs (blood work) drawn today and your tests are completely normal, you will receive your results only by: MyChart Message (if you have MyChart) OR A paper copy in the mail If you have any lab test that is abnormal or we need to change your treatment, we will call you to review the results.  Testing/Procedures: none  Follow-Up: At [facility], you and your health needs are our priority.  As part of our continuing mission to provide you with exceptional heart care, our providers are all part of one team.  This team includes your primary Cardiologist (physician) and Advanced Practice Providers or APPs (Physician Assistants and Nurse Practitioners) who all work together to provide you with the care you need, when you need it.  Your next appointment:   6 month(s)  Provider:   Lonni LITTIE Nanas, MD    We recommend signing up for the patient portal called MyChart.  Sign up information is provided on this After Visit Summary.  MyChart is used to connect with patients for Virtual Visits (Telemedicine).  Patients are able to view lab/test results, encounter notes, upcoming appointments, etc.  Non-urgent messages can be sent to your provider as well.   To learn more about what you can do with MyChart, go to [email or web address].   Other Instructions none       Signed, Lonni LITTIE Nanas, MD  01/26/2024 5:31 PM    [facility]

## 2024-01-26 ENCOUNTER — Encounter: Payer: Self-pay | Admitting: Cardiology

## 2024-01-26 ENCOUNTER — Ambulatory Visit: Payer: Commercial Managed Care - PPO | Attending: Cardiology | Admitting: Cardiology

## 2024-01-26 VITALS — BP 107/67 | HR 66 | Resp 16

## 2024-01-26 DIAGNOSIS — F1010 Alcohol abuse, uncomplicated: Secondary | ICD-10-CM | POA: Insufficient documentation

## 2024-01-26 DIAGNOSIS — I10 Essential (primary) hypertension: Secondary | ICD-10-CM | POA: Insufficient documentation

## 2024-01-26 DIAGNOSIS — I739 Peripheral vascular disease, unspecified: Secondary | ICD-10-CM | POA: Insufficient documentation

## 2024-01-26 DIAGNOSIS — I2510 Atherosclerotic heart disease of native coronary artery without angina pectoris: Secondary | ICD-10-CM | POA: Insufficient documentation

## 2024-01-26 DIAGNOSIS — I4819 Other persistent atrial fibrillation: Secondary | ICD-10-CM | POA: Insufficient documentation

## 2024-01-26 DIAGNOSIS — I5042 Chronic combined systolic (congestive) and diastolic (congestive) heart failure: Secondary | ICD-10-CM | POA: Insufficient documentation

## 2024-01-26 DIAGNOSIS — Z720 Tobacco use: Secondary | ICD-10-CM | POA: Diagnosis present

## 2024-01-26 NOTE — Patient Instructions (Signed)
 Medication Instructions:  Continue current medication *If you need a refill on your cardiac medications before your next appointment, please call your pharmacy*  Lab Work: Bmet, mg today If you have labs (blood work) drawn today and your tests are completely normal, you will receive your results only by: MyChart Message (if you have MyChart) OR A paper copy in the mail If you have any lab test that is abnormal or we need to change your treatment, we will call you to review the results.  Testing/Procedures: none  Follow-Up: At [facility], you and your health needs are our priority.  As part of our continuing mission to provide you with exceptional heart care, our providers are all part of one team.  This team includes your primary Cardiologist (physician) and Advanced Practice Providers or APPs (Physician Assistants and Nurse Practitioners) who all work together to provide you with the care you need, when you need it.  Your next appointment:   6 month(s)  Provider:   Wendie Hamburg, MD    We recommend signing up for the patient portal called "MyChart".  Sign up information is provided on this After Visit Summary.  MyChart is used to connect with patients for Virtual Visits (Telemedicine).  Patients are able to view lab/test results, encounter notes, upcoming appointments, etc.  Non-urgent messages can be sent to your provider as well.   To learn more about what you can do with MyChart, go to [email or web address].   Other Instructions none

## 2024-01-27 ENCOUNTER — Ambulatory Visit: Payer: Self-pay | Admitting: Cardiology

## 2024-01-27 LAB — BASIC METABOLIC PANEL WITH GFR
BUN/Creatinine Ratio: 11 (ref 10–24)
BUN: 12 mg/dL (ref 8–27)
CO2: 21 mmol/L (ref 20–29)
Calcium: 9.2 mg/dL (ref 8.6–10.2)
Chloride: 102 mmol/L (ref 96–106)
Creatinine, Ser: 1.07 mg/dL (ref 0.76–1.27)
Glucose: 96 mg/dL (ref 70–99)
Potassium: 4.8 mmol/L (ref 3.5–5.2)
Sodium: 138 mmol/L (ref 134–144)
eGFR: 79 mL/min/{1.73_m2} (ref >59)

## 2024-01-27 LAB — MAGNESIUM: Magnesium: 2.2 mg/dL (ref 1.6–2.3)

## 2024-01-28 ENCOUNTER — Telehealth: Payer: Self-pay | Admitting: *Deleted

## 2024-01-28 NOTE — Telephone Encounter (Signed)
-----   Message from Nurse Connell SAUNDERS sent at 01/28/2024  2:27 PM EDT ----- Regarding: RE: WATCHPAT AUTH / PIN See the 3/18 note from Macario, also, Emily left him a MyChart msg to bring device back on next appointment. ----- Message ----- From: Trudy Frankey SAUNDERS, RN Sent: 01/26/2024   2:49 PM EDT To: Avelina HERO Via, LPN; Connell JAYSON Boers, LPN Subject: ORMAN BARROWS / PIN                            Patient has watchpat and needs a PIN. Please give patient a call.   Thanks

## 2024-01-28 NOTE — Telephone Encounter (Signed)
 Called patient and patient verbalized one day next week he will either bring WatchPat back or pay the $100 fee if watch is not returned. Advise patient to call office if not able to come next week. Understanding verbalized.

## 2024-02-02 ENCOUNTER — Ambulatory Visit (HOSPITAL_COMMUNITY): Payer: Commercial Managed Care - PPO | Admitting: Physician Assistant

## 2024-03-18 ENCOUNTER — Other Ambulatory Visit: Payer: Self-pay | Admitting: Cardiology

## 2024-03-18 DIAGNOSIS — I4891 Unspecified atrial fibrillation: Secondary | ICD-10-CM

## 2024-03-19 NOTE — Telephone Encounter (Signed)
 Prescription refill request for Eliquis received. Indication:afib Last office visit:6/25 Scr:1.07  6/25 Age: 63 Weight:97.5  kg  Prescription refilled

## 2024-05-10 ENCOUNTER — Other Ambulatory Visit: Payer: Self-pay | Admitting: Cardiology

## 2024-05-19 ENCOUNTER — Other Ambulatory Visit: Payer: Self-pay | Admitting: Cardiology

## 2024-08-16 ENCOUNTER — Other Ambulatory Visit: Payer: Self-pay

## 2024-08-16 MED ORDER — EZETIMIBE 10 MG PO TABS: 10.0000 mg | ORAL_TABLET | Freq: Every day | ORAL | 1 refills | Status: AC

## 2024-08-17 ENCOUNTER — Encounter: Payer: Self-pay | Admitting: Cardiovascular Disease

## 2024-08-17 ENCOUNTER — Ambulatory Visit: Admitting: Cardiovascular Disease

## 2024-08-17 VITALS — BP 116/66 | HR 60 | Ht 74.0 in | Wt 218.0 lb

## 2024-08-17 DIAGNOSIS — Z720 Tobacco use: Secondary | ICD-10-CM | POA: Diagnosis present

## 2024-08-17 DIAGNOSIS — I10 Essential (primary) hypertension: Secondary | ICD-10-CM | POA: Insufficient documentation

## 2024-08-17 DIAGNOSIS — E785 Hyperlipidemia, unspecified: Secondary | ICD-10-CM | POA: Diagnosis not present

## 2024-08-17 DIAGNOSIS — I739 Peripheral vascular disease, unspecified: Secondary | ICD-10-CM | POA: Diagnosis present

## 2024-08-17 DIAGNOSIS — I5022 Chronic systolic (congestive) heart failure: Secondary | ICD-10-CM | POA: Insufficient documentation

## 2024-08-17 DIAGNOSIS — I4819 Other persistent atrial fibrillation: Secondary | ICD-10-CM | POA: Insufficient documentation

## 2024-08-17 NOTE — Patient Instructions (Signed)
 Medication Instructions:  No changes *If you need a refill on your cardiac medications before your next appointment, please call your pharmacy*  Lab Work: None ordered If you have labs (blood work) drawn today and your tests are completely normal, you will receive your results only by: MyChart Message (if you have MyChart) OR A paper copy in the mail If you have any lab test that is abnormal or we need to change your treatment, we will call you to review the results.  Testing/Procedures: Your physician has requested that you have a lower extremity arterial duplex in April. During this test, ultrasound is used to evaluate arterial blood flow in the legs. Allow one hour for this exam. There are no restrictions or special instructions. This will take place at [address] [facility], 4th floor  Your physician has requested that you have an ankle brachial index (ABI) in April. During this test an ultrasound and blood pressure cuff are used to evaluate the arteries that supply the arms and legs with blood. Allow thirty minutes for this exam. There are no restrictions or special instructions. This will take place at [address] 4th floor   Please note: We ask at that you not bring children with you during ultrasound (echo/ vascular) testing. Due to room size and safety concerns, children are not allowed in the ultrasound rooms during exams. Our front office staff cannot provide observation of children in our lobby area while testing is being conducted. An adult accompanying a patient to their appointment will only be allowed in the ultrasound room at the discretion of the ultrasound technician under special circumstances. We apologize for any inconvenience.   Follow-Up: At [facility], you and your health needs are our priority.  As part of our continuing mission to provide you with exceptional heart care, our providers are all part of one team.  This team includes your primary Cardiologist  (physician) and Advanced Practice Providers or APPs (Physician Assistants and Nurse Practitioners) who all work together to provide you with the care you need, when you need it.  Your next appointment:   6 month(s)  Provider:   Dr. Darron  We recommend signing up for the patient portal called MyChart.  Sign up information is provided on this After Visit Summary.  MyChart is used to connect with patients for Virtual Visits (Telemedicine).  Patients are able to view lab/test results, encounter notes, upcoming appointments, etc.  Non-urgent messages can be sent to your provider as well.   To learn more about what you can do with MyChart, go to [email or web address].   Other Instructions

## 2024-08-17 NOTE — Progress Notes (Signed)
 "    Cardiology Office Note   Date:  08/17/2024   ID:  Christopher, Gutierrez 1960/12/06, MRN [id number]  PCP:  Alvia Bring, DO  Cardiologist: Dr. Kate  No chief complaint on file.      History of Present Illness: Christopher Gutierrez is a 64 y.o. male who is here today for a follow-up visit regarding peripheral arterial disease.   He has known history of atrial fibrillation/flutter, essential hypertension, tobacco use, and chronic systolic heart failure with severely reduced LV systolic function. The patient was diagnosed with heart failure in March of 2022 in the setting of atrial fibrillation with rapid ventricular response.  Echocardiogram showed an EF of 20 to 25%.  He underwent successful TEE guided cardioversion.  Heart failure improved gradually with medical therapy with most recent ejection fraction 60 to 65% in May 2024.  He was seen in 2022 for severe left calf claudication with rest pain and ischemic changes in the toes. Angiography was done in August of 2022 which showed no significant aortoiliac disease, on the left, the distal SFA was occluded into the proximal popliteal artery with two-vessel runoff below the knee.  On the right side, there was severe focal stenosis in the distal SFA.  I performed successful angioplasty and 2 overlapped Biom[employer]ics self-expanding stents to the left SFA into the proximal popliteal artery. Postprocedure ABI improved to normal on the left side and was mildly reduced on the right side of 0.81.  Duplex showed normal velocities in the left SFA/popliteal artery stents.  Most recent lower extremity arterial Doppler in April 2024 showed an ABI of 0.75 on the right and 0.72 on the left.  There was evidence of restenosis in the distal left SFA stent with peak velocity of 450.  He has been doing reasonably well and denies chest pain or worsening dyspnea.  He reports stable bilateral calf claudication which is not severe according to  him.  Unfortunately, he continues to smoke and continues to drink excessively.  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   Hyperlipidemia    Hypertension     Past Surgical History:  Procedure Laterality Date   ABDOMINAL AORTOGRAM W/LOWER EXTREMITY N/A 03/07/2021   Procedure: ABDOMINAL AORTOGRAM W/LOWER EXTREMITY;  Surgeon: Darron Deatrice LABOR, MD;  Location: [facility] INVASIVE CV LAB;  Service: Cardiovascular;  Laterality: N/A;   ATRIAL FIBRILLATION ABLATION N/A 05/16/2022   Procedure: ATRIAL FIBRILLATION ABLATION;  Surgeon: Inocencio Soyla Lunger, MD;  Location: [facility] CV [facility];  Service: Cardiovascular;  Laterality: N/A;   CARDIAC CATHETERIZATION     CARDIOVERSION N/A 11/01/2020   Procedure: CARDIOVERSION;  Surgeon: Jeffrie Oneil BROCKS, MD;  Location: [facility] ENDOSCOPY;  Service: Cardiovascular;  Laterality: N/A;   CATARACT EXTRACTION Right    CORONARY STENT INTERVENTION N/A 12/24/2021   Procedure: CORONARY STENT INTERVENTION;  Surgeon: Mady Bruckner, MD;  Location: [facility] CV [facility];  Service: Cardiovascular;  Laterality: N/A;   LEFT HEART CATH AND CORONARY ANGIOGRAPHY N/A 12/24/2021   Procedure: LEFT HEART CATH AND CORONARY ANGIOGRAPHY;  Surgeon: Mady Bruckner, MD;  Location: [facility];  Service: Cardiovascular;  Laterality: N/A;   MOUTH SURGERY  2013   PERIPHERAL VASCULAR INTERVENTION Left 03/07/2021   Procedure: PERIPHERAL VASCULAR INTERVENTION;  Surgeon: Darron,  Deatrice LABOR, MD;  Location: [facility];  Service: Cardiovascular;  Laterality: Left;  left SFA   TEE WITHOUT CARDIOVERSION N/A 11/01/2020   Procedure: TRANSESOPHAGEAL ECHOCARDIOGRAM (TEE);  Surgeon: Jeffrie Oneil BROCKS, MD;  Location: [facility] ENDOSCOPY;  Service: Cardiovascular;  Laterality: N/A;     Current  Outpatient Medications  Medication Sig Dispense Refill   acetaminophen  (TYLENOL ) 500 MG tablet Take 500 mg by mouth every 6 (six) hours as needed for moderate pain.     bisoprolol  (ZEBETA ) 10 MG tablet TAKE 1 TABLET BY MOUTH EVERY DAY 90 tablet 2   cetirizine (ZYRTEC) 10 MG tablet Take 10 mg by mouth as needed for allergies.     dapagliflozin  propanediol (FARXIGA ) 10 MG TABS tablet TAKE 1 TABLET BY MOUTH DAILY BEFORE BREAKFAST. 90 tablet 2   ELIQUIS  5 MG TABS tablet TAKE 1 TABLET BY MOUTH TWICE A DAY 180 tablet 1   ENTRESTO  97-103 MG TAKE 1 TABLET BY MOUTH TWICE A DAY 60 tablet 9   ezetimibe  (ZETIA ) 10 MG tablet Take 1 tablet (10 mg total) by mouth daily. 90 tablet 1   famotidine -calcium  carbonate-magnesium  hydroxide (PEPCID  COMPLETE) 10-800-165 MG chewable tablet Chew 1 tablet by mouth daily as needed (acid reflux).     hydrALAZINE  (APRESOLINE ) 25 MG tablet Take 1 tablet (25 mg total) by mouth 2 (two) times daily. 180 tablet 2   nitroGLYCERIN  (NITROSTAT ) 0.4 MG SL tablet Place 1 tablet (0.4 mg total) under the tongue every 5 (five) minutes as needed. 25 tablet 3   Polyvinyl Alcohol-Povidone (REFRESH OP) Place 1 drop into both eyes daily as needed (dry eyes).     rosuvastatin  (CRESTOR ) 20 MG tablet Take 1 tablet (20 mg total) by mouth daily. 90 tablet 3   spironolactone  (ALDACTONE ) 25 MG tablet TAKE 1 TABLET (25 MG TOTAL) BY MOUTH DAILY. (Patient taking differently: Take 12.5 mg by mouth daily.) 90 tablet 2   No current facility-administered medications for this visit.    Allergies:   Beta adrenergic blockers and Tape    Social History:  The patient  reports that he has been smoking cigarettes. He has a 37.5 pack-year smoking history. He quit smokeless tobacco use about 2 years ago.  His smokeless tobacco use included chew. He reports current alcohol use of about 24.0 standard drinks of alcohol per week. He reports that he does not currently use drugs after having used the following drugs:  Marijuana. Frequency: 2.00 times per week.   Family History:  The patient's family history includes Birth defects in his paternal aunt; COPD in his maternal uncle; Gout in his brother; Hypertension in his brother; Stroke in his paternal aunt.    ROS:  Please see the history of present illness.   Otherwise, review of systems are positive for .   All other systems are reviewed and negative.    PHYSICAL EXAM: VS:  BP 116/66 (BP Location: Right Arm, Patient Position: Sitting, Cuff Size: Normal)   Pulse 60   Ht 6' 2 (1.88 m)   Wt 218 lb (98.9 kg)   SpO2 97%   BMI 27.99 kg/m  , BMI Body mass index is 27.99 kg/m. GEN: Well nourished, well developed, in no acute distress  HEENT: normal  Neck: no JVD, carotid bruits, or masses Cardiac: RRR; no murmurs, rubs, or gallops,no edema  Respiratory:  clear to auscultation bilaterally, normal work of breathing GI: soft, nontender, nondistended, + BS MS: no deformity or atrophy  Skin: warm and dry, no rash  Neuro:  Strength and sensation are intact Psych: euthymic mood, full affect He has faint +1 dorsalis pedis on the left.   EKG:  EKG is  ordered today. EKG showed: Normal sinus rhythm Septal infarct (cited on or before 14-Jun-2022) When compared with ECG of 26-Jan-2024 13:42, Premature atrial complexes are no longer Present   Recent Labs: 11/27/2023: Hemoglobin 18.4; Platelet Count 141 01/26/2024: BUN 12; Creatinine, Ser 1.07; Magnesium  2.2; Potassium 4.8; Sodium 138    Lipid Panel    Component Value Date/Time   CHOL 142 07/22/2023 1532   TRIG 157 (H) 07/22/2023 1532   HDL 59 07/22/2023 1532   CHOLHDL 2.4 07/22/2023 1532   CHOLHDL 3.3 01/26/2021 1213   VLDL 21 01/26/2021 1213   LDLCALC 57 07/22/2023 1532      Wt Readings from Last 3 Encounters:  08/17/24 218 lb (98.9 kg)  01/13/24 215 lb (97.5 kg)  11/27/23 215 lb 1.6 oz (97.6 kg)            No data to display            ASSESSMENT AND PLAN:  1.  Peripheral  arterial disease:   Status post  endovascular intervention on the left SFA and popliteal artery.  There is evidence of focal in-stent restenosis distally.  In addition, he has known right SFA disease.  He reports bilateral calf claudication that seems to be mild to moderate and not lifestyle limiting.  I recommend continuing medical therapy for now.  Repeat Doppler studies in April of this year.  2.  Chronic systolic heart failure with improved ejection fraction: He appears to be euvolemic without furosemide .  He is currently on bisoprolol , Farxiga , spironolactone  and Entresto .  3.  Persistent atrial fibrillation: He is maintaining in sinus rhythm and tolerating anticoagulation.  4.  Tobacco use: I again discussed with him the importance of smoking cessation.  5.  Hyperlipidemia: Continue treatment with rosuvastatin  with target LDL of less than 70.  I reviewed most recent lipid profile done in December 2024 which showed an LDL of 57.  Overall, I had a prolonged discussion with him about his unhealthy lifestyle and we discussed ways to improve that.    Disposition: Follow-up with me in 6 months.  Signed,  Deatrice Cage, MD  08/17/2024 9:33 AM    [facility]  "

## 2024-08-26 ENCOUNTER — Other Ambulatory Visit: Payer: Self-pay | Admitting: Cardiology

## 2024-09-01 MED ORDER — BISOPROLOL FUMARATE 10 MG PO TABS: 10.0000 mg | ORAL_TABLET | Freq: Every day | ORAL | 3 refills | Status: AC

## 2024-09-01 NOTE — Telephone Encounter (Signed)
 Patient had BMET and Mg done on 01/26/24 that were normal.

## 2024-09-01 NOTE — Telephone Encounter (Signed)
 Labs on 11/27/23 Outside of Normal Range  In accordance with refill protocols, please review and address the following requirements before this medication refill can be authorized:  Labs

## 2024-11-25 ENCOUNTER — Inpatient Hospital Stay

## 2024-11-25 ENCOUNTER — Ambulatory Visit: Admitting: Oncology
